# Patient Record
Sex: Female | Born: 1935
Health system: Southern US, Community
[De-identification: ages and names within clinical notes are randomized; demographics above are authoritative.]

## PROBLEM LIST (undated history)

## (undated) DIAGNOSIS — L0390 Cellulitis, unspecified: Secondary | ICD-10-CM

## (undated) DIAGNOSIS — A419 Sepsis, unspecified organism: Secondary | ICD-10-CM

## (undated) DIAGNOSIS — L0291 Cutaneous abscess, unspecified: Secondary | ICD-10-CM

## (undated) DIAGNOSIS — E039 Hypothyroidism, unspecified: Secondary | ICD-10-CM

## (undated) DIAGNOSIS — U071 COVID-19: Secondary | ICD-10-CM

## (undated) DIAGNOSIS — J449 Chronic obstructive pulmonary disease, unspecified: Secondary | ICD-10-CM

## (undated) DIAGNOSIS — H3530 Unspecified macular degeneration: Secondary | ICD-10-CM

## (undated) DIAGNOSIS — I2510 Atherosclerotic heart disease of native coronary artery without angina pectoris: Secondary | ICD-10-CM

## (undated) DIAGNOSIS — I509 Heart failure, unspecified: Secondary | ICD-10-CM

## (undated) DIAGNOSIS — E785 Hyperlipidemia, unspecified: Secondary | ICD-10-CM

## (undated) DIAGNOSIS — K219 Gastro-esophageal reflux disease without esophagitis: Secondary | ICD-10-CM

## (undated) DIAGNOSIS — N390 Urinary tract infection, site not specified: Secondary | ICD-10-CM

## (undated) DIAGNOSIS — I639 Cerebral infarction, unspecified: Secondary | ICD-10-CM

## (undated) DIAGNOSIS — J441 Chronic obstructive pulmonary disease with (acute) exacerbation: Secondary | ICD-10-CM

## (undated) HISTORY — DX: Heart failure, unspecified: I50.9

## (undated) HISTORY — PX: VARICOSE VEIN SURGERY: SHX832

## (undated) HISTORY — PX: CATARACT EXTRACTION: SUR2

## (undated) HISTORY — DX: Sepsis, unspecified organism: A41.9

## (undated) HISTORY — DX: COVID-19: U07.1

## (undated) HISTORY — DX: Urinary tract infection, site not specified: N39.0

## (undated) HISTORY — DX: Cellulitis, unspecified: L03.90

## (undated) HISTORY — DX: Cerebral infarction, unspecified: I63.9

## (undated) HISTORY — DX: Cutaneous abscess, unspecified: L02.91

## (undated) HISTORY — DX: Chronic obstructive pulmonary disease with (acute) exacerbation: J44.1

## (undated) HISTORY — DX: Chronic obstructive pulmonary disease, unspecified: J44.9

## (undated) HISTORY — DX: Hyperlipidemia, unspecified: E78.5

## (undated) HISTORY — DX: Gastro-esophageal reflux disease without esophagitis: K21.9

## (undated) HISTORY — DX: Unspecified macular degeneration: H35.30

## (undated) HISTORY — DX: Atherosclerotic heart disease of native coronary artery without angina pectoris: I25.10

## (undated) HISTORY — PX: DILATION AND CURETTAGE OF UTERUS: SHX78

## (undated) HISTORY — DX: Hypothyroidism, unspecified: E03.9

---

## 1998-06-10 ENCOUNTER — Emergency Department (HOSPITAL_COMMUNITY): Admission: EM | Admit: 1998-06-10 | Discharge: 1998-06-10 | Payer: Self-pay

## 2000-09-08 ENCOUNTER — Encounter: Payer: Self-pay | Admitting: Internal Medicine

## 2000-09-08 ENCOUNTER — Encounter: Admission: RE | Admit: 2000-09-08 | Discharge: 2000-09-08 | Payer: Self-pay | Admitting: Internal Medicine

## 2000-12-01 ENCOUNTER — Emergency Department (HOSPITAL_COMMUNITY): Admission: EM | Admit: 2000-12-01 | Discharge: 2000-12-01 | Payer: Self-pay | Admitting: Emergency Medicine

## 2000-12-01 ENCOUNTER — Encounter: Payer: Self-pay | Admitting: Emergency Medicine

## 2001-07-30 ENCOUNTER — Encounter: Payer: Self-pay | Admitting: Emergency Medicine

## 2001-07-30 ENCOUNTER — Emergency Department (HOSPITAL_COMMUNITY): Admission: EM | Admit: 2001-07-30 | Discharge: 2001-07-30 | Payer: Self-pay | Admitting: Emergency Medicine

## 2001-09-13 ENCOUNTER — Encounter: Admission: RE | Admit: 2001-09-13 | Discharge: 2001-09-13 | Payer: Self-pay | Admitting: Internal Medicine

## 2001-09-13 ENCOUNTER — Encounter: Payer: Self-pay | Admitting: Internal Medicine

## 2002-04-10 ENCOUNTER — Other Ambulatory Visit: Admission: RE | Admit: 2002-04-10 | Discharge: 2002-04-10 | Payer: Self-pay | Admitting: Internal Medicine

## 2003-05-19 ENCOUNTER — Inpatient Hospital Stay (HOSPITAL_COMMUNITY): Admission: EM | Admit: 2003-05-19 | Discharge: 2003-05-21 | Payer: Self-pay | Admitting: Emergency Medicine

## 2003-12-08 ENCOUNTER — Emergency Department (HOSPITAL_COMMUNITY): Admission: AD | Admit: 2003-12-08 | Discharge: 2003-12-08 | Payer: Self-pay | Admitting: Family Medicine

## 2004-10-20 ENCOUNTER — Ambulatory Visit: Payer: Self-pay | Admitting: Internal Medicine

## 2005-01-05 ENCOUNTER — Ambulatory Visit: Payer: Self-pay | Admitting: Internal Medicine

## 2005-02-28 ENCOUNTER — Emergency Department (HOSPITAL_COMMUNITY): Admission: EM | Admit: 2005-02-28 | Discharge: 2005-02-28 | Payer: Self-pay | Admitting: Family Medicine

## 2005-03-03 ENCOUNTER — Encounter: Admission: RE | Admit: 2005-03-03 | Discharge: 2005-03-03 | Payer: Self-pay | Admitting: Internal Medicine

## 2005-03-04 ENCOUNTER — Ambulatory Visit: Payer: Self-pay | Admitting: Family Medicine

## 2005-03-23 ENCOUNTER — Ambulatory Visit: Payer: Self-pay | Admitting: Internal Medicine

## 2005-04-13 ENCOUNTER — Ambulatory Visit: Payer: Self-pay | Admitting: Internal Medicine

## 2005-04-20 ENCOUNTER — Ambulatory Visit: Payer: Self-pay | Admitting: Internal Medicine

## 2005-06-08 ENCOUNTER — Ambulatory Visit: Payer: Self-pay | Admitting: Internal Medicine

## 2005-07-05 ENCOUNTER — Ambulatory Visit: Payer: Self-pay | Admitting: Internal Medicine

## 2005-10-02 ENCOUNTER — Ambulatory Visit: Payer: Self-pay | Admitting: Internal Medicine

## 2005-10-14 ENCOUNTER — Ambulatory Visit: Payer: Self-pay | Admitting: Internal Medicine

## 2005-11-10 ENCOUNTER — Ambulatory Visit: Payer: Self-pay | Admitting: Internal Medicine

## 2006-01-03 ENCOUNTER — Ambulatory Visit: Payer: Self-pay | Admitting: Internal Medicine

## 2006-01-03 ENCOUNTER — Other Ambulatory Visit: Admission: RE | Admit: 2006-01-03 | Discharge: 2006-01-03 | Payer: Self-pay | Admitting: Internal Medicine

## 2006-04-21 ENCOUNTER — Ambulatory Visit: Payer: Self-pay | Admitting: Internal Medicine

## 2006-07-11 ENCOUNTER — Ambulatory Visit: Payer: Self-pay | Admitting: Internal Medicine

## 2006-08-30 ENCOUNTER — Ambulatory Visit: Payer: Self-pay | Admitting: Internal Medicine

## 2007-04-14 ENCOUNTER — Emergency Department (HOSPITAL_COMMUNITY): Admission: EM | Admit: 2007-04-14 | Discharge: 2007-04-14 | Payer: Self-pay | Admitting: Emergency Medicine

## 2007-05-18 ENCOUNTER — Encounter: Payer: Self-pay | Admitting: Internal Medicine

## 2007-05-18 ENCOUNTER — Ambulatory Visit: Payer: Self-pay | Admitting: Internal Medicine

## 2007-05-18 DIAGNOSIS — E039 Hypothyroidism, unspecified: Secondary | ICD-10-CM

## 2007-05-18 DIAGNOSIS — J4489 Other specified chronic obstructive pulmonary disease: Secondary | ICD-10-CM

## 2007-05-18 DIAGNOSIS — J449 Chronic obstructive pulmonary disease, unspecified: Secondary | ICD-10-CM

## 2007-05-18 DIAGNOSIS — E785 Hyperlipidemia, unspecified: Secondary | ICD-10-CM

## 2007-05-18 HISTORY — DX: Hyperlipidemia, unspecified: E78.5

## 2007-05-18 HISTORY — DX: Other specified chronic obstructive pulmonary disease: J44.89

## 2007-05-18 HISTORY — DX: Chronic obstructive pulmonary disease, unspecified: J44.9

## 2007-05-18 HISTORY — DX: Hypothyroidism, unspecified: E03.9

## 2007-07-27 ENCOUNTER — Encounter: Payer: Self-pay | Admitting: Internal Medicine

## 2007-09-18 ENCOUNTER — Ambulatory Visit: Payer: Self-pay | Admitting: Internal Medicine

## 2007-11-09 ENCOUNTER — Telehealth: Payer: Self-pay | Admitting: Internal Medicine

## 2007-11-11 ENCOUNTER — Telehealth: Payer: Self-pay | Admitting: Family Medicine

## 2007-11-16 ENCOUNTER — Ambulatory Visit: Payer: Self-pay | Admitting: Internal Medicine

## 2007-11-16 DIAGNOSIS — J441 Chronic obstructive pulmonary disease with (acute) exacerbation: Secondary | ICD-10-CM

## 2007-11-16 HISTORY — DX: Chronic obstructive pulmonary disease with (acute) exacerbation: J44.1

## 2007-11-30 LAB — HM COLONOSCOPY

## 2007-12-15 ENCOUNTER — Ambulatory Visit: Payer: Self-pay | Admitting: Internal Medicine

## 2007-12-15 LAB — CONVERTED CEMR LAB
ALT: 13 units/L (ref 0–35)
AST: 21 units/L (ref 0–37)
Albumin: 3.6 g/dL (ref 3.5–5.2)
Alkaline Phosphatase: 78 units/L (ref 39–117)
BUN: 15 mg/dL (ref 6–23)
Basophils Absolute: 0 10*3/uL (ref 0.0–0.1)
Basophils Relative: 0.6 % (ref 0.0–1.0)
Bilirubin Urine: NEGATIVE
Bilirubin, Direct: 0.2 mg/dL (ref 0.0–0.3)
CO2: 29 meq/L (ref 19–32)
Calcium: 9.2 mg/dL (ref 8.4–10.5)
Chloride: 106 meq/L (ref 96–112)
Cholesterol: 149 mg/dL (ref 0–200)
Creatinine, Ser: 0.8 mg/dL (ref 0.4–1.2)
Eosinophils Absolute: 0.3 10*3/uL (ref 0.0–0.6)
Eosinophils Relative: 6.3 % — ABNORMAL HIGH (ref 0.0–5.0)
GFR calc Af Amer: 89 mL/min
GFR calc non Af Amer: 74 mL/min
Glucose, Bld: 88 mg/dL (ref 70–99)
Glucose, Urine, Semiquant: NEGATIVE
HCT: 38 % (ref 36.0–46.0)
HDL: 51.7 mg/dL (ref >39.0)
Hemoglobin: 13.3 g/dL (ref 12.0–15.0)
Ketones, urine, test strip: NEGATIVE
LDL Cholesterol: 81 mg/dL (ref 0–99)
Lymphocytes Relative: 34.5 % (ref 12.0–46.0)
MCHC: 34.9 g/dL (ref 30.0–36.0)
MCV: 92.1 fL (ref 78.0–100.0)
Monocytes Absolute: 0.5 10*3/uL (ref 0.2–0.7)
Monocytes Relative: 9.9 % (ref 3.0–11.0)
Neutro Abs: 2.8 10*3/uL (ref 1.4–7.7)
Neutrophils Relative %: 48.7 % (ref 43.0–77.0)
Nitrite: POSITIVE
Platelets: 216 10*3/uL (ref 150–400)
Potassium: 4.2 meq/L (ref 3.5–5.1)
RBC: 4.13 M/uL (ref 3.87–5.11)
RDW: 14.8 % — ABNORMAL HIGH (ref 11.5–14.6)
Sodium: 141 meq/L (ref 135–145)
Specific Gravity, Urine: 1.025
TSH: 5.23 microintl units/mL (ref 0.35–5.50)
Total Bilirubin: 1.1 mg/dL (ref 0.3–1.2)
Total CHOL/HDL Ratio: 2.9
Total Protein: 6.5 g/dL (ref 6.0–8.3)
Triglycerides: 80 mg/dL (ref 0–149)
Urobilinogen, UA: 0.2
VLDL: 16 mg/dL (ref 0–40)
WBC: 5.5 10*3/uL (ref 4.5–10.5)
pH: 6

## 2007-12-25 ENCOUNTER — Ambulatory Visit: Payer: Self-pay | Admitting: Internal Medicine

## 2007-12-25 DIAGNOSIS — K219 Gastro-esophageal reflux disease without esophagitis: Secondary | ICD-10-CM

## 2007-12-25 HISTORY — DX: Gastro-esophageal reflux disease without esophagitis: K21.9

## 2008-01-10 ENCOUNTER — Ambulatory Visit: Payer: Self-pay | Admitting: Gastroenterology

## 2008-01-24 ENCOUNTER — Ambulatory Visit: Payer: Self-pay | Admitting: Gastroenterology

## 2008-01-24 ENCOUNTER — Encounter: Payer: Self-pay | Admitting: Internal Medicine

## 2008-01-24 ENCOUNTER — Encounter: Payer: Self-pay | Admitting: Gastroenterology

## 2008-06-08 ENCOUNTER — Emergency Department (HOSPITAL_COMMUNITY): Admission: EM | Admit: 2008-06-08 | Discharge: 2008-06-09 | Payer: Self-pay | Admitting: Family Medicine

## 2008-06-10 ENCOUNTER — Telehealth (INDEPENDENT_AMBULATORY_CARE_PROVIDER_SITE_OTHER): Payer: Self-pay | Admitting: *Deleted

## 2008-06-11 ENCOUNTER — Ambulatory Visit: Payer: Self-pay | Admitting: Internal Medicine

## 2008-06-18 ENCOUNTER — Emergency Department (HOSPITAL_COMMUNITY): Admission: EM | Admit: 2008-06-18 | Discharge: 2008-06-18 | Payer: Self-pay | Admitting: Family Medicine

## 2008-06-18 ENCOUNTER — Ambulatory Visit: Payer: Self-pay | Admitting: Internal Medicine

## 2008-06-18 DIAGNOSIS — L0390 Cellulitis, unspecified: Secondary | ICD-10-CM

## 2008-06-18 DIAGNOSIS — L0291 Cutaneous abscess, unspecified: Secondary | ICD-10-CM | POA: Insufficient documentation

## 2008-06-18 HISTORY — DX: Cellulitis, unspecified: L03.90

## 2008-06-18 HISTORY — DX: Cutaneous abscess, unspecified: L02.91

## 2008-06-25 ENCOUNTER — Encounter: Payer: Self-pay | Admitting: Internal Medicine

## 2008-09-10 ENCOUNTER — Ambulatory Visit: Payer: Self-pay | Admitting: Internal Medicine

## 2008-10-14 ENCOUNTER — Ambulatory Visit: Payer: Self-pay | Admitting: Internal Medicine

## 2008-10-22 ENCOUNTER — Ambulatory Visit: Payer: Self-pay | Admitting: Internal Medicine

## 2009-02-12 ENCOUNTER — Ambulatory Visit: Payer: Self-pay | Admitting: Internal Medicine

## 2009-02-12 LAB — CONVERTED CEMR LAB
ALT: 15 units/L (ref 0–35)
AST: 27 units/L (ref 0–37)
Albumin: 3.8 g/dL (ref 3.5–5.2)
Alkaline Phosphatase: 80 units/L (ref 39–117)
BUN: 14 mg/dL (ref 6–23)
Basophils Absolute: 0 10*3/uL (ref 0.0–0.1)
Basophils Relative: 0.4 % (ref 0.0–3.0)
Bilirubin, Direct: 0.1 mg/dL (ref 0.0–0.3)
CO2: 28 meq/L (ref 19–32)
Calcium: 9.3 mg/dL (ref 8.4–10.5)
Chloride: 106 meq/L (ref 96–112)
Cholesterol: 148 mg/dL (ref 0–200)
Creatinine, Ser: 0.8 mg/dL (ref 0.4–1.2)
Eosinophils Absolute: 0.2 10*3/uL (ref 0.0–0.7)
Eosinophils Relative: 2.6 % (ref 0.0–5.0)
GFR calc non Af Amer: 73.52 mL/min (ref >60)
Glucose, Bld: 88 mg/dL (ref 70–99)
HCT: 40.9 % (ref 36.0–46.0)
HDL: 58.8 mg/dL (ref >39.00)
Hemoglobin: 13.9 g/dL (ref 12.0–15.0)
LDL Cholesterol: 74 mg/dL (ref 0–99)
Lymphocytes Relative: 21.3 % (ref 12.0–46.0)
Lymphs Abs: 1.4 10*3/uL (ref 0.7–4.0)
MCHC: 34.1 g/dL (ref 30.0–36.0)
MCV: 93.4 fL (ref 78.0–100.0)
Monocytes Absolute: 0.7 10*3/uL (ref 0.1–1.0)
Monocytes Relative: 10.3 % (ref 3.0–12.0)
Neutro Abs: 4.1 10*3/uL (ref 1.4–7.7)
Neutrophils Relative %: 65.4 % (ref 43.0–77.0)
Platelets: 205 10*3/uL (ref 150.0–400.0)
Potassium: 3.9 meq/L (ref 3.5–5.1)
RBC: 4.38 M/uL (ref 3.87–5.11)
RDW: 14.8 % — ABNORMAL HIGH (ref 11.5–14.6)
Sodium: 143 meq/L (ref 135–145)
TSH: 1.59 microintl units/mL (ref 0.35–5.50)
Total Bilirubin: 1.4 mg/dL — ABNORMAL HIGH (ref 0.3–1.2)
Total CHOL/HDL Ratio: 3
Total Protein: 7.1 g/dL (ref 6.0–8.3)
Triglycerides: 75 mg/dL (ref 0.0–149.0)
VLDL: 15 mg/dL (ref 0.0–40.0)
WBC: 6.4 10*3/uL (ref 4.5–10.5)

## 2009-05-16 ENCOUNTER — Ambulatory Visit: Payer: Self-pay | Admitting: Internal Medicine

## 2009-08-13 ENCOUNTER — Ambulatory Visit: Payer: Self-pay | Admitting: Internal Medicine

## 2009-09-10 ENCOUNTER — Ambulatory Visit: Payer: Self-pay | Admitting: Internal Medicine

## 2009-11-15 ENCOUNTER — Emergency Department (HOSPITAL_COMMUNITY): Admission: EM | Admit: 2009-11-15 | Discharge: 2009-11-15 | Payer: Self-pay | Admitting: Emergency Medicine

## 2009-12-08 ENCOUNTER — Telehealth: Payer: Self-pay | Admitting: Internal Medicine

## 2009-12-31 ENCOUNTER — Telehealth: Payer: Self-pay | Admitting: Internal Medicine

## 2010-02-13 ENCOUNTER — Ambulatory Visit: Payer: Self-pay | Admitting: Internal Medicine

## 2010-02-16 ENCOUNTER — Encounter: Payer: Self-pay | Admitting: Internal Medicine

## 2010-02-16 LAB — CONVERTED CEMR LAB
ALT: 15 units/L (ref 0–35)
AST: 24 units/L (ref 0–37)
Albumin: 3.9 g/dL (ref 3.5–5.2)
Alkaline Phosphatase: 94 units/L (ref 39–117)
BUN: 15 mg/dL (ref 6–23)
Basophils Absolute: 0 10*3/uL (ref 0.0–0.1)
Basophils Relative: 0.8 % (ref 0.0–3.0)
Bilirubin, Direct: 0.2 mg/dL (ref 0.0–0.3)
CO2: 29 meq/L (ref 19–32)
Calcium: 9.3 mg/dL (ref 8.4–10.5)
Chloride: 103 meq/L (ref 96–112)
Cholesterol: 143 mg/dL (ref 0–200)
Creatinine, Ser: 0.9 mg/dL (ref 0.4–1.2)
Eosinophils Absolute: 0.2 10*3/uL (ref 0.0–0.7)
Eosinophils Relative: 3 % (ref 0.0–5.0)
GFR calc non Af Amer: 64.01 mL/min (ref >60)
Glucose, Bld: 89 mg/dL (ref 70–99)
HCT: 40 % (ref 36.0–46.0)
HDL: 65.6 mg/dL (ref >39.00)
Hemoglobin: 12.9 g/dL (ref 12.0–15.0)
LDL Cholesterol: 59 mg/dL (ref 0–99)
Lymphocytes Relative: 28.3 % (ref 12.0–46.0)
Lymphs Abs: 1.5 10*3/uL (ref 0.7–4.0)
MCHC: 32.2 g/dL (ref 30.0–36.0)
MCV: 97.1 fL (ref 78.0–100.0)
Monocytes Absolute: 0.5 10*3/uL (ref 0.1–1.0)
Monocytes Relative: 9.2 % (ref 3.0–12.0)
Neutro Abs: 3.1 10*3/uL (ref 1.4–7.7)
Neutrophils Relative %: 58.7 % (ref 43.0–77.0)
Platelets: 298 10*3/uL (ref 150.0–400.0)
Potassium: 3.7 meq/L (ref 3.5–5.1)
RBC: 4.12 M/uL (ref 3.87–5.11)
RDW: 14.7 % — ABNORMAL HIGH (ref 11.5–14.6)
Sodium: 142 meq/L (ref 135–145)
TSH: 8.3 microintl units/mL — ABNORMAL HIGH (ref 0.35–5.50)
Total Bilirubin: 1 mg/dL (ref 0.3–1.2)
Total CHOL/HDL Ratio: 2
Total Protein: 7.3 g/dL (ref 6.0–8.3)
Triglycerides: 93 mg/dL (ref 0.0–149.0)
VLDL: 18.6 mg/dL (ref 0.0–40.0)
WBC: 5.3 10*3/uL (ref 4.5–10.5)

## 2010-08-14 ENCOUNTER — Telehealth: Payer: Self-pay | Admitting: Internal Medicine

## 2010-08-19 ENCOUNTER — Ambulatory Visit: Payer: Self-pay | Admitting: Internal Medicine

## 2010-09-25 ENCOUNTER — Telehealth: Payer: Self-pay | Admitting: Internal Medicine

## 2010-09-28 ENCOUNTER — Ambulatory Visit: Payer: Self-pay | Admitting: Internal Medicine

## 2010-12-29 NOTE — Assessment & Plan Note (Signed)
Summary: 4 months roa per pt router/nta   Vital Signs:  Patient Profile:   77 Years Old Female Weight:      152 pounds Temp:     98.1 degrees F oral BP sitting:   128 / 60  (left arm)  Vitals Entered By: [name], RN (September 18, 2007 10:28 AM)                 Chief Complaint:  ROV.Marland Kitchen  History of Present Illness: 77 year old patient seen today for follow-up of her hyperlipidemia, hypothyroidism, COPD  Current Allergies: LEVAQUIN (LEVOFLOXACIN) SULFAMETHOXAZOLE (SULFAMETHOXAZOLE)  Past Medical History:    COPD    Hyperlipidemia    Hypothyroidism    Urosepsis 2004    macular degeneration  Past Surgical History:    D&C    Vein stripping    G4 P4 A0   Family History:    father died age 3 in my    mother died 69 gynecologic cancer    4 brothers two sisters    positive for colon cancer, coronary artery  disease, scleroderma    Review of Systems       denies   Physical Exam  General:     Well-developed,well-nourished,in no acute distress; alert,appropriate and cooperative throughout examination Head:     Normocephalic and atraumatic without obvious abnormalities. No apparent alopecia or balding. Mouth:     Oral mucosa and oropharynx without lesions or exudates.  Teeth in good repair. Neck:     No deformities, masses, or tenderness noted. Lungs:     Normal respiratory effort, chest expands symmetrically. Lungs are clear to auscultation, no crackles or wheezes. Heart:     Normal rate and regular rhythm. S1 and S2 normal without gallop, murmur, click, rub or other extra sounds. Abdomen:     Bowel sounds positive,abdomen soft and non-tender without masses, organomegaly or hernias noted. Msk:     No deformity or scoliosis noted of thoracic or lumbar spine.   Pulses:     R and L carotid,radial,femoral,dorsalis pedis and posterior tibial pulses are full and equal bilaterally Extremities:     No clubbing, cyanosis, edema, or deformity noted with normal  full range of motion of all joints.      Impression & Recommendations:  Problem # 1:  COPD (ICD-496)  Her updated medication list for this problem includes:    Advair Diskus 500-50 Mcg/dose Misc (Fluticasone-salmeterol) ..... Inhale 1 puff as directed twice a day   Problem # 2:  HYPOTHYROIDISM (ICD-244.9)  Her updated medication list for this problem includes:    Synthroid 100 Mcg Tabs (Levothyroxine sodium) .Marland Kitchen... 1 once daily  lot [id number], EXP 30 jun 09, sanofi pasteur left deltoid IM, 0.5 cc.   Complete Medication List: 1)  Advair Diskus 500-50 Mcg/dose Misc (Fluticasone-salmeterol) .... Inhale 1 puff as directed twice a day 2)  Aspirin 81 Mg Chew (Aspirin) .... Take 1 tablet by mouth once a day 3)  Lipitor 10 Mg Tabs (Atorvastatin calcium) .... Take 1 tablet by mouth every night 4)  Synthroid 100 Mcg Tabs (Levothyroxine sodium) .Marland Kitchen.. 1 once daily  Other Orders: Influenza Vaccine MCR (16109)   Patient Instructions: 1)  Please schedule a follow-up appointment in 4 months. 2)  It is important that you exercise regularly at least 20 minutes 5 times a week. If you develop chest pain, have severe difficulty breathing, or feel very tired , stop exercising immediately and seek medical attention.    Prescriptions: SYNTHROID 100  MCG TABS (LEVOTHYROXINE SODIUM) 1 once daily  #90 x 6   Entered and Authorized by:   Gordy Savers  MD   Signed by:   Gordy Savers  MD on 09/18/2007   Method used:   Print then Give to Patient   RxID:   [id number] LIPITOR 10 MG TABS (ATORVASTATIN CALCIUM) Take 1 tablet by mouth every night  #90 x 6   Entered and Authorized by:   Gordy Savers  MD   Signed by:   Gordy Savers  MD on 09/18/2007   Method used:   Print then Give to Patient   RxID:   [id number] ADVAIR DISKUS 500-50 MCG/DOSE MISC (FLUTICASONE-SALMETEROL) Inhale 1 puff as directed twice a day  #3 x 6   Entered and Authorized by:   Gordy Savers   MD   Signed by:   Gordy Savers  MD on 09/18/2007   Method used:   Print then Give to Patient   RxID:   [id number]  ]  Influenza Vaccine    Vaccine Type: Fluvax MCR    Given by: [name], RN  Flu Vaccine Consent Questions    Do you have a history of severe allergic reactions to this vaccine? no    Any prior history of allergic reactions to egg and/or gelatin? no    Do you have a sensitivity to the preservative Thimersol? no    Do you have a past history of Guillan-Barre Syndrome? no    Do you currently have an acute febrile illness? no    Have you ever had a severe reaction to latex? no    Vaccine information given and explained to patient? yes    Are you currently pregnant? no

## 2010-12-29 NOTE — Consult Note (Signed)
Summary: [facility]  [facility]   Imported By: Maryln Gottron 07/03/2008 12:43:51  _____________________________________________________________________  External Attachment:    Type:   Image     Comment:   External Document

## 2010-12-29 NOTE — Assessment & Plan Note (Signed)
Summary: 6 month rov/njr   Vital Signs:  Patient profile:   78 year old female Weight:      153 pounds Temp:     97.9 degrees F oral BP sitting:   136 / 80  (left arm) Cuff size:   regular  Vitals Entered By: [name], RN (August 13, 2009 8:40 AM) CC: 6 mo ROV.   CC:  6 mo ROV.Marland Kitchen  History of Present Illness: 77 year old patient seen today for follow-up.  She has a history of COPD, dyslipidemia, hypothyroidism.  In doing quite well today.  Since her last visit here.  She has had cataract extraction surgery.  She has been pleased with the results.  Her pulmonary  status has been stable.  Laboratory studies were done earlier this year.  Allergies: 1)  Levaquin (Levofloxacin) 2)  Sulfamethoxazole (Sulfamethoxazole)  Past History:  Past Medical History: Reviewed history from 09/18/2007 and no changes required. COPD Hyperlipidemia Hypothyroidism Urosepsis 2004 macular degeneration  Past Surgical History: D&C Vein stripping G4 P4 A0 sigmoidoscopy 2001 s/p cataract OD  colonoscopy 2009  Review of Systems  The patient denies anorexia, fever, weight loss, weight gain, vision loss, decreased hearing, hoarseness, chest pain, syncope, dyspnea on exertion, peripheral edema, prolonged cough, headaches, hemoptysis, abdominal pain, melena, hematochezia, severe indigestion/heartburn, hematuria, incontinence, genital sores, muscle weakness, suspicious skin lesions, transient blindness, difficulty walking, depression, unusual weight change, abnormal bleeding, enlarged lymph nodes, angioedema, and breast masses.    Physical Exam  General:  Well-developed,well-nourished,in no acute distress; alert,appropriate and cooperative throughout examination Head:  Normocephalic and atraumatic without obvious abnormalities. No apparent alopecia or balding. Eyes:  No corneal or conjunctival inflammation noted. EOMI. Perrla. Funduscopic exam benign, without hemorrhages, exudates or  papilledema. Vision grossly normal. Mouth:  Oral mucosa and oropharynx without lesions or exudates.  Teeth in good repair. Neck:  No deformities, masses, or tenderness noted. Lungs:  Normal respiratory effort, chest expands symmetrically. Lungs are clear to auscultation, no crackles or wheezes. Heart:  Normal rate and regular rhythm. S1 and S2 normal without gallop, murmur, click, rub or other extra sounds. Abdomen:  Bowel sounds positive,abdomen soft and non-tender without masses, organomegaly or hernias noted. Msk:  No deformity or scoliosis noted of thoracic or lumbar spine.   Extremities:  No clubbing, cyanosis, edema, or deformity noted with normal full range of motion of all joints.   Skin:  Intact without suspicious lesions or rashes Cervical Nodes:  No lymphadenopathy noted   Impression & Recommendations:  Problem # 1:  HYPOTHYROIDISM (ICD-244.9)  Her updated medication list for this problem includes:    Synthroid 100 Mcg Tabs (Levothyroxine sodium) .Marland Kitchen... 1 once daily  Her updated medication list for this problem includes:    Synthroid 100 Mcg Tabs (Levothyroxine sodium) .Marland Kitchen... 1 once daily  Problem # 2:  HYPERLIPIDEMIA (ICD-272.4)  Her updated medication list for this problem includes:    Lipitor 10 Mg Tabs (Atorvastatin calcium) .Marland Kitchen... Take 1 tablet by mouth every night  Her updated medication list for this problem includes:    Lipitor 10 Mg Tabs (Atorvastatin calcium) .Marland Kitchen... Take 1 tablet by mouth every night  Complete Medication List: 1)  Advair Diskus 500-50 Mcg/dose Misc (Fluticasone-salmeterol) .... Inhale 1 puff as directed twice a day 2)  Aspirin 81 Mg Chew (Aspirin) .... Take 1 tablet by mouth once a day 3)  Lipitor 10 Mg Tabs (Atorvastatin calcium) .... Take 1 tablet by mouth every night 4)  Synthroid 100 Mcg Tabs (Levothyroxine sodium) .Marland KitchenMarland KitchenMarland Kitchen  1 once daily 5)  Pantoprazole Sodium 40 Mg Tbec (Pantoprazole sodium) .... One daily 6)  Proventil Hfa 108 (90 Base) Mcg/act  Aers (Albuterol sulfate) .... Two puffs every 6 hours as needed for shortness of breath 7)  Mycelex 10 Mg Troc (Clotrimazole) .... Use 4 times daily 8)  Avelox 400 Mg Tabs (Moxifloxacin hcl) .... One daily for 5 days  Patient Instructions: 1)  Please schedule a follow-up appointment in 6 months. 2)  Advised not to eat any food or drink any liquids after 10 PM the night before your procedure. 3)  Limit your Sodium (Salt) to less than 2 grams a day(slightly less than 1/2 a teaspoon) to prevent fluid retention, swelling, or worsening of symptoms. 4)  Avoid foods high in acid (tomatoes, citrus juices, spicy foods). Avoid eating within two hours of lying down or before exercising. Do not over eat; try smaller more frequent meals. Elevate head of bed twelve inches when sleeping. 5)  It is important that you exercise regularly at least 20 minutes 5 times a week. If you develop chest pain, have severe difficulty breathing, or feel very tired , stop exercising immediately and seek medical attention. Prescriptions: PROVENTIL HFA 108 (90 BASE) MCG/ACT AERS (ALBUTEROL SULFATE) two puffs every 6 hours as needed for shortness of breath  #3 x 5   Entered and Authorized by:   Gordy Savers  MD   Signed by:   Gordy Savers  MD on 08/13/2009   Method used:   Print then Give to Patient   RxID:   [id number] PANTOPRAZOLE SODIUM 40 MG  TBEC (PANTOPRAZOLE SODIUM) one daily  #90 x 6   Entered and Authorized by:   Gordy Savers  MD   Signed by:   Gordy Savers  MD on 08/13/2009   Method used:   Print then Give to Patient   RxID:   [id number] SYNTHROID 100 MCG TABS (LEVOTHYROXINE SODIUM) 1 once daily  #90 x 6   Entered and Authorized by:   Gordy Savers  MD   Signed by:   Gordy Savers  MD on 08/13/2009   Method used:   Print then Give to Patient   RxID:   [id number] LIPITOR 10 MG TABS (ATORVASTATIN CALCIUM) Take 1 tablet by mouth every night  #90 x 6   Entered  and Authorized by:   Gordy Savers  MD   Signed by:   Gordy Savers  MD on 08/13/2009   Method used:   Print then Give to Patient   RxID:   [id number] ADVAIR DISKUS 500-50 MCG/DOSE MISC (FLUTICASONE-SALMETEROL) Inhale 1 puff as directed twice a day  #3 x 6   Entered and Authorized by:   Gordy Savers  MD   Signed by:   Gordy Savers  MD on 08/13/2009   Method used:   Print then Give to Patient   RxID:   [id number]

## 2010-12-29 NOTE — Assessment & Plan Note (Signed)
Summary: cough/congestion/njr   Vital Signs:  Patient profile:   77 year old female Weight:      152 pounds Temp:     99 degrees F oral Pulse rate:   80 / minute Pulse rhythm:   regular BP sitting:   130 / 84  (left arm) Cuff size:   regular  Vitals Entered By: [name], RN (May 16, 2009 12:48 PM)  CC:  C/o sick since Monday with cold, tired, and productive cough- mucous green and bloody.Holly [name]  History of Present Illness: 77 year old patient who has a history of COPD. for the past 5 days, she has had increasing chest congestion and productive cough.  For the past two days.  Sputum has been blood-tinged, as well as green.  Denies any chest pain, shortness of breath or wheezing.  She states she has not had to use any rescue albuterol this week.  There's been no weight loss.  Review of her EMR reveals a modest weight reduction over the past two visits  Problems Prior to Update: 1)  Cellulitis  (ICD-682.9) 2)  Gerd  (ICD-530.81) 3)  Preventive Health Care  (ICD-V70.0) 4)  Chronic Obstructive Pulmonary Disease, Acute Exacerbation  (ICD-491.21) 5)  Hypothyroidism  (ICD-244.9) 6)  Hyperlipidemia  (ICD-272.4) 7)  COPD  (ICD-496)  Medications Prior to Update: 1)  Advair Diskus 500-50 Mcg/dose Misc (Fluticasone-Salmeterol) .... Inhale 1 Puff As Directed Twice A Day 2)  Aspirin 81 Mg Chew (Aspirin) .... Take 1 Tablet By Mouth Once A Day 3)  Lipitor 10 Mg Tabs (Atorvastatin Calcium) .... Take 1 Tablet By Mouth Every Night 4)  Synthroid 100 Mcg Tabs (Levothyroxine Sodium) .Holly [name].. 1 Once Daily 5)  Pantoprazole Sodium 40 Mg  Tbec (Pantoprazole Sodium) .... One Daily 6)  Proventil Hfa 108 (90 Base) Mcg/act Aers (Albuterol Sulfate) .... Two Puffs Every 6 Hours As Needed For Shortness of Breath 7)  Mycelex 10 Mg Troc (Clotrimazole) .... Use 4 Times Daily  Allergies: 1)  Levaquin (Levofloxacin) 2)  Sulfamethoxazole (Sulfamethoxazole)  Past History:  Past Medical History: Reviewed history  from 09/18/2007 and no changes required. COPD Hyperlipidemia Hypothyroidism Urosepsis 2004 macular degeneration  Family History: Reviewed history from 09/18/2007 and no changes required. father died age 87 in my mother died 36 gynecologic cancer 4 brothers two sisters positive for colon cancer, coronary artery  disease, scleroderma  Social History: Married d/c tobacco 1987  Review of Systems       The patient complains of prolonged cough and hemoptysis.  The patient denies anorexia, fever, weight loss, weight gain, vision loss, decreased hearing, hoarseness, chest pain, syncope, dyspnea on exertion, peripheral edema, headaches, abdominal pain, melena, hematochezia, severe indigestion/heartburn, hematuria, incontinence, genital sores, muscle weakness, suspicious skin lesions, transient blindness, difficulty walking, depression, unusual weight change, abnormal bleeding, enlarged lymph nodes, angioedema, and breast masses.    Physical Exam  General:  elderly,  alert, no distress Head:  Normocephalic and atraumatic without obvious abnormalities. No apparent alopecia or balding. Eyes:  No corneal or conjunctival inflammation noted. EOMI. Perrla. Funduscopic exam benign, without hemorrhages, exudates or papilledema. Vision grossly normal. Mouth:  Oral mucosa and oropharynx without lesions or exudates.  Teeth in good repair. Neck:  No deformities, masses, or tenderness noted. Chest Wall:  No deformities, masses, or tenderness noted. Lungs:  Normal respiratory effort, chest expands symmetrically. Lungs are clear to auscultation, no crackles or wheezes. Heart:  Normal rate and regular rhythm. S1 and S2 normal without gallop, murmur, click, rub or other  extra sounds. Abdomen:  Bowel sounds positive,abdomen soft and non-tender without masses, organomegaly or hernias noted. Pulses:  R and L carotid,radial,femoral,dorsalis pedis and posterior tibial pulses are full and equal  bilaterally Extremities:  No clubbing, cyanosis, edema, or deformity noted with normal full range of motion of all joints.     Impression & Recommendations:  Problem # 1:  BRONCHITIS-ACUTE (ICD-466.0)  Her updated medication list for this problem includes:    Advair Diskus 500-50 Mcg/dose Misc (Fluticasone-salmeterol) ..... Inhale 1 puff as directed twice a day    Proventil Hfa 108 (90 Base) Mcg/act Aers (Albuterol sulfate) .Holly [name]..Holly [name] Two puffs every 6 hours as needed for shortness of breath    Avelox 400 Mg Tabs (Moxifloxacin hcl) ..... One daily for 5 days patient has bronchitis in the setting of COPD; mild hemoptysis is probably secondary to the bronchitis.  Will check a chest x-ray  Orders: T-2 View CXR (71020TC)  Problem # 2:  CHRONIC OBSTRUCTIVE PULMONARY DISEASE, ACUTE EXACERBATION (ICD-491.21)  Complete Medication List: 1)  Advair Diskus 500-50 Mcg/dose Misc (Fluticasone-salmeterol) .... Inhale 1 puff as directed twice a day 2)  Aspirin 81 Mg Chew (Aspirin) .... Take 1 tablet by mouth once a day 3)  Lipitor 10 Mg Tabs (Atorvastatin calcium) .... Take 1 tablet by mouth every night 4)  Synthroid 100 Mcg Tabs (Levothyroxine sodium) .Holly [name].. 1 once daily 5)  Pantoprazole Sodium 40 Mg Tbec (Pantoprazole sodium) .... One daily 6)  Proventil Hfa 108 (90 Base) Mcg/act Aers (Albuterol sulfate) .... Two puffs every 6 hours as needed for shortness of breath 7)  Mycelex 10 Mg Troc (Clotrimazole) .... Use 4 times daily 8)  Avelox 400 Mg Tabs (Moxifloxacin hcl) .... One daily for 5 days  Patient Instructions: 1)  Avelox 400 mg daily for 5 days 2)  chest x-ray, as scheduled 3)  Call if he develops chest pain, shortness of breath or worsening sputum production

## 2010-12-29 NOTE — Assessment & Plan Note (Signed)
   Vital Signs:  Patient Profile:   77 Years Old Female Weight:      149 pounds BP supine:   130 / 78               History of Present Illness: 77 y/o h/o  COPD Hypothyroidism Hypercholesterolemia  Accompanied by gdaughter- no co,s except some mild LBP which seems to be improvng Needs meds refill        Physical Exam  General:     Well-developed,well-nourished,in no acute distress; alert,appropriate and cooperative throughout examination Eyes:     No corneal or conjunctival inflammation noted. EOMI. Perrla. Funduscopic exam benign, without hemorrhages, exudates or papilledema. Vision grossly normal. Mouth:     Oral mucosa and oropharynx without lesions or exudates.  Teeth in good repair. Neck:     No deformities, masses, or tenderness noted. Lungs:     Normal respiratory effort, chest expands symmetrically. Lungs are clear to auscultation, no crackles or wheezes. Heart:     Normal rate and regular rhythm. S1 and S2 normal without gallop, murmur, click, rub or other extra sounds. Abdomen:     Bowel sounds positive,abdomen soft and non-tender without masses, organomegaly or hernias noted. Msk:     No deformity or scoliosis noted of thoracic or lumbar spine.   Extremities:     No clubbing, cyanosis, edema, or deformity noted with normal full range of motion of all joints.      Impression & Recommendations:  Problem # 1:  COPD-stable Assessment: Unchanged  Problem # 2:  Hypercholesterolemia Assessment: Unchanged  Problem # 3:  Hypothyroidism Assessment: Unchanged   Patient Instructions: 1)  Please schedule a follow-up appointment in 3 months for CPX, lab and flu vac

## 2010-12-29 NOTE — Assessment & Plan Note (Signed)
Summary: m30/mm   Vital Signs:  Patient profile:   77 year old female Height:      61.5 inches Weight:      155 pounds BMI:     28.92 Temp:     98.1 degrees F oral BP sitting:   118 / 88  (left arm) Cuff size:   regular  Vitals Entered By: [name], RN (February 12, 2009 9:42 AM) CC: Abdominal Pain   History of Present Illness: 77 year old patient seen today for a comprehensive evaluation.  The compounds include COPD she has hypothyroidism, dyslipidemia, she also has a history of gastroesophageal reflux disease.  She is known well to the has had some difficulty with her mouth, and some mild changes in her bowel habits.  She did have a colonoscopy performed last year.  Her pulmonary status has been stable  Dyspepsia History:      There is a prior history of GERD.     Problems Prior to Update: 1)  Cellulitis  (ICD-682.9) 2)  Gerd  (ICD-530.81) 3)  Preventive Health Care  (ICD-V70.0) 4)  Chronic Obstructive Pulmonary Disease, Acute Exacerbation  (ICD-491.21) 5)  Hypothyroidism  (ICD-244.9) 6)  Hyperlipidemia  (ICD-272.4) 7)  COPD  (ICD-496)  Medications Prior to Update: 1)  Advair Diskus 500-50 Mcg/dose Misc (Fluticasone-Salmeterol) .... Inhale 1 Puff As Directed Twice A Day 2)  Aspirin 81 Mg Chew (Aspirin) .... Take 1 Tablet By Mouth Once A Day 3)  Lipitor 10 Mg Tabs (Atorvastatin Calcium) .... Take 1 Tablet By Mouth Every Night 4)  Synthroid 100 Mcg Tabs (Levothyroxine Sodium) .Marland Kitchen.. 1 Once Daily 5)  Guiafen/hydrocodone Elixir 100/5 .Marland Kitchen.. 1-2 Teaspoonfuls Every 6 Hours For Cough 6)  Pantoprazole Sodium 40 Mg  Tbec (Pantoprazole Sodium) .... One Daily 7)  Cephalexin 500 Mg  Caps (Cephalexin) .... One Tablet 3 Times Daily 8)  Proventil Hfa 108 (90 Base) Mcg/act Aers (Albuterol Sulfate) .... Two Puffs Every 6 Hours As Needed For Shortness of Breath 9)  Doxycycline Monohydrate 100 Mg Tabs (Doxycycline Monohydrate) .... One Tablet Twice Daily  Allergies: 1)  Levaquin  (Levofloxacin) 2)  Sulfamethoxazole (Sulfamethoxazole)  Past History:  Family History:    father died age 45 in my    mother died 45 gynecologic cancer    4 brothers two sisters    positive for colon cancer, coronary artery  disease, scleroderma (09/18/2007)  Risk Factors:    Alcohol Use: N/A    >5 drinks/d w/in last 3 months: N/A    Caffeine Use: N/A    Diet: N/A    Exercise: N/A  Past Medical History:    Reviewed history from 09/18/2007 and no changes required:    COPD    Hyperlipidemia    Hypothyroidism    Urosepsis 2004    macular degeneration  Past Surgical History:    D&C    Vein stripping    G4 P4 A0    sigmoidoscopy 2001        colonoscopy 2009  Family History:    Reviewed history from 09/18/2007 and no changes required:       father died age 65 in my       mother died 68 gynecologic cancer       4 brothers two sisters       positive for colon cancer, coronary artery  disease, scleroderma  Social History:    Reviewed history from 12/25/2007 and no changes required:       Married  Review of  Systems  The patient denies anorexia, fever, weight loss, weight gain, vision loss, decreased hearing, hoarseness, chest pain, syncope, dyspnea on exertion, peripheral edema, prolonged cough, headaches, hemoptysis, abdominal pain, melena, hematochezia, severe indigestion/heartburn, hematuria, incontinence, genital sores, muscle weakness, suspicious skin lesions, transient blindness, difficulty walking, depression, unusual weight change, abnormal bleeding, enlarged lymph nodes, angioedema, and breast masses.    Physical Exam  General:  Well-developed,well-nourished,in no acute distress; alert,appropriate and cooperative throughout examination Head:  Normocephalic and atraumatic without obvious abnormalities. No apparent alopecia or balding. Eyes:  No corneal or conjunctival inflammation noted. EOMI. Perrla. Funduscopic exam benign, without hemorrhages, exudates or  papilledema. Vision grossly normal. Ears:  External ear exam shows no significant lesions or deformities.  Otoscopic examination reveals clear canals, tympanic membranes are intact bilaterally without bulging, retraction, inflammation or discharge. Hearing is grossly normal bilaterally. Mouth:  white plaque(s).   Neck:  No deformities, masses, or tenderness noted. Chest Wall:  No deformities, masses, or tenderness noted. Breasts:  No mass, nodules, thickening, tenderness, bulging, retraction, inflamation, nipple discharge or skin changes noted.   Lungs:  Normal respiratory effort, chest expands symmetrically. Lungs are clear to auscultation, no crackles or wheezes. Heart:  Normal rate and regular rhythm. S1 and S2 normal without gallop, murmur, click, rub or other extra sounds. Abdomen:  Bowel sounds positive,abdomen soft and non-tender without masses, organomegaly or hernias noted. Genitalia:  Normal introitus for age, no external lesions, no vaginal discharge, mucosa pink and moist, no vaginal or cervical lesions, no vaginal atrophy, no friaility or hemorrhage, normal uterus size and position, no adnexal masses or tenderness Msk:  No deformity or scoliosis noted of thoracic or lumbar spine.   Pulses:  R and L carotid,radial,femoral,dorsalis pedis and posterior tibial pulses are full and equal bilaterally Extremities:  No clubbing, cyanosis, edema, or deformity noted with normal full range of motion of all joints.   Neurologic:  No cranial nerve deficits noted. Station and gait are normal. Plantar reflexes are down-going bilaterally. DTRs are symmetrical throughout. Sensory, motor and coordinative functions appear intact. Skin:  Intact without suspicious lesions or rashes Cervical Nodes:  No lymphadenopathy noted Axillary Nodes:  No palpable lymphadenopathy Inguinal Nodes:  No significant adenopathy Psych:  Cognition and judgment appear intact. Alert and cooperative with normal attention span and  concentration. No apparent delusions, illusions, hallucinations   Impression & Recommendations:  Problem # 1:  GERD (ICD-530.81)  Her updated medication list for this problem includes:    Pantoprazole Sodium 40 Mg Tbec (Pantoprazole sodium) ..... One daily  Orders: Venipuncture (16109) TLB-Lipid Panel (80061-LIPID) TLB-BMP (Basic Metabolic Panel-BMET) (80048-METABOL) TLB-Hepatic/Liver Function Pnl (80076-HEPATIC) TLB-TSH (Thyroid Stimulating Hormone) (84443-TSH) TLB-CBC Platelet - w/Differential (85025-CBCD)  Problem # 2:  CHRONIC OBSTRUCTIVE PULMONARY DISEASE, ACUTE EXACERBATION (ICD-491.21)  Orders: Venipuncture (60454) TLB-Lipid Panel (80061-LIPID) TLB-BMP (Basic Metabolic Panel-BMET) (80048-METABOL) TLB-Hepatic/Liver Function Pnl (80076-HEPATIC) TLB-TSH (Thyroid Stimulating Hormone) (84443-TSH) TLB-CBC Platelet - w/Differential (85025-CBCD)  Problem # 3:  HYPOTHYROIDISM (ICD-244.9)  Her updated medication list for this problem includes:    Synthroid 100 Mcg Tabs (Levothyroxine sodium) .Marland Kitchen... 1 once daily  Orders: Venipuncture (09811) TLB-Lipid Panel (80061-LIPID) TLB-BMP (Basic Metabolic Panel-BMET) (80048-METABOL) TLB-Hepatic/Liver Function Pnl (80076-HEPATIC) TLB-TSH (Thyroid Stimulating Hormone) (84443-TSH) TLB-CBC Platelet - w/Differential (85025-CBCD)  Problem # 4:  HYPERLIPIDEMIA (ICD-272.4)  Her updated medication list for this problem includes:    Lipitor 10 Mg Tabs (Atorvastatin calcium) .Marland Kitchen... Take 1 tablet by mouth every night  Orders: Venipuncture (91478) TLB-Lipid Panel (80061-LIPID) TLB-BMP (Basic Metabolic Panel-BMET) (80048-METABOL) TLB-Hepatic/Liver  Function Pnl (80076-HEPATIC) TLB-TSH (Thyroid Stimulating Hormone) (84443-TSH) TLB-CBC Platelet - w/Differential (85025-CBCD)  Complete Medication List: 1)  Advair Diskus 500-50 Mcg/dose Misc (Fluticasone-salmeterol) .... Inhale 1 puff as directed twice a day 2)  Aspirin 81 Mg Chew (Aspirin) ....  Take 1 tablet by mouth once a day 3)  Lipitor 10 Mg Tabs (Atorvastatin calcium) .... Take 1 tablet by mouth every night 4)  Synthroid 100 Mcg Tabs (Levothyroxine sodium) .Marland Kitchen.. 1 once daily 5)  Pantoprazole Sodium 40 Mg Tbec (Pantoprazole sodium) .... One daily 6)  Proventil Hfa 108 (90 Base) Mcg/act Aers (Albuterol sulfate) .... Two puffs every 6 hours as needed for shortness of breath 7)  Mycelex 10 Mg Troc (Clotrimazole) .... Use 4 times daily  Other Orders: EKG w/ Interpretation (93000)  Patient Instructions: 1)  Please schedule a follow-up appointment in 6 months. 2)  Limit your Sodium (Salt). 3)  It is important that you exercise regularly at least 20 minutes 5 times a week. If you develop chest pain, have severe difficulty breathing, or feel very tired , stop exercising immediately and seek medical attention. 4)  Take calcium +Vitamin D daily. Prescriptions: MYCELEX 10 MG TROC (CLOTRIMAZOLE) use 4 times daily  #40 x 0   Entered and Authorized by:   Gordy Savers  MD   Signed by:   Gordy Savers  MD on 02/12/2009   Method used:   Print then Give to Patient   RxID:   [id number] PROVENTIL HFA 108 (90 BASE) MCG/ACT AERS (ALBUTEROL SULFATE) two puffs every 6 hours as needed for shortness of breath  #3 x 5   Entered and Authorized by:   Gordy Savers  MD   Signed by:   Gordy Savers  MD on 02/12/2009   Method used:   Print then Give to Patient   RxID:   [id number] PANTOPRAZOLE SODIUM 40 MG  TBEC (PANTOPRAZOLE SODIUM) one daily  #90 x 6   Entered and Authorized by:   Gordy Savers  MD   Signed by:   Gordy Savers  MD on 02/12/2009   Method used:   Print then Give to Patient   RxID:   [id number] SYNTHROID 100 MCG TABS (LEVOTHYROXINE SODIUM) 1 once daily  #90 x 6   Entered and Authorized by:   Gordy Savers  MD   Signed by:   Gordy Savers  MD on 02/12/2009   Method used:   Print then Give to Patient   RxID:    [id number] LIPITOR 10 MG TABS (ATORVASTATIN CALCIUM) Take 1 tablet by mouth every night  #90 x 6   Entered and Authorized by:   Gordy Savers  MD   Signed by:   Gordy Savers  MD on 02/12/2009   Method used:   Print then Give to Patient   RxID:   [id number] ADVAIR DISKUS 500-50 MCG/DOSE MISC (FLUTICASONE-SALMETEROL) Inhale 1 puff as directed twice a day  #3 x 6   Entered and Authorized by:   Gordy Savers  MD   Signed by:   Gordy Savers  MD on 02/12/2009   Method used:   Print then Give to Patient   RxID:   [id number]

## 2010-12-29 NOTE — Assessment & Plan Note (Signed)
Summary: [name]   Vital Signs:  Patient profile:   77 year old female Weight:      151 pounds Temp:     98.1 degrees F oral BP sitting:   122 / 70  (left arm) Cuff size:   regular  Vitals Entered By: Alfred Levins, CMA (September 28, 2010 8:55 AM) CC: cough x1 wk   CC:  cough x1 wk.  History of Present Illness:  77 year old patient who has a history of COPD.  She presents with a 6-day history of worsening cough, which has been productive of green sputum.  She has had some mild increasing shortness of breath, but no active wheezing.  Mitis.  Medication includes both Advair and Proventil.  She has dyslipidemia and treated hypothyroidism  Current Medications (verified): 1)  Advair Diskus 500-50 Mcg/dose Misc (Fluticasone-Salmeterol) .... Inhale 1 Puff As Directed Twice A Day 2)  Aspirin 81 Mg Chew (Aspirin) .... Take 1 Tablet By Mouth Once A Day 3)  Lipitor 10 Mg Tabs (Atorvastatin Calcium) .... Take 1 Tablet By Mouth Every Night 4)  Synthroid 112 Mcg Tabs (Levothyroxine Sodium) .... Qd 5)  Pantoprazole Sodium 40 Mg  Tbec (Pantoprazole Sodium) .... One Daily 6)  Proventil Hfa 108 (90 Base) Mcg/act Aers (Albuterol Sulfate) .... Two Puffs Every 6 Hours As Needed For Shortness of Breath  Allergies (verified): 1)  Levaquin (Levofloxacin) 2)  Sulfamethoxazole (Sulfamethoxazole)  Past History:  Past Medical History: Reviewed history from 02/13/2010 and no changes required. COPD Hyperlipidemia Hypothyroidism Urosepsis 2004 macular degeneration Coronary artery disease  Review of Systems       The patient complains of anorexia, fever, hoarseness, and prolonged cough.  The patient denies weight loss, weight gain, vision loss, decreased hearing, chest pain, syncope, dyspnea on exertion, peripheral edema, headaches, hemoptysis, abdominal pain, melena, hematochezia, severe indigestion/heartburn, hematuria, incontinence, genital sores, muscle weakness, suspicious skin lesions,  transient blindness, difficulty walking, depression, unusual weight change, abnormal bleeding, enlarged lymph nodes, angioedema, and breast masses.    Physical Exam  General:  Well-developed,well-nourished,in no acute distress; alert,appropriate and cooperative throughout examination Head:  Normocephalic and atraumatic without obvious abnormalities. No apparent alopecia or balding. Eyes:  No corneal or conjunctival inflammation noted. EOMI. Perrla. Funduscopic exam benign, without hemorrhages, exudates or papilledema. Vision grossly normal. Ears:  External ear exam shows no significant lesions or deformities.  Otoscopic examination reveals clear canals, tympanic membranes are intact bilaterally without bulging, retraction, inflammation or discharge. Hearing is grossly normal bilaterally. Mouth:  Oral mucosa and oropharynx without lesions or exudates.  Teeth in good repair. Neck:  No deformities, masses, or tenderness noted. Chest Wall:  No deformities, masses, or tenderness noted. Lungs:  rales involve the right lower lung fields Heart:  Normal rate and regular rhythm. S1 and S2 normal without gallop, murmur, click, rub or other extra sounds. Abdomen:  Bowel sounds positive,abdomen soft and non-tender without masses, organomegaly or hernias noted.   Impression & Recommendations:  Problem # 1:  CHRONIC OBSTRUCTIVE PULMONARY DISEASE, ACUTE EXACERBATION (ICD-491.21)  Problem # 2:  HYPOTHYROIDISM (ICD-244.9)  Her updated medication list for this problem includes:    Synthroid 112 Mcg Tabs (Levothyroxine sodium) ..... Qd  Problem # 3:  GERD (ICD-530.81)  Her updated medication list for this problem includes:    Pantoprazole Sodium 40 Mg Tbec (Pantoprazole sodium) ..... One daily  Complete Medication List: 1)  Advair Diskus 500-50 Mcg/dose Misc (Fluticasone-salmeterol) .... Inhale 1 puff as directed twice a day 2)  Aspirin 81 Mg  Chew (Aspirin) .... Take 1 tablet by mouth once a day 3)   Lipitor 10 Mg Tabs (Atorvastatin calcium) .... Take 1 tablet by mouth every night 4)  Synthroid 112 Mcg Tabs (Levothyroxine sodium) .... Qd 5)  Pantoprazole Sodium 40 Mg Tbec (Pantoprazole sodium) .... One daily 6)  Proventil Hfa 108 (90 Base) Mcg/act Aers (Albuterol sulfate) .... Two puffs every 6 hours as needed for shortness of breath 7)  Azithromycin 250 Mg Tabs (Azithromycin) .... Two initially, then one daily for 4 additional days 8)  Hydrocodone-homatropine 5-1.5 Mg/74ml Syrp (Hydrocodone-homatropine) .... One half to 1 teaspoon every 6 hours as needed for cough  Patient Instructions: 1)  Please schedule a follow-up appointment in 3 months. 2)  Limit your Sodium (Salt). 3)  Take your antibiotic as prescribed until ALL of it is gone, but stop if you develop a rash or swelling and contact our office as soon as possible. Prescriptions: HYDROCODONE-HOMATROPINE 5-1.5 MG/5ML SYRP (HYDROCODONE-HOMATROPINE) one half to 1 teaspoon every 6 hours as needed for cough  #6 oz x 0   Entered and Authorized by:   Gordy Savers  MD   Signed by:   Gordy Savers  MD on 09/28/2010   Method used:   Print then Give to Patient   RxID:   [id number] AZITHROMYCIN 250 MG TABS (AZITHROMYCIN) two initially, then one daily for 4 additional days  #6 x 0   Entered and Authorized by:   Gordy Savers  MD   Signed by:   Gordy Savers  MD on 09/28/2010   Method used:   Print then Give to Patient   RxID:   [id number]    Orders Added: 1)  Est. Patient Level III [95284]

## 2010-12-29 NOTE — Procedures (Signed)
Summary: colonoscopy  colonoscopy   Imported By: Kassie Mends 02/05/2008 12:45:57  _____________________________________________________________________  External Attachment:    Type:   Image     Comment:   colonoscopy

## 2010-12-29 NOTE — Progress Notes (Signed)
Summary: refills  Phone Note Refill Request Message from:  Fax from Pharmacy  Refills Requested: Medication #1:  SYNTHROID 100 MCG TABS 1 once daily  Medication #2:  LIPITOR 10 MG TABS Take 1 tablet by mouth every night [facility]  fax---[phone]  Initial call taken by: Warnell Forester,  December 31, 2009 8:46 AM    Prescriptions: SYNTHROID 100 MCG TABS (LEVOTHYROXINE SODIUM) 1 once daily  #90 x 3   Entered by:   [name], RN   Authorized by:   Gordy Savers  MD   Signed by:   [name], RN on 12/31/2009   Method used:   Electronically to        [facility]* (mail-order)             ,          Ph: [phone]       Fax: [phone]   RxID:   [id number] LIPITOR 10 MG TABS (ATORVASTATIN CALCIUM) Take 1 tablet by mouth every night  #90 x 3   Entered by:   [name], RN   Authorized by:   Gordy Savers  MD   Signed by:   [name], RN on 12/31/2009   Method used:   Electronically to        [facility]* (mail-order)             ,          Ph: [phone]       Fax: [phone]   RxID:   [id number]

## 2010-12-29 NOTE — Progress Notes (Signed)
Summary: WANTS RX  Phone Note Call from Patient   Caller: Patient-[facility] Call For: DR K Complaint: Cough/Sore throat Summary of Call: PT HAS CONGESTION AND COUGH AT NIGHT. SHE DOES NOT COUGH AS BAD AT NIGHT. WOULD LIKE SOMETHING CALLED TO [facility] ON [address]. Initial call taken by: Warnell Forester,  November 09, 2007 8:49 AM  Follow-up for Phone Call        Guaifen/ hydrocodone elixar 100/5  4 oz one or two tsp every 6 hrs for cough  Additional Follow-up for Phone Call Additional follow up Details #1::        medication called in and patient aware. Additional Follow-up by: Rudy Jew, RN,  November 09, 2007 12:31 PM    New/Updated Medications: * GUIAFEN/HYDROCODONE ELIXIR 100/5 1-2 teaspoonfuls every 6 hours for cough   Prescriptions: GUIAFEN/HYDROCODONE ELIXIR 100/5 1-2 teaspoonfuls every 6 hours for cough  #4 ounces x 0   Entered by:   Rudy Jew, RN   Authorized by:   Gordy Savers  MD   Signed by:   Rudy Jew, RN on 11/09/2007   Method used:   Telephoned to ...         RxID:   [id number]

## 2010-12-29 NOTE — Miscellaneous (Signed)
  Medications Added ADVAIR DISKUS 500-50 MCG/DOSE MISC (FLUTICASONE-SALMETEROL) Inhale 1 puff as directed twice a day ASPIRIN 81 MG CHEW (ASPIRIN) Take 1 tablet by mouth once a day LIPITOR 10 MG TABS (ATORVASTATIN CALCIUM) Take 1 tablet by mouth every night SYNTHROID 100 MCG TABS (LEVOTHYROXINE SODIUM)       Allergies Added: LEVAQUIN (LEVOFLOXACIN) SULFAMETHOXAZOLE (SULFAMETHOXAZOLE) Clinical Lists Changes  Medications: Added new medication of ADVAIR DISKUS 500-50 MCG/DOSE MISC (FLUTICASONE-SALMETEROL) Inhale 1 puff as directed twice a day Added new medication of ASPIRIN 81 MG CHEW (ASPIRIN) Take 1 tablet by mouth once a day Added new medication of LIPITOR 10 MG TABS (ATORVASTATIN CALCIUM) Take 1 tablet by mouth every night Added new medication of SYNTHROID 100 MCG TABS (LEVOTHYROXINE SODIUM) Allergies: Added new allergy or adverse reaction of LEVAQUIN (LEVOFLOXACIN) Added new allergy or adverse reaction of SULFAMETHOXAZOLE (SULFAMETHOXAZOLE) Observations: Added new observation of LLIMPORTALLS: completed (07/27/2007 11:44) Added new observation of NKA: F (07/27/2007 11:44) Added new observation of LLIMPORTMEDS: completed (07/27/2007 11:44)

## 2010-12-29 NOTE — Assessment & Plan Note (Signed)
Summary: 6 month fup, flu shot//ccm   Vital Signs:  Patient profile:   77 year old female Weight:      158 pounds Temp:     97.7 degrees F oral BP sitting:   122 / 76  (right arm) Cuff size:   regular  Vitals Entered By: Duard Brady LPN (August 19, 2010 8:24 AM) CC: 6 mos rov - doing well Is Patient Diabetic? No  Flu Vaccine Consent Questions     Do you have a history of severe allergic reactions to this vaccine? no    Any prior history of allergic reactions to egg and/or gelatin? no    Do you have a sensitivity to the preservative Thimersol? no    Do you have a past history of Guillan-Barre Syndrome? no    Do you currently have an acute febrile illness? no    Have you ever had a severe reaction to latex? no    Vaccine information given and explained to patient? yes    Are you currently pregnant? no    Lot Number:[id number]   Exp Date:05/29/2011   Site Given  Left Deltoid IM  CC:  6 mos rov - doing well.  History of Present Illness: 77 year old patient who is seen today for follow-up.  She has a history of COPD, as well as coronary artery disease.  her cardio pulmonary  status has been stable.  She has treated hypothyroidism, and dyslipidemia.  Remains on Lipitor 10 mg daily, which she continues to tolerate well.  Allergies: 1)  Levaquin (Levofloxacin) 2)  Sulfamethoxazole (Sulfamethoxazole)  Past History:  Past Medical History: Reviewed history from 02/13/2010 and no changes required. COPD Hyperlipidemia Hypothyroidism Urosepsis 2004 macular degeneration Coronary artery disease  Past Surgical History: Reviewed history from 08/13/2009 and no changes required. D&C Vein stripping G4 P4 A0 sigmoidoscopy 2001 s/p cataract OD  colonoscopy 2009  Review of Systems  The patient denies anorexia, fever, weight loss, weight gain, vision loss, decreased hearing, hoarseness, chest pain, syncope, dyspnea on exertion, peripheral edema, prolonged cough, headaches,  hemoptysis, abdominal pain, melena, hematochezia, severe indigestion/heartburn, hematuria, incontinence, genital sores, muscle weakness, suspicious skin lesions, transient blindness, difficulty walking, depression, unusual weight change, abnormal bleeding, enlarged lymph nodes, angioedema, and breast masses.    Physical Exam  General:  Well-developed,well-nourished,in no acute distress; alert,appropriate and cooperative throughout examination Eyes:  No corneal or conjunctival inflammation noted. EOMI. Perrla. Funduscopic exam benign, without hemorrhages, exudates or papilledema. Vision grossly normal. Mouth:  Oral mucosa and oropharynx without lesions or exudates.  Teeth in good repair. Neck:  No deformities, masses, or tenderness noted. Lungs:  Normal respiratory effort, chest expands symmetrically. Lungs are clear to auscultation, no crackles or wheezes. Heart:  Normal rate and regular rhythm. S1 and S2 normal without gallop, murmur, click, rub or other extra sounds. Abdomen:  Bowel sounds positive,abdomen soft and non-tender without masses, organomegaly or hernias noted. Msk:  No deformity or scoliosis noted of thoracic or lumbar spine.   Pulses:  R and L carotid,radial,femoral,dorsalis pedis and posterior tibial pulses are full and equal bilaterally Extremities:  No clubbing, cyanosis, edema, or deformity noted with normal full range of motion of all joints.     Impression & Recommendations:  Problem # 1:  CORONARY ARTERY DISEASE (ICD-414.00) Assessment Unchanged  Her updated medication list for this problem includes:    Aspirin 81 Mg Chew (Aspirin) .Marland Kitchen... Take 1 tablet by mouth once a day  Problem # 2:  CHRONIC OBSTRUCTIVE PULMONARY DISEASE, ACUTE  EXACERBATION (ICD-491.21)  Problem # 3:  HYPOTHYROIDISM (ICD-244.9)  Her updated medication list for this problem includes:    Synthroid 112 Mcg Tabs (Levothyroxine sodium) ..... Qd  Complete Medication List: 1)  Advair Diskus 500-50  Mcg/dose Misc (Fluticasone-salmeterol) .... Inhale 1 puff as directed twice a day 2)  Aspirin 81 Mg Chew (Aspirin) .... Take 1 tablet by mouth once a day 3)  Lipitor 10 Mg Tabs (Atorvastatin calcium) .... Take 1 tablet by mouth every night 4)  Synthroid 112 Mcg Tabs (Levothyroxine sodium) .... Qd 5)  Pantoprazole Sodium 40 Mg Tbec (Pantoprazole sodium) .... One daily 6)  Proventil Hfa 108 (90 Base) Mcg/act Aers (Albuterol sulfate) .... Two puffs every 6 hours as needed for shortness of breath  Other Orders: Flu Vaccine 24yrs + MEDICARE PATIENTS (Z6109) Administration Flu vaccine - MCR (U0454)  Patient Instructions: 1)  Please schedule a follow-up appointment in 6 months for annual exam 2)  Limit your Sodium (Salt). 3)  It is important that you exercise regularly at least 20 minutes 5 times a week. If you develop chest pain, have severe difficulty breathing, or feel very tired , stop exercising immediately and seek medical attention. Prescriptions: PROVENTIL HFA 108 (90 BASE) MCG/ACT AERS (ALBUTEROL SULFATE) two puffs every 6 hours as needed for shortness of breath  #3 x 6   Entered and Authorized by:   Gordy Savers  MD   Signed by:   Gordy Savers  MD on 08/19/2010   Method used:   Electronically to        [facility]* (retail)             ,          Ph: [phone]       Fax: [phone]   RxID:   [id number] PANTOPRAZOLE SODIUM 40 MG  TBEC (PANTOPRAZOLE SODIUM) one daily  #90 x 3   Entered and Authorized by:   Gordy Savers  MD   Signed by:   Gordy Savers  MD on 08/19/2010   Method used:   Electronically to        [facility]* (retail)             ,          Ph: [phone]       Fax: [phone]   RxID:   [id number] SYNTHROID 112 MCG TABS (LEVOTHYROXINE SODIUM) qd  #90 x 3   Entered and Authorized by:   Gordy Savers  MD   Signed by:   Gordy Savers  MD on 08/19/2010   Method used:   Electronically to        [facility]* (retail)             ,          Ph: [phone]       Fax: [phone]   RxID:   [id number] LIPITOR 10 MG TABS (ATORVASTATIN CALCIUM) Take 1 tablet by mouth every night  #90 x 6   Entered and Authorized by:   Gordy Savers  MD   Signed by:   Gordy Savers  MD on 08/19/2010   Method used:   Electronically to        [facility]* (retail)             ,          Ph: [phone]       Fax: [phone]  RxID:   [id number] ADVAIR DISKUS 500-50 MCG/DOSE MISC (FLUTICASONE-SALMETEROL) Inhale 1 puff as directed twice a day  #3 x 6   Entered and Authorized by:   Gordy Savers  MD   Signed by:   Gordy Savers  MD on 08/19/2010   Method used:   Electronically to        [facility]* (retail)             ,          Ph: [phone]       Fax: [phone]   RxID:   [id number]

## 2010-12-29 NOTE — Assessment & Plan Note (Signed)
Summary: COPD worse/dm   Vital Signs:  Patient Profile:   77 Years Old Female Weight:      156 pounds O2 Sat:      94 % O2 treatment:    Room Air Temp:     99.4 degrees F oral BP sitting:   152 / 88  (left arm) Cuff size:   regular  Vitals Entered By: [name], RN (October 22, 2008 2:02 PM)                 Chief Complaint:  Feels sick with chest cong and cough..  History of Present Illness: 77  old patient history of COPD presents with a 6-day history of worsening chest congestion and cough.  She is treated dyslipidemia, hypothyroidism.  Denies any fever, wheezing, or productive cough.  Other complaints include malaise.  Review of her record reveals an allergy to Levaquin.  Until her acute illness her pulmonary  status has been stable.  She is on maintenance Advair as well as rescue albuterol    Current Allergies: LEVAQUIN (LEVOFLOXACIN) SULFAMETHOXAZOLE (SULFAMETHOXAZOLE)  Past Medical History:    Reviewed history from 09/18/2007 and no changes required:       COPD       Hyperlipidemia       Hypothyroidism       Urosepsis 2004       macular degeneration  Past Surgical History:    Reviewed history from 12/25/2007 and no changes required:       D&C       Vein stripping       G4 P4 A0       sigmoidoscopy 2001   Family History:    Reviewed history from 09/18/2007 and no changes required:       father died age 76 in my       mother died 65 gynecologic cancer       4 brothers two sisters       positive for colon cancer, coronary artery  disease, scleroderma    Review of Systems       The patient complains of prolonged cough and muscle weakness.  The patient denies anorexia, fever, weight loss, weight gain, vision loss, decreased hearing, hoarseness, chest pain, syncope, dyspnea on exertion, peripheral edema, headaches, hemoptysis, abdominal pain, melena, hematochezia, severe indigestion/heartburn, hematuria, incontinence, genital sores, suspicious skin  lesions, transient blindness, difficulty walking, depression, unusual weight change, abnormal bleeding, enlarged lymph nodes, angioedema, and breast masses.     Physical Exam  General:     Well-developed,well-nourished,in no acute distress; alert,appropriate and cooperative throughout examination Head:     Normocephalic and atraumatic without obvious abnormalities. No apparent alopecia or balding. Eyes:     No corneal or conjunctival inflammation noted. EOMI. Perrla. Funduscopic exam benign, without hemorrhages, exudates or papilledema. Vision grossly normal. Ears:     External ear exam shows no significant lesions or deformities.  Otoscopic examination reveals clear canals, tympanic membranes are intact bilaterally without bulging, retraction, inflammation or discharge. Hearing is grossly normal bilaterally. Mouth:     Oral mucosa and oropharynx without lesions or exudates.  Teeth in good repair. Neck:     No deformities, masses, or tenderness noted. Lungs:     Normal respiratory effort, chest expands symmetrically. Lungs are clear to auscultation, no crackles or wheezes. O2 saturation 95% Heart:     Normal rate and regular rhythm. S1 and S2 normal without gallop, murmur, click, rub or other extra sounds.  Abdomen:     Bowel sounds positive,abdomen soft and non-tender without masses, organomegaly or hernias noted. Msk:     No deformity or scoliosis noted of thoracic or lumbar spine.   Pulses:     R and L carotid,radial,femoral,dorsalis pedis and posterior tibial pulses are full and equal bilaterally Extremities:     No clubbing, cyanosis, edema, or deformity noted with normal full range of motion of all joints.      Impression & Recommendations:  Problem # 1:  BRONCHITIS-ACUTE (ICD-466.0)  Her updated medication list for this problem includes:    Advair Diskus 500-50 Mcg/dose Misc (Fluticasone-salmeterol) ..... Inhale 1 puff as directed twice a day    Cephalexin 500 Mg Caps  (Cephalexin) ..... One tablet 3 times daily    Proventil Hfa 108 (90 Base) Mcg/act Aers (Albuterol sulfate) .Marland Kitchen..Marland Kitchen Two puffs every 6 hours as needed for shortness of breath    Doxycycline Monohydrate 100 Mg Tabs (Doxycycline monohydrate) ..... One tablet twice daily   Problem # 2:  HYPOTHYROIDISM (ICD-244.9)  Her updated medication list for this problem includes:    Synthroid 100 Mcg Tabs (Levothyroxine sodium) .Marland Kitchen... 1 once daily   Problem # 3:  CHRONIC OBSTRUCTIVE PULMONARY DISEASE, ACUTE EXACERBATION (ICD-491.21)  Complete Medication List: 1)  Advair Diskus 500-50 Mcg/dose Misc (Fluticasone-salmeterol) .... Inhale 1 puff as directed twice a day 2)  Aspirin 81 Mg Chew (Aspirin) .... Take 1 tablet by mouth once a day 3)  Lipitor 10 Mg Tabs (Atorvastatin calcium) .... Take 1 tablet by mouth every night 4)  Synthroid 100 Mcg Tabs (Levothyroxine sodium) .Marland Kitchen.. 1 once daily 5)  Guiafen/hydrocodone Elixir 100/5  .Marland Kitchen.. 1-2 teaspoonfuls every 6 hours for cough 6)  Pantoprazole Sodium 40 Mg Tbec (Pantoprazole sodium) .... One daily 7)  Cephalexin 500 Mg Caps (Cephalexin) .... One tablet 3 times daily 8)  Proventil Hfa 108 (90 Base) Mcg/act Aers (Albuterol sulfate) .... Two puffs every 6 hours as needed for shortness of breath 9)  Doxycycline Monohydrate 100 Mg Tabs (Doxycycline monohydrate) .... One tablet twice daily   Patient Instructions: 1)  Get plenty of rest, drink lots of clear liquids, and use Tylenol or Ibuprofen for fever and comfort. Return in 7-10 days if you're not better:sooner if you're feeling worse. 2)  Take your antibiotic as prescribed until ALL of it is gone, but stop if you develop a rash or swelling and contact our office as soon as possible.   Prescriptions: DOXYCYCLINE MONOHYDRATE 100 MG TABS (DOXYCYCLINE MONOHYDRATE) one tablet twice daily  #14 x 0   Entered and Authorized by:   Gordy Savers  MD   Signed by:   Gordy Savers  MD on 10/22/2008   Method used:    Print then Give to Patient   RxID:   [id number]  ]

## 2010-12-29 NOTE — Progress Notes (Signed)
Summary: No Call No Show 6 mos rov  Phone Note Outgoing Call   Call placed by: Duard Brady LPN,  August 14, 2010 9:21 AM Call placed to: Patient Summary of Call: NO CALL NO SHOW 6 mos rov - attempt to call - ans mach - LMTCB and r/s 6 mos rov . KIK Initial call taken by: Duard Brady LPN,  August 14, 2010 9:22 AM

## 2010-12-29 NOTE — Assessment & Plan Note (Signed)
Summary: [id number]   Vital Signs:  Patient Profile:   77 Years Old Female Weight:      150 pounds Temp:     97.9 degrees F oral BP sitting:   124 / 70  (left arm)  Vitals Entered By: [name], RN (December 25, 2007 9:01 AM)                 Chief Complaint:  OV and fasting. C/o nausea & dizziness last few days. C/o LBP and L heel pain. C/o reflux.Marland Kitchen  History of Present Illness: 77 year old female seen today for an annual exam.  For the past couple days.  She has had some mild vertigo that seems to be improving.  Other complaints include right heel pain and also some significant nocturnal reflux symptoms. She denies any cardiopulmonary complaints She did have a screening sigmoidoscopy in 2001, but no history of a prior colonoscopy.  A brother has had colon cancer  Current Allergies: LEVAQUIN (LEVOFLOXACIN) SULFAMETHOXAZOLE (SULFAMETHOXAZOLE)  Past Surgical History:    D&C    Vein stripping    G4 P4 A0    sigmoidoscopy 2001   Social History:    Married    Review of Systems  The patient denies anorexia, fever, weight loss, vision loss, decreased hearing, hoarseness, chest pain, syncope, dyspnea on exhertion, peripheral edema, prolonged cough, hemoptysis, abdominal pain, melena, hematochezia, severe indigestion/heartburn, hematuria, incontinence, genital sores, muscle weakness, suspicious skin lesions, transient blindness, difficulty walking, depression, unusual weight change, abnormal bleeding, enlarged lymph nodes, angioedema, breast masses, and testicular masses.         complain of reflux symptoms, right heel pain, mild positional vertigo   Physical Exam  General:     Well-developed,well-nourished,in no acute distress; alert,appropriate and cooperative throughout examination Head:     Normocephalic and atraumatic without obvious abnormalities. No apparent alopecia or balding. Eyes:     No corneal or conjunctival inflammation noted. EOMI. Perrla. Funduscopic  exam benign, without hemorrhages, exudates or papilledema. Vision grossly normal. Ears:     External ear exam shows no significant lesions or deformities.  Otoscopic examination reveals clear canals, tympanic membranes are intact bilaterally without bulging, retraction, inflammation or discharge. Hearing is grossly normal bilaterally. Mouth:     Oral mucosa and oropharynx without lesions or exudates.  Teeth in good repair. Neck:     No deformities, masses, or tenderness noted. Chest Wall:     No deformities, masses, or tenderness noted. Breasts:     No mass, nodules, thickening, tenderness, bulging, retraction, inflamation, nipple discharge or skin changes noted.   Lungs:     Normal respiratory effort, chest expands symmetrically. Lungs are clear to auscultation, no crackles or wheezes. Heart:     Normal rate and regular rhythm. S1 and S2 normal without gallop, murmur, click, rub or other extra sounds. Abdomen:     Bowel sounds positive,abdomen soft and non-tender without masses, organomegaly or hernias noted. Rectal:     No external abnormalities noted. Normal sphincter tone. No rectal masses or tenderness. Genitalia:     Normal introitus for age, no external lesions, no vaginal discharge, mucosa pink and moist, no vaginal or cervical lesions, no vaginal atrophy, no friaility or hemorrhage, normal uterus size and position, no adnexal masses or tenderness Msk:     No deformity or scoliosis noted of thoracic or lumbar spine.   Pulses:     dorsalis pedis pulses were full.  Posterior tibia pulses were not easily palpable Extremities:  No clubbing, cyanosis, edema, or deformity noted with normal full range of motion of all joints.   Neurologic:     No cranial nerve deficits noted. Station and gait are normal. Plantar reflexes are down-going bilaterally. DTRs are symmetrical throughout. Sensory, motor and coordinative functions appear intact. Skin:     Intact without suspicious lesions or  rashes Cervical Nodes:     No lymphadenopathy noted Axillary Nodes:     No palpable lymphadenopathy Inguinal Nodes:     No significant adenopathy Psych:     Cognition and judgment appear intact. Alert and cooperative with normal attention span and concentration. No apparent delusions, illusions, hallucinations    Impression & Recommendations:  Problem # 1:  CHRONIC OBSTRUCTIVE PULMONARY DISEASE, ACUTE EXACERBATION (ICD-491.21)  Orders: Pelvic & Breast Exam ( Medicare)  (D6644)   Problem # 2:  HYPOTHYROIDISM (ICD-244.9)  Her updated medication list for this problem includes:    Synthroid 100 Mcg Tabs (Levothyroxine sodium) .Marland Kitchen... 1 once daily   Problem # 3:  HYPERLIPIDEMIA (ICD-272.4)  Her updated medication list for this problem includes:    Lipitor 10 Mg Tabs (Atorvastatin calcium) .Marland Kitchen... Take 1 tablet by mouth every night  Orders: EKG w/ Interpretation (93000) Pelvic & Breast Exam ( Medicare)  (I3474)   Problem # 4:  GERD (ICD-530.81)  Her updated medication list for this problem includes:    Pantoprazole Sodium 40 Mg Tbec (Pantoprazole sodium) ..... One daily   Complete Medication List: 1)  Advair Diskus 500-50 Mcg/dose Misc (Fluticasone-salmeterol) .... Inhale 1 puff as directed twice a day 2)  Aspirin 81 Mg Chew (Aspirin) .... Take 1 tablet by mouth once a day 3)  Lipitor 10 Mg Tabs (Atorvastatin calcium) .... Take 1 tablet by mouth every night 4)  Synthroid 100 Mcg Tabs (Levothyroxine sodium) .Marland Kitchen.. 1 once daily 5)  Guiafen/hydrocodone Elixir 100/5  .Marland Kitchen.. 1-2 teaspoonfuls every 6 hours for cough 6)  Pantoprazole Sodium 40 Mg Tbec (Pantoprazole sodium) .... One daily  Other Orders: Gastroenterology Referral (GI)   Patient Instructions: 1)  Please schedule a follow-up appointment in 6 months. 2)  Limit your Sodium (Salt) to less than 2 grams a day(slightly less than 1/2 a teaspoon) to prevent fluid retention, swelling, or worsening of symptoms. 3)  It is  important that you exercise regularly at least 20 minutes 5 times a week. If you develop chest pain, have severe difficulty breathing, or feel very tired , stop exercising immediately and seek medical attention. 4)  Schedule your mammogram. 5)  Schedule a colonoscopy/sigmoidoscopy to help detect colon cancer. 6)  Take calcium +Vitamin D daily.    Prescriptions: PANTOPRAZOLE SODIUM 40 MG  TBEC (PANTOPRAZOLE SODIUM) one daily  #90 x 6   Entered and Authorized by:   Gordy Savers  MD   Signed by:   Gordy Savers  MD on 12/25/2007   Method used:   Print then Give to Patient   RxID:   [id number] SYNTHROID 100 MCG TABS (LEVOTHYROXINE SODIUM) 1 once daily  #90 x 6   Entered and Authorized by:   Gordy Savers  MD   Signed by:   Gordy Savers  MD on 12/25/2007   Method used:   Print then Give to Patient   RxID:   [id number] LIPITOR 10 MG TABS (ATORVASTATIN CALCIUM) Take 1 tablet by mouth every night  #90 x 6   Entered and Authorized by:   Gordy Savers  MD   Signed by:  Gordy Savers  MD on 12/25/2007   Method used:   Print then Give to Patient   RxID:   [id number] ADVAIR DISKUS 500-50 MCG/DOSE MISC (FLUTICASONE-SALMETEROL) Inhale 1 puff as directed twice a day  #3 x 6   Entered and Authorized by:   Gordy Savers  MD   Signed by:   Gordy Savers  MD on 12/25/2007   Method used:   Print then Give to Patient   RxID:   [id number]  ]

## 2010-12-29 NOTE — Assessment & Plan Note (Signed)
Summary: 6 mos follow-up/mae   Vital Signs:  Patient Profile:   77 Years Old Female Weight:      156 pounds Temp:     98.1 degrees F oral BP sitting:   158 / 78  (left arm) Cuff size:   regular  Vitals Entered By: [name], RN (June 18, 2008 10:10 AM)                 Chief Complaint:  ROV. C/o chills & fever (100) at night and LLE injured in fall July 11th. Has staples in leg. Still red and sore.Holly [name]  History of Present Illness: 77 year old patient his sustained trauma in a laceration to her left lower extremity approximately 10 days ago.  She is scheduled for staple removal later today.  She has developed some soft tissue swelling, redness, and discomfort, medial to the laceration.  She has had some mild fever and chills    Current Allergies: LEVAQUIN (LEVOFLOXACIN) SULFAMETHOXAZOLE (SULFAMETHOXAZOLE)  Past Medical History:    Reviewed history from 09/18/2007 and no changes required:       COPD       Hyperlipidemia       Hypothyroidism       Urosepsis 2004       macular degeneration     Review of Systems       The patient complains of fever.     Physical Exam  General:     Well-developed,well-nourished,in no acute distress; alert,appropriate and cooperative throughout examination Mouth:     Oral mucosa and oropharynx without lesions or exudates.  Teeth in good repair. Neck:     No deformities, masses, or tenderness noted. Lungs:     Normal respiratory effort, chest expands symmetrically. Lungs are clear to auscultation, no crackles or wheezes. Heart:     Normal rate and regular rhythm. S1 and S2 normal without gallop, murmur, click, rub or other extra sounds.   no tachycardia Skin:     a laceration was noted involving the left lower leg with staples in place just medial to the wound was a 4-cm area of patchy erythema, tenderness, and excessive warmth    Impression & Recommendations:  Problem # 1:  CELLULITIS (ICD-682.9)  Her updated medication  list for this problem includes:    Cephalexin 500 Mg Caps (Cephalexin) ..... One tablet 3 times daily   Complete Medication List: 1)  Advair Diskus 500-50 Mcg/dose Misc (Fluticasone-salmeterol) .... Inhale 1 puff as directed twice a day 2)  Aspirin 81 Mg Chew (Aspirin) .... Take 1 tablet by mouth once a day 3)  Lipitor 10 Mg Tabs (Atorvastatin calcium) .... Take 1 tablet by mouth every night 4)  Synthroid 100 Mcg Tabs (Levothyroxine sodium) .Holly [name].. 1 once daily 5)  Guiafen/hydrocodone Elixir 100/5  .Holly [name].. 1-2 teaspoonfuls every 6 hours for cough 6)  Pantoprazole Sodium 40 Mg Tbec (Pantoprazole sodium) .... One daily 7)  Cephalexin 500 Mg Caps (Cephalexin) .... One tablet 3 times daily   Patient Instructions: 1)  Please schedule a follow-up appointment in 4 months. 2)  Limit your Sodium (Salt). 3)  Take your antibiotic as prescribed until ALL of it is gone, but stop if you develop a rash or swelling and contact our office as soon as possible.   Prescriptions: CEPHALEXIN 500 MG  CAPS (CEPHALEXIN) one tablet 3 times daily  #30 x 0   Entered and Authorized by:   Gordy Savers  MD   Signed by:   Janett Labella  Amador Cunas  MD on 06/18/2008   Method used:   Print then Give to Patient   RxID:   [id number]  ]

## 2010-12-29 NOTE — Progress Notes (Signed)
Summary: WHEN WAS LAST TETNUS SHOT  Phone Note Call from Patient Call back at [phone]   Caller: PT DAUGHTER Holly [name] Call For: k  Summary of Call: CUT HER LEG NEEDS TO KNOW WHEN HER LAST TETNUS SHOT WAS.   Initial call taken by: Roselle Locus,  June 10, 2008 8:25 AM  Follow-up for Phone Call        Oct 2001 last OV, daughter informed.  They are going back to  Urgent Care to have Td updated Follow-up by: Sid Falcon LPN,  June 10, 2008 10:37 AM

## 2010-12-29 NOTE — Progress Notes (Signed)
  Phone Note Call from Patient Call back at Home Phone [phone]   Caller: Patient Call For: Dr. Tawanna Cooler [facility] Summary of Call: Pt. paged Dr. Tawanna Cooler and requested a refill on her cough syrup VI-Q-Tuss Syrup QUA because she spilled it.  Per Dr. Tawanna Cooler call in  4 ounces and 1 refill, pt. is doing well with her coughing using the medication. Will call in to [facility]. ...................................................................Ardyth Man  November 11, 2007 10:30 AM  Initial call taken by: Ardyth Man,  November 11, 2007 10:30 AM      Prescriptions: GUIAFEN/HYDROCODONE ELIXIR 100/5 1-2 teaspoonfuls every 6 hours for cough  #4 ounces x 1   Entered by:   Ardyth Man   Authorized by:   Roderick Pee MD   Signed by:   Ardyth Man on 11/11/2007   Method used:   Telephoned to ...       [facility]  [address] [id number]       [address]       [place], [place]  [address]       Ph: [phone] or [phone]       Fax: [phone]   RxID:   [id number]

## 2010-12-29 NOTE — Progress Notes (Signed)
Summary: Pt req med called in to [facility] for flu like symptoms  Phone Note Call from Patient Call back at [facility] [phone]     Caller: daughter  -  [facility] Summary of Call: Pts daughter called and said that she and pt are in [place], [place] for family emergency. Pt is sick. Pt has fever, chills, loss of appetite and flu like symptoms. Pt is coming back home later today and is req that Dr. Amador Cunas [facility] [address].  If there are any questions, please call pts husband, Shakyia Bosso [phone] Initial call taken by: Lucy Antigua,  September 25, 2010 10:26 AM  Follow-up for Phone Call        suggest tylenol for fever control and mucinex DM;  ROV if unimproved Follow-up by: Gordy Savers  MD,  September 25, 2010 12:51 PM  Additional Follow-up for Phone Call Additional follow up Details #1::        Phone Call Completed Additional Follow-up by: Alfred Levins, CMA,  September 25, 2010 2:31 PM

## 2010-12-29 NOTE — Progress Notes (Signed)
Summary: refill  Phone Note Refill Request Message from:  Fax from Pharmacy  Refills Requested: Medication #1:  PANTOPRAZOLE SODIUM 40 MG  TBEC one daily   Dosage confirmed as above?Dosage Confirmed [facility] mail order pharmacy Fax---[phone]  Initial call taken by: Warnell Forester,  December 08, 2009 10:23 AM    Prescriptions: PANTOPRAZOLE SODIUM 40 MG  TBEC (PANTOPRAZOLE SODIUM) one daily  #90 x 3   Entered by:   [name], RN   Authorized by:   Gordy Savers  MD   Signed by:   [name], RN on 12/08/2009   Method used:   Electronically to        [facility] (mail-order)             ,          Ph: [phone]       Fax: [phone]   RxID:   [id number]

## 2010-12-29 NOTE — Assessment & Plan Note (Signed)
Summary: tetnus shot/judy/mhf   Nurse Visit    Prior Medications: ADVAIR DISKUS 500-50 MCG/DOSE MISC (FLUTICASONE-SALMETEROL) Inhale 1 puff as directed twice a day ASPIRIN 81 MG CHEW (ASPIRIN) Take 1 tablet by mouth once a day LIPITOR 10 MG TABS (ATORVASTATIN CALCIUM) Take 1 tablet by mouth every night SYNTHROID 100 MCG TABS (LEVOTHYROXINE SODIUM) 1 once daily GUIAFEN/HYDROCODONE ELIXIR 100/5 () 1-2 teaspoonfuls every 6 hours for cough PANTOPRAZOLE SODIUM 40 MG  TBEC (PANTOPRAZOLE SODIUM) one daily Current Allergies: LEVAQUIN (LEVOFLOXACIN) SULFAMETHOXAZOLE (SULFAMETHOXAZOLE)   Tetanus/Td Vaccine    Vaccine Type: Td    Site: left deltoid    Mfr: Sanofi Pasteur    Dose: 0.5 ml    Route: IM    Given by: [name], RN    Exp. Date: 12/2009    Lot #: [id number]    VIS given: 10/17/07 version given June 11, 2008.   Orders Added: 1)  Tetanus Toxoid w/Dx [45409] 2)  Admin 1st Vaccine Mishka.Peer    ]

## 2010-12-29 NOTE — Assessment & Plan Note (Signed)
Summary: flu shot/jls   Nurse Visit Flu Vaccine Consent Questions     Do you have a history of severe allergic reactions to this vaccine? no    Any prior history of allergic reactions to egg and/or gelatin? no    Do you have a sensitivity to the preservative Thimersol? no    Do you have a past history of Guillan-Barre Syndrome? no    Do you currently have an acute febrile illness? no    Have you ever had a severe reaction to latex? no    Vaccine information given and explained to patient? yes    Are you currently pregnant? no    Lot Number:[id number]   Site Given  Left Deltoid IM Romualdo Bolk, CMA  September 10, 2008 10:31 AM    Prior Medications: ADVAIR DISKUS 500-50 MCG/DOSE MISC (FLUTICASONE-SALMETEROL) Inhale 1 puff as directed twice a day ASPIRIN 81 MG CHEW (ASPIRIN) Take 1 tablet by mouth once a day LIPITOR 10 MG TABS (ATORVASTATIN CALCIUM) Take 1 tablet by mouth every night SYNTHROID 100 MCG TABS (LEVOTHYROXINE SODIUM) 1 once daily GUIAFEN/HYDROCODONE ELIXIR 100/5 () 1-2 teaspoonfuls every 6 hours for cough PANTOPRAZOLE SODIUM 40 MG  TBEC (PANTOPRAZOLE SODIUM) one daily CEPHALEXIN 500 MG  CAPS (CEPHALEXIN) one tablet 3 times daily Current Allergies: LEVAQUIN (LEVOFLOXACIN) SULFAMETHOXAZOLE (SULFAMETHOXAZOLE)    Orders Added: 1)  Admin 1st Vaccine [90471] 2)  Flu Vaccine 38yrs + Baden.Dew    ]

## 2010-12-29 NOTE — Assessment & Plan Note (Signed)
Summary: flu shot/njr  Flu Vaccine Consent Questions     Do you have a history of severe allergic reactions to this vaccine? no    Any prior history of allergic reactions to egg and/or gelatin? no    Do you have a sensitivity to the preservative Thimersol? no    Do you have a past history of Guillan-Barre Syndrome? no    Do you currently have an acute febrile illness? no    Have you ever had a severe reaction to latex? no    Vaccine information given and explained to patient? yes    Are you currently pregnant? no    Lot Number:[id number]   Exp Date:05/28/2010   Site Given  Left Deltoid IM  Nurse Visit   Allergies: 1)  Levaquin (Levofloxacin) 2)  Sulfamethoxazole (Sulfamethoxazole)  Orders Added: 1)  Flu Vaccine 38yrs + [16109] 2)  Administration Flu vaccine - MCR [G0008]

## 2010-12-29 NOTE — Assessment & Plan Note (Signed)
Summary: 4 month rov/njr   Vital Signs:  Patient Profile:   77 Years Old Female Weight:      159 pounds Temp:     98.2 degrees F oral BP sitting:   116 / 66  (left arm) Cuff size:   regular  Vitals Entered By: [name], RN (October 14, 2008 10:09 AM)                 Chief Complaint:  4 mo ROV.Marland Kitchen  History of Present Illness:  77 year old female, history of COPD, gastroesophageal reflux disease, and dyslipidemia.  Her primary status is stable.  She does have some mild dyspnea on exertion, which is unchanged.  Additionally, she has hypothyroidism.  Her reflux symptoms have been stable    Current Allergies: LEVAQUIN (LEVOFLOXACIN) SULFAMETHOXAZOLE (SULFAMETHOXAZOLE)  Past Medical History:    Reviewed history from 09/18/2007 and no changes required:       COPD       Hyperlipidemia       Hypothyroidism       Urosepsis 2004       macular degeneration   Family History:    Reviewed history from 09/18/2007 and no changes required:       father died age 22 in my       mother died 19 gynecologic cancer       4 brothers two sisters       positive for colon cancer, coronary artery  disease, scleroderma    Review of Systems       The patient complains of dyspnea on exertion.  The patient denies anorexia, fever, weight loss, weight gain, vision loss, decreased hearing, hoarseness, chest pain, syncope, peripheral edema, prolonged cough, headaches, hemoptysis, abdominal pain, melena, hematochezia, severe indigestion/heartburn, hematuria, incontinence, genital sores, muscle weakness, suspicious skin lesions, transient blindness, difficulty walking, depression, unusual weight change, abnormal bleeding, enlarged lymph nodes, angioedema, and breast masses.     Physical Exam  General:     overweight-appearing.  normal blood pressure Head:     Normocephalic and atraumatic without obvious abnormalities. No apparent alopecia or balding. Eyes:     No corneal or conjunctival  inflammation noted. EOMI. Perrla. Funduscopic exam benign, without hemorrhages, exudates or papilledema. Vision grossly normal. Mouth:     Oral mucosa and oropharynx without lesions or exudates.  Teeth in good repair. Neck:     No deformities, masses, or tenderness noted. Lungs:     Normal respiratory effort, chest expands symmetrically. Lungs are clear to auscultation, no crackles or wheezes. Heart:     Normal rate and regular rhythm. S1 and S2 normal without gallop, murmur, click, rub or other extra sounds. Abdomen:     Bowel sounds positive,abdomen soft and non-tender without masses, organomegaly or hernias noted. Msk:     No deformity or scoliosis noted of thoracic or lumbar spine.   Pulses:     R and L carotid,radial,femoral,dorsalis pedis and posterior tibial pulses are full and equal bilaterally Extremities:     No clubbing, cyanosis, edema, or deformity noted with normal full range of motion of all joints.      Impression & Recommendations:  Problem # 1:  GERD (ICD-530.81)  Her updated medication list for this problem includes:    Pantoprazole Sodium 40 Mg Tbec (Pantoprazole sodium) ..... One daily   Problem # 2:  CHRONIC OBSTRUCTIVE PULMONARY DISEASE, ACUTE EXACERBATION (ICD-491.21)  Problem # 3:  HYPOTHYROIDISM (ICD-244.9)  Her updated medication list for this problem includes:  Synthroid 100 Mcg Tabs (Levothyroxine sodium) .Marland Kitchen... 1 once daily   Problem # 4:  HYPERLIPIDEMIA (ICD-272.4)  Her updated medication list for this problem includes:    Lipitor 10 Mg Tabs (Atorvastatin calcium) .Marland Kitchen... Take 1 tablet by mouth every night   Complete Medication List: 1)  Advair Diskus 500-50 Mcg/dose Misc (Fluticasone-salmeterol) .... Inhale 1 puff as directed twice a day 2)  Aspirin 81 Mg Chew (Aspirin) .... Take 1 tablet by mouth once a day 3)  Lipitor 10 Mg Tabs (Atorvastatin calcium) .... Take 1 tablet by mouth every night 4)  Synthroid 100 Mcg Tabs (Levothyroxine  sodium) .Marland Kitchen.. 1 once daily 5)  Guiafen/hydrocodone Elixir 100/5  .Marland Kitchen.. 1-2 teaspoonfuls every 6 hours for cough 6)  Pantoprazole Sodium 40 Mg Tbec (Pantoprazole sodium) .... One daily 7)  Cephalexin 500 Mg Caps (Cephalexin) .... One tablet 3 times daily 8)  Proventil Hfa 108 (90 Base) Mcg/act Aers (Albuterol sulfate) .... Two puffs every 6 hours as needed for shortness of breath   Patient Instructions: 1)  Please schedule a follow-up appointment in 4 months. 2)  Advised not to eat any food or drink any liquids after 10 PM the night before your procedure. 3)  Limit your Sodium (Salt) to less than 2 grams a day(slightly less than 1/2 a teaspoon) to prevent fluid retention, swelling, or worsening of symptoms. 4)  It is important that you exercise regularly at least 20 minutes 5 times a week. If you develop chest pain, have severe difficulty breathing, or feel very tired , stop exercising immediately and seek medical attention. 5)  You need to lose weight. Consider a lower calorie diet and regular exercise.    Prescriptions: PROVENTIL HFA 108 (90 BASE) MCG/ACT AERS (ALBUTEROL SULFATE) two puffs every 6 hours as needed for shortness of breath  #3 x 5   Entered and Authorized by:   Gordy Savers  MD   Signed by:   Gordy Savers  MD on 10/14/2008   Method used:   Print then Give to Patient   RxID:   [id number] PANTOPRAZOLE SODIUM 40 MG  TBEC (PANTOPRAZOLE SODIUM) one daily  #90 x 6   Entered and Authorized by:   Gordy Savers  MD   Signed by:   Gordy Savers  MD on 10/14/2008   Method used:   Print then Give to Patient   RxID:   [id number] SYNTHROID 100 MCG TABS (LEVOTHYROXINE SODIUM) 1 once daily  #90 x 6   Entered and Authorized by:   Gordy Savers  MD   Signed by:   Gordy Savers  MD on 10/14/2008   Method used:   Print then Give to Patient   RxID:   [id number] LIPITOR 10 MG TABS (ATORVASTATIN CALCIUM) Take 1 tablet by mouth every night   #90 x 6   Entered and Authorized by:   Gordy Savers  MD   Signed by:   Gordy Savers  MD on 10/14/2008   Method used:   Print then Give to Patient   RxID:   [id number] ADVAIR DISKUS 500-50 MCG/DOSE MISC (FLUTICASONE-SALMETEROL) Inhale 1 puff as directed twice a day  #3 x 6   Entered and Authorized by:   Gordy Savers  MD   Signed by:   Gordy Savers  MD on 10/14/2008   Method used:   Print then Give to Patient   RxID:   [id number]  ]

## 2010-12-29 NOTE — Assessment & Plan Note (Signed)
Summary: cough/mhf   Vital Signs:  Patient Profile:   77 Years Old Female Weight:      150 pounds Temp:     98.3 degrees F oral BP sitting:   122 / 78  (left arm)  Vitals Entered By: [name], RN (November 16, 2007 11:51 AM)                 Chief Complaint:  C/o productive cough, weak, and congestion for about a week.Marland Kitchen  History of Present Illness: 77 year old, white female, history of COPD with a one-week history of increasing cough, congestion, and general sense of unwellness and  Current Allergies: LEVAQUIN (LEVOFLOXACIN) SULFAMETHOXAZOLE (SULFAMETHOXAZOLE)      Physical Exam  General:     elderly weak, but in no acute distress Head:     Normocephalic and atraumatic without obvious abnormalities. No apparent alopecia or balding. Eyes:     No corneal or conjunctival inflammation noted. EOMI. Perrla. Funduscopic exam benign, without hemorrhages, exudates or papilledema. Vision grossly normal. Ears:     External ear exam shows no significant lesions or deformities.  Otoscopic examination reveals clear canals, tympanic membranes are intact bilaterally without bulging, retraction, inflammation or discharge. Hearing is grossly normal bilaterally. Mouth:     Oral mucosa and oropharynx without lesions or exudates.  Teeth in good repair. Neck:     No deformities, masses, or tenderness noted. Lungs:     breath sounds are diminished a few rhonchi noted over  the right mid lung field Heart:     Normal rate and regular rhythm. S1 and S2 normal without gallop, murmur, click, rub or other extra sounds. Abdomen:     Bowel sounds positive,abdomen soft and non-tender without masses, organomegaly or hernias noted. Extremities:     no edema    Impression & Recommendations:  Problem # 1:  CHRONIC OBSTRUCTIVE PULMONARY DISEASE, ACUTE EXACERBATION (ICD-491.21)  Problem # 2:  HYPERLIPIDEMIA (ICD-272.4)  Her updated medication list for this problem includes:    Lipitor  10 Mg Tabs (Atorvastatin calcium) .Marland Kitchen... Take 1 tablet by mouth every night   Complete Medication List: 1)  Advair Diskus 500-50 Mcg/dose Misc (Fluticasone-salmeterol) .... Inhale 1 puff as directed twice a day 2)  Aspirin 81 Mg Chew (Aspirin) .... Take 1 tablet by mouth once a day 3)  Lipitor 10 Mg Tabs (Atorvastatin calcium) .... Take 1 tablet by mouth every night 4)  Synthroid 100 Mcg Tabs (Levothyroxine sodium) .Marland Kitchen.. 1 once daily 5)  Guiafen/hydrocodone Elixir 100/5  .Marland Kitchen.. 1-2 teaspoonfuls every 6 hours for cough 6)  Avelox 400 Mg Tabs (Moxifloxacin hcl)   Patient Instructions: 1)  Drink as much fluid as you can tolerate for the next few days. 2)  Take your antibiotic as prescribed until ALL of it is gone, but stop if you develop a rash or swelling and contact our office as soon as possible. 3)   call if worsening fever, shortness of breath    ]

## 2010-12-29 NOTE — Miscellaneous (Signed)
Summary: change in synthriod -[facility]  Medications Added SYNTHROID 112 MCG TABS (LEVOTHYROXINE SODIUM) qd       Clinical Lists Changes  Medications: Changed medication from SYNTHROID 100 MCG TABS (LEVOTHYROXINE SODIUM) 1 once daily to SYNTHROID 112 MCG TABS (LEVOTHYROXINE SODIUM) qd - Signed Rx of SYNTHROID 112 MCG TABS (LEVOTHYROXINE SODIUM) qd;  #90 x 3;  Signed;  Entered by: Duard Brady LPN;  Authorized by: Gordy Savers  MD;  Method used: Electronically to [facility] [place] , ,   , Ph: [phone], Fax: [phone]    Prescriptions: SYNTHROID 112 MCG TABS (LEVOTHYROXINE SODIUM) qd  #90 x 3   Entered by:   Duard Brady LPN   Authorized by:   Gordy Savers  MD   Signed by:   Duard Brady LPN on 96/29/5284   Method used:   Electronically to        [facility] (mail-order)             ,          Ph: [phone]       Fax: [phone]   RxID:   [id number]  RJJ

## 2010-12-29 NOTE — Assessment & Plan Note (Signed)
Summary: pt will come in fasting/njr   Vital Signs:  Patient profile:   77 year old female Height:      61.5 inches Weight:      155 pounds BMI:     28.92 Temp:     98.8 degrees F oral BP sitting:   110 / 78  (right arm) Cuff size:   regular  Vitals Entered By: Duard Brady LPN (February 13, 2010 8:20 AM) CC: cpx - doing ok , hip bothering her, fasting for labs Is Patient Diabetic? No   CC:  cpx - doing ok , hip bothering her, and fasting for labs.  History of Present Illness: 77 year old patient who is seen today for an extensive evaluation.  Medical problems include COPD, and coronary artery disease.  She has hypothyroidism and treated dyslipidemia.  She is doing quite well.  She denies any cardiopulmonary complaints.  She is on maintenance Advair, as well as rescue albuterol. remains on Lipitor, which he continues to tolerate well.  She is on Synthroid supplementation  Allergies: 1)  Levaquin (Levofloxacin) 2)  Sulfamethoxazole (Sulfamethoxazole)  Past History:  Past Medical History: COPD Hyperlipidemia Hypothyroidism Urosepsis 2004 macular degeneration Coronary artery disease  Past Surgical History: Reviewed history from 08/13/2009 and no changes required. D&C Vein stripping G4 P4 A0 sigmoidoscopy 2001 s/p cataract OD  colonoscopy 2009  Family History: Reviewed history from 09/18/2007 and no changes required. father died age 4 in my mother died 40 gynecologic cancer 4 brothers two sisters positive for colon cancer, coronary artery  disease, scleroderma  Social History: Reviewed history from 05/16/2009 and no changes required. Married d/c tobacco 1987  Review of Systems  The patient denies anorexia, fever, weight loss, weight gain, vision loss, decreased hearing, hoarseness, chest pain, syncope, dyspnea on exertion, peripheral edema, prolonged cough, headaches, hemoptysis, abdominal pain, melena, hematochezia, severe indigestion/heartburn,  hematuria, incontinence, genital sores, muscle weakness, suspicious skin lesions, transient blindness, difficulty walking, depression, unusual weight change, abnormal bleeding, enlarged lymph nodes, angioedema, and breast masses.    Physical Exam  General:  Well-developed,well-nourished,in no acute distress; alert,appropriate and cooperative throughout examination; 130/70 Head:  Normocephalic and atraumatic without obvious abnormalities. No apparent alopecia or balding. Eyes:  No corneal or conjunctival inflammation noted. EOMI. Perrla. Funduscopic exam benign, without hemorrhages, exudates or papilledema. Vision grossly normal. Ears:  External ear exam shows no significant lesions or deformities.  Otoscopic examination reveals clear canals, tympanic membranes are intact bilaterally without bulging, retraction, inflammation or discharge. Hearing is grossly normal bilaterally. Mouth:  Oral mucosa and oropharynx without lesions or exudates.  Teeth in good repair. Neck:  No deformities, masses, or tenderness noted. Chest Wall:  No deformities, masses, or tenderness noted. Breasts:  No mass, nodules, thickening, tenderness, bulging, retraction, inflamation, nipple discharge or skin changes noted.   Lungs:  Normal respiratory effort, chest expands symmetrically. Lungs are clear to auscultation, no crackles or wheezes. Heart:  Normal rate and regular rhythm. S1 and S2 normal without gallop, murmur, click, rub or other extra sounds. Abdomen:  Bowel sounds positive,abdomen soft and non-tender without masses, organomegaly or hernias noted. Rectal:  No external abnormalities noted. Normal sphincter tone. No rectal masses or tenderness. Genitalia:  Normal introitus for age, no external lesions, no vaginal discharge, mucosa pink and moist, no vaginal or cervical lesions, no vaginal atrophy, no friaility or hemorrhage, normal uterus size and position, no adnexal masses or tenderness Msk:  No deformity or  scoliosis noted of thoracic or lumbar spine.   Pulses:  dorsalis pedis pulses intact.  Posterior tibial pulse is not easily palpable Extremities:  No clubbing, cyanosis, edema, or deformity noted with normal full range of motion of all joints.   Neurologic:  No cranial nerve deficits noted. Station and gait are normal. Plantar reflexes are down-going bilaterally. DTRs are symmetrical throughout. Sensory, motor and coordinative functions appear intact. Skin:  Intact without suspicious lesions or rashes Cervical Nodes:  No lymphadenopathy noted Axillary Nodes:  No palpable lymphadenopathy Inguinal Nodes:  No significant adenopathy Psych:  Cognition and judgment appear intact. Alert and cooperative with normal attention span and concentration. No apparent delusions, illusions, hallucinations   Impression & Recommendations:  Problem # 1:  GERD (ICD-530.81)  Her updated medication list for this problem includes:    Pantoprazole Sodium 40 Mg Tbec (Pantoprazole sodium) ..... One daily  Her updated medication list for this problem includes:    Pantoprazole Sodium 40 Mg Tbec (Pantoprazole sodium) ..... One daily  Orders: TLB-BMP (Basic Metabolic Panel-BMET) (80048-METABOL) TLB-CBC Platelet - w/Differential (85025-CBCD) TLB-Hepatic/Liver Function Pnl (80076-HEPATIC)  Problem # 2:  CHRONIC OBSTRUCTIVE PULMONARY DISEASE, ACUTE EXACERBATION (ICD-491.21)  Orders: TLB-BMP (Basic Metabolic Panel-BMET) (80048-METABOL) TLB-CBC Platelet - w/Differential (85025-CBCD) TLB-Hepatic/Liver Function Pnl (80076-HEPATIC)  Problem # 3:  HYPOTHYROIDISM (ICD-244.9)  Her updated medication list for this problem includes:    Synthroid 100 Mcg Tabs (Levothyroxine sodium) .Marland Kitchen... 1 once daily  Her updated medication list for this problem includes:    Synthroid 100 Mcg Tabs (Levothyroxine sodium) .Marland Kitchen... 1 once daily  Orders: TLB-BMP (Basic Metabolic Panel-BMET) (80048-METABOL) TLB-CBC Platelet - w/Differential  (85025-CBCD) TLB-Hepatic/Liver Function Pnl (80076-HEPATIC) TLB-TSH (Thyroid Stimulating Hormone) (84443-TSH)  Problem # 4:  HYPERLIPIDEMIA (ICD-272.4)  Her updated medication list for this problem includes:    Lipitor 10 Mg Tabs (Atorvastatin calcium) .Marland Kitchen... Take 1 tablet by mouth every night  Her updated medication list for this problem includes:    Lipitor 10 Mg Tabs (Atorvastatin calcium) .Marland Kitchen... Take 1 tablet by mouth every night  Orders: Venipuncture (16109) TLB-Lipid Panel (80061-LIPID) TLB-BMP (Basic Metabolic Panel-BMET) (80048-METABOL) TLB-CBC Platelet - w/Differential (85025-CBCD) TLB-Hepatic/Liver Function Pnl (80076-HEPATIC)  Complete Medication List: 1)  Advair Diskus 500-50 Mcg/dose Misc (Fluticasone-salmeterol) .... Inhale 1 puff as directed twice a day 2)  Aspirin 81 Mg Chew (Aspirin) .... Take 1 tablet by mouth once a day 3)  Lipitor 10 Mg Tabs (Atorvastatin calcium) .... Take 1 tablet by mouth every night 4)  Synthroid 100 Mcg Tabs (Levothyroxine sodium) .Marland Kitchen.. 1 once daily 5)  Pantoprazole Sodium 40 Mg Tbec (Pantoprazole sodium) .... One daily 6)  Proventil Hfa 108 (90 Base) Mcg/act Aers (Albuterol sulfate) .... Two puffs every 6 hours as needed for shortness of breath  Other Orders: EKG w/ Interpretation (93000) Pelvic & Breast Exam ( Medicare)  (U0454) Prescription Created Electronically [phone])  Patient Instructions: 1)  Limit your Sodium (Salt). 2)  It is important that you exercise regularly at least 20 minutes 5 times a week. If you develop chest pain, have severe difficulty breathing, or feel very tired , stop exercising immediately and seek medical attention. 3)  Take an Aspirin every day. 4)  Take calcium +Vitamin D daily. Prescriptions: PROVENTIL HFA 108 (90 BASE) MCG/ACT AERS (ALBUTEROL SULFATE) two puffs every 6 hours as needed for shortness of breath  #3 x 6   Entered and Authorized by:   Gordy Savers  MD   Signed by:   Gordy Savers   MD on 02/13/2010   Method used:   Print then  Give to Patient   RxID:   [id number] PANTOPRAZOLE SODIUM 40 MG  TBEC (PANTOPRAZOLE SODIUM) one daily  #90 x 3   Entered and Authorized by:   Gordy Savers  MD   Signed by:   Gordy Savers  MD on 02/13/2010   Method used:   Print then Give to Patient   RxID:   [id number] SYNTHROID 100 MCG TABS (LEVOTHYROXINE SODIUM) 1 once daily  #90 x 6   Entered and Authorized by:   Gordy Savers  MD   Signed by:   Gordy Savers  MD on 02/13/2010   Method used:   Print then Give to Patient   RxID:   [id number] LIPITOR 10 MG TABS (ATORVASTATIN CALCIUM) Take 1 tablet by mouth every night  #90 x 6   Entered and Authorized by:   Gordy Savers  MD   Signed by:   Gordy Savers  MD on 02/13/2010   Method used:   Print then Give to Patient   RxID:   [id number] ADVAIR DISKUS 500-50 MCG/DOSE MISC (FLUTICASONE-SALMETEROL) Inhale 1 puff as directed twice a day  #3 x 6   Entered and Authorized by:   Gordy Savers  MD   Signed by:   Gordy Savers  MD on 02/13/2010   Method used:   Print then Give to Patient   RxID:   [id number] PROVENTIL HFA 108 (90 BASE) MCG/ACT AERS (ALBUTEROL SULFATE) two puffs every 6 hours as needed for shortness of breath  #3 x 6   Entered and Authorized by:   Gordy Savers  MD   Signed by:   Gordy Savers  MD on 02/13/2010   Method used:   Electronically to        [facility]* (mail-order)             ,          Ph: [phone]       Fax: [phone]   RxID:   [id number] PANTOPRAZOLE SODIUM 40 MG  TBEC (PANTOPRAZOLE SODIUM) one daily  #90 x 3   Entered and Authorized by:   Gordy Savers  MD   Signed by:   Gordy Savers  MD on 02/13/2010   Method used:   Electronically to        [facility]* (mail-order)             ,          Ph: [phone]       Fax: [phone]   RxID:   [id number] SYNTHROID 100 MCG TABS  (LEVOTHYROXINE SODIUM) 1 once daily  #90 x 6   Entered and Authorized by:   Gordy Savers  MD   Signed by:   Gordy Savers  MD on 02/13/2010   Method used:   Electronically to        [facility]* (mail-order)             ,          Ph: [phone]       Fax: [phone]   RxID:   [id number] LIPITOR 10 MG TABS (ATORVASTATIN CALCIUM) Take 1 tablet by mouth every night  #90 x 6   Entered and Authorized by:   Gordy Savers  MD   Signed by:   Gordy Savers  MD on 02/13/2010   Method used:   Electronically to  [facility]* (mail-order)             ,          Ph: [phone]       Fax: [phone]   RxID:   [id number] ADVAIR DISKUS 500-50 MCG/DOSE MISC (FLUTICASONE-SALMETEROL) Inhale 1 puff as directed twice a day  #3 x 6   Entered and Authorized by:   Gordy Savers  MD   Signed by:   Gordy Savers  MD on 02/13/2010   Method used:   Electronically to        [facility]* (mail-order)             ,          Ph: [phone]       Fax: [phone]   RxID:   [id number]

## 2011-02-17 ENCOUNTER — Encounter: Payer: Self-pay | Admitting: Internal Medicine

## 2011-02-18 ENCOUNTER — Ambulatory Visit (INDEPENDENT_AMBULATORY_CARE_PROVIDER_SITE_OTHER): Payer: Medicare Other | Admitting: Internal Medicine

## 2011-02-18 ENCOUNTER — Encounter: Payer: Self-pay | Admitting: Internal Medicine

## 2011-02-18 VITALS — BP 120/70 | HR 70 | Temp 98.2°F | Resp 16 | Ht 62.0 in | Wt 154.0 lb

## 2011-02-18 DIAGNOSIS — I2510 Atherosclerotic heart disease of native coronary artery without angina pectoris: Secondary | ICD-10-CM

## 2011-02-18 DIAGNOSIS — Z136 Encounter for screening for cardiovascular disorders: Secondary | ICD-10-CM

## 2011-02-18 DIAGNOSIS — J449 Chronic obstructive pulmonary disease, unspecified: Secondary | ICD-10-CM

## 2011-02-18 DIAGNOSIS — Z0000 Encounter for general adult medical examination without abnormal findings: Secondary | ICD-10-CM

## 2011-02-18 DIAGNOSIS — E039 Hypothyroidism, unspecified: Secondary | ICD-10-CM

## 2011-02-18 LAB — HEPATIC FUNCTION PANEL
ALT: 11 U/L (ref 0–35)
AST: 22 U/L (ref 0–37)
Albumin: 3.6 g/dL (ref 3.5–5.2)
Alkaline Phosphatase: 88 U/L (ref 39–117)
Bilirubin, Direct: 0.2 mg/dL (ref 0.0–0.3)
Total Bilirubin: 0.9 mg/dL (ref 0.3–1.2)
Total Protein: 6.3 g/dL (ref 6.0–8.3)

## 2011-02-18 LAB — BASIC METABOLIC PANEL
BUN: 18 mg/dL (ref 6–23)
CO2: 29 mEq/L (ref 19–32)
Calcium: 8.9 mg/dL (ref 8.4–10.5)
Chloride: 102 mEq/L (ref 96–112)
Creatinine, Ser: 0.7 mg/dL (ref 0.4–1.2)
GFR: 86.76 mL/min (ref >60.00)
Glucose, Bld: 90 mg/dL (ref 70–99)
Potassium: 4.3 mEq/L (ref 3.5–5.1)
Sodium: 139 mEq/L (ref 135–145)

## 2011-02-18 LAB — CBC WITH DIFFERENTIAL/PLATELET
Basophils Absolute: 0 10*3/uL (ref 0.0–0.1)
Basophils Relative: 0.7 % (ref 0.0–3.0)
Eosinophils Absolute: 0.3 10*3/uL (ref 0.0–0.7)
Eosinophils Relative: 4.4 % (ref 0.0–5.0)
HCT: 37.8 % (ref 36.0–46.0)
Hemoglobin: 12.8 g/dL (ref 12.0–15.0)
Lymphocytes Relative: 26.1 % (ref 12.0–46.0)
Lymphs Abs: 1.5 10*3/uL (ref 0.7–4.0)
MCHC: 33.9 g/dL (ref 30.0–36.0)
MCV: 93.2 fl (ref 78.0–100.0)
Monocytes Absolute: 0.6 10*3/uL (ref 0.1–1.0)
Monocytes Relative: 9.9 % (ref 3.0–12.0)
Neutro Abs: 3.5 10*3/uL (ref 1.4–7.7)
Neutrophils Relative %: 58.9 % (ref 43.0–77.0)
Platelets: 252 10*3/uL (ref 150.0–400.0)
RBC: 4.06 Mil/uL (ref 3.87–5.11)
RDW: 14.5 % (ref 11.5–14.6)
WBC: 5.9 10*3/uL (ref 4.5–10.5)

## 2011-02-18 LAB — LIPID PANEL
Cholesterol: 146 mg/dL (ref 0–200)
HDL: 58.3 mg/dL (ref >39.00)
LDL Cholesterol: 73 mg/dL (ref 0–99)
Total CHOL/HDL Ratio: 3
Triglycerides: 72 mg/dL (ref 0.0–149.0)
VLDL: 14.4 mg/dL (ref 0.0–40.0)

## 2011-02-18 LAB — TSH: TSH: 0.62 u[IU]/mL (ref 0.35–5.50)

## 2011-02-18 MED ORDER — ALBUTEROL SULFATE HFA 108 (90 BASE) MCG/ACT IN AERS: 2.0000 | INHALATION_SPRAY | Freq: Four times a day (QID) | RESPIRATORY_TRACT | Status: DC | PRN

## 2011-02-18 MED ORDER — PANTOPRAZOLE SODIUM 40 MG PO TBEC: 40.0000 mg | DELAYED_RELEASE_TABLET | Freq: Every day | ORAL | Status: DC

## 2011-02-18 MED ORDER — ATORVASTATIN CALCIUM 10 MG PO TABS: 10.0000 mg | ORAL_TABLET | Freq: Every day | ORAL | Status: DC

## 2011-02-18 MED ORDER — LEVOTHYROXINE SODIUM 112 MCG PO TABS: 112.0000 ug | ORAL_TABLET | Freq: Every day | ORAL | Status: DC

## 2011-02-18 MED ORDER — FLUTICASONE-SALMETEROL 500-50 MCG/DOSE IN AEPB: 1.0000 | INHALATION_SPRAY | Freq: Two times a day (BID) | RESPIRATORY_TRACT | Status: DC

## 2011-02-18 NOTE — Progress Notes (Signed)
Subjective:    Patient ID: Holly [name], female    DOB: 1928/06/11, 77 y.o.   MRN: [id number]  HPI  77 year old patient who is seen today for an annual health maintenance examination. She has a history of COPD dyslipidemia and hypothyroidism. She is doing quite well. No concerns or complaints.  1. Risk factors, based on past  M,S,F history-  Cardiovascular risk factors include dyslipidemia. Patient has known coronary artery disease which has been asymptomatic  2.  Physical activities: remains quite active physically does have mild COPD which has been well-controlled.  3.  Depression/mood: no history of depression or mood disorder  4.  Hearing: no deficits  5.  ADL's: independent in all aspects of daily living  6.  Fall risk: low 7.  Home safety: no problems identified  8.  Height weight, and visual acuity; height and weight stable no change in visual acuity  9.  Counseling: low-salt diet heart healthy diet encouraged as well as regular exercise  10. Lab orders based on risk factors: laboratory panel including lipid profile TSH will be reviewed  11. Referral : not appropriate at this time  12. Care plan: calcium vitamin D encouraged 13. Cognitive assessment:  Alert and oriented with normal affect. No cognitive dysfunction.     Allergies:  1) Levaquin (Levofloxacin)  2) Sulfamethoxazole (Sulfamethoxazole)  Past History:  Past Medical History:  COPD  Hyperlipidemia  Hypothyroidism  Urosepsis 2004  macular degeneration  Coronary artery disease  Past Surgical History:  Reviewed history from 08/13/2009 and no changes required.  D&C  Vein stripping  G4 P4 A0  sigmoidoscopy 2001  s/p cataract OD  colonoscopy 2009  Family History:  Reviewed history from 09/18/2007 and no changes required.  father died age 46 in my  mother died 80 gynecologic cancer  4 brothers two sisters  positive for colon cancer, coronary artery disease, scleroderma  Social History:  Reviewed  history from 05/16/2009 and no changes required.  Married  d/c tobacco 1987    Review of Systems  Constitutional: Negative for fever, appetite change, fatigue and unexpected weight change.  HENT: Negative for hearing loss, ear pain, nosebleeds, congestion, sore throat, mouth sores, trouble swallowing, neck stiffness, dental problem, voice change, sinus pressure and tinnitus.   Eyes: Negative for photophobia, pain, redness and visual disturbance.  Respiratory: Negative for cough, chest tightness and shortness of breath.   Cardiovascular: Negative for chest pain, palpitations and leg swelling.  Gastrointestinal: Negative for nausea, vomiting, abdominal pain, diarrhea, constipation, blood in stool, abdominal distention and rectal pain.  Genitourinary: Negative for dysuria, urgency, frequency, hematuria, flank pain, vaginal bleeding, vaginal discharge, difficulty urinating, genital sores, vaginal pain, menstrual problem and pelvic pain.  Musculoskeletal: Negative for back pain and arthralgias.  Skin: Negative for rash.  Neurological: Negative for dizziness, syncope, speech difficulty, weakness, light-headedness, numbness and headaches.  Hematological: Negative for adenopathy. Does not bruise/bleed easily.  Psychiatric/Behavioral: Negative for suicidal ideas, behavioral problems, self-injury, dysphoric mood and agitation. The patient is not nervous/anxious.        Objective:   Physical Exam  Constitutional: She is oriented to person, place, and time. She appears well-developed and well-nourished.  HENT:  Head: Normocephalic and atraumatic.  Right Ear: External ear normal.  Left Ear: External ear normal.  Mouth/Throat: Oropharynx is clear and moist.  Eyes: Conjunctivae and EOM are normal.  Neck: Normal range of motion. Neck supple. No JVD present. No thyromegaly present.  Cardiovascular: Normal rate, regular rhythm, normal heart sounds and  intact distal pulses.   No murmur heard.        Pedal  pulses full  Pulmonary/Chest: Effort normal and breath sounds normal. She has no wheezes. She has no rales.        Moderate kyphosis  Abdominal: Soft. Bowel sounds are normal. She exhibits no distension and no mass. There is no tenderness. There is no rebound and no guarding.  Genitourinary: Vagina normal.        Atrophic changes only  Musculoskeletal: Normal range of motion. She exhibits no edema and no tenderness.  Neurological: She is alert and oriented to person, place, and time. She has normal reflexes. No cranial nerve deficit. She exhibits normal muscle tone. Coordination normal.  Skin: Skin is warm and dry. No rash noted.  Psychiatric: She has a normal mood and affect. Her behavior is normal.          Assessment & Plan:   preventive health examination. Unremarkable exam we'll stress a low-salt heart healthy diet regular exercise.  COPD stable medications refilled  Hypothyroidism stable   Coronary artery disease asymptomatic patient has stable right bundle branch block which is unchanged

## 2011-02-18 NOTE — Patient Instructions (Signed)
Limit your sodium (Salt) intake    It is important that you exercise regularly, at least 20 minutes 3 to 4 times per week.  If you develop chest pain or shortness of breath seek  medical attention.  Take a calcium supplement, plus 800-1200 units of vitamin D  Return in 6 months for follow-up  

## 2011-04-14 ENCOUNTER — Other Ambulatory Visit: Payer: Self-pay | Admitting: *Deleted

## 2011-04-14 MED ORDER — FLUTICASONE-SALMETEROL 500-50 MCG/DOSE IN AEPB: 1.0000 | INHALATION_SPRAY | Freq: Two times a day (BID) | RESPIRATORY_TRACT | Status: DC

## 2011-04-14 NOTE — Telephone Encounter (Signed)
Last rx was printed - [facility]

## 2011-04-14 NOTE — Telephone Encounter (Signed)
Neeeds 90 day supply of Advair to [facility].

## 2011-04-16 NOTE — H&P (Signed)
Holly [name], Holly [name]                           ACCOUNT NO.:  [id number]   MEDICAL RECORD NO.:  [id number]                   PATIENT TYPE:  INP   LOCATION:  1824                                 FACILITY:  [facility]   PHYSICIAN:  Titus Dubin. Alwyn Ren, M.D. [facility]         DATE OF BIRTH:  [birth year]   DATE OF ADMISSION:  05/19/2003  DATE OF DISCHARGE:                                HISTORY & PHYSICAL   HISTORY OF PRESENT ILLNESS:  Holly [name] is a 77 year old white female  admitted with urosepsis.  She began to experience anorexia the evening of  May 17, 2003; she had gone out with a friend to eat, but had no appetite.  Throughout the day of May 18, 2003, she remained in bed.  There were no  specific complaints, although she just didn't feel good.  This was  manifested as a lack of energy subjectively and malaise.  When her son was  called to see her Saturday afternoon, he found her too weak to stand to put  on her pants to come to the emergency room.  In retrospect, she does mention  having had dysuria for at least a week.   PAST MEDICAL HISTORY:  Vein stripping.  She is G4, P4.   MEDICATIONS:  1. Synthroid.  2. Lipitor.  3. Multivitamins.  4. Advair as needed.   ALLERGIES:  She has no known drug allergies, but DARVON causes nausea.   SOCIAL HISTORY:  She has not smoked since 1987.  She does not drink.   FAMILY HISTORY:  Has no family history of heart attack or stroke.  Mother  had gynecologic cancer and diabetes.  Two brothers have diabetes.   REVIEW OF SYMPTOMS:  She describes a touch of asthma and emphysema with no  recent exacerbation.  In the emergency room, she said she did gag and spit  up some white phlegm.  She denies any purulence.  She denies chest pain,  abdominal pain, diarrhea, or other localizing symptoms of infection.   PHYSICAL EXAMINATION:  GENERAL:  She appears weak, but in no acute distress.  VITAL SIGNS:  Pulse is 96, respiratory rate 25.  At the time she was  admitted to the emergency room, her blood pressure was 121/59.  Despite  fluid boluses and IV fluids, her blood pressure is 89/37.  HEENT:  She is slightly hoarse.  There is the suggestion of possible slight  candidiasis over the hard palate.  She has no lymphadenopathy about the  head, neck, or axilla.  Thyroid reveals no significant changes.  CHEST:  Clear with no rhonchi.  She has a raspy grade .5 the 1 systolic  murmur across the precordium.  ABDOMEN:  Distended, and she describes tenderness in the suprapubic area.  She relates this to having to go to the bathroom, and she was requesting to  void at the time of the exam.  LYMPH NODES:  She  exhibits some lymphedema.  EXTREMITIES:  Pedal pulses are intact.  NEUROLOGIC:  She appears generally weak without localizing signs.   LABORATORY DATA:  Her BUN was 24, glucose 118, potassium 3.3.  Hematocrit  38.  These values were taken from the emergency room ISTAT.  White count  was 25,400 with 86% neutrophils with greater than 20% bands.  The corrected  glucose was 137.  Urine revealed large leukocyte esterase with too many  white cells to count.   Because of the hypotension, EKG and enzymes were drawn.  Enzymes are  pending.  EKG reveals normal sinus rhythm with no significant ST-T wave  changes.  There is poor R wave progression in V1 and V2, but no ischemic  changes present.   She will be admitted to the step-down unit and monitored.  Fluid challenge  will be continued.  She denies ingestion of steroids, and adrenal  insufficiency is not suspect.  The most likely etiology is a significant  urinary tract infection which has been brewing for well over a week and  has gone untreated, as she has not consulted a physician.   Her TSH will be checked.  Initially, hemoglobin A1C will be done because of  the family history of diabetes and the glucose of 136.                                               Titus Dubin. Alwyn Ren, M.D. [facility]     [facility]  D:  05/19/2003  T:  05/20/2003  Job:  [id number]   cc:   Gordy Savers, M.D. [facility]    cc:   Gordy Savers, M.D. [facility]

## 2011-06-28 ENCOUNTER — Ambulatory Visit (INDEPENDENT_AMBULATORY_CARE_PROVIDER_SITE_OTHER): Payer: Medicare Other | Admitting: Internal Medicine

## 2011-06-28 ENCOUNTER — Encounter: Payer: Self-pay | Admitting: Internal Medicine

## 2011-06-28 DIAGNOSIS — J441 Chronic obstructive pulmonary disease with (acute) exacerbation: Secondary | ICD-10-CM

## 2011-06-28 DIAGNOSIS — I2510 Atherosclerotic heart disease of native coronary artery without angina pectoris: Secondary | ICD-10-CM

## 2011-06-28 MED ORDER — AZITHROMYCIN 250 MG PO TABS: ORAL_TABLET | ORAL | Status: AC

## 2011-06-28 MED ORDER — HYDROCODONE-HOMATROPINE 5-1.5 MG/5ML PO SYRP: 5.0000 mL | ORAL_SOLUTION | Freq: Four times a day (QID) | ORAL | Status: DC | PRN

## 2011-06-28 NOTE — Progress Notes (Signed)
Subjective:    Patient ID: Holly [name], female    DOB: 11/17/1928, 77 y.o.   MRN: [id number]  HPI   77 year old patient who is seen today for followup. She has a history of coronary artery disease and COPD. She is on maintenance Advair and Proventil inhalational medication. For the past several days she has had increasing chest congestion and cough she states she is expectorating green thick sputum. There's been some increase in shortness of breath and weakness but no active wheezing. Denies any fever or chills. Her husband is recovering from a URI. Her coronary artery disease has been stable   Review of Systems  Constitutional: Positive for fatigue.  HENT: Negative for hearing loss, congestion, sore throat, rhinorrhea, dental problem, sinus pressure and tinnitus.   Eyes: Negative for pain, discharge and visual disturbance.  Respiratory: Positive for cough and shortness of breath.   Cardiovascular: Negative for chest pain, palpitations and leg swelling.  Gastrointestinal: Negative for nausea, vomiting, abdominal pain, diarrhea, constipation, blood in stool and abdominal distention.  Genitourinary: Negative for dysuria, urgency, frequency, hematuria, flank pain, vaginal bleeding, vaginal discharge, difficulty urinating, vaginal pain and pelvic pain.  Musculoskeletal: Negative for joint swelling, arthralgias and gait problem.  Skin: Negative for rash.  Neurological: Negative for dizziness, syncope, speech difficulty, weakness, numbness and headaches.  Hematological: Negative for adenopathy.  Psychiatric/Behavioral: Negative for behavioral problems, dysphoric mood and agitation. The patient is not nervous/anxious.        Objective:   Physical Exam  Constitutional: She is oriented to person, place, and time. She appears well-developed and well-nourished.  HENT:  Head: Normocephalic.  Right Ear: External ear normal.  Left Ear: External ear normal.  Mouth/Throat: Oropharynx is clear and  moist.  Eyes: Conjunctivae and EOM are normal. Pupils are equal, round, and reactive to light.  Neck: Normal range of motion. Neck supple. No thyromegaly present.  Cardiovascular: Normal rate, regular rhythm, normal heart sounds and intact distal pulses.   Pulmonary/Chest: Effort normal and breath sounds normal.  Abdominal: Soft. Bowel sounds are normal. She exhibits no mass. There is no tenderness.  Musculoskeletal: Normal range of motion.  Lymphadenopathy:    She has no cervical adenopathy.  Neurological: She is alert and oriented to person, place, and time.  Skin: Skin is warm and dry. No rash noted.  Psychiatric: She has a normal mood and affect. Her behavior is normal.          Assessment & Plan:   No problem-specific assessment & plan notes found for this encounter.  COPD exacerbation. We'll continue inhalational medications will treat with expectorants and azithromycin. Coronary artery disease stable

## 2011-06-28 NOTE — Patient Instructions (Signed)
Get plenty of rest, Drink lots of  clear liquids, and use Tylenol or ibuprofen for fever and discomfort.    Take your antibiotic as prescribed until ALL of it is gone, but stop if you develop a rash, swelling, or any side effects of the medication.  Contact our office as soon as possible if  there are side effects of the medication.  Call or return to clinic prn if these symptoms worsen or fail to improve as anticipated.   

## 2011-08-23 ENCOUNTER — Ambulatory Visit (INDEPENDENT_AMBULATORY_CARE_PROVIDER_SITE_OTHER): Payer: Medicare Other | Admitting: Internal Medicine

## 2011-08-23 ENCOUNTER — Encounter: Payer: Self-pay | Admitting: Internal Medicine

## 2011-08-23 DIAGNOSIS — E039 Hypothyroidism, unspecified: Secondary | ICD-10-CM

## 2011-08-23 DIAGNOSIS — J441 Chronic obstructive pulmonary disease with (acute) exacerbation: Secondary | ICD-10-CM

## 2011-08-23 DIAGNOSIS — E785 Hyperlipidemia, unspecified: Secondary | ICD-10-CM

## 2011-08-23 DIAGNOSIS — I2510 Atherosclerotic heart disease of native coronary artery without angina pectoris: Secondary | ICD-10-CM

## 2011-08-23 DIAGNOSIS — Z0000 Encounter for general adult medical examination without abnormal findings: Secondary | ICD-10-CM

## 2011-08-23 DIAGNOSIS — N390 Urinary tract infection, site not specified: Secondary | ICD-10-CM

## 2011-08-23 DIAGNOSIS — Z23 Encounter for immunization: Secondary | ICD-10-CM

## 2011-08-23 LAB — POCT URINALYSIS DIPSTICK
Bilirubin, UA: NEGATIVE
Glucose, UA: NEGATIVE
Ketones, UA: NEGATIVE
Nitrite, UA: POSITIVE
Spec Grav, UA: 1.02
Urobilinogen, UA: 0.2
pH, UA: 6.5

## 2011-08-23 MED ORDER — HYDROCODONE-HOMATROPINE 5-1.5 MG/5ML PO SYRP: 5.0000 mL | ORAL_SOLUTION | Freq: Four times a day (QID) | ORAL | Status: DC | PRN

## 2011-08-23 MED ORDER — NITROFURANTOIN MONOHYD MACRO 100 MG PO CAPS: 100.0000 mg | ORAL_CAPSULE | Freq: Two times a day (BID) | ORAL | Status: AC

## 2011-08-23 NOTE — Progress Notes (Signed)
Subjective:    Patient ID: Holly [name], female    DOB: 11-04-1928, 77 y.o.   MRN: [id number]  HPI  77 year old patient who is seen today for followup she has treated hypertension dyslipidemia and COPD. She has been stable. She has chronic but stable dyspnea on exertion. No active wheezing she has hypothyroidism and coronary artery disease she has some mild dyspnea exertion but no exertional chest pain. She complains of some lower abdominal discomfort and dysuria. She has gastroesophageal reflux disease which has been well-controlled on her present regimen. Denies any fever chills or flank pain  Review of Systems  Constitutional: Negative.   HENT: Negative for hearing loss, congestion, sore throat, rhinorrhea, dental problem, sinus pressure and tinnitus.   Eyes: Negative for pain, discharge and visual disturbance.  Respiratory: Positive for cough. Negative for shortness of breath.   Cardiovascular: Negative for chest pain, palpitations and leg swelling.  Gastrointestinal: Negative for nausea, vomiting, abdominal pain, diarrhea, constipation, blood in stool and abdominal distention.  Genitourinary: Positive for dysuria. Negative for urgency, frequency, hematuria, flank pain, vaginal bleeding, vaginal discharge, difficulty urinating, vaginal pain and pelvic pain.  Musculoskeletal: Negative for joint swelling, arthralgias and gait problem.  Skin: Negative for rash.  Neurological: Negative for dizziness, syncope, speech difficulty, weakness, numbness and headaches.  Hematological: Negative for adenopathy.  Psychiatric/Behavioral: Negative for behavioral problems, dysphoric mood and agitation. The patient is not nervous/anxious.        Objective:   Physical Exam  Constitutional: She is oriented to person, place, and time. She appears well-developed and well-nourished.  HENT:  Head: Normocephalic.  Right Ear: External ear normal.  Left Ear: External ear normal.  Mouth/Throat: Oropharynx is  clear and moist.  Eyes: Conjunctivae and EOM are normal. Pupils are equal, round, and reactive to light.  Neck: Normal range of motion. Neck supple. No thyromegaly present.  Cardiovascular: Normal rate, regular rhythm, normal heart sounds and intact distal pulses.   Pulmonary/Chest: Effort normal and breath sounds normal.       Mild kyphosis  Abdominal: Soft. Bowel sounds are normal. She exhibits no mass. There is no tenderness.  Musculoskeletal: Normal range of motion.  Lymphadenopathy:    She has no cervical adenopathy.  Neurological: She is alert and oriented to person, place, and time.  Skin: Skin is warm and dry. No rash noted.  Psychiatric: She has a normal mood and affect. Her behavior is normal.          Assessment & Plan:   UTI. Will treat with Macrobid for 7 days COPD stable we'll continue maintenance inhalational regimen Dyslipidemia Coronary artery disease  Low salt diet recommended Will see in 6 months for her annual exam

## 2011-08-23 NOTE — Patient Instructions (Signed)
Limit your sodium (Salt) intake    It is important that you exercise regularly, at least 20 minutes 3 to 4 times per week.  If you develop chest pain or shortness of breath seek  medical attention.  Return in 6 months for follow-up  Take your antibiotic as prescribed until ALL of it is gone, but stop if you develop a rash, swelling, or any side effects of the medication.  Contact our office as soon as possible if  there are side effects of the medication.

## 2011-08-25 ENCOUNTER — Telehealth: Payer: Self-pay | Admitting: *Deleted

## 2011-08-25 NOTE — Telephone Encounter (Signed)
Given daughter Dr. Charm Rings recommendations.

## 2011-08-25 NOTE — Telephone Encounter (Signed)
Pt's daughter called stating pt is running a fever of 100.4 with no complaints other than just fatigue.  No UTI symptoms, and is wondering about whether the flu shot might be causing this??  Advice?

## 2011-08-25 NOTE — Telephone Encounter (Signed)
Possible;  Take tylenol and observe; call if worsens

## 2011-08-27 ENCOUNTER — Telehealth: Payer: Self-pay | Admitting: *Deleted

## 2011-08-27 ENCOUNTER — Emergency Department (HOSPITAL_COMMUNITY)
Admission: EM | Admit: 2011-08-27 | Discharge: 2011-08-27 | Disposition: A | Discharge status: Home / Self Care | Payer: Medicare Other | Attending: Emergency Medicine | Admitting: Emergency Medicine

## 2011-08-27 DIAGNOSIS — E039 Hypothyroidism, unspecified: Secondary | ICD-10-CM | POA: Insufficient documentation

## 2011-08-27 DIAGNOSIS — J449 Chronic obstructive pulmonary disease, unspecified: Secondary | ICD-10-CM | POA: Insufficient documentation

## 2011-08-27 DIAGNOSIS — R110 Nausea: Secondary | ICD-10-CM | POA: Insufficient documentation

## 2011-08-27 DIAGNOSIS — E7800 Pure hypercholesterolemia, unspecified: Secondary | ICD-10-CM | POA: Insufficient documentation

## 2011-08-27 DIAGNOSIS — J4489 Other specified chronic obstructive pulmonary disease: Secondary | ICD-10-CM | POA: Insufficient documentation

## 2011-08-27 DIAGNOSIS — R5381 Other malaise: Secondary | ICD-10-CM | POA: Insufficient documentation

## 2011-08-27 DIAGNOSIS — R509 Fever, unspecified: Secondary | ICD-10-CM | POA: Insufficient documentation

## 2011-08-27 DIAGNOSIS — R300 Dysuria: Secondary | ICD-10-CM | POA: Insufficient documentation

## 2011-08-27 DIAGNOSIS — N390 Urinary tract infection, site not specified: Secondary | ICD-10-CM | POA: Insufficient documentation

## 2011-08-27 LAB — DIFFERENTIAL
Basophils Absolute: 0 10*3/uL (ref 0.0–0.1)
Basophils Relative: 0 % (ref 0–1)
Eosinophils Absolute: 0.6 10*3/uL (ref 0.0–0.7)
Eosinophils Relative: 10 % — ABNORMAL HIGH (ref 0–5)
Lymphocytes Relative: 15 % (ref 12–46)
Lymphs Abs: 0.9 10*3/uL (ref 0.7–4.0)
Monocytes Absolute: 0.8 10*3/uL (ref 0.1–1.0)
Monocytes Relative: 14 % — ABNORMAL HIGH (ref 3–12)
Neutro Abs: 3.6 10*3/uL (ref 1.7–7.7)
Neutrophils Relative %: 61 % (ref 43–77)

## 2011-08-27 LAB — CBC
HCT: 42.7 % (ref 36.0–46.0)
Hemoglobin: 14.4 g/dL (ref 12.0–15.0)
MCH: 30.4 pg (ref 26.0–34.0)
MCHC: 33.7 g/dL (ref 30.0–36.0)
MCV: 90.3 fL (ref 78.0–100.0)
Platelets: ADEQUATE 10*3/uL (ref 150–400)
RBC: 4.73 MIL/uL (ref 3.87–5.11)
RDW: 15.1 % (ref 11.5–15.5)
WBC: 5.9 10*3/uL (ref 4.0–10.5)

## 2011-08-27 LAB — BASIC METABOLIC PANEL
BUN: 18 mg/dL (ref 6–23)
CO2: 26 mEq/L (ref 19–32)
Calcium: 9.1 mg/dL (ref 8.4–10.5)
Chloride: 99 mEq/L (ref 96–112)
Creatinine, Ser: 0.85 mg/dL (ref 0.50–1.10)
GFR calc Af Amer: >60 mL/min (ref >60)
GFR calc non Af Amer: >60 mL/min (ref >60)
Glucose, Bld: 89 mg/dL (ref 70–99)
Potassium: 5.3 mEq/L — ABNORMAL HIGH (ref 3.5–5.1)
Sodium: 135 mEq/L (ref 135–145)

## 2011-08-27 LAB — URINALYSIS, ROUTINE W REFLEX MICROSCOPIC
Glucose, UA: NEGATIVE mg/dL
Hgb urine dipstick: NEGATIVE
Ketones, ur: 15 mg/dL — AB
Nitrite: NEGATIVE
Protein, ur: 30 mg/dL — AB
Specific Gravity, Urine: 1.026 (ref 1.005–1.030)
Urobilinogen, UA: 1 mg/dL (ref 0.0–1.0)
pH: 6 (ref 5.0–8.0)

## 2011-08-27 LAB — URINE MICROSCOPIC-ADD ON

## 2011-08-27 NOTE — Telephone Encounter (Signed)
Agree with advice for prompt ER evaluation.

## 2011-08-27 NOTE — Telephone Encounter (Signed)
Daughter [name] [facility]) is calling stating Pt's temp this am was 103.4, with no real complaints.  Advised ER ASAP for FUO evaluation.

## 2011-08-30 ENCOUNTER — Ambulatory Visit: Payer: Medicare Other | Admitting: Internal Medicine

## 2011-09-02 ENCOUNTER — Encounter: Payer: Self-pay | Admitting: Internal Medicine

## 2011-09-02 ENCOUNTER — Ambulatory Visit (INDEPENDENT_AMBULATORY_CARE_PROVIDER_SITE_OTHER): Payer: Medicare Other | Admitting: Internal Medicine

## 2011-09-02 DIAGNOSIS — J441 Chronic obstructive pulmonary disease with (acute) exacerbation: Secondary | ICD-10-CM

## 2011-09-02 DIAGNOSIS — J449 Chronic obstructive pulmonary disease, unspecified: Secondary | ICD-10-CM

## 2011-09-02 NOTE — Patient Instructions (Signed)
Take your antibiotic as prescribed until ALL of it is gone, but stop if you develop a rash, swelling, or any side effects of the medication.  Contact our office as soon as possible if  there are side effects of the medication.  Call or return to clinic prn if these symptoms worsen or fail to improve as anticipated.  

## 2011-09-02 NOTE — Progress Notes (Signed)
Subjective:    Patient ID: Holly [name], female    DOB: 06-Dec-1927, 77 y.o.   MRN: [id number]  HPI  an 77 year old patient who was seen here recently and treated with Macrobid for an uncomplicated UTI the patient was seen in the emergency room 6 days ago with fever weakness and fatigue. There had been some dysuria fever but no chills or flank pain and associated nausea. The patient was felt to have a UTI refractory to Macrobid and the patient has done well on Cipro. All her symptoms are resolved. ER records are reviewed including laboratory studies the patient thought that her potassium level was low but review of [facility] records revealed a potassium level of 5.3    Review of Systems  Constitutional: Negative.   HENT: Negative for hearing loss, congestion, sore throat, rhinorrhea, dental problem, sinus pressure and tinnitus.   Eyes: Negative for pain, discharge and visual disturbance.  Respiratory: Negative for cough and shortness of breath.   Cardiovascular: Negative for chest pain, palpitations and leg swelling.  Gastrointestinal: Negative for nausea, vomiting, abdominal pain, diarrhea, constipation, blood in stool and abdominal distention.  Genitourinary: Negative for dysuria, urgency, frequency, hematuria, flank pain, vaginal bleeding, vaginal discharge, difficulty urinating, vaginal pain and pelvic pain.  Musculoskeletal: Negative for joint swelling, arthralgias and gait problem.  Skin: Negative for rash.  Neurological: Negative for dizziness, syncope, speech difficulty, weakness, numbness and headaches.  Hematological: Negative for adenopathy.  Psychiatric/Behavioral: Negative for behavioral problems, dysphoric mood and agitation. The patient is not nervous/anxious.        Objective:   Physical Exam  Constitutional: She is oriented to person, place, and time. She appears well-developed and well-nourished.  HENT:  Head: Normocephalic.  Right Ear: External ear normal.  Left Ear:  External ear normal.  Mouth/Throat: Oropharynx is clear and moist.  Eyes: Conjunctivae and EOM are normal. Pupils are equal, round, and reactive to light.  Neck: Normal range of motion. Neck supple. No thyromegaly present.  Cardiovascular: Normal rate, regular rhythm, normal heart sounds and intact distal pulses.   Pulmonary/Chest: Effort normal and breath sounds normal.  Abdominal: Soft. Bowel sounds are normal. She exhibits no mass. There is no tenderness.       No CVA or suprapubic tenderness  Musculoskeletal: Normal range of motion.  Lymphadenopathy:    She has no cervical adenopathy.  Neurological: She is alert and oriented to person, place, and time.  Skin: Skin is warm and dry. No rash noted.  Psychiatric: She has a normal mood and affect. Her behavior is normal.          Assessment & Plan:    status post UTI COPD  The patient will complete her round of Cipro. Return here as scheduled in the future. She will report any clinical change

## 2011-10-27 ENCOUNTER — Telehealth: Payer: Self-pay | Admitting: Internal Medicine

## 2011-10-27 NOTE — Telephone Encounter (Signed)
Spoke with daughter - congestion and cough , pt wanting to go out of town for 2wks Saturday - last seen 08/2011 - needs to be seen to eval and tx- appt made for tomorrow 145pm

## 2011-10-27 NOTE — Telephone Encounter (Signed)
Husband saw dr fry with congestion. Wants to see Dr Kirtland Bouchard on Friday with the same sxs. Please return daughter's call. Thanks.

## 2011-10-28 ENCOUNTER — Ambulatory Visit (INDEPENDENT_AMBULATORY_CARE_PROVIDER_SITE_OTHER): Payer: Medicare Other | Admitting: Internal Medicine

## 2011-10-28 ENCOUNTER — Encounter: Payer: Self-pay | Admitting: Internal Medicine

## 2011-10-28 VITALS — BP 112/70 | HR 97 | Temp 98.6°F | Wt 153.0 lb

## 2011-10-28 DIAGNOSIS — J441 Chronic obstructive pulmonary disease with (acute) exacerbation: Secondary | ICD-10-CM

## 2011-10-28 MED ORDER — AZITHROMYCIN 250 MG PO TABS: ORAL_TABLET | ORAL | Status: DC

## 2011-10-28 NOTE — Patient Instructions (Signed)
Drink as much fluid as you  can tolerate over the next few days  Mucinex-   one tablet twice daily  Get plenty of rest, Drink lots of  clear liquids, and use Tylenol or ibuprofen for fever and discomfort.    Call or return to clinic prn if these symptoms worsen or fail to improve as anticipated.

## 2011-10-28 NOTE — Progress Notes (Signed)
Subjective:    Patient ID: Holly [name], female    DOB: 12/28/1927, 77 y.o.   MRN: [id number]  HPI  77 year old patient who is seen today for followup. She presents with a two-day history of fever increasing chest congestion and cough. Her husband was seen here 3 days ago and treated for an acute urinary tract infection. He is improving on azithromycin. Patient has significant COPD. She denies any increasing wheezing or shortness of breath. She has had fever to 102.    Review of Systems  Constitutional: Negative.   HENT: Negative for hearing loss, congestion, sore throat, rhinorrhea, dental problem, sinus pressure and tinnitus.   Eyes: Negative for pain, discharge and visual disturbance.  Respiratory: Negative for cough and shortness of breath.   Cardiovascular: Negative for chest pain, palpitations and leg swelling.  Gastrointestinal: Negative for nausea, vomiting, abdominal pain, diarrhea, constipation, blood in stool and abdominal distention.  Genitourinary: Negative for dysuria, urgency, frequency, hematuria, flank pain, vaginal bleeding, vaginal discharge, difficulty urinating, vaginal pain and pelvic pain.  Musculoskeletal: Negative for joint swelling, arthralgias and gait problem.  Skin: Negative for rash.  Neurological: Negative for dizziness, syncope, speech difficulty, weakness, numbness and headaches.  Hematological: Negative for adenopathy.  Psychiatric/Behavioral: Negative for behavioral problems, dysphoric mood and agitation. The patient is not nervous/anxious.        Objective:   Physical Exam  Constitutional: She is oriented to person, place, and time. She appears well-developed and well-nourished.  HENT:  Head: Normocephalic.  Right Ear: External ear normal.  Left Ear: External ear normal.  Mouth/Throat: Oropharynx is clear and moist.  Eyes: Conjunctivae and EOM are normal. Pupils are equal, round, and reactive to light.  Neck: Normal range of motion. Neck supple.  No thyromegaly present.  Cardiovascular: Normal rate, regular rhythm, normal heart sounds and intact distal pulses.   Pulmonary/Chest: Effort normal and breath sounds normal. No respiratory distress. She has no wheezes. She has no rales. She exhibits no tenderness.       No respiratory  Distress Oxygen saturation 96% Pulse rate 78 Rare scattered rhonchi No wheezing   Abdominal: Soft. Bowel sounds are normal. She exhibits no mass. There is no tenderness.  Musculoskeletal: Normal range of motion.  Lymphadenopathy:    She has no cervical adenopathy.  Neurological: She is alert and oriented to person, place, and time.  Skin: Skin is warm and dry. No rash noted.  Psychiatric: She has a normal mood and affect. Her behavior is normal.          Assessment & Plan:   Viral URI in the setting of COPD. We'll give a prescription for azithromycin to take if she develops worsening wheezing fever or productive cough. We'll continue inhalational medications add Mucinex COPD

## 2012-02-22 ENCOUNTER — Ambulatory Visit (INDEPENDENT_AMBULATORY_CARE_PROVIDER_SITE_OTHER): Payer: Medicare Other | Admitting: Internal Medicine

## 2012-02-22 ENCOUNTER — Encounter: Payer: Self-pay | Admitting: Internal Medicine

## 2012-02-22 VITALS — BP 120/70 | HR 76 | Temp 97.9°F | Resp 20 | Wt 150.0 lb

## 2012-02-22 DIAGNOSIS — E785 Hyperlipidemia, unspecified: Secondary | ICD-10-CM | POA: Diagnosis not present

## 2012-02-22 DIAGNOSIS — Z0000 Encounter for general adult medical examination without abnormal findings: Secondary | ICD-10-CM

## 2012-02-22 DIAGNOSIS — E039 Hypothyroidism, unspecified: Secondary | ICD-10-CM

## 2012-02-22 DIAGNOSIS — J441 Chronic obstructive pulmonary disease with (acute) exacerbation: Secondary | ICD-10-CM

## 2012-02-22 DIAGNOSIS — L0291 Cutaneous abscess, unspecified: Secondary | ICD-10-CM

## 2012-02-22 DIAGNOSIS — L0390 Cellulitis, unspecified: Secondary | ICD-10-CM

## 2012-02-22 DIAGNOSIS — I2510 Atherosclerotic heart disease of native coronary artery without angina pectoris: Secondary | ICD-10-CM | POA: Diagnosis not present

## 2012-02-22 LAB — COMPREHENSIVE METABOLIC PANEL
ALT: 17 U/L (ref 0–35)
AST: 27 U/L (ref 0–37)
Albumin: 3.8 g/dL (ref 3.5–5.2)
Alkaline Phosphatase: 94 U/L (ref 39–117)
BUN: 15 mg/dL (ref 6–23)
CO2: 27 mEq/L (ref 19–32)
Calcium: 9.3 mg/dL (ref 8.4–10.5)
Chloride: 103 mEq/L (ref 96–112)
Creatinine, Ser: 0.8 mg/dL (ref 0.4–1.2)
GFR: 75.13 mL/min (ref >60.00)
Glucose, Bld: 91 mg/dL (ref 70–99)
Potassium: 4.3 mEq/L (ref 3.5–5.1)
Sodium: 139 mEq/L (ref 135–145)
Total Bilirubin: 0.6 mg/dL (ref 0.3–1.2)
Total Protein: 7.3 g/dL (ref 6.0–8.3)

## 2012-02-22 LAB — TSH: TSH: 0.93 u[IU]/mL (ref 0.35–5.50)

## 2012-02-22 LAB — CBC WITH DIFFERENTIAL/PLATELET
Basophils Absolute: 0 10*3/uL (ref 0.0–0.1)
Basophils Relative: 0.8 % (ref 0.0–3.0)
Eosinophils Absolute: 0.2 10*3/uL (ref 0.0–0.7)
Eosinophils Relative: 3.7 % (ref 0.0–5.0)
HCT: 41.3 % (ref 36.0–46.0)
Hemoglobin: 13.6 g/dL (ref 12.0–15.0)
Lymphocytes Relative: 33.5 % (ref 12.0–46.0)
Lymphs Abs: 1.7 10*3/uL (ref 0.7–4.0)
MCHC: 32.9 g/dL (ref 30.0–36.0)
MCV: 93.3 fl (ref 78.0–100.0)
Monocytes Absolute: 0.6 10*3/uL (ref 0.1–1.0)
Monocytes Relative: 12.3 % — ABNORMAL HIGH (ref 3.0–12.0)
Neutro Abs: 2.6 10*3/uL (ref 1.4–7.7)
Neutrophils Relative %: 49.7 % (ref 43.0–77.0)
Platelets: 270 10*3/uL (ref 150.0–400.0)
RBC: 4.42 Mil/uL (ref 3.87–5.11)
RDW: 15.6 % — ABNORMAL HIGH (ref 11.5–14.6)
WBC: 5.2 10*3/uL (ref 4.5–10.5)

## 2012-02-22 LAB — LIPID PANEL
Cholesterol: 147 mg/dL (ref 0–200)
HDL: 58.5 mg/dL (ref >39.00)
LDL Cholesterol: 65 mg/dL (ref 0–99)
Total CHOL/HDL Ratio: 3
Triglycerides: 116 mg/dL (ref 0.0–149.0)
VLDL: 23.2 mg/dL (ref 0.0–40.0)

## 2012-02-22 MED ORDER — LEVOTHYROXINE SODIUM 112 MCG PO TABS: 112.0000 ug | ORAL_TABLET | Freq: Every day | ORAL | Status: DC

## 2012-02-22 MED ORDER — PANTOPRAZOLE SODIUM 40 MG PO TBEC: 40.0000 mg | DELAYED_RELEASE_TABLET | Freq: Every day | ORAL | Status: DC

## 2012-02-22 MED ORDER — ALBUTEROL SULFATE HFA 108 (90 BASE) MCG/ACT IN AERS: 2.0000 | INHALATION_SPRAY | Freq: Four times a day (QID) | RESPIRATORY_TRACT | Status: DC | PRN

## 2012-02-22 MED ORDER — FLUTICASONE-SALMETEROL 500-50 MCG/DOSE IN AEPB: 1.0000 | INHALATION_SPRAY | Freq: Two times a day (BID) | RESPIRATORY_TRACT | Status: DC

## 2012-02-22 MED ORDER — HYDROCOD POLST-CHLORPHEN POLST 10-8 MG/5ML PO LQCR: 5.0000 mL | Freq: Two times a day (BID) | ORAL | Status: DC | PRN

## 2012-02-22 MED ORDER — HYDROCODONE-HOMATROPINE 5-1.5 MG/5ML PO SYRP: 5.0000 mL | ORAL_SOLUTION | Freq: Four times a day (QID) | ORAL | Status: DC | PRN

## 2012-02-22 MED ORDER — ATORVASTATIN CALCIUM 10 MG PO TABS: 10.0000 mg | ORAL_TABLET | Freq: Every day | ORAL | Status: DC

## 2012-02-22 NOTE — Patient Instructions (Signed)
Limit your sodium (Salt) intake    It is important that you exercise regularly, at least 20 minutes 3 to 4 times per week.  If you develop chest pain or shortness of breath seek  medical attention.  Return in 6 months for follow-up  

## 2012-02-22 NOTE — Progress Notes (Signed)
Subjective:    Patient ID: Holly [name], female    DOB: February 12, 1928, 78 y.o.   MRN: [id number]  HPI  78 year old patient who is in today for a preventive health examination. Medical problems include coronary artery disease as well as COPD. She has been quite stable. She has treated hypothyroidism and also a history of dyslipidemia.  1. Risk factors, based on past  M,S,F history- patient has known coronary artery disease cardiovascular risk factors include dyslipidemia and remote tobacco use  2.  Physical activities: Pulmonary status is stable no significant exercise limitations  3.  Depression/mood: No history of depression or mood disorder  4.  Hearing: No significant deficits  5.  ADL's: Independent in all aspects of daily living  6.  Fall risk: Low 7.  Home safety: No problems identified  8.  Height weight, and visual acuity; height and weight stable. Has a history of macular degeneration and is followed by ophthalmology  9.  Counseling: Regular exercise low salt diet all encouraged  10. Lab orders based on risk factors: Laboratory profile including lipid panel and TSH will be reviewed  11. Referral : Followup ophthalmology  12. Care plan: Followup ophthalmology recheck here in 6 months for flu vaccine will be administered at that time  13. Cognitive assessment: Alert and appropriate with normal affect. No cognitive dysfunction.     Allergies:  1) Levaquin (Levofloxacin)  2) Sulfamethoxazole (Sulfamethoxazole)   Past History:  Past Medical History:  COPD  Hyperlipidemia  Hypothyroidism  Urosepsis 2004  macular degeneration  Coronary artery disease   Past Surgical History:  Reviewed history from 08/13/2009 and no changes required.  D&C  Vein stripping  G4 P4 A0  sigmoidoscopy 2001  s/p cataract OD  colonoscopy 2009   Family History:  Reviewed history from 09/18/2007 and no changes required.  father died age 92 in my  mother died 27 gynecologic cancer  4  brothers two sisters  positive for colon cancer, coronary artery disease, scleroderma   Social History:  Reviewed history from 05/16/2009 and no changes required.  Married  d/c tobacco 1987    Review of Systems  Constitutional: Negative.   HENT: Negative for hearing loss, congestion, sore throat, rhinorrhea, dental problem, sinus pressure and tinnitus.   Eyes: Negative for pain, discharge and visual disturbance.  Respiratory: Positive for cough. Negative for shortness of breath.   Cardiovascular: Negative for chest pain, palpitations and leg swelling.  Gastrointestinal: Negative for nausea, vomiting, abdominal pain, diarrhea, constipation, blood in stool and abdominal distention.  Genitourinary: Negative for dysuria, urgency, frequency, hematuria, flank pain, vaginal bleeding, vaginal discharge, difficulty urinating, vaginal pain and pelvic pain.  Musculoskeletal: Negative for joint swelling, arthralgias and gait problem.  Skin: Negative for rash.  Neurological: Negative for dizziness, syncope, speech difficulty, weakness, numbness and headaches.  Hematological: Negative for adenopathy.  Psychiatric/Behavioral: Negative for behavioral problems, dysphoric mood and agitation. The patient is not nervous/anxious.        Objective:   Physical Exam  Constitutional: She is oriented to person, place, and time. She appears well-developed and well-nourished.  HENT:  Head: Normocephalic and atraumatic.  Right Ear: External ear normal.  Left Ear: External ear normal.  Mouth/Throat: Oropharynx is clear and moist.  Eyes: Conjunctivae and EOM are normal.  Neck: Normal range of motion. Neck supple. No JVD present. No thyromegaly present.  Cardiovascular: Normal rate, regular rhythm, normal heart sounds and intact distal pulses.   No murmur heard. Pulmonary/Chest: Effort normal and breath sounds  normal. She has no wheezes. She has no rales.  Abdominal: Soft. Bowel sounds are normal. She exhibits  no distension and no mass. There is no tenderness. There is no rebound and no guarding.  Musculoskeletal: Normal range of motion. She exhibits no edema and no tenderness.  Neurological: She is alert and oriented to person, place, and time. She has normal reflexes. No cranial nerve deficit. She exhibits normal muscle tone. Coordination normal.  Skin: Skin is warm and dry. No rash noted.  Psychiatric: She has a normal mood and affect. Her behavior is normal.          Assessment & Plan:   Preventive health examination Hypothyroidism. TSH will be reviewed Dyslipidemia lipid profile reviewed COPD stable we'll continue present inhalational medications. Recheck in 6 months for a flu vaccine in

## 2012-03-07 DIAGNOSIS — H35059 Retinal neovascularization, unspecified, unspecified eye: Secondary | ICD-10-CM | POA: Diagnosis not present

## 2012-03-07 DIAGNOSIS — H353290 Exudative age-related macular degeneration, unspecified eye, stage unspecified: Secondary | ICD-10-CM | POA: Diagnosis not present

## 2012-03-10 ENCOUNTER — Ambulatory Visit (INDEPENDENT_AMBULATORY_CARE_PROVIDER_SITE_OTHER): Payer: Medicare Other | Admitting: Family

## 2012-03-10 ENCOUNTER — Encounter: Payer: Self-pay | Admitting: Family

## 2012-03-10 VITALS — BP 120/80 | Temp 98.4°F | Wt 150.0 lb

## 2012-03-10 DIAGNOSIS — R350 Frequency of micturition: Secondary | ICD-10-CM | POA: Diagnosis not present

## 2012-03-10 DIAGNOSIS — N3000 Acute cystitis without hematuria: Secondary | ICD-10-CM

## 2012-03-10 LAB — POCT URINALYSIS DIPSTICK
Bilirubin, UA: NEGATIVE
Blood, UA: NEGATIVE
Glucose, UA: NEGATIVE
Ketones, UA: NEGATIVE
Nitrite, UA: NEGATIVE
Protein, UA: NEGATIVE
Spec Grav, UA: 1.015
Urobilinogen, UA: 0.2
pH, UA: 6

## 2012-03-10 MED ORDER — NITROFURANTOIN MONOHYD MACRO 100 MG PO CAPS: 100.0000 mg | ORAL_CAPSULE | Freq: Two times a day (BID) | ORAL | Status: AC

## 2012-03-10 NOTE — Patient Instructions (Signed)

## 2012-03-10 NOTE — Progress Notes (Signed)
Subjective:    Patient ID: Holly [name], female    DOB: [birth year], 78 y.o.   MRN: [id number]  HPI Comments: 78yo white female presents with c/o burning, urgency, frequency, and abdominal pressure with urination x one day. Denies fever, chills, or noticeable blood in urine. Consumes coffee and tea throughout the day and does not drink water. Denies any previous know incident bladder infection. Has not used any used OTC products: douches or bubble bath.      Review of Systems  Constitutional: Negative.   Respiratory: Negative.   Cardiovascular: Negative.   Genitourinary: Positive for dysuria, urgency and frequency. Negative for hematuria, flank pain, decreased urine volume, vaginal bleeding, vaginal discharge, enuresis, difficulty urinating, genital sores, vaginal pain, menstrual problem, pelvic pain and dyspareunia.  Neurological: Negative.    Past Medical History  Diagnosis Date  . CELLULITIS 06/18/2008  . CHRONIC OBSTRUCTIVE PULMONARY DISEASE, ACUTE EXACERBATION 11/16/2007  . COPD 05/18/2007  . GERD 12/25/2007  . HYPERLIPIDEMIA 05/18/2007  . HYPOTHYROIDISM 05/18/2007  . Urosepsis   . Macular degeneration   . CHF (congestive heart failure)   . CAD (coronary artery disease)     History   Social History  . Marital Status: Married    Spouse Name: N/A    Number of Children: N/A  . Years of Education: N/A   Occupational History  . Not on file.   Social History Main Topics  . Smoking status: Former Smoker    Quit date: 11/29/1985  . Smokeless tobacco: Never Used  . Alcohol Use: No  . Drug Use: No  . Sexually Active: Not on file   Other Topics Concern  . Not on file   Social History Narrative  . No narrative on file    Past Surgical History  Procedure Date  . Cataract extraction   . Varicose vein surgery   . Dilation and curettage of uterus     No family history on file.  Allergies  Allergen Reactions  . Levofloxacin     REACTION: unspecified  .  Sulfamethoxazole     REACTION: unspecified    Current Outpatient Prescriptions on File Prior to Visit  Medication Sig Dispense Refill  . albuterol (PROVENTIL HFA) 108 (90 BASE) MCG/ACT inhaler Inhale 2 puffs into the lungs every 6 (six) hours as needed.  3 Inhaler  6  . aspirin 81 MG tablet Take 81 mg by mouth daily.        Marland Kitchen atorvastatin (LIPITOR) 10 MG tablet Take 1 tablet (10 mg total) by mouth daily.  90 tablet  6  . chlorpheniramine-HYDROcodone (TUSSIONEX PENNKINETIC ER) 10-8 MG/5ML LQCR Take 5 mLs by mouth every 12 (twelve) hours as needed.  60 mL  0  . Fluticasone-Salmeterol (ADVAIR DISKUS) 500-50 MCG/DOSE AEPB Inhale 1 puff into the lungs every 12 (twelve) hours.  180 each  3  . levothyroxine (SYNTHROID, LEVOTHROID) 112 MCG tablet Take 1 tablet (112 mcg total) by mouth daily.  90 tablet  6  . pantoprazole (PROTONIX) 40 MG tablet Take 1 tablet (40 mg total) by mouth daily.  90 tablet  6    BP 120/80  Temp(Src) 98.4 F (36.9 C) (Oral)  Wt 150 lb (68.04 kg)chart    Objective:   Physical Exam  Constitutional: She is oriented to person, place, and time. She appears well-developed and well-nourished. No distress.  Cardiovascular: Normal rate, regular rhythm, normal heart sounds and intact distal pulses.  Exam reveals no gallop and no friction rub.   No  murmur heard. Pulmonary/Chest: Effort normal and breath sounds normal. No respiratory distress. She has no wheezes. She has no rales. She exhibits no tenderness.  Abdominal: Soft. Bowel sounds are normal. She exhibits no distension and no mass. There is no tenderness. There is no rebound and no guarding.  Neurological: She is alert and oriented to person, place, and time.  Skin: Skin is warm and dry. She is not diaphoretic.          Assessment & Plan:  Assessment: Uncomplicated urinary cystitis Plan: Macrobid, urinalysis, consume water and decrease caffeine intake. Teaching handout on diagnosis and treatment provided. Encouraged  to RTC if s/s get worse.

## 2012-03-13 ENCOUNTER — Telehealth: Payer: Self-pay | Admitting: Family Medicine

## 2012-03-13 NOTE — Telephone Encounter (Signed)
[facility] Triage Call Report Triage Record Num: [id number] Operator: Baldomero Lamy Patient Name: Holly [name] Call Date & Time: 03/11/2012 11:16:40AM Patient Phone: [phone] PCP: Hannah Beat Patient Gender: Female PCP Fax : [phone] Patient DOB: Jan 23, 1928 [facility] Name[facility] Reason for Call: [name]; PCP: Other; CB#: [phone]; Call regarding Fever and UTI ; Daughter/Holly [name] calling regarding pt running a temp of 101.9 and vomiting all night. Pt seen on 4/12 for Acute Cystitis and prescribed Macrobid. Pt has not tolerated antibx well and only taking half doses of Tylenol sporadically. Spoke with Dr. Patsy Lager: advised a change in RX from Macrobid to Omnicef 300 mg: 2 caps po one time daily for 7 days-#14 and Zofran 8 mg ODT po Q 8 hrs prn for nausea and vomiting-#20. RA. Called in to [phone]-spoke to Ron, Rph. RA. Advised Holly [name]/daughter to fill RX asap, give Zofran as prescribed 1st,then give 1st dose of Omnicef (pt not had Macrobid today, 4/13) with small meal or crackers and give pt Tylenol Q 8 hrs per pkg directions and to take full recommended dose. Advised to take pt to ER immediately for assessment if temp and/or vomiting persists today or before if pt sxs become worse. Holly [name] verbalized understanding. Protocol(s) Used: Information Only Call; No Symptom Triage (Adult) Recommended Outcome per Protocol: Provide Information or Advice Only Reason for Outcome: Follow-up call to recent contact; no triage required. Information provided from past call documentation, approved references or experience. Care Advice: ~ 04/

## 2012-03-21 DIAGNOSIS — H353290 Exudative age-related macular degeneration, unspecified eye, stage unspecified: Secondary | ICD-10-CM | POA: Diagnosis not present

## 2012-04-14 ENCOUNTER — Other Ambulatory Visit: Payer: Self-pay | Admitting: Internal Medicine

## 2012-04-25 ENCOUNTER — Other Ambulatory Visit: Payer: Self-pay | Admitting: Internal Medicine

## 2012-05-16 ENCOUNTER — Encounter: Payer: Self-pay | Admitting: Internal Medicine

## 2012-05-16 ENCOUNTER — Ambulatory Visit (INDEPENDENT_AMBULATORY_CARE_PROVIDER_SITE_OTHER): Payer: Medicare Other | Admitting: Internal Medicine

## 2012-05-16 VITALS — BP 120/78 | Temp 98.0°F | Wt 150.0 lb

## 2012-05-16 DIAGNOSIS — J449 Chronic obstructive pulmonary disease, unspecified: Secondary | ICD-10-CM

## 2012-05-16 DIAGNOSIS — J4489 Other specified chronic obstructive pulmonary disease: Secondary | ICD-10-CM

## 2012-05-16 DIAGNOSIS — K5732 Diverticulitis of large intestine without perforation or abscess without bleeding: Secondary | ICD-10-CM

## 2012-05-16 DIAGNOSIS — K5792 Diverticulitis of intestine, part unspecified, without perforation or abscess without bleeding: Secondary | ICD-10-CM

## 2012-05-16 MED ORDER — METRONIDAZOLE 500 MG PO TABS: 500.0000 mg | ORAL_TABLET | Freq: Three times a day (TID) | ORAL | Status: AC

## 2012-05-16 NOTE — Patient Instructions (Signed)
Diverticulitis Small pockets or "bubbles" can develop in the wall of the intestine. Diverticulitis is when those pockets become infected and inflamed. This causes stomach pain (usually on the left side). HOME CARE  Take all medicine as told by your doctor.   Try a clear liquid diet (broth, tea, or water) for as long as told by your doctor.   Keep all follow-up visits with your doctor.   You may be put on a low-fiber diet once you start feeling better. Here are foods that have low-fiber:   White breads, cereals, rice, and pasta.   Cooked fruits and vegetables or soft fresh fruits and vegetables without the skin.   Ground or well-cooked tender beef, ham, veal, lamb, pork, or poultry.   Eggs and seafood.   After you are doing well on the low-fiber diet, you may be put on a high-fiber diet. Here are ways to increase your fiber:   Choose whole-grain breads, cereals, pasta, and brown rice.   Choose fruits and vegetables with skin on. Do not overcook the vegetables.   Choose nuts, seeds, legumes, dried peas, beans, and lentils.   Look for food products that have more than 3 grams of fiber per serving on the food label.  GET HELP RIGHT AWAY IF:  Your pain does not get better or gets worse.   You have trouble eating food.   You are not pooping (having bowel movements) like normal.   You have a temperature by mouth above 102 F (38.9 C), not controlled by medicine.   You keep throwing up (vomiting).   You have bloody or black, tarry poop (stools).   You are getting worse and not better.  MAKE SURE YOU:   Understand these instructions.   Will watch your condition.   Will get help right away if you are not doing well or get worse.  Document Released: 05/03/2008 Document Revised: 11/04/2011 Document Reviewed: 10/06/2009 [employer] Patient Information 2012 [employer]. 

## 2012-05-16 NOTE — Progress Notes (Signed)
Subjective:    Patient ID: Holly [name], female    DOB: [birth year], 78 y.o.   MRN: [id number]  HPI  78 year old patient who has a history of colonic polyps and extensive diverticular disease. Her last colonoscopy was in 2009. She presents today with worsening left lower: Current and lower midline abdominal discomfort. No fever.   Review of Systems  Constitutional: Negative.   HENT: Negative for hearing loss, congestion, sore throat, rhinorrhea, dental problem, sinus pressure and tinnitus.   Eyes: Negative for pain, discharge and visual disturbance.  Respiratory: Negative for cough and shortness of breath.   Cardiovascular: Negative for chest pain, palpitations and leg swelling.  Gastrointestinal: Positive for abdominal pain and constipation. Negative for nausea, vomiting, diarrhea, blood in stool and abdominal distention.  Genitourinary: Negative for dysuria, urgency, frequency, hematuria, flank pain, vaginal bleeding, vaginal discharge, difficulty urinating, vaginal pain and pelvic pain.  Musculoskeletal: Negative for joint swelling, arthralgias and gait problem.  Skin: Negative for rash.  Neurological: Negative for dizziness, syncope, speech difficulty, weakness, numbness and headaches.  Hematological: Negative for adenopathy.  Psychiatric/Behavioral: Negative for behavioral problems, dysphoric mood and agitation. The patient is not nervous/anxious.        Objective:   Physical Exam  Constitutional: She is oriented to person, place, and time. She appears well-developed and well-nourished.  HENT:  Head: Normocephalic.  Right Ear: External ear normal.  Left Ear: External ear normal.  Mouth/Throat: Oropharynx is clear and moist.  Eyes: Conjunctivae and EOM are normal. Pupils are equal, round, and reactive to light.  Neck: Normal range of motion. Neck supple. No thyromegaly present.  Cardiovascular: Normal rate, regular rhythm, normal heart sounds and intact distal pulses.     Pulmonary/Chest: Effort normal and breath sounds normal.  Abdominal: Soft. Bowel sounds are normal. She exhibits no distension and no mass. There is tenderness. There is no rebound and no guarding.       Tenderness in left lower quadrant. No significant guarding or rebound. Bowel sounds were slightly diminished  Musculoskeletal: Normal range of motion.  Lymphadenopathy:    She has no cervical adenopathy.  Neurological: She is alert and oriented to person, place, and time.  Skin: Skin is warm and dry. No rash noted.  Psychiatric: She has a normal mood and affect. Her behavior is normal.          Assessment & Plan:   Probable mild diverticulitis. Patient has a intolerance to Levaquin although she does not recall the details. We'll treat with metronidazole and follow closely clinically COPD

## 2012-06-21 DIAGNOSIS — H353290 Exudative age-related macular degeneration, unspecified eye, stage unspecified: Secondary | ICD-10-CM | POA: Diagnosis not present

## 2012-06-29 ENCOUNTER — Telehealth: Payer: Self-pay | Admitting: Internal Medicine

## 2012-06-29 NOTE — Telephone Encounter (Signed)
Caller: Susie/Child; PCP: Eleonore Chiquito; CB#: [phone]; ; ; Call regarding Diverticulitis;  Susie/Daughter states Labella had onset of intermittent right sided abdominal pain @ 1000. States pain is deep and has trouble standing up. Has ED disposition due to localized pain made worse by standing upright. States Ivalee " had a bout with diverticulitis one month ago." Providencia refuses appt for 06/29/12. Holly in office notifed. Call transferred to [facility] and appt scheduled for 06/30/12.

## 2012-06-30 ENCOUNTER — Ambulatory Visit: Payer: Medicare Other | Admitting: Internal Medicine

## 2012-07-20 ENCOUNTER — Telehealth: Payer: Self-pay | Admitting: Internal Medicine

## 2012-07-20 NOTE — Telephone Encounter (Signed)
Caller: Nishtha/Patient; Patient Name: Holly [name]; PCP: Eleonore Chiquito; Best Callback Phone Number: [phone].  Call regarding Advair refill.  Per EPIC, Patient has 3 refills, consulted with [facility] [address] Pharmacist, [facility] [place], Pharmacist unable to access script to complete refill, states script is out-of-date, according to EPIC, expiration is 02-21-13. Patient denies breathing difficulty or wheezing.  PLEASE FOLLOW UP WITH [facility] [address] FOR REFILL.

## 2012-07-21 MED ORDER — FLUTICASONE-SALMETEROL 500-50 MCG/DOSE IN AEPB: 1.0000 | INHALATION_SPRAY | Freq: Two times a day (BID) | RESPIRATORY_TRACT | Status: DC

## 2012-07-21 NOTE — Telephone Encounter (Signed)
Rx sent to pharmacy   

## 2012-07-28 DIAGNOSIS — H3530 Unspecified macular degeneration: Secondary | ICD-10-CM | POA: Diagnosis not present

## 2012-07-28 DIAGNOSIS — H35059 Retinal neovascularization, unspecified, unspecified eye: Secondary | ICD-10-CM | POA: Diagnosis not present

## 2012-08-07 ENCOUNTER — Ambulatory Visit (INDEPENDENT_AMBULATORY_CARE_PROVIDER_SITE_OTHER): Payer: Medicare Other | Admitting: Internal Medicine

## 2012-08-07 ENCOUNTER — Encounter: Payer: Self-pay | Admitting: Internal Medicine

## 2012-08-07 VITALS — BP 112/70 | Temp 98.0°F | Wt 146.0 lb

## 2012-08-07 DIAGNOSIS — R300 Dysuria: Secondary | ICD-10-CM | POA: Diagnosis not present

## 2012-08-07 LAB — POCT URINALYSIS DIPSTICK
Bilirubin, UA: NEGATIVE
Glucose, UA: NEGATIVE
Ketones, UA: NEGATIVE
Nitrite, UA: NEGATIVE
Spec Grav, UA: 1.02
Urobilinogen, UA: 0.2
pH, UA: 6

## 2012-08-07 MED ORDER — CEFDINIR 300 MG PO CAPS: 300.0000 mg | ORAL_CAPSULE | Freq: Two times a day (BID) | ORAL | Status: AC

## 2012-08-07 NOTE — Progress Notes (Signed)
Subjective:    Patient ID: Holly [name], female    DOB: 08/29/1928, 78 y.o.   MRN: [id number]  HPI  78 year old patient who is seen today for followup. For the past 3 days she has had urinary frequency and dysuria. She is treated for a UTI approximately 5 months ago initially with Macrobid that was well-tolerated. She felt weak and nauseated. She does have allergy history also to Levaquin and sulfa antibiotics. Denies any fever or chills; Macrobid was discontinued and the patient did well on Omnicef.    Review of Systems  Constitutional: Negative.   HENT: Negative for hearing loss, congestion, sore throat, rhinorrhea, dental problem, sinus pressure and tinnitus.   Eyes: Negative for pain, discharge and visual disturbance.  Respiratory: Negative for cough and shortness of breath.   Cardiovascular: Negative for chest pain, palpitations and leg swelling.  Gastrointestinal: Negative for nausea, vomiting, abdominal pain, diarrhea, constipation, blood in stool and abdominal distention.  Genitourinary: Positive for dysuria, urgency and frequency. Negative for hematuria, flank pain, vaginal bleeding, vaginal discharge, difficulty urinating, vaginal pain and pelvic pain.  Musculoskeletal: Negative for joint swelling, arthralgias and gait problem.  Skin: Negative for rash.  Neurological: Negative for dizziness, syncope, speech difficulty, weakness, numbness and headaches.  Hematological: Negative for adenopathy.  Psychiatric/Behavioral: Negative for behavioral problems, dysphoric mood and agitation. The patient is not nervous/anxious.        Objective:   Physical Exam  Constitutional: She appears well-developed and well-nourished. No distress.       Afebrile. No distress          Assessment & Plan:   UTI. Will retreat with Omnicef for 7 days.

## 2012-08-07 NOTE — Patient Instructions (Signed)
Drink as much fluid as you  can tolerate over the next few days  Take your antibiotic as prescribed until ALL of it is gone, but stop if you develop a rash, swelling, or any side effects of the medication.  Contact our office as soon as possible if  there are side effects of the medication. Urinary Tract Infection, Child A urinary tract infection (UTI) is an infection of the kidneys or bladder. This infection is usually caused by bacteria. CAUSES    Ignoring the need to urinate or holding urine for long periods of time.   Not emptying the bladder completely during urination.   In girls, wiping from back to front after urination or bowel movements.   Using bubble bath, shampoos, or soaps in your child's bath water.   Constipation.   Abnormalities of the kidneys or bladder.  SYMPTOMS    Frequent urination.   Pain or burning sensation with urination.   Urine that smells unusual or is cloudy.   Lower abdominal or back pain.   Bed wetting.   Difficulty urinating.   Blood in the urine.   Fever.   Irritability.  DIAGNOSIS   A UTI is diagnosed with a urine culture. A urine culture detects bacteria and yeast in urine. A sample of urine will need to be collected for a urine culture. TREATMENT   A bladder infection (cystitis) or kidney infection (pyelonephritis) will usually respond to antibiotics. These are medications that kill germs. Your child should take all the medicine given until it is gone. Your child may feel better in a few days, but give ALL MEDICINE. Otherwise, the infection may not respond and become more difficult to treat. Response can generally be expected in 7 to 10 days. HOME CARE INSTRUCTIONS    Give your child lots of fluid to drink.   Avoid caffeine, tea, and carbonated beverages. They tend to irritate the bladder.   Do not use bubble bath, shampoos, or soaps in your child's bath water.   Only give your child over-the-counter or prescription medicines for  pain, discomfort, or fever as directed by your child's caregiver.   Do not give aspirin to children. It may cause Reye's syndrome.   It is important that you keep all follow-up appointments. Be sure to tell your caregiver if your child's symptoms continue or return. For repeated infections, your caregiver may need to evaluate your child's kidneys or bladder.  To prevent further infections:  Encourage your child to empty his or her bladder often and not to hold urine for long periods of time.   After a bowel movement, girls should cleanse from front to back. Use each tissue only once.  SEEK MEDICAL CARE IF:    Your child develops back pain.   Your child has an oral temperature above 102 F (38.9 C).   Your baby is older than 3 months with a rectal temperature of 100.5 F (38.1 C) or higher for more than 1 day.   Your child develops nausea or vomiting.   Your child's symptoms are no better after 3 days of antibiotics.  SEEK IMMEDIATE MEDICAL CARE IF:  Your child has an oral temperature above 102 F (38.9 C).   Your baby is older than 3 months with a rectal temperature of 102 F (38.9 C) or higher.   Your baby is 13 months old or younger with a rectal temperature of 100.4 F (38 C) or higher.  Document Released: 08/25/2005 Document Revised: 11/04/2011 Document Reviewed:  09/05/2009 ExitCare Patient Information 2012 [place], [place].

## 2012-08-11 ENCOUNTER — Other Ambulatory Visit: Payer: Self-pay

## 2012-08-11 MED ORDER — HYDROCOD POLST-CHLORPHEN POLST 10-8 MG/5ML PO LQCR: 5.0000 mL | Freq: Two times a day (BID) | ORAL | Status: DC | PRN

## 2012-08-11 NOTE — Telephone Encounter (Signed)
Fax refill request for hydrocodone- clorpheniram suspension Last seen 08/07/12 UTI Last written 02/22/12 60ml   0RF Please advise

## 2012-08-11 NOTE — Telephone Encounter (Signed)
0k

## 2012-08-11 NOTE — Telephone Encounter (Signed)
Called in.

## 2012-08-22 ENCOUNTER — Ambulatory Visit (INDEPENDENT_AMBULATORY_CARE_PROVIDER_SITE_OTHER): Payer: Medicare Other

## 2012-08-22 DIAGNOSIS — Z23 Encounter for immunization: Secondary | ICD-10-CM | POA: Diagnosis not present

## 2012-08-24 ENCOUNTER — Ambulatory Visit (INDEPENDENT_AMBULATORY_CARE_PROVIDER_SITE_OTHER): Payer: Medicare Other | Admitting: Internal Medicine

## 2012-08-24 ENCOUNTER — Encounter: Payer: Self-pay | Admitting: Internal Medicine

## 2012-08-24 VITALS — BP 122/70 | Temp 97.9°F | Wt 149.0 lb

## 2012-08-24 DIAGNOSIS — I2510 Atherosclerotic heart disease of native coronary artery without angina pectoris: Secondary | ICD-10-CM

## 2012-08-24 DIAGNOSIS — E785 Hyperlipidemia, unspecified: Secondary | ICD-10-CM

## 2012-08-24 DIAGNOSIS — J441 Chronic obstructive pulmonary disease with (acute) exacerbation: Secondary | ICD-10-CM | POA: Diagnosis not present

## 2012-08-24 DIAGNOSIS — E039 Hypothyroidism, unspecified: Secondary | ICD-10-CM | POA: Diagnosis not present

## 2012-08-24 MED ORDER — ATORVASTATIN CALCIUM 10 MG PO TABS: 10.0000 mg | ORAL_TABLET | Freq: Every day | ORAL | Status: DC

## 2012-08-24 MED ORDER — PANTOPRAZOLE SODIUM 40 MG PO TBEC: 40.0000 mg | DELAYED_RELEASE_TABLET | Freq: Every day | ORAL | Status: DC

## 2012-08-24 MED ORDER — FLUTICASONE-SALMETEROL 500-50 MCG/DOSE IN AEPB: 1.0000 | INHALATION_SPRAY | Freq: Two times a day (BID) | RESPIRATORY_TRACT | Status: DC

## 2012-08-24 MED ORDER — LEVOTHYROXINE SODIUM 112 MCG PO TABS: 112.0000 ug | ORAL_TABLET | Freq: Every day | ORAL | Status: DC

## 2012-08-24 NOTE — Patient Instructions (Signed)
Limit your sodium (Salt) intake    It is important that you exercise regularly, at least 20 minutes 3 to 4 times per week.  If you develop chest pain or shortness of breath seek  medical attention. 

## 2012-08-24 NOTE — Progress Notes (Signed)
Subjective:    Patient ID: Holly [name], female    DOB: 07/06/1928, 78 y.o.   MRN: [id number]  HPI  78 year old patient who is seen today for followup. She has a history of COPD which has been stable. She did receive a flu vaccine recently. She has dyslipidemia and remains on atorvastatin. No real concerns or complaints today she has very little albuterol use but does take this occasionally do to dyspnea on exertion. She has treated hypothyroidism. Her last physical was in March of this year.  Past Medical History  Diagnosis Date  . CELLULITIS 06/18/2008  . CHRONIC OBSTRUCTIVE PULMONARY DISEASE, ACUTE EXACERBATION 11/16/2007  . COPD 05/18/2007  . GERD 12/25/2007  . HYPERLIPIDEMIA 05/18/2007  . HYPOTHYROIDISM 05/18/2007  . Urosepsis   . Macular degeneration   . CHF (congestive heart failure)   . CAD (coronary artery disease)     History   Social History  . Marital Status: Married    Spouse Name: N/A    Number of Children: N/A  . Years of Education: N/A   Occupational History  . Not on file.   Social History Main Topics  . Smoking status: Former Smoker    Quit date: 11/29/1985  . Smokeless tobacco: Never Used  . Alcohol Use: No  . Drug Use: No  . Sexually Active: Not on file   Other Topics Concern  . Not on file   Social History Narrative  . No narrative on file    Past Surgical History  Procedure Date  . Cataract extraction   . Varicose vein surgery   . Dilation and curettage of uterus     No family history on file.  Allergies  Allergen Reactions  . Levofloxacin     REACTION: unspecified  . Macrobid (Nitrofurantoin Macrocrystal) Nausea Only  . Sulfamethoxazole     REACTION: unspecified    Current Outpatient Prescriptions on File Prior to Visit  Medication Sig Dispense Refill  . albuterol (PROVENTIL HFA) 108 (90 BASE) MCG/ACT inhaler Inhale 2 puffs into the lungs every 6 (six) hours as needed.  3 Inhaler  6  . aspirin 81 MG tablet Take 81 mg by mouth daily.         Marland Kitchen atorvastatin (LIPITOR) 10 MG tablet TAKE 1 TABLET BY MOUTH EVERY DAY  90 tablet  3  . Fluticasone-Salmeterol (ADVAIR DISKUS) 500-50 MCG/DOSE AEPB Inhale 1 puff into the lungs every 12 (twelve) hours.  180 each  1  . levothyroxine (SYNTHROID, LEVOTHROID) 112 MCG tablet TAKE 1 TABLET BY MOUTH EVERY DAY  90 tablet  3  . pantoprazole (PROTONIX) 40 MG tablet Take 1 tablet (40 mg total) by mouth daily.  90 tablet  6  . chlorpheniramine-HYDROcodone (TUSSIONEX PENNKINETIC ER) 10-8 MG/5ML LQCR Take 5 mLs by mouth every 12 (twelve) hours as needed.  60 mL  0    BP 122/70  Temp 97.9 F (36.6 C) (Oral)  Wt 149 lb (67.586 kg)       Review of Systems  Constitutional: Negative.   HENT: Negative for hearing loss, congestion, sore throat, rhinorrhea, dental problem, sinus pressure and tinnitus.   Eyes: Negative for pain, discharge and visual disturbance.  Respiratory: Positive for shortness of breath. Negative for cough.   Cardiovascular: Negative for chest pain, palpitations and leg swelling.  Gastrointestinal: Negative for nausea, vomiting, abdominal pain, diarrhea, constipation, blood in stool and abdominal distention.  Genitourinary: Negative for dysuria, urgency, frequency, hematuria, flank pain, vaginal bleeding, vaginal discharge, difficulty urinating, vaginal  pain and pelvic pain.  Musculoskeletal: Negative for joint swelling, arthralgias and gait problem.  Skin: Negative for rash.  Neurological: Negative for dizziness, syncope, speech difficulty, weakness, numbness and headaches.  Hematological: Negative for adenopathy.  Psychiatric/Behavioral: Negative for behavioral problems, dysphoric mood and agitation. The patient is not nervous/anxious.        Objective:   Physical Exam  Constitutional: She is oriented to person, place, and time. She appears well-developed and well-nourished.  HENT:  Head: Normocephalic.  Right Ear: External ear normal.  Left Ear: External ear normal.    Mouth/Throat: Oropharynx is clear and moist.  Eyes: Conjunctivae normal and EOM are normal. Pupils are equal, round, and reactive to light.  Neck: Normal range of motion. Neck supple. No thyromegaly present.  Cardiovascular: Normal rate, regular rhythm, normal heart sounds and intact distal pulses.   Pulmonary/Chest: Effort normal and breath sounds normal.  Abdominal: Soft. Bowel sounds are normal. She exhibits no mass. There is no tenderness.  Musculoskeletal: Normal range of motion.  Lymphadenopathy:    She has no cervical adenopathy.  Neurological: She is alert and oriented to person, place, and time.  Skin: Skin is warm and dry. No rash noted.  Psychiatric: She has a normal mood and affect. Her behavior is normal.          Assessment & Plan:   COPD stable Dyslipidemia. Continue atorvastatin Hypothyroidism. Stable on present Synthroid dose will check a TSH in 6 months gastroesophageal reflux disease. Remains asymptomatic   CPX 6 months Medications refilled

## 2012-10-30 ENCOUNTER — Ambulatory Visit (INDEPENDENT_AMBULATORY_CARE_PROVIDER_SITE_OTHER): Payer: Medicare Other | Admitting: Internal Medicine

## 2012-10-30 ENCOUNTER — Encounter: Payer: Self-pay | Admitting: Internal Medicine

## 2012-10-30 ENCOUNTER — Telehealth: Payer: Self-pay | Admitting: Internal Medicine

## 2012-10-30 VITALS — BP 120/68 | HR 88 | Temp 99.1°F | Resp 20 | Wt 148.0 lb

## 2012-10-30 DIAGNOSIS — J441 Chronic obstructive pulmonary disease with (acute) exacerbation: Secondary | ICD-10-CM

## 2012-10-30 DIAGNOSIS — J449 Chronic obstructive pulmonary disease, unspecified: Secondary | ICD-10-CM | POA: Diagnosis not present

## 2012-10-30 MED ORDER — AZITHROMYCIN 250 MG PO TABS: ORAL_TABLET | ORAL | Status: AC

## 2012-10-30 MED ORDER — HYDROCOD POLST-CHLORPHEN POLST 10-8 MG/5ML PO LQCR: 5.0000 mL | Freq: Two times a day (BID) | ORAL | Status: DC | PRN

## 2012-10-30 MED ORDER — ATORVASTATIN CALCIUM 10 MG PO TABS: 10.0000 mg | ORAL_TABLET | Freq: Every day | ORAL | Status: DC

## 2012-10-30 MED ORDER — FLUTICASONE-SALMETEROL 500-50 MCG/DOSE IN AEPB: 1.0000 | INHALATION_SPRAY | Freq: Two times a day (BID) | RESPIRATORY_TRACT | Status: DC

## 2012-10-30 MED ORDER — LEVOTHYROXINE SODIUM 112 MCG PO TABS: 112.0000 ug | ORAL_TABLET | Freq: Every day | ORAL | Status: DC

## 2012-10-30 MED ORDER — ALBUTEROL SULFATE HFA 108 (90 BASE) MCG/ACT IN AERS: 2.0000 | INHALATION_SPRAY | Freq: Four times a day (QID) | RESPIRATORY_TRACT | Status: DC | PRN

## 2012-10-30 MED ORDER — PANTOPRAZOLE SODIUM 40 MG PO TBEC: 40.0000 mg | DELAYED_RELEASE_TABLET | Freq: Every day | ORAL | Status: DC

## 2012-10-30 NOTE — Telephone Encounter (Signed)
Pharmacist called and said that pt said that they were supposed to have an abx called in today. Didn't rcvd one.

## 2012-10-30 NOTE — Progress Notes (Signed)
Subjective:    Patient ID: Holly [name], female    DOB: 02-13-1928, 78 y.o.   MRN: [id number]  HPI  78 year old patient who has a history of COPD on maintenance inhalational medications. She presents with a four-day history of increasing cough congestion yielding thick green sputum. She has been slightly more short of breath and has had an intermittent low-grade fever. She feels unwell. Denies any wheezing or significant shortness of breath. Her husband also has an acute illness  Past Medical History  Diagnosis Date  . CELLULITIS 06/18/2008  . CHRONIC OBSTRUCTIVE PULMONARY DISEASE, ACUTE EXACERBATION 11/16/2007  . COPD 05/18/2007  . GERD 12/25/2007  . HYPERLIPIDEMIA 05/18/2007  . HYPOTHYROIDISM 05/18/2007  . Urosepsis   . Macular degeneration   . CHF (congestive heart failure)   . CAD (coronary artery disease)     History   Social History  . Marital Status: Married    Spouse Name: N/A    Number of Children: N/A  . Years of Education: N/A   Occupational History  . Not on file.   Social History Main Topics  . Smoking status: Former Smoker    Quit date: 11/29/1985  . Smokeless tobacco: Never Used  . Alcohol Use: No  . Drug Use: No  . Sexually Active: Not on file   Other Topics Concern  . Not on file   Social History Narrative  . No narrative on file    Past Surgical History  Procedure Date  . Cataract extraction   . Varicose vein surgery   . Dilation and curettage of uterus     No family history on file.  Allergies  Allergen Reactions  . Levofloxacin     REACTION: unspecified  . Macrobid (Nitrofurantoin Macrocrystal) Nausea Only  . Sulfamethoxazole     REACTION: unspecified    Current Outpatient Prescriptions on File Prior to Visit  Medication Sig Dispense Refill  . albuterol (PROVENTIL HFA) 108 (90 BASE) MCG/ACT inhaler Inhale 2 puffs into the lungs every 6 (six) hours as needed.  3 Inhaler  6  . aspirin 81 MG tablet Take 81 mg by mouth daily.        Marland Kitchen  atorvastatin (LIPITOR) 10 MG tablet Take 1 tablet (10 mg total) by mouth daily.  90 tablet  3  . chlorpheniramine-HYDROcodone (TUSSIONEX PENNKINETIC ER) 10-8 MG/5ML LQCR Take 5 mLs by mouth every 12 (twelve) hours as needed.  60 mL  0  . Fluticasone-Salmeterol (ADVAIR DISKUS) 500-50 MCG/DOSE AEPB Inhale 1 puff into the lungs every 12 (twelve) hours.  180 each  1  . levothyroxine (SYNTHROID, LEVOTHROID) 112 MCG tablet Take 1 tablet (112 mcg total) by mouth daily.  90 tablet  3  . pantoprazole (PROTONIX) 40 MG tablet Take 1 tablet (40 mg total) by mouth daily.  90 tablet  6    BP 120/68  Pulse 88  Temp 99.1 F (37.3 C) (Oral)  Resp 20  Wt 148 lb (67.132 kg)  SpO2 92%       Review of Systems  Constitutional: Positive for fatigue. Negative for fever and chills.  HENT: Negative for hearing loss, congestion, sore throat, rhinorrhea, dental problem, sinus pressure and tinnitus.   Eyes: Negative for pain, discharge and visual disturbance.  Respiratory: Positive for cough. Negative for shortness of breath.   Cardiovascular: Negative for chest pain, palpitations and leg swelling.  Gastrointestinal: Negative for nausea, vomiting, abdominal pain, diarrhea, constipation, blood in stool and abdominal distention.  Genitourinary: Negative for dysuria, urgency,  frequency, hematuria, flank pain, vaginal bleeding, vaginal discharge, difficulty urinating, vaginal pain and pelvic pain.  Musculoskeletal: Negative for joint swelling, arthralgias and gait problem.  Skin: Negative for rash.  Neurological: Negative for dizziness, syncope, speech difficulty, weakness, numbness and headaches.  Hematological: Negative for adenopathy.  Psychiatric/Behavioral: Negative for behavioral problems, dysphoric mood and agitation. The patient is not nervous/anxious.        Objective:   Physical Exam  Constitutional: She is oriented to person, place, and time. She appears well-developed and well-nourished.  HENT:    Head: Normocephalic.  Right Ear: External ear normal.  Left Ear: External ear normal.  Mouth/Throat: Oropharynx is clear and moist.  Eyes: Conjunctivae normal and EOM are normal. Pupils are equal, round, and reactive to light.  Neck: Normal range of motion. Neck supple. No thyromegaly present.  Cardiovascular: Normal rate, regular rhythm, normal heart sounds and intact distal pulses.   Pulmonary/Chest: Effort normal.       Few scattered rhonchi at the right base O2 saturation 93% Pulse rate 85  Abdominal: Soft. Bowel sounds are normal. She exhibits no mass. There is no tenderness.  Musculoskeletal: Normal range of motion.  Lymphadenopathy:    She has no cervical adenopathy.  Neurological: She is alert and oriented to person, place, and time.  Skin: Skin is warm and dry. No rash noted.  Psychiatric: She has a normal mood and affect. Her behavior is normal.          Assessment & Plan:   Acute exacerbation of COPD. Will treat with azithromycin expectorants. We'll call the office immediately if she develops any wheezing or shortness of breath

## 2012-10-30 NOTE — Patient Instructions (Signed)
Take your antibiotic as prescribed until ALL of it is gone, but stop if you develop a rash, swelling, or any side effects of the medication.  Contact our office as soon as possible if  there are side effects of the medication.  Call or return to clinic prn if these symptoms worsen or fail to improve as anticipated.  

## 2012-11-28 DIAGNOSIS — H3530 Unspecified macular degeneration: Secondary | ICD-10-CM | POA: Diagnosis not present

## 2012-11-28 DIAGNOSIS — H35059 Retinal neovascularization, unspecified, unspecified eye: Secondary | ICD-10-CM | POA: Diagnosis not present

## 2013-02-22 ENCOUNTER — Encounter: Payer: Self-pay | Admitting: Internal Medicine

## 2013-02-22 ENCOUNTER — Ambulatory Visit (INDEPENDENT_AMBULATORY_CARE_PROVIDER_SITE_OTHER): Payer: Medicare Other | Admitting: Internal Medicine

## 2013-02-22 VITALS — BP 140/80 | HR 60 | Temp 98.3°F | Resp 18 | Ht 62.0 in | Wt 151.0 lb

## 2013-02-22 DIAGNOSIS — E039 Hypothyroidism, unspecified: Secondary | ICD-10-CM

## 2013-02-22 DIAGNOSIS — I2510 Atherosclerotic heart disease of native coronary artery without angina pectoris: Secondary | ICD-10-CM | POA: Diagnosis not present

## 2013-02-22 DIAGNOSIS — E785 Hyperlipidemia, unspecified: Secondary | ICD-10-CM | POA: Diagnosis not present

## 2013-02-22 DIAGNOSIS — J449 Chronic obstructive pulmonary disease, unspecified: Secondary | ICD-10-CM

## 2013-02-22 DIAGNOSIS — J441 Chronic obstructive pulmonary disease with (acute) exacerbation: Secondary | ICD-10-CM | POA: Diagnosis not present

## 2013-02-22 DIAGNOSIS — Z0000 Encounter for general adult medical examination without abnormal findings: Secondary | ICD-10-CM

## 2013-02-22 DIAGNOSIS — K219 Gastro-esophageal reflux disease without esophagitis: Secondary | ICD-10-CM | POA: Diagnosis not present

## 2013-02-22 LAB — CBC WITH DIFFERENTIAL/PLATELET
Basophils Absolute: 0 10*3/uL (ref 0.0–0.1)
Basophils Relative: 0.7 % (ref 0.0–3.0)
Eosinophils Absolute: 0.6 10*3/uL (ref 0.0–0.7)
Eosinophils Relative: 8.9 % — ABNORMAL HIGH (ref 0.0–5.0)
HCT: 40.2 % (ref 36.0–46.0)
Hemoglobin: 13.4 g/dL (ref 12.0–15.0)
Lymphocytes Relative: 27.1 % (ref 12.0–46.0)
Lymphs Abs: 1.9 10*3/uL (ref 0.7–4.0)
MCHC: 33.2 g/dL (ref 30.0–36.0)
MCV: 90.9 fl (ref 78.0–100.0)
Monocytes Absolute: 0.6 10*3/uL (ref 0.1–1.0)
Monocytes Relative: 8.2 % (ref 3.0–12.0)
Neutro Abs: 3.9 10*3/uL (ref 1.4–7.7)
Neutrophils Relative %: 55.1 % (ref 43.0–77.0)
Platelets: 282 10*3/uL (ref 150.0–400.0)
RBC: 4.42 Mil/uL (ref 3.87–5.11)
RDW: 15.6 % — ABNORMAL HIGH (ref 11.5–14.6)
WBC: 7.1 10*3/uL (ref 4.5–10.5)

## 2013-02-22 LAB — COMPREHENSIVE METABOLIC PANEL
ALT: 14 U/L (ref 0–35)
AST: 22 U/L (ref 0–37)
Albumin: 3.7 g/dL (ref 3.5–5.2)
Alkaline Phosphatase: 102 U/L (ref 39–117)
BUN: 21 mg/dL (ref 6–23)
CO2: 28 mEq/L (ref 19–32)
Calcium: 9.2 mg/dL (ref 8.4–10.5)
Chloride: 102 mEq/L (ref 96–112)
Creatinine, Ser: 0.9 mg/dL (ref 0.4–1.2)
GFR: 67.87 mL/min (ref >60.00)
Glucose, Bld: 94 mg/dL (ref 70–99)
Potassium: 5.1 mEq/L (ref 3.5–5.1)
Sodium: 137 mEq/L (ref 135–145)
Total Bilirubin: 0.9 mg/dL (ref 0.3–1.2)
Total Protein: 7.2 g/dL (ref 6.0–8.3)

## 2013-02-22 LAB — TSH: TSH: 0.51 u[IU]/mL (ref 0.35–5.50)

## 2013-02-22 MED ORDER — FLUTICASONE-SALMETEROL 500-50 MCG/DOSE IN AEPB: 1.0000 | INHALATION_SPRAY | Freq: Two times a day (BID) | RESPIRATORY_TRACT | Status: DC

## 2013-02-22 MED ORDER — PANTOPRAZOLE SODIUM 40 MG PO TBEC: 40.0000 mg | DELAYED_RELEASE_TABLET | Freq: Every day | ORAL | Status: DC

## 2013-02-22 MED ORDER — ATORVASTATIN CALCIUM 10 MG PO TABS: 10.0000 mg | ORAL_TABLET | Freq: Every day | ORAL | Status: DC

## 2013-02-22 MED ORDER — LEVOTHYROXINE SODIUM 112 MCG PO TABS: 112.0000 ug | ORAL_TABLET | Freq: Every day | ORAL | Status: DC

## 2013-02-22 MED ORDER — ALBUTEROL SULFATE HFA 108 (90 BASE) MCG/ACT IN AERS: 2.0000 | INHALATION_SPRAY | Freq: Four times a day (QID) | RESPIRATORY_TRACT | Status: DC | PRN

## 2013-02-22 NOTE — Progress Notes (Signed)
Patient ID: Holly [name], female   DOB: [birth year], 79 y.o.   MRN: [id number]  Subjective:    Patient ID: Holly [name], female    DOB: [birth year], 79 y.o.   MRN: [id number]  HPI 79 year-old patient who is in today for a preventive health examination. Medical problems include coronary artery disease as well as COPD. She has been quite stable. She has treated hypothyroidism and also a history of dyslipidemia.  She remains on maintenance Advair he continues to do well from a pulmonary standpoint. No significant albuterol use.  1. Risk factors, based on past  M,S,F history- patient has known coronary artery disease cardiovascular risk factors include dyslipidemia and remote tobacco use  2.  Physical activities: Pulmonary status is stable no significant exercise limitations  3.  Depression/mood: No history of depression or mood disorder  4.  Hearing: No significant deficits  5.  ADL's: Independent in all aspects of daily living  6.  Fall risk: Low 7.  Home safety: No problems identified  8.  Height weight, and visual acuity; height and weight stable. Has a history of macular degeneration and is followed by ophthalmology  9.  Counseling: Regular exercise low salt diet all encouraged  10. Lab orders based on risk factors: Laboratory profile including lipid panel and TSH will be reviewed  11. Referral : Followup ophthalmology  12. Care plan: Followup ophthalmology recheck here in 6 months for flu vaccine will be administered at that time  13. Cognitive assessment: Alert and appropriate with normal affect. No cognitive dysfunction.     Allergies:  1) Levaquin (Levofloxacin)  2) Sulfamethoxazole (Sulfamethoxazole)   Past History:  Past Medical History:  COPD  Hyperlipidemia  Hypothyroidism  Urosepsis 2004  macular degeneration  Coronary artery disease   Past Surgical History:  Reviewed history from 08/13/2009 and no changes required.  D&C  Vein stripping  G4 P4 A0   sigmoidoscopy 2001  s/p cataract OD  colonoscopy 2009   Family History:  Reviewed history from 09/18/2007 and no changes required.  father died age 58  mother died 85 gynecologic cancer  4 brothers two sisters  positive for colon cancer, coronary artery disease, scleroderma   Social History:  Reviewed history from 05/16/2009 and no changes required.  Married  d/c tobacco 1987    Review of Systems  Constitutional: Negative.   HENT: Negative for hearing loss, congestion, sore throat, rhinorrhea, dental problem, sinus pressure and tinnitus.   Eyes: Negative for pain, discharge and visual disturbance.  Respiratory: Positive for cough. Negative for shortness of breath.   Cardiovascular: Negative for chest pain, palpitations and leg swelling.  Gastrointestinal: Negative for nausea, vomiting, abdominal pain, diarrhea, constipation, blood in stool and abdominal distention.  Genitourinary: Negative for dysuria, urgency, frequency, hematuria, flank pain, vaginal bleeding, vaginal discharge, difficulty urinating, vaginal pain and pelvic pain.  Musculoskeletal: Negative for joint swelling, arthralgias and gait problem.  Skin: Negative for rash.  Neurological: Negative for dizziness, syncope, speech difficulty, weakness, numbness and headaches.  Hematological: Negative for adenopathy.  Psychiatric/Behavioral: Negative for behavioral problems, dysphoric mood and agitation. The patient is not nervous/anxious.        Objective:   Physical Exam  Constitutional: She is oriented to person, place, and time. She appears well-developed and well-nourished.  HENT:  Head: Normocephalic and atraumatic.  Right Ear: External ear normal.  Left Ear: External ear normal.  Mouth/Throat: Oropharynx is clear and moist.  Eyes: Conjunctivae and EOM are normal.  Neck: Normal range  of motion. Neck supple. No JVD present. No thyromegaly present.  Cardiovascular: Normal rate, regular rhythm, normal heart sounds  and intact distal pulses.   No murmur heard. Dorsalis pedis pulses full. Posterior tibial pulses not easily palpable  Pulmonary/Chest: Effort normal and breath sounds normal. She has no wheezes. She has no rales.  Kyphosis  O2 saturation 96%  Abdominal: Soft. Bowel sounds are normal. She exhibits no distension and no mass. There is no tenderness. There is no rebound and no guarding.  Musculoskeletal: Normal range of motion. She exhibits no edema and no tenderness.  Neurological: She is alert and oriented to person, place, and time. She has normal reflexes. No cranial nerve deficit. She exhibits normal muscle tone. Coordination normal.  Skin: Skin is warm and dry. No rash noted.  Psychiatric: She has a normal mood and affect. Her behavior is normal.          Assessment & Plan:   Preventive health examination Hypothyroidism. TSH will be reviewed Dyslipidemia lipid profile reviewed COPD stable we'll continue present inhalational medications. Recheck in 6 months for a flu vaccine

## 2013-02-22 NOTE — Patient Instructions (Signed)
Limit your sodium (Salt) intake    It is important that you exercise regularly, at least 20 minutes 3 to 4 times per week.  If you develop chest pain or shortness of breath seek  medical attention. 

## 2013-03-22 ENCOUNTER — Ambulatory Visit (INDEPENDENT_AMBULATORY_CARE_PROVIDER_SITE_OTHER): Payer: Medicare Other | Admitting: Family Medicine

## 2013-03-22 ENCOUNTER — Ambulatory Visit: Payer: Medicare Other | Admitting: Internal Medicine

## 2013-03-22 ENCOUNTER — Encounter: Payer: Self-pay | Admitting: Family Medicine

## 2013-03-22 VITALS — BP 130/80 | HR 96 | Temp 99.0°F | Wt 149.0 lb

## 2013-03-22 DIAGNOSIS — J209 Acute bronchitis, unspecified: Secondary | ICD-10-CM | POA: Diagnosis not present

## 2013-03-22 DIAGNOSIS — J069 Acute upper respiratory infection, unspecified: Secondary | ICD-10-CM | POA: Diagnosis not present

## 2013-03-22 DIAGNOSIS — J441 Chronic obstructive pulmonary disease with (acute) exacerbation: Secondary | ICD-10-CM

## 2013-03-22 MED ORDER — HYDROCOD POLST-CHLORPHEN POLST 10-8 MG/5ML PO LQCR: 5.0000 mL | Freq: Two times a day (BID) | ORAL | Status: DC | PRN

## 2013-03-22 MED ORDER — GUAIFENESIN-CODEINE 100-10 MG/5ML PO SYRP: 5.0000 mL | ORAL_SOLUTION | Freq: Three times a day (TID) | ORAL | Status: DC | PRN

## 2013-03-22 MED ORDER — AZITHROMYCIN 250 MG PO TABS: ORAL_TABLET | ORAL | Status: DC

## 2013-03-22 NOTE — Addendum Note (Signed)
Addended by: Terressa Koyanagi on: 03/22/2013 01:29 PM   Modules accepted: Orders

## 2013-03-22 NOTE — Patient Instructions (Addendum)
-  As we discussed, we have prescribed new medications (azithromycin and a cough medicine) for you at this appointment. We discussed the common and serious potential adverse effects of this medication and you can review these and more with the pharmacist when you pick up your medication.  Please follow the instructions for use carefully and notify us immediately if you have any problems taking this medication.  -follow up if worsening or not improving

## 2013-03-22 NOTE — Progress Notes (Signed)
Chief Complaint  Patient presents with  . Cough    congestion, headache, facial pain, facial pain and pressure     HPI:  Acute visit for cough: -started 4-5 days -symptoms: nasal congestion, sinus pressure, HA, sore throat, scratchy throat, cough - thinks she has sinus issues - coughing up a lot of green mucus -has COPD and takes her advair daily, did use her albuterol recently , but reports breathing is ok -denies: fevers, chills, SOB, NVD, tooth pain or ear pain -sick contacts: none known -tried: nothing -reports gets this once a year and PCP always give her zpack and tussinex   ROS: See pertinent positives and negatives per HPI.  Past Medical History  Diagnosis Date  . CELLULITIS 06/18/2008  . CHRONIC OBSTRUCTIVE PULMONARY DISEASE, ACUTE EXACERBATION 11/16/2007  . COPD 05/18/2007  . GERD 12/25/2007  . HYPERLIPIDEMIA 05/18/2007  . HYPOTHYROIDISM 05/18/2007  . Urosepsis   . Macular degeneration   . CHF (congestive heart failure)   . CAD (coronary artery disease)     No family history on file.  History   Social History  . Marital Status: Married    Spouse Name: N/A    Number of Children: N/A  . Years of Education: N/A   Social History Main Topics  . Smoking status: Former Smoker    Quit date: 11/29/1985  . Smokeless tobacco: Never Used  . Alcohol Use: No  . Drug Use: No  . Sexually Active: None   Other Topics Concern  . None   Social History Narrative  . None    Current outpatient prescriptions:albuterol (PROVENTIL HFA) 108 (90 BASE) MCG/ACT inhaler, Inhale 2 puffs into the lungs every 6 (six) hours as needed., Disp: 3 Inhaler, Rfl: 6;  aspirin 81 MG tablet, Take 81 mg by mouth daily.  , Disp: , Rfl: ;  atorvastatin (LIPITOR) 10 MG tablet, Take 1 tablet (10 mg total) by mouth daily., Disp: 90 tablet, Rfl: 3 Fluticasone-Salmeterol (ADVAIR DISKUS) 500-50 MCG/DOSE AEPB, Inhale 1 puff into the lungs every 12 (twelve) hours., Disp: 180 each, Rfl: 1;  levothyroxine  (SYNTHROID, LEVOTHROID) 112 MCG tablet, Take 1 tablet (112 mcg total) by mouth daily., Disp: 90 tablet, Rfl: 3;  pantoprazole (PROTONIX) 40 MG tablet, Take 1 tablet (40 mg total) by mouth daily., Disp: 90 tablet, Rfl: 6 azithromycin (ZITHROMAX) 250 MG tablet, 2 tabs on first day then 1 tab daily, Disp: 6 tablet, Rfl: 0;  guaiFENesin-codeine (ROBITUSSIN AC) 100-10 MG/5ML syrup, Take 5 mLs by mouth 3 (three) times daily as needed for cough., Disp: 120 mL, Rfl: 0  EXAM:  Filed Vitals:   03/22/13 1303  BP: 130/80  Pulse: 96  Temp: 99 F (37.2 C)    Body mass index is 27.25 kg/(m^2).  GENERAL: vitals reviewed and listed above, alert, oriented, appears well hydrated and in no acute distress  HEENT: atraumatic, conjunttiva clear, no obvious abnormalities on inspection of external nose and ears, normal appearance of ear canals and TMs, clear nasal congestion, mild post oropharyngeal erythema with PND, no tonsillar edema or exudate, no sinus TTP  NECK: no obvious masses on inspection  LUNGS: clear to auscultation bilaterally, no wheezes, rales or rhonchi, good air movement  CV: HRRR, no peripheral edema  MS: moves all extremities without noticeable abnormality  PSYCH: pleasant and cooperative, no obvious depression or anxiety  ASSESSMENT AND PLAN:  Discussed the following assessment and plan:  Acute bronchitis - Plan: azithromycin (ZITHROMAX) 250 MG tablet, guaiFENesin-codeine (ROBITUSSIN AC) 100-10 MG/5ML syrup  Upper respiratory infection  CHRONIC OBSTRUCTIVE PULMONARY DISEASE, ACUTE EXACERBATION - Plan: azithromycin (ZITHROMAX) 250 MG tablet  -Patient advised to return or notify a doctor immediately if symptoms worsen or persist or new concerns arise.  Patient Instructions  -As we discussed, we have prescribed new medications (azithromycin and a cough medicine) for you at this appointment. We discussed the common and serious potential adverse effects of this medication and you can  review these and more with the pharmacist when you pick up your medication.  Please follow the instructions for use carefully and notify us immediately if you have any problems taking this medication.  -follow up if worsening or not improving      Holly [name] R.

## 2013-04-04 ENCOUNTER — Encounter: Payer: Self-pay | Admitting: Internal Medicine

## 2013-04-04 ENCOUNTER — Ambulatory Visit (INDEPENDENT_AMBULATORY_CARE_PROVIDER_SITE_OTHER): Payer: Medicare Other | Admitting: Internal Medicine

## 2013-04-04 VITALS — BP 126/80 | HR 73 | Temp 98.8°F | Resp 20 | Wt 150.0 lb

## 2013-04-04 DIAGNOSIS — J449 Chronic obstructive pulmonary disease, unspecified: Secondary | ICD-10-CM | POA: Diagnosis not present

## 2013-04-04 DIAGNOSIS — E039 Hypothyroidism, unspecified: Secondary | ICD-10-CM

## 2013-04-04 DIAGNOSIS — J4489 Other specified chronic obstructive pulmonary disease: Secondary | ICD-10-CM

## 2013-04-04 MED ORDER — FIRST-DUKES MOUTHWASH MT SUSP: 5.0000 mL | Freq: Four times a day (QID) | OROMUCOSAL | Status: DC

## 2013-04-04 NOTE — Patient Instructions (Addendum)
Call or return to clinic prn if these symptoms worsen or fail to improve as anticipated.  Carefully rinse your mouth completely following  use of Advair inhalation

## 2013-04-04 NOTE — Progress Notes (Signed)
Subjective:    Patient ID: Holly [name], female    DOB: 1928-10-07, 79 y.o.   MRN: [id number]  HPI  79 year old patient who has COPD. Medical treatment includes Advair. She was seen 2 weeks ago with an exacerbation of COPD and was treated with azithromycin. She presents with a three-day history of sore throat. There has been minimal sinus congestion and postnasal drip. She has been somewhat concerned about possible thrush. She has not had this complication prior. No fever. No shortness or breath or sputum production.  Past Medical History  Diagnosis Date  . CELLULITIS 06/18/2008  . CHRONIC OBSTRUCTIVE PULMONARY DISEASE, ACUTE EXACERBATION 11/16/2007  . COPD 05/18/2007  . GERD 12/25/2007  . HYPERLIPIDEMIA 05/18/2007  . HYPOTHYROIDISM 05/18/2007  . Urosepsis   . Macular degeneration   . CHF (congestive heart failure)   . CAD (coronary artery disease)     History   Social History  . Marital Status: Married    Spouse Name: N/A    Number of Children: N/A  . Years of Education: N/A   Occupational History  . Not on file.   Social History Main Topics  . Smoking status: Former Smoker    Quit date: 11/29/1985  . Smokeless tobacco: Never Used  . Alcohol Use: No  . Drug Use: No  . Sexually Active: Not on file   Other Topics Concern  . Not on file   Social History Narrative  . No narrative on file    Past Surgical History  Procedure Laterality Date  . Cataract extraction    . Varicose vein surgery    . Dilation and curettage of uterus      No family history on file.  Allergies  Allergen Reactions  . Levofloxacin     REACTION: unspecified  . Macrobid (Nitrofurantoin Macrocrystal) Nausea Only  . Sulfamethoxazole     REACTION: unspecified    Current Outpatient Prescriptions on File Prior to Visit  Medication Sig Dispense Refill  . albuterol (PROVENTIL HFA) 108 (90 BASE) MCG/ACT inhaler Inhale 2 puffs into the lungs every 6 (six) hours as needed.  3 Inhaler  6  . aspirin  81 MG tablet Take 81 mg by mouth daily.        Marland Kitchen atorvastatin (LIPITOR) 10 MG tablet Take 1 tablet (10 mg total) by mouth daily.  90 tablet  3  . chlorpheniramine-HYDROcodone (TUSSIONEX PENNKINETIC ER) 10-8 MG/5ML LQCR Take 5 mLs by mouth every 12 (twelve) hours as needed.  115 mL  0  . Fluticasone-Salmeterol (ADVAIR DISKUS) 500-50 MCG/DOSE AEPB Inhale 1 puff into the lungs every 12 (twelve) hours.  180 each  1  . levothyroxine (SYNTHROID, LEVOTHROID) 112 MCG tablet Take 1 tablet (112 mcg total) by mouth daily.  90 tablet  3  . pantoprazole (PROTONIX) 40 MG tablet Take 1 tablet (40 mg total) by mouth daily.  90 tablet  6   No current facility-administered medications on file prior to visit.    BP 126/80  Pulse 73  Temp(Src) 98.8 F (37.1 C) (Oral)  Resp 20  Wt 150 lb (68.04 kg)  BMI 27.43 kg/m2  SpO2 96%       Review of Systems  Constitutional: Negative.   HENT: Positive for congestion, sore throat and rhinorrhea. Negative for hearing loss, dental problem, sinus pressure and tinnitus.   Eyes: Negative for pain, discharge and visual disturbance.  Respiratory: Negative for cough and shortness of breath.   Cardiovascular: Negative for chest pain, palpitations and leg  swelling.  Gastrointestinal: Negative for nausea, vomiting, abdominal pain, diarrhea, constipation, blood in stool and abdominal distention.  Genitourinary: Negative for dysuria, urgency, frequency, hematuria, flank pain, vaginal bleeding, vaginal discharge, difficulty urinating, vaginal pain and pelvic pain.  Musculoskeletal: Negative for joint swelling, arthralgias and gait problem.  Skin: Negative for rash.  Neurological: Negative for dizziness, syncope, speech difficulty, weakness, numbness and headaches.  Hematological: Negative for adenopathy.  Psychiatric/Behavioral: Negative for behavioral problems, dysphoric mood and agitation. The patient is not nervous/anxious.        Objective:   Physical Exam   Constitutional: She is oriented to person, place, and time. She appears well-developed and well-nourished.  HENT:  Head: Normocephalic.  Right Ear: External ear normal.  Left Ear: External ear normal.  Mouth/Throat: Oropharynx is clear and moist.  Erythema of the oropharynx but no exudate No adenopathy  Eyes: Conjunctivae and EOM are normal. Pupils are equal, round, and reactive to light.  Neck: Normal range of motion. Neck supple. No thyromegaly present.  Cardiovascular: Normal rate, regular rhythm, normal heart sounds and intact distal pulses.   Pulmonary/Chest: Effort normal and breath sounds normal.  Abdominal: Soft. Bowel sounds are normal. She exhibits no mass. There is no tenderness.  Musculoskeletal: Normal range of motion.  Lymphadenopathy:    She has no cervical adenopathy.  Neurological: She is alert and oriented to person, place, and time.  Skin: Skin is warm and dry. No rash noted.  Psychiatric: She has a normal mood and affect. Her behavior is normal.          Assessment & Plan:   Pharyngitis. Probable viral. Will observe for the next few days. Will give a prescription for the next Magic mouthwash for future use if symptoms worsen and she develops an exudative lesions. Proper use of Advair discussed  with mouth resting following each use COPD

## 2013-05-01 ENCOUNTER — Ambulatory Visit (INDEPENDENT_AMBULATORY_CARE_PROVIDER_SITE_OTHER): Payer: Medicare Other | Admitting: Family Medicine

## 2013-05-01 ENCOUNTER — Encounter: Payer: Self-pay | Admitting: Family Medicine

## 2013-05-01 VITALS — BP 124/80 | HR 74 | Temp 98.3°F | Wt 149.0 lb

## 2013-05-01 DIAGNOSIS — J209 Acute bronchitis, unspecified: Secondary | ICD-10-CM | POA: Diagnosis not present

## 2013-05-01 DIAGNOSIS — H3530 Unspecified macular degeneration: Secondary | ICD-10-CM | POA: Diagnosis not present

## 2013-05-01 DIAGNOSIS — H35059 Retinal neovascularization, unspecified, unspecified eye: Secondary | ICD-10-CM | POA: Diagnosis not present

## 2013-05-01 MED ORDER — AMOXICILLIN-POT CLAVULANATE 875-125 MG PO TABS: 1.0000 | ORAL_TABLET | Freq: Two times a day (BID) | ORAL | Status: DC

## 2013-05-01 NOTE — Progress Notes (Signed)
Subjective:    Patient ID: Holly [name], female    DOB: Apr 06, 1928, 79 y.o.   MRN: [id number]  HPI Here for one week of coughing up green sputum. No fever. No more SOB than usual. She will be leaving on a trip to [place] later this week.    Review of Systems  Constitutional: Negative.   HENT: Negative.   Eyes: Negative.   Respiratory: Positive for cough and shortness of breath.   Cardiovascular: Negative.        Objective:   Physical Exam  Constitutional: She appears well-developed and well-nourished.  HENT:  Right Ear: External ear normal.  Left Ear: External ear normal.  Nose: Nose normal.  Mouth/Throat: Oropharynx is clear and moist.  Eyes: Conjunctivae are normal.  Pulmonary/Chest: Effort normal. No respiratory distress. She has no rales.  Soft rhonchi and wheezes   Lymphadenopathy:    She has no cervical adenopathy.          Assessment & Plan:  Add Mucinex.

## 2013-06-14 ENCOUNTER — Ambulatory Visit (INDEPENDENT_AMBULATORY_CARE_PROVIDER_SITE_OTHER): Payer: Medicare Other | Admitting: Internal Medicine

## 2013-06-14 ENCOUNTER — Encounter: Payer: Self-pay | Admitting: Internal Medicine

## 2013-06-14 VITALS — BP 120/70 | HR 62 | Temp 98.4°F | Resp 20 | Wt 148.0 lb

## 2013-06-14 DIAGNOSIS — S8010XA Contusion of unspecified lower leg, initial encounter: Secondary | ICD-10-CM | POA: Diagnosis not present

## 2013-06-14 DIAGNOSIS — J449 Chronic obstructive pulmonary disease, unspecified: Secondary | ICD-10-CM | POA: Diagnosis not present

## 2013-06-14 DIAGNOSIS — S8011XA Contusion of right lower leg, initial encounter: Secondary | ICD-10-CM

## 2013-06-14 DIAGNOSIS — J4489 Other specified chronic obstructive pulmonary disease: Secondary | ICD-10-CM

## 2013-06-14 NOTE — Progress Notes (Signed)
Subjective:    Patient ID: Holly [name], female    DOB: 05-18-1928, 79 y.o.   MRN: [id number]  HPI  79 year old patient who is seen today for followup. She tripped 2 days ago sustaining trauma to her right anterior lower leg. Her husband has a very recent history of similar trauma, by hematoma formation and secondary cellulitis. She was concerned about these complications. The  Past Medical History  Diagnosis Date  . CELLULITIS 06/18/2008  . CHRONIC OBSTRUCTIVE PULMONARY DISEASE, ACUTE EXACERBATION 11/16/2007  . COPD 05/18/2007  . GERD 12/25/2007  . HYPERLIPIDEMIA 05/18/2007  . HYPOTHYROIDISM 05/18/2007  . Urosepsis   . Macular degeneration   . CHF (congestive heart failure)   . CAD (coronary artery disease)     History   Social History  . Marital Status: Married    Spouse Name: N/A    Number of Children: N/A  . Years of Education: N/A   Occupational History  . Not on file.   Social History Main Topics  . Smoking status: Former Smoker    Quit date: 11/29/1985  . Smokeless tobacco: Never Used  . Alcohol Use: No  . Drug Use: No  . Sexually Active: Not on file   Other Topics Concern  . Not on file   Social History Narrative  . No narrative on file    Past Surgical History  Procedure Laterality Date  . Cataract extraction    . Varicose vein surgery    . Dilation and curettage of uterus      No family history on file.  Allergies  Allergen Reactions  . Levofloxacin     REACTION: unspecified  . Macrobid (Nitrofurantoin Macrocrystal) Nausea Only  . Sulfamethoxazole     REACTION: unspecified  . Tramadol Other (See Comments)    Shakes     Current Outpatient Prescriptions on File Prior to Visit  Medication Sig Dispense Refill  . albuterol (PROVENTIL HFA) 108 (90 BASE) MCG/ACT inhaler Inhale 2 puffs into the lungs every 6 (six) hours as needed.  3 Inhaler  6  . aspirin 81 MG tablet Take 81 mg by mouth daily.        Marland Kitchen atorvastatin (LIPITOR) 10 MG tablet Take 1  tablet (10 mg total) by mouth daily.  90 tablet  3  . Fluticasone-Salmeterol (ADVAIR DISKUS) 500-50 MCG/DOSE AEPB Inhale 1 puff into the lungs every 12 (twelve) hours.  180 each  1  . levothyroxine (SYNTHROID, LEVOTHROID) 112 MCG tablet Take 1 tablet (112 mcg total) by mouth daily.  90 tablet  3  . pantoprazole (PROTONIX) 40 MG tablet Take 1 tablet (40 mg total) by mouth daily.  90 tablet  6   No current facility-administered medications on file prior to visit.    BP 120/70  Pulse 62  Temp(Src) 98.4 F (36.9 C) (Oral)  Resp 20  Wt 148 lb (67.132 kg)  BMI 27.06 kg/m2  SpO2 97%       Review of Systems  Constitutional: Negative.   HENT: Negative for hearing loss, congestion, sore throat, rhinorrhea, dental problem, sinus pressure and tinnitus.   Eyes: Negative for pain, discharge and visual disturbance.  Respiratory: Negative for cough and shortness of breath.   Cardiovascular: Negative for chest pain, palpitations and leg swelling.  Gastrointestinal: Negative for nausea, vomiting, abdominal pain, diarrhea, constipation, blood in stool and abdominal distention.  Genitourinary: Negative for dysuria, urgency, frequency, hematuria, flank pain, vaginal bleeding, vaginal discharge, difficulty urinating, vaginal pain and pelvic pain.  Musculoskeletal:  Negative for joint swelling, arthralgias and gait problem.  Skin: Positive for rash.  Neurological: Negative for dizziness, syncope, speech difficulty, weakness, numbness and headaches.  Hematological: Negative for adenopathy.  Psychiatric/Behavioral: Negative for behavioral problems, dysphoric mood and agitation. The patient is not nervous/anxious.        Objective:   Physical Exam  Constitutional: She is oriented to person, place, and time. She appears well-developed and well-nourished.  HENT:  Head: Normocephalic.  Right Ear: External ear normal.  Left Ear: External ear normal.  Mouth/Throat: Oropharynx is clear and moist.  Eyes:  Conjunctivae and EOM are normal. Pupils are equal, round, and reactive to light.  Neck: Normal range of motion. Neck supple. No thyromegaly present.  Cardiovascular: Normal rate, regular rhythm, normal heart sounds and intact distal pulses.   Pulmonary/Chest: Effort normal and breath sounds normal.  Abdominal: Soft. Bowel sounds are normal. She exhibits no mass. There is no tenderness.  Musculoskeletal: Normal range of motion.  Lymphadenopathy:    She has no cervical adenopathy.  Neurological: She is alert and oriented to person, place, and time.  Skin: Skin is warm and dry. No rash noted.  Large ecchymotic area involving anterior right lower leg. There is no significant hematoma formation or skin breakage. No evidence of cellulitis  Psychiatric: She has a normal mood and affect. Her behavior is normal.          Assessment & Plan:

## 2013-06-14 NOTE — Patient Instructions (Signed)
Limit your sodium (Salt) intake  Keep right leg elevated as much as possible

## 2013-06-19 ENCOUNTER — Telehealth: Payer: Self-pay | Admitting: Internal Medicine

## 2013-06-19 ENCOUNTER — Encounter: Payer: Self-pay | Admitting: Family Medicine

## 2013-06-19 ENCOUNTER — Other Ambulatory Visit: Payer: Self-pay | Admitting: Internal Medicine

## 2013-06-19 ENCOUNTER — Ambulatory Visit (INDEPENDENT_AMBULATORY_CARE_PROVIDER_SITE_OTHER): Payer: Medicare Other | Admitting: Family Medicine

## 2013-06-19 VITALS — BP 118/80 | Temp 98.2°F | Wt 148.0 lb

## 2013-06-19 DIAGNOSIS — I998 Other disorder of circulatory system: Secondary | ICD-10-CM

## 2013-06-19 DIAGNOSIS — R58 Hemorrhage, not elsewhere classified: Secondary | ICD-10-CM

## 2013-06-19 NOTE — Telephone Encounter (Signed)
Patient Information:  Caller Name: [name]  Phone: [phone]  Patient: Holly [name], Holly [name]  Gender: Female  DOB: [birth year]  Age: 79 Years  PCP: Eleonore Chiquito [facility])  Office Follow Up:  Does the office need to follow up with this patient?: Yes  Instructions For The Office: Please contact caller regarding possible appointment today.  States she is approx 1 hour from office.  Thank you.   Symptoms  Reason For Call & Symptoms: Daughter calling. States she was seen for injury to leg on 7/17.  Starting 06/17/13 blisters have been appearing and it is starting to feel warmer. Pain rated at 3-4 of 10.  Emergent symptoms ruled out.  Go to Office Now due to "Looks infected, (e.g., spreading redness, pus, red streak".  Reviewed Health History In EMR: Yes  Reviewed Medications In EMR: Yes  Reviewed Allergies In EMR: Yes  Reviewed Surgeries / Procedures: Yes  Date of Onset of Symptoms: 06/17/2013  Guideline(s) Used:  Leg Injury  Disposition Per Guideline:   Go to Office Now  Reason For Disposition Reached:   Looks infected (e.g., spreading redness, pus, red streak)  Advice Given:  Elevate the Leg:  Lay down and put your leg on a pillow. This puts (elevates) the leg above the heart.  This can also help decrease swelling, bruising, and pain.  Rest vs. Movement:  Movement is generally more healing in the long term than rest.  Continue normal activities (like walking) as much as your pain permits.  Call Back If:  Pain becomes severe  You become worse.  Patient Will Follow Care Advice:  YES

## 2013-06-19 NOTE — Patient Instructions (Addendum)
-  continue to elevate, can do ice for bruising  -follow up as needed

## 2013-06-19 NOTE — Progress Notes (Signed)
Chief Complaint  Patient presents with  . right leg bruising    HPI:  Acute visit for hematoma of leg: -mechanical fall on stairs and hit shin last week -seen by PCP on 7/17 for this and per notes felt was hematoma qne instructions included keeping leg elevated and limiting salt intake -pt daughter thinks looks worse now so told by nurse to come to office now -per PCP OV notes reviewed exam at that time showed "Large ecchymotic area involving anterior right lower leg. There is no significant hematoma formation or skin breakage. No evidence of cellulitis" -denies: pain, fevers, chills, malaise, nausea, spreading of redness or bruise    ROS: See pertinent positives and negatives per HPI.  Past Medical History  Diagnosis Date  . CELLULITIS 06/18/2008  . CHRONIC OBSTRUCTIVE PULMONARY DISEASE, ACUTE EXACERBATION 11/16/2007  . COPD 05/18/2007  . GERD 12/25/2007  . HYPERLIPIDEMIA 05/18/2007  . HYPOTHYROIDISM 05/18/2007  . Urosepsis   . Macular degeneration   . CHF (congestive heart failure)   . CAD (coronary artery disease)     No family history on file.  History   Social History  . Marital Status: Married    Spouse Name: N/A    Number of Children: N/A  . Years of Education: N/A   Social History Main Topics  . Smoking status: Former Smoker    Quit date: 11/29/1985  . Smokeless tobacco: Never Used  . Alcohol Use: No  . Drug Use: No  . Sexually Active: None   Other Topics Concern  . None   Social History Narrative  . None    Current outpatient prescriptions:albuterol (PROVENTIL HFA) 108 (90 BASE) MCG/ACT inhaler, Inhale 2 puffs into the lungs every 6 (six) hours as needed., Disp: 3 Inhaler, Rfl: 6;  aspirin 81 MG tablet, Take 81 mg by mouth daily.  , Disp: , Rfl: ;  atorvastatin (LIPITOR) 10 MG tablet, Take 1 tablet (10 mg total) by mouth daily., Disp: 90 tablet, Rfl: 3 Fluticasone-Salmeterol (ADVAIR DISKUS) 500-50 MCG/DOSE AEPB, Inhale 1 puff into the lungs every 12  (twelve) hours., Disp: 180 each, Rfl: 1;  levothyroxine (SYNTHROID, LEVOTHROID) 112 MCG tablet, Take 1 tablet (112 mcg total) by mouth daily., Disp: 90 tablet, Rfl: 3;  pantoprazole (PROTONIX) 40 MG tablet, Take 1 tablet (40 mg total) by mouth daily., Disp: 90 tablet, Rfl: 6  EXAM:  Filed Vitals:   06/19/13 1417  BP: 118/80  Temp: 98.2 F (36.8 C)    Body mass index is 27.06 kg/(m^2).  GENERAL: vitals reviewed and listed above, alert, oriented, appears well hydrated and in no acute distress  HEENT: atraumatic, conjunttiva clear, no obvious abnormalities on inspection of external nose and ears  NECK: no obvious masses on inspection  SKIN: ecchymosis R shin with small blood blister - no warmth, pus, induration or signs of infection  PSYCH: pleasant and cooperative, no obvious depression or anxiety  ASSESSMENT AND PLAN:  Discussed the following assessment and plan:  Ecchymosis  -no signs of infection at this time - very anxious patient -advised if worried to follow up with PCP an 2 days to recheck and ensure healing well - of course if any concerns in the meantime (warmth, increased swelling or redness, fevers, etc) she was advised to call -Patient advised to return or notify a doctor immediately if symptoms worsen or persist or new concerns arise.  Patient Instructions  -continue to elevate, can do ice for bruising  -follow up as needed  Colin Benton R.

## 2013-06-19 NOTE — Telephone Encounter (Signed)
Per Lupita Leash, please work in w/ another provider. Sched appt w/ Dr Selena Batten at 2:15. Family & pt aware. Encounter closed.

## 2013-06-22 ENCOUNTER — Other Ambulatory Visit: Payer: Self-pay

## 2013-06-25 ENCOUNTER — Ambulatory Visit: Payer: Medicare Other | Admitting: Internal Medicine

## 2013-07-17 ENCOUNTER — Encounter: Payer: Self-pay | Admitting: Internal Medicine

## 2013-07-17 ENCOUNTER — Ambulatory Visit (INDEPENDENT_AMBULATORY_CARE_PROVIDER_SITE_OTHER): Payer: Medicare Other | Admitting: Internal Medicine

## 2013-07-17 VITALS — BP 122/80 | HR 61 | Temp 97.8°F | Resp 20 | Wt 149.0 lb

## 2013-07-17 DIAGNOSIS — I2510 Atherosclerotic heart disease of native coronary artery without angina pectoris: Secondary | ICD-10-CM | POA: Diagnosis not present

## 2013-07-17 DIAGNOSIS — J4489 Other specified chronic obstructive pulmonary disease: Secondary | ICD-10-CM

## 2013-07-17 DIAGNOSIS — J449 Chronic obstructive pulmonary disease, unspecified: Secondary | ICD-10-CM

## 2013-07-17 DIAGNOSIS — L500 Allergic urticaria: Secondary | ICD-10-CM | POA: Diagnosis not present

## 2013-07-17 MED ORDER — METHYLPREDNISOLONE ACETATE 80 MG/ML IJ SUSP
80.0000 mg | Freq: Once | INTRAMUSCULAR | Status: AC
  Administered 2013-07-17: 40 mg via INTRAMUSCULAR

## 2013-07-17 NOTE — Patient Instructions (Signed)

## 2013-07-17 NOTE — Progress Notes (Signed)
Subjective:    Patient ID: Holly [name], female    DOB: [birth year], 79 y.o.   MRN: [id number]  HPI  79 year old patient who is seen today for evaluation of hives. These began late yesterday afternoon and were fairly generalized. There were no, complaints or wheezing. She does have a history of COPD as well as CAD. She has been using Benadryl and the hives have largely resolved. She still has some mild pruritus and erythema but no further urticaria. She is on no new medications and there does not appear to be any pertinent exposure history  Past Medical History  Diagnosis Date  . CELLULITIS 06/18/2008  . CHRONIC OBSTRUCTIVE PULMONARY DISEASE, ACUTE EXACERBATION 11/16/2007  . COPD 05/18/2007  . GERD 12/25/2007  . HYPERLIPIDEMIA 05/18/2007  . HYPOTHYROIDISM 05/18/2007  . Urosepsis   . Macular degeneration   . CHF (congestive heart failure)   . CAD (coronary artery disease)     History   Social History  . Marital Status: Married    Spouse Name: N/A    Number of Children: N/A  . Years of Education: N/A   Occupational History  . Not on file.   Social History Main Topics  . Smoking status: Former Smoker    Quit date: 11/29/1985  . Smokeless tobacco: Never Used  . Alcohol Use: No  . Drug Use: No  . Sexual Activity: Not on file   Other Topics Concern  . Not on file   Social History Narrative  . No narrative on file    Past Surgical History  Procedure Laterality Date  . Cataract extraction    . Varicose vein surgery    . Dilation and curettage of uterus      No family history on file.  Allergies  Allergen Reactions  . Levofloxacin     REACTION: unspecified  . Macrobid [Nitrofurantoin Macrocrystal] Nausea Only  . Sulfamethoxazole     REACTION: unspecified  . Tramadol Other (See Comments)    Shakes     Current Outpatient Prescriptions on File Prior to Visit  Medication Sig Dispense Refill  . albuterol (PROVENTIL HFA) 108 (90 BASE) MCG/ACT inhaler Inhale 2 puffs  into the lungs every 6 (six) hours as needed.  3 Inhaler  6  . aspirin 81 MG tablet Take 81 mg by mouth daily.        Marland Kitchen atorvastatin (LIPITOR) 10 MG tablet Take 1 tablet (10 mg total) by mouth daily.  90 tablet  3  . Fluticasone-Salmeterol (ADVAIR DISKUS) 500-50 MCG/DOSE AEPB Inhale 1 puff into the lungs every 12 (twelve) hours.  180 each  1  . levothyroxine (SYNTHROID, LEVOTHROID) 112 MCG tablet Take 1 tablet (112 mcg total) by mouth daily.  90 tablet  3  . pantoprazole (PROTONIX) 40 MG tablet Take 1 tablet (40 mg total) by mouth daily.  90 tablet  6   No current facility-administered medications on file prior to visit.    BP 122/80  Pulse 61  Temp(Src) 97.8 F (36.6 C) (Oral)  Resp 20  Wt 149 lb (67.586 kg)  BMI 27.25 kg/m2  SpO2 95%       Review of Systems  Constitutional: Negative.   HENT: Negative for hearing loss, congestion, sore throat, rhinorrhea, dental problem, sinus pressure and tinnitus.   Eyes: Negative for pain, discharge and visual disturbance.  Respiratory: Negative for cough and shortness of breath.   Cardiovascular: Negative for chest pain, palpitations and leg swelling.  Gastrointestinal: Negative for nausea, vomiting, abdominal  pain, diarrhea, constipation, blood in stool and abdominal distention.  Genitourinary: Negative for dysuria, urgency, frequency, hematuria, flank pain, vaginal bleeding, vaginal discharge, difficulty urinating, vaginal pain and pelvic pain.  Musculoskeletal: Negative for joint swelling, arthralgias and gait problem.  Skin: Positive for rash.  Neurological: Negative for dizziness, syncope, speech difficulty, weakness, numbness and headaches.  Hematological: Negative for adenopathy.  Psychiatric/Behavioral: Negative for behavioral problems, dysphoric mood and agitation. The patient is not nervous/anxious.        Objective:   Physical Exam  Constitutional: She appears well-developed and well-nourished. No distress.  Pulmonary/Chest:   Diminished breath sounds but clear. No wheezing  Skin:  Slight erythema involving the left in her forearm but no urticarial lesions at this time          Assessment & Plan:   Urticaria. Unclear etiology. Will treat with 40 mg of Depo-Medrol and observe. She will continue Benadryl when necessary

## 2013-08-31 DIAGNOSIS — H35059 Retinal neovascularization, unspecified, unspecified eye: Secondary | ICD-10-CM | POA: Diagnosis not present

## 2013-08-31 DIAGNOSIS — H3530 Unspecified macular degeneration: Secondary | ICD-10-CM | POA: Diagnosis not present

## 2013-09-03 ENCOUNTER — Ambulatory Visit (INDEPENDENT_AMBULATORY_CARE_PROVIDER_SITE_OTHER): Payer: Medicare Other | Admitting: Family

## 2013-09-03 ENCOUNTER — Encounter: Payer: Self-pay | Admitting: Family

## 2013-09-03 VITALS — BP 122/80 | HR 62 | Wt 149.0 lb

## 2013-09-03 DIAGNOSIS — N390 Urinary tract infection, site not specified: Secondary | ICD-10-CM | POA: Diagnosis not present

## 2013-09-03 DIAGNOSIS — Z23 Encounter for immunization: Secondary | ICD-10-CM

## 2013-09-03 DIAGNOSIS — R300 Dysuria: Secondary | ICD-10-CM | POA: Diagnosis not present

## 2013-09-03 LAB — POCT URINALYSIS DIPSTICK
Bilirubin, UA: NEGATIVE
Glucose, UA: NEGATIVE
Ketones, UA: NEGATIVE
Nitrite, UA: NEGATIVE
Spec Grav, UA: 1.025
Urobilinogen, UA: NEGATIVE
pH, UA: 5.5

## 2013-09-03 MED ORDER — CEFDINIR 300 MG PO CAPS: 300.0000 mg | ORAL_CAPSULE | Freq: Two times a day (BID) | ORAL | Status: DC

## 2013-09-03 NOTE — Progress Notes (Signed)
Subjective:    Patient ID: Holly [name], female    DOB: July 14, 1928, 79 y.o.   MRN: [id number]  HPI 79 year old white female, nonsmoker is in today with complaints of urinary frequency, burning with urination, foul-smelling urine x2 days and worsening. Has not taken anything over-the-counter.  Review of Systems  Constitutional: Negative.   HENT: Negative.   Respiratory: Negative.   Cardiovascular: Negative.   Gastrointestinal: Negative.   Genitourinary: Positive for dysuria, urgency, frequency, hematuria and dyspareunia.  Musculoskeletal: Negative.   Skin: Negative.   Psychiatric/Behavioral: Negative.    Past Medical History  Diagnosis Date  . CELLULITIS 06/18/2008  . CHRONIC OBSTRUCTIVE PULMONARY DISEASE, ACUTE EXACERBATION 11/16/2007  . COPD 05/18/2007  . GERD 12/25/2007  . HYPERLIPIDEMIA 05/18/2007  . HYPOTHYROIDISM 05/18/2007  . Urosepsis   . Macular degeneration   . CHF (congestive heart failure)   . CAD (coronary artery disease)     History   Social History  . Marital Status: Married    Spouse Name: N/A    Number of Children: N/A  . Years of Education: N/A   Occupational History  . Not on file.   Social History Main Topics  . Smoking status: Former Smoker    Quit date: 11/29/1985  . Smokeless tobacco: Never Used  . Alcohol Use: No  . Drug Use: No  . Sexual Activity: Not on file   Other Topics Concern  . Not on file   Social History Narrative  . No narrative on file    Past Surgical History  Procedure Laterality Date  . Cataract extraction    . Varicose vein surgery    . Dilation and curettage of uterus      No family history on file.  Allergies  Allergen Reactions  . Levofloxacin     REACTION: unspecified  . Macrobid [Nitrofurantoin Macrocrystal] Nausea Only  . Sulfamethoxazole     REACTION: unspecified  . Tramadol Other (See Comments)    Shakes     Current Outpatient Prescriptions on File Prior to Visit  Medication Sig Dispense Refill   . albuterol (PROVENTIL HFA) 108 (90 BASE) MCG/ACT inhaler Inhale 2 puffs into the lungs every 6 (six) hours as needed.  3 Inhaler  6  . aspirin 81 MG tablet Take 81 mg by mouth daily.        Marland Kitchen atorvastatin (LIPITOR) 10 MG tablet Take 1 tablet (10 mg total) by mouth daily.  90 tablet  3  . Fluticasone-Salmeterol (ADVAIR DISKUS) 500-50 MCG/DOSE AEPB Inhale 1 puff into the lungs every 12 (twelve) hours.  180 each  1  . levothyroxine (SYNTHROID, LEVOTHROID) 112 MCG tablet Take 1 tablet (112 mcg total) by mouth daily.  90 tablet  3  . pantoprazole (PROTONIX) 40 MG tablet Take 1 tablet (40 mg total) by mouth daily.  90 tablet  6   No current facility-administered medications on file prior to visit.    BP 122/80  Pulse 62  Wt 149 lb (67.586 kg)  BMI 27.25 kg/m2chart    Objective:   Physical Exam  Constitutional: She is oriented to person, place, and time. She appears well-developed and well-nourished.  Neck: Normal range of motion. Neck supple.  Cardiovascular: Normal rate, regular rhythm and normal heart sounds.   Pulmonary/Chest: Effort normal and breath sounds normal.  Abdominal: Bowel sounds are normal.  Musculoskeletal: Normal range of motion.  Neurological: She is alert and oriented to person, place, and time.  Skin: Skin is warm and dry.  Psychiatric:  She has a normal mood and affect.          Assessment & Plan:  Assessment: 1. Urinary tract infection 2. Urinary frequency  Plan: Continue current medications. Omnicef 300 mg one tablet twice a day x7 days. Drink plenty of water. Patient to call if symptoms worsen or persist. Recheck as scheduled, and as needed.

## 2013-09-03 NOTE — Patient Instructions (Signed)
Urinary Tract Infection  Urinary tract infections (UTIs) can develop anywhere along your urinary tract. Your urinary tract is your body's drainage system for removing wastes and extra water. Your urinary tract includes two kidneys, two ureters, a bladder, and a urethra. Your kidneys are a pair of bean-shaped organs. Each kidney is about the size of your fist. They are located below your ribs, one on each side of your spine.  CAUSES  Infections are caused by microbes, which are microscopic organisms, including fungi, viruses, and bacteria. These organisms are so small that they can only be seen through a microscope. Bacteria are the microbes that most commonly cause UTIs.  SYMPTOMS   Symptoms of UTIs may vary by age and gender of the patient and by the location of the infection. Symptoms in young women typically include a frequent and intense urge to urinate and a painful, burning feeling in the bladder or urethra during urination. Older women and men are more likely to be tired, shaky, and weak and have muscle aches and abdominal pain. A fever may mean the infection is in your kidneys. Other symptoms of a kidney infection include pain in your back or sides below the ribs, nausea, and vomiting.  DIAGNOSIS  To diagnose a UTI, your caregiver will ask you about your symptoms. Your caregiver also will ask to provide a urine sample. The urine sample will be tested for bacteria and white blood cells. White blood cells are made by your body to help fight infection.  TREATMENT   Typically, UTIs can be treated with medication. Because most UTIs are caused by a bacterial infection, they usually can be treated with the use of antibiotics. The choice of antibiotic and length of treatment depend on your symptoms and the type of bacteria causing your infection.  HOME CARE INSTRUCTIONS   If you were prescribed antibiotics, take them exactly as your caregiver instructs you. Finish the medication even if you feel better after you  have only taken some of the medication.   Drink enough water and fluids to keep your urine clear or pale yellow.   Avoid caffeine, tea, and carbonated beverages. They tend to irritate your bladder.   Empty your bladder often. Avoid holding urine for long periods of time.   Empty your bladder before and after sexual intercourse.   After a bowel movement, women should cleanse from front to back. Use each tissue only once.  SEEK MEDICAL CARE IF:    You have back pain.   You develop a fever.   Your symptoms do not begin to resolve within 3 days.  SEEK IMMEDIATE MEDICAL CARE IF:    You have severe back pain or lower abdominal pain.   You develop chills.   You have nausea or vomiting.   You have continued burning or discomfort with urination.  MAKE SURE YOU:    Understand these instructions.   Will watch your condition.   Will get help right away if you are not doing well or get worse.  Document Released: 08/25/2005 Document Revised: 05/16/2012 Document Reviewed: 12/24/2011  [employer] Patient Information 2014 [employer]

## 2013-09-17 ENCOUNTER — Encounter: Payer: Self-pay | Admitting: Internal Medicine

## 2013-09-17 ENCOUNTER — Ambulatory Visit (INDEPENDENT_AMBULATORY_CARE_PROVIDER_SITE_OTHER): Payer: Medicare Other | Admitting: Internal Medicine

## 2013-09-17 VITALS — BP 120/70 | HR 72 | Temp 98.0°F | Resp 20 | Wt 150.0 lb

## 2013-09-17 DIAGNOSIS — L299 Pruritus, unspecified: Secondary | ICD-10-CM

## 2013-09-17 DIAGNOSIS — E785 Hyperlipidemia, unspecified: Secondary | ICD-10-CM

## 2013-09-17 DIAGNOSIS — Z23 Encounter for immunization: Secondary | ICD-10-CM

## 2013-09-17 DIAGNOSIS — J449 Chronic obstructive pulmonary disease, unspecified: Secondary | ICD-10-CM

## 2013-09-17 MED ORDER — METHYLPREDNISOLONE ACETATE 80 MG/ML IJ SUSP
80.0000 mg | Freq: Once | INTRAMUSCULAR | Status: AC
  Administered 2013-09-17: 40 mg via INTRAMUSCULAR

## 2013-09-17 NOTE — Progress Notes (Signed)
Subjective:    Patient ID: Holly [name], female    DOB: 10-30-1928, 79 y.o.   MRN: [id number]  HPI  79 year old patient has a history of COPD and dyslipidemia. She presents with a one-week history of generalized itching without rash that began while taking Omnicef for a UTI. Otherwise doing quite well. Her poor status has been stable. He  Past Medical History  Diagnosis Date  . CELLULITIS 06/18/2008  . CHRONIC OBSTRUCTIVE PULMONARY DISEASE, ACUTE EXACERBATION 11/16/2007  . COPD 05/18/2007  . GERD 12/25/2007  . HYPERLIPIDEMIA 05/18/2007  . HYPOTHYROIDISM 05/18/2007  . Urosepsis   . Macular degeneration   . CHF (congestive heart failure)   . CAD (coronary artery disease)     History   Social History  . Marital Status: Married    Spouse Name: N/A    Number of Children: N/A  . Years of Education: N/A   Occupational History  . Not on file.   Social History Main Topics  . Smoking status: Former Smoker    Quit date: 11/29/1985  . Smokeless tobacco: Never Used  . Alcohol Use: No  . Drug Use: No  . Sexual Activity: Not on file   Other Topics Concern  . Not on file   Social History Narrative  . No narrative on file    Past Surgical History  Procedure Laterality Date  . Cataract extraction    . Varicose vein surgery    . Dilation and curettage of uterus      No family history on file.  Allergies  Allergen Reactions  . Levofloxacin     REACTION: unspecified  . Macrobid [Nitrofurantoin Macrocrystal] Nausea Only  . Sulfamethoxazole     REACTION: unspecified  . Tramadol Other (See Comments)    Shakes     Current Outpatient Prescriptions on File Prior to Visit  Medication Sig Dispense Refill  . albuterol (PROVENTIL HFA) 108 (90 BASE) MCG/ACT inhaler Inhale 2 puffs into the lungs every 6 (six) hours as needed.  3 Inhaler  6  . aspirin 81 MG tablet Take 81 mg by mouth daily.        Marland Kitchen atorvastatin (LIPITOR) 10 MG tablet Take 1 tablet (10 mg total) by mouth daily.  90  tablet  3  . Fluticasone-Salmeterol (ADVAIR DISKUS) 500-50 MCG/DOSE AEPB Inhale 1 puff into the lungs every 12 (twelve) hours.  180 each  1  . levothyroxine (SYNTHROID, LEVOTHROID) 112 MCG tablet Take 1 tablet (112 mcg total) by mouth daily.  90 tablet  3  . pantoprazole (PROTONIX) 40 MG tablet Take 1 tablet (40 mg total) by mouth daily.  90 tablet  6   No current facility-administered medications on file prior to visit.    BP 120/70  Pulse 72  Temp(Src) 98 F (36.7 C) (Oral)  Resp 20  Wt 150 lb (68.04 kg)  BMI 27.43 kg/m2  SpO2 94%       Review of Systems  Constitutional: Negative.   HENT: Negative for congestion, dental problem, hearing loss, rhinorrhea, sinus pressure, sore throat and tinnitus.   Eyes: Negative for pain, discharge and visual disturbance.  Respiratory: Negative for cough and shortness of breath.   Cardiovascular: Negative for chest pain, palpitations and leg swelling.  Gastrointestinal: Negative for nausea, vomiting, abdominal pain, diarrhea, constipation, blood in stool and abdominal distention.  Genitourinary: Negative for dysuria, urgency, frequency, hematuria, flank pain, vaginal bleeding, vaginal discharge, difficulty urinating, vaginal pain and pelvic pain.  Musculoskeletal: Negative for arthralgias, gait problem  and joint swelling.  Skin: Negative for rash.  Neurological: Negative for dizziness, syncope, speech difficulty, weakness, numbness and headaches.  Hematological: Negative for adenopathy.  Psychiatric/Behavioral: Negative for behavioral problems, dysphoric mood and agitation. The patient is not nervous/anxious.        Objective:   Physical Exam  Constitutional: She is oriented to person, place, and time. She appears well-developed and well-nourished.  HENT:  Head: Normocephalic.  Right Ear: External ear normal.  Left Ear: External ear normal.  Mouth/Throat: Oropharynx is clear and moist.  Eyes: Conjunctivae and EOM are normal. Pupils are  equal, round, and reactive to light.  Neck: Normal range of motion. Neck supple. No thyromegaly present.  Cardiovascular: Normal rate, regular rhythm, normal heart sounds and intact distal pulses.   Pulmonary/Chest: Effort normal and breath sounds normal.  Abdominal: Soft. Bowel sounds are normal. She exhibits no mass. There is no tenderness.  Musculoskeletal: Normal range of motion.  Lymphadenopathy:    She has no cervical adenopathy.  Neurological: She is alert and oriented to person, place, and time.  Skin: Skin is warm and dry. No rash noted.  Psychiatric: She has a normal mood and affect. Her behavior is normal.          Assessment & Plan:   Pruritus. Probably antibiotic associated. We'll treat with Depo-Medrol 40. We'll continue Benadryl when necessary COPD stable Dyslipidemia. Continue statin therapy

## 2013-09-17 NOTE — Patient Instructions (Addendum)
Limit your sodium (Salt) intake  Return in 3 months for follow-up Itching Itching is a symptom that can be caused by many things. These include skin problems (including infections) as well as some internal diseases.  If the itching is affecting just one area of the body, it is most likely due to a common skin problem, such as:  Poison oak and poison ivy.  Contact dermatitis (skin irritation from a plant, chemicals, fiberglass, detergents, new cosmetic, new jewelry, or other substance).  Fungus (such as athlete's foot, jock itch, or ringworm).  Head lice  Dandruff  Insect bite  Infection (such as Shingles or other virus infections). If the itching is all over (widespread), the possible causes are many. These include:   Dry skin or eczema  Heat rash  Hives  Liver disorders  Kidney disorders TREATMENT  Localized itching   Lubrication of the skin. Use an ointment or cream or other unperfumed moisturizers if the skin is dry. Apply frequently, especially after bathing.  Anti-itch medicines. These medications may help control the urge to scratch. Scratching always makes itching worse and increases the chance of getting an infection.  Cortisone creams and ointments. These help reduce the inflammation.  Antibiotics. Skin infections can cause itching. Topical or oral antibiotics may be needed for 10 to 20 days to get rid of an infection. If you can identify what caused the itching, avoid this substance in the future.  Widespread itching  The following measures may help to relieve itching regardless of the cause:   Wash the skin once with soap to remove irritants.  Bathe in tepid water with baking soda, cornstarch, or oatmeal.  Use calamine lotion (nonprescription) or a baking soda solution (1 teaspoon in 4 ounces of water on the skin).  Apply 1% hydrocortisone cream (no prescription needed). Do not use this if there might be a skin infection.  Avoid scratching.  Avoid  itchy or tight-fitting clothes.  Avoid excessive heat, sweating, scented soaps, and swimming pools.  The lubricants, anti-itch medicines, etc. noted above may be helpful for controlling symptoms. SEEK MEDICAL CARE IF:   The itching becomes severe.  Your itch is not better after 1 week of treatment. Contact your caregiver to schedule further evaluation. Document Released: 11/15/2005 Document Revised: 02/07/2012 Document Reviewed: 05/05/2007 [facility] Patient Information 2014 [place], [place].

## 2013-09-24 ENCOUNTER — Ambulatory Visit: Payer: Medicare Other | Admitting: Internal Medicine

## 2013-10-21 ENCOUNTER — Other Ambulatory Visit: Payer: Self-pay | Admitting: Internal Medicine

## 2013-11-21 ENCOUNTER — Other Ambulatory Visit: Payer: Self-pay | Admitting: Internal Medicine

## 2013-12-18 ENCOUNTER — Ambulatory Visit (INDEPENDENT_AMBULATORY_CARE_PROVIDER_SITE_OTHER): Payer: Medicare Other | Admitting: Internal Medicine

## 2013-12-18 ENCOUNTER — Encounter: Payer: Self-pay | Admitting: Internal Medicine

## 2013-12-18 VITALS — BP 140/80 | HR 63 | Temp 97.8°F | Resp 20 | Ht 62.0 in | Wt 148.0 lb

## 2013-12-18 DIAGNOSIS — E785 Hyperlipidemia, unspecified: Secondary | ICD-10-CM | POA: Diagnosis not present

## 2013-12-18 DIAGNOSIS — I2510 Atherosclerotic heart disease of native coronary artery without angina pectoris: Secondary | ICD-10-CM

## 2013-12-18 DIAGNOSIS — J449 Chronic obstructive pulmonary disease, unspecified: Secondary | ICD-10-CM

## 2013-12-18 NOTE — Patient Instructions (Signed)
Limit your sodium (Salt) intake    It is important that you exercise regularly, at least 20 minutes 3 to 4 times per week.  If you develop chest pain or shortness of breath seek  medical attention.  Return in 6 months for follow-up  

## 2013-12-18 NOTE — Progress Notes (Signed)
Pre-visit discussion using our clinic review tool. No additional management support is needed unless otherwise documented below in the visit note.  

## 2013-12-18 NOTE — Progress Notes (Signed)
Subjective:    Patient ID: Holly [name], female    DOB: Mar 29, 1928, 80 y.o.   MRN: [id number]  HPI   80 year old patient who is seen today for followup. She has COPD CAD and dyslipidemia. She has an well over the past 3 months. No major concerns or complaints. Denies any cardiopulmonary complaints. She has treated hypothyroidism. No recent albuterol use. She remains on Advair maintenance medication and  Past Medical History  Diagnosis Date  . CELLULITIS 06/18/2008  . CHRONIC OBSTRUCTIVE PULMONARY DISEASE, ACUTE EXACERBATION 11/16/2007  . COPD 05/18/2007  . GERD 12/25/2007  . HYPERLIPIDEMIA 05/18/2007  . HYPOTHYROIDISM 05/18/2007  . Urosepsis   . Macular degeneration   . CHF (congestive heart failure)   . CAD (coronary artery disease)     History   Social History  . Marital Status: Married    Spouse Name: N/A    Number of Children: N/A  . Years of Education: N/A   Occupational History  . Not on file.   Social History Main Topics  . Smoking status: Former Smoker    Quit date: 11/29/1985  . Smokeless tobacco: Never Used  . Alcohol Use: No  . Drug Use: No  . Sexual Activity: Not on file   Other Topics Concern  . Not on file   Social History Narrative  . No narrative on file    Past Surgical History  Procedure Laterality Date  . Cataract extraction    . Varicose vein surgery    . Dilation and curettage of uterus      No family history on file.  Allergies  Allergen Reactions  . Levofloxacin     REACTION: unspecified  . Macrobid [Nitrofurantoin Macrocrystal] Nausea Only  . Sulfamethoxazole     REACTION: unspecified  . Tramadol Other (See Comments)    Shakes     Current Outpatient Prescriptions on File Prior to Visit  Medication Sig Dispense Refill  . ADVAIR DISKUS 500-50 MCG/DOSE AEPB INHALE 1 PUFF INTO THE LUNGS EVERY 12 (TWELVE) HOURS.  180 each  3  . albuterol (PROVENTIL HFA) 108 (90 BASE) MCG/ACT inhaler Inhale 2 puffs into the lungs every 6 (six) hours  as needed.  3 Inhaler  6  . aspirin 81 MG tablet Take 81 mg by mouth daily.        Marland Kitchen atorvastatin (LIPITOR) 10 MG tablet Take 1 tablet (10 mg total) by mouth daily.  90 tablet  3  . atorvastatin (LIPITOR) 10 MG tablet TAKE 1 TABLET BY MOUTH EVERY DAY  90 tablet  3  . levothyroxine (SYNTHROID, LEVOTHROID) 112 MCG tablet Take 1 tablet (112 mcg total) by mouth daily.  90 tablet  3  . pantoprazole (PROTONIX) 40 MG tablet Take 1 tablet (40 mg total) by mouth daily.  90 tablet  6   No current facility-administered medications on file prior to visit.    BP 140/80  Pulse 63  Temp(Src) 97.8 F (36.6 C) (Oral)  Resp 20  Ht 5\' 2"  (1.575 m)  Wt 148 lb (67.132 kg)  BMI 27.06 kg/m2  SpO2 96%       Review of Systems  Constitutional: Negative.   HENT: Negative for congestion, dental problem, hearing loss, rhinorrhea, sinus pressure, sore throat and tinnitus.   Eyes: Negative for pain, discharge and visual disturbance.  Respiratory: Negative for cough and shortness of breath.   Cardiovascular: Negative for chest pain, palpitations and leg swelling.  Gastrointestinal: Negative for nausea, vomiting, abdominal pain, diarrhea, constipation,  blood in stool and abdominal distention.  Genitourinary: Negative for dysuria, urgency, frequency, hematuria, flank pain, vaginal bleeding, vaginal discharge, difficulty urinating, vaginal pain and pelvic pain.  Musculoskeletal: Negative for arthralgias, gait problem and joint swelling.  Skin: Negative for rash.  Neurological: Negative for dizziness, syncope, speech difficulty, weakness, numbness and headaches.  Hematological: Negative for adenopathy.  Psychiatric/Behavioral: Negative for behavioral problems, dysphoric mood and agitation. The patient is not nervous/anxious.        Objective:   Physical Exam  Constitutional: She is oriented to person, place, and time. She appears well-developed and well-nourished.  HENT:  Head: Normocephalic.  Right Ear:  External ear normal.  Left Ear: External ear normal.  Mouth/Throat: Oropharynx is clear and moist.  Eyes: Conjunctivae and EOM are normal. Pupils are equal, round, and reactive to light.  Neck: Normal range of motion. Neck supple. No thyromegaly present.  Cardiovascular: Normal rate, regular rhythm and normal heart sounds.   Pulmonary/Chest: Effort normal and breath sounds normal.  Abdominal: Soft. Bowel sounds are normal. She exhibits no mass. There is no tenderness.  Musculoskeletal: Normal range of motion.  Lymphadenopathy:    She has no cervical adenopathy.  Neurological: She is alert and oriented to person, place, and time.  Skin: Skin is warm and dry. No rash noted.  Psychiatric: She has a normal mood and affect. Her behavior is normal.          Assessment & Plan:   COPD. Stable CAD. Stable Dyslipidemia. Continue atorvastatin  CPX 6 months

## 2013-12-19 ENCOUNTER — Telehealth: Payer: Self-pay | Admitting: Internal Medicine

## 2013-12-19 NOTE — Telephone Encounter (Signed)
Relevant patient education mailed to patient.  

## 2014-01-01 ENCOUNTER — Encounter: Payer: Self-pay | Admitting: Family

## 2014-01-01 ENCOUNTER — Ambulatory Visit (INDEPENDENT_AMBULATORY_CARE_PROVIDER_SITE_OTHER): Payer: Medicare Other | Admitting: Family

## 2014-01-01 ENCOUNTER — Telehealth: Payer: Self-pay | Admitting: Internal Medicine

## 2014-01-01 VITALS — BP 130/80 | HR 74 | Temp 97.8°F | Wt 145.0 lb

## 2014-01-01 DIAGNOSIS — E039 Hypothyroidism, unspecified: Secondary | ICD-10-CM | POA: Diagnosis not present

## 2014-01-01 DIAGNOSIS — J069 Acute upper respiratory infection, unspecified: Secondary | ICD-10-CM | POA: Diagnosis not present

## 2014-01-01 MED ORDER — AZITHROMYCIN 250 MG PO TABS: 250.0000 mg | ORAL_TABLET | Freq: Every day | ORAL | Status: DC

## 2014-01-01 NOTE — Telephone Encounter (Signed)
Pt following up on rx for zpak to [facility] [address].

## 2014-01-01 NOTE — Progress Notes (Signed)
Subjective:    Patient ID: Holly [name], female    DOB: 1928-01-13, 80 y.o.   MRN: [id number]  HPI  80 year old caucasian female, nonsmoker presents today for cold symptoms. She complains of nasal congestion and cough that is worst at night x 1 week.  She has been phlegm that is turning from yellow to green.  She has not taken any over the counter medications.  She brought her husband in last week for a cold and told Dr. Caroleen Hamman she would be back for her z-pack.    Review of Systems  Constitutional: Negative.   HENT: Positive for congestion.        Nasal congestion  Respiratory: Positive for cough.        Cough more present at night.  Phlegm from yellow to green.  Cardiovascular: Negative.   Musculoskeletal: Negative.   Skin: Negative.   Allergic/Immunologic: Negative.   Psychiatric/Behavioral: Negative.    Past Medical History  Diagnosis Date  . CELLULITIS 06/18/2008  . CHRONIC OBSTRUCTIVE PULMONARY DISEASE, ACUTE EXACERBATION 11/16/2007  . COPD 05/18/2007  . GERD 12/25/2007  . HYPERLIPIDEMIA 05/18/2007  . HYPOTHYROIDISM 05/18/2007  . Urosepsis   . Macular degeneration   . CHF (congestive heart failure)   . CAD (coronary artery disease)     History   Social History  . Marital Status: Married    Spouse Name: N/A    Number of Children: N/A  . Years of Education: N/A   Occupational History  . Not on file.   Social History Main Topics  . Smoking status: Former Smoker    Quit date: 11/29/1985  . Smokeless tobacco: Never Used  . Alcohol Use: No  . Drug Use: No  . Sexual Activity: Not on file   Other Topics Concern  . Not on file   Social History Narrative  . No narrative on file    Past Surgical History  Procedure Laterality Date  . Cataract extraction    . Varicose vein surgery    . Dilation and curettage of uterus      No family history on file.  Allergies  Allergen Reactions  . Levofloxacin     REACTION: unspecified  . Macrobid [Nitrofurantoin  Macrocrystal] Nausea Only  . Sulfamethoxazole     REACTION: unspecified  . Tramadol Other (See Comments)    Shakes     Current Outpatient Prescriptions on File Prior to Visit  Medication Sig Dispense Refill  . ADVAIR DISKUS 500-50 MCG/DOSE AEPB INHALE 1 PUFF INTO THE LUNGS EVERY 12 (TWELVE) HOURS.  180 each  3  . albuterol (PROVENTIL HFA) 108 (90 BASE) MCG/ACT inhaler Inhale 2 puffs into the lungs every 6 (six) hours as needed.  3 Inhaler  6  . aspirin 81 MG tablet Take 81 mg by mouth daily.        Marland Kitchen atorvastatin (LIPITOR) 10 MG tablet Take 1 tablet (10 mg total) by mouth daily.  90 tablet  3  . atorvastatin (LIPITOR) 10 MG tablet TAKE 1 TABLET BY MOUTH EVERY DAY  90 tablet  3  . levothyroxine (SYNTHROID, LEVOTHROID) 112 MCG tablet Take 1 tablet (112 mcg total) by mouth daily.  90 tablet  3  . pantoprazole (PROTONIX) 40 MG tablet Take 1 tablet (40 mg total) by mouth daily.  90 tablet  6   No current facility-administered medications on file prior to visit.    BP 130/80  Pulse 74  Temp(Src) 97.8 F (36.6 C) (Oral)  Wt 145 lb (65.772 kg)  SpO2 98%chart    Objective:   Physical Exam  Constitutional: She is oriented to person, place, and time. She appears well-developed and well-nourished.  HENT:  Right Ear: External ear normal.  Left Ear: External ear normal.  Nose: Nose normal.  Nasal congestion and cough present.  Neck: Normal range of motion. Neck supple.  Cardiovascular: Normal rate and regular rhythm.   Pulmonary/Chest: Effort normal and breath sounds normal.  Neurological: She is alert and oriented to person, place, and time.  Skin: Skin is warm and dry.  Psychiatric: She has a normal mood and affect.          Assessment & Plan:  Assessment 1. Upper Respiratory Infection  Plan 1. Encourage plenty of fluids.  2.  Good handwashing techniques.  3. Z-pak as prescribed. 4. [place] HBP as needed. 5.  Contact office with questions or concerns. Explained to patient  that her symptoms were very likely a viral cause and that a z-pack would not help.  Daughter was adamant that her mother get the z-pack.

## 2014-01-01 NOTE — Progress Notes (Signed)
Pre visit review using our clinic review tool, if applicable. No additional management support is needed unless otherwise documented below in the visit note. 

## 2014-01-01 NOTE — Patient Instructions (Signed)

## 2014-01-01 NOTE — Telephone Encounter (Signed)
done

## 2014-01-04 DIAGNOSIS — H353290 Exudative age-related macular degeneration, unspecified eye, stage unspecified: Secondary | ICD-10-CM | POA: Diagnosis not present

## 2014-03-26 DIAGNOSIS — H3530 Unspecified macular degeneration: Secondary | ICD-10-CM | POA: Diagnosis not present

## 2014-03-26 DIAGNOSIS — H35059 Retinal neovascularization, unspecified, unspecified eye: Secondary | ICD-10-CM | POA: Diagnosis not present

## 2014-04-06 ENCOUNTER — Other Ambulatory Visit: Payer: Self-pay | Admitting: Internal Medicine

## 2014-04-08 NOTE — Telephone Encounter (Signed)
Pt's daughter requesting refill of albuterol (PROVENTIL HFA) 108 (90 BASE) MCG/ACT inhaler. Pharmacy:   [id number] - [place], [place] - [address].

## 2014-05-10 ENCOUNTER — Other Ambulatory Visit: Payer: Self-pay | Admitting: Internal Medicine

## 2014-06-17 ENCOUNTER — Ambulatory Visit: Payer: Medicare Other | Admitting: Internal Medicine

## 2014-06-17 ENCOUNTER — Encounter: Payer: Self-pay | Admitting: Internal Medicine

## 2014-06-17 ENCOUNTER — Ambulatory Visit (INDEPENDENT_AMBULATORY_CARE_PROVIDER_SITE_OTHER): Payer: Medicare Other | Admitting: Internal Medicine

## 2014-06-17 VITALS — BP 110/68 | HR 77 | Temp 97.8°F | Resp 20 | Ht 62.0 in | Wt 143.0 lb

## 2014-06-17 DIAGNOSIS — E785 Hyperlipidemia, unspecified: Secondary | ICD-10-CM | POA: Diagnosis not present

## 2014-06-17 DIAGNOSIS — J4489 Other specified chronic obstructive pulmonary disease: Secondary | ICD-10-CM

## 2014-06-17 DIAGNOSIS — I2510 Atherosclerotic heart disease of native coronary artery without angina pectoris: Secondary | ICD-10-CM | POA: Diagnosis not present

## 2014-06-17 DIAGNOSIS — J449 Chronic obstructive pulmonary disease, unspecified: Secondary | ICD-10-CM

## 2014-06-17 DIAGNOSIS — E039 Hypothyroidism, unspecified: Secondary | ICD-10-CM | POA: Diagnosis not present

## 2014-06-17 LAB — CBC WITH DIFFERENTIAL/PLATELET
Basophils Absolute: 0 10*3/uL (ref 0.0–0.1)
Basophils Relative: 0.6 % (ref 0.0–3.0)
Eosinophils Absolute: 0.2 10*3/uL (ref 0.0–0.7)
Eosinophils Relative: 3.9 % (ref 0.0–5.0)
HCT: 37.3 % (ref 36.0–46.0)
Hemoglobin: 12.3 g/dL (ref 12.0–15.0)
Lymphocytes Relative: 25.3 % (ref 12.0–46.0)
Lymphs Abs: 1.6 10*3/uL (ref 0.7–4.0)
MCHC: 33 g/dL (ref 30.0–36.0)
MCV: 91.3 fl (ref 78.0–100.0)
Monocytes Absolute: 0.6 10*3/uL (ref 0.1–1.0)
Monocytes Relative: 10.5 % (ref 3.0–12.0)
Neutro Abs: 3.7 10*3/uL (ref 1.4–7.7)
Neutrophils Relative %: 59.7 % (ref 43.0–77.0)
Platelets: 253 10*3/uL (ref 150.0–400.0)
RBC: 4.09 Mil/uL (ref 3.87–5.11)
RDW: 15.8 % — ABNORMAL HIGH (ref 11.5–15.5)
WBC: 6.1 10*3/uL (ref 4.0–10.5)

## 2014-06-17 NOTE — Patient Instructions (Signed)
Limit your sodium (Salt) intake    It is important that you exercise regularly, at least 20 minutes 3 to 4 times per week.  If you develop chest pain or shortness of breath seek  medical attention.  Return in 6 months for follow-up  

## 2014-06-17 NOTE — Progress Notes (Signed)
Subjective:    Patient ID: Holly [name], female    DOB: 01-11-1928, 80 y.o.   MRN: [id number]  HPI 80 year old patient who is seen today for her biannual followup.  Medical problems include coronary artery disease, COPD, dyslipidemia, and hypothyroidism.  Her cardiopulmonary status has been quite stable.  No concerns or complaints today.  No recent lab Social history.  Her husband is now being followed by hospice She has been compliant with her medications and  Past Medical History  Diagnosis Date  . CELLULITIS 06/18/2008  . CHRONIC OBSTRUCTIVE PULMONARY DISEASE, ACUTE EXACERBATION 11/16/2007  . COPD 05/18/2007  . GERD 12/25/2007  . HYPERLIPIDEMIA 05/18/2007  . HYPOTHYROIDISM 05/18/2007  . Urosepsis   . Macular degeneration   . CHF (congestive heart failure)   . CAD (coronary artery disease)     History   Social History  . Marital Status: Married    Spouse Name: N/A    Number of Children: N/A  . Years of Education: N/A   Occupational History  . Not on file.   Social History Main Topics  . Smoking status: Former Smoker    Quit date: 11/29/1985  . Smokeless tobacco: Never Used  . Alcohol Use: No  . Drug Use: No  . Sexual Activity: Not on file   Other Topics Concern  . Not on file   Social History Narrative  . No narrative on file    Past Surgical History  Procedure Laterality Date  . Cataract extraction    . Varicose vein surgery    . Dilation and curettage of uterus      No family history on file.  Allergies  Allergen Reactions  . Levofloxacin     REACTION: unspecified  . Macrobid [Nitrofurantoin Macrocrystal] Nausea Only  . Sulfamethoxazole     REACTION: unspecified  . Tramadol Other (See Comments)    Shakes     Current Outpatient Prescriptions on File Prior to Visit  Medication Sig Dispense Refill  . ADVAIR DISKUS 500-50 MCG/DOSE AEPB INHALE 1 PUFF INTO THE LUNGS EVERY 12 (TWELVE) HOURS.  180 each  3  . aspirin 81 MG tablet Take 81 mg by mouth  daily.        Marland Kitchen atorvastatin (LIPITOR) 10 MG tablet TAKE 1 TABLET BY MOUTH EVERY DAY  90 tablet  3  . levothyroxine (SYNTHROID, LEVOTHROID) 112 MCG tablet TAKE 1 TABLET (112 MCG TOTAL) BY MOUTH DAILY.  90 tablet  1  . pantoprazole (PROTONIX) 40 MG tablet Take 1 tablet (40 mg total) by mouth daily.  90 tablet  6  . PROVENTIL HFA 108 (90 BASE) MCG/ACT inhaler INHALE 2 PUFFS EVERY 6 HOURS AS NEEDED  20.1 each  5   No current facility-administered medications on file prior to visit.    BP 110/68  Pulse 77  Temp(Src) 97.8 F (36.6 C) (Oral)  Resp 20  Ht 5\' 2"  (1.575 m)  Wt 143 lb (64.864 kg)  BMI 26.15 kg/m2  SpO2 96%      Review of Systems  Constitutional: Negative.   HENT: Negative for congestion, dental problem, hearing loss, rhinorrhea, sinus pressure, sore throat and tinnitus.   Eyes: Negative for pain, discharge and visual disturbance.  Respiratory: Negative for cough and shortness of breath.   Cardiovascular: Negative for chest pain, palpitations and leg swelling.  Gastrointestinal: Negative for nausea, vomiting, abdominal pain, diarrhea, constipation, blood in stool and abdominal distention.  Genitourinary: Negative for dysuria, urgency, frequency, hematuria, flank pain, vaginal bleeding,  vaginal discharge, difficulty urinating, vaginal pain and pelvic pain.  Musculoskeletal: Negative for arthralgias, gait problem and joint swelling.  Skin: Negative for rash.  Neurological: Negative for dizziness, syncope, speech difficulty, weakness, numbness and headaches.  Hematological: Negative for adenopathy.  Psychiatric/Behavioral: Negative for behavioral problems, dysphoric mood and agitation. The patient is not nervous/anxious.        Objective:   Physical Exam  Constitutional: She is oriented to person, place, and time. She appears well-developed and well-nourished.  HENT:  Head: Normocephalic.  Right Ear: External ear normal.  Left Ear: External ear normal.  Mouth/Throat:  Oropharynx is clear and moist.  Eyes: Conjunctivae and EOM are normal. Pupils are equal, round, and reactive to light.  Neck: Normal range of motion. Neck supple. No thyromegaly present.  Cardiovascular: Normal rate, regular rhythm, normal heart sounds and intact distal pulses.   Pulmonary/Chest: Effort normal and breath sounds normal. No respiratory distress. She has no wheezes. She has no rales.  Abdominal: Soft. Bowel sounds are normal. She exhibits no mass. There is no tenderness.  Musculoskeletal: Normal range of motion.  Lymphadenopathy:    She has no cervical adenopathy.  Neurological: She is alert and oriented to person, place, and time.  Skin: Skin is warm and dry. No rash noted.  Superficial blister, right anterior mid lower leg  Psychiatric: She has a normal mood and affect. Her behavior is normal.          Assessment & Plan:  COPD.  Clinically stable.  We'll continue maintenance medications.  Medications refilled Coronary artery disease, stable Dyslipidemia.  Continue statin therapy.  We'll check a lipid profile Hypothyroidism.  We'll check a TSH  Medicines are reviewed and updated CPX in 6 months

## 2014-06-17 NOTE — Progress Notes (Signed)
Pre visit review using our clinic review tool, if applicable. No additional management support is needed unless otherwise documented below in the visit note. 

## 2014-06-18 LAB — LIPID PANEL
Cholesterol: 140 mg/dL (ref 0–200)
HDL: 62.4 mg/dL (ref >39.00)
LDL Cholesterol: 62 mg/dL (ref 0–99)
NonHDL: 77.6
Total CHOL/HDL Ratio: 2
Triglycerides: 79 mg/dL (ref 0.0–149.0)
VLDL: 15.8 mg/dL (ref 0.0–40.0)

## 2014-06-18 LAB — COMPREHENSIVE METABOLIC PANEL
ALT: 13 U/L (ref 0–35)
AST: 29 U/L (ref 0–37)
Albumin: 3.6 g/dL (ref 3.5–5.2)
Alkaline Phosphatase: 87 U/L (ref 39–117)
BUN: 17 mg/dL (ref 6–23)
CO2: 29 mEq/L (ref 19–32)
Calcium: 9 mg/dL (ref 8.4–10.5)
Chloride: 105 mEq/L (ref 96–112)
Creatinine, Ser: 1 mg/dL (ref 0.4–1.2)
GFR: 57.4 mL/min — ABNORMAL LOW (ref >60.00)
Glucose, Bld: 89 mg/dL (ref 70–99)
Potassium: 4.4 mEq/L (ref 3.5–5.1)
Sodium: 140 mEq/L (ref 135–145)
Total Bilirubin: 1 mg/dL (ref 0.2–1.2)
Total Protein: 6.5 g/dL (ref 6.0–8.3)

## 2014-06-18 LAB — TSH: TSH: 4.55 u[IU]/mL — ABNORMAL HIGH (ref 0.35–4.50)

## 2014-07-30 DIAGNOSIS — H3530 Unspecified macular degeneration: Secondary | ICD-10-CM | POA: Diagnosis not present

## 2014-07-30 DIAGNOSIS — H35059 Retinal neovascularization, unspecified, unspecified eye: Secondary | ICD-10-CM | POA: Diagnosis not present

## 2014-07-31 ENCOUNTER — Encounter: Payer: Self-pay | Admitting: Gastroenterology

## 2014-09-19 ENCOUNTER — Ambulatory Visit (INDEPENDENT_AMBULATORY_CARE_PROVIDER_SITE_OTHER): Payer: Medicare Other | Admitting: *Deleted

## 2014-09-19 DIAGNOSIS — Z23 Encounter for immunization: Secondary | ICD-10-CM

## 2014-10-31 ENCOUNTER — Other Ambulatory Visit: Payer: Self-pay | Admitting: Internal Medicine

## 2014-12-06 ENCOUNTER — Other Ambulatory Visit: Payer: Self-pay | Admitting: Internal Medicine

## 2014-12-10 DIAGNOSIS — H35053 Retinal neovascularization, unspecified, bilateral: Secondary | ICD-10-CM | POA: Diagnosis not present

## 2014-12-10 DIAGNOSIS — H3532 Exudative age-related macular degeneration: Secondary | ICD-10-CM | POA: Diagnosis not present

## 2014-12-18 ENCOUNTER — Encounter: Payer: Self-pay | Admitting: Internal Medicine

## 2014-12-18 ENCOUNTER — Ambulatory Visit (INDEPENDENT_AMBULATORY_CARE_PROVIDER_SITE_OTHER): Payer: Medicare Other | Admitting: Internal Medicine

## 2014-12-18 VITALS — BP 118/70 | HR 67 | Temp 97.6°F | Resp 18 | Ht 61.5 in | Wt 132.0 lb

## 2014-12-18 DIAGNOSIS — K219 Gastro-esophageal reflux disease without esophagitis: Secondary | ICD-10-CM | POA: Diagnosis not present

## 2014-12-18 DIAGNOSIS — I2510 Atherosclerotic heart disease of native coronary artery without angina pectoris: Secondary | ICD-10-CM

## 2014-12-18 DIAGNOSIS — E039 Hypothyroidism, unspecified: Secondary | ICD-10-CM | POA: Diagnosis not present

## 2014-12-18 DIAGNOSIS — Z0000 Encounter for general adult medical examination without abnormal findings: Secondary | ICD-10-CM | POA: Diagnosis not present

## 2014-12-18 DIAGNOSIS — E785 Hyperlipidemia, unspecified: Secondary | ICD-10-CM

## 2014-12-18 NOTE — Patient Instructions (Signed)
Limit your sodium (Salt) intake    It is important that you exercise regularly, at least 20 minutes 3 to 4 times per week.  If you develop chest pain or shortness of breath seek  medical attention.  Take a calcium supplement, plus [phone] units of vitamin D  Health Maintenance Adopting a healthy lifestyle and getting preventive care can go a long way to promote health and wellness. Talk with your health care provider about what schedule of regular examinations is right for you. This is a good chance for you to check in with your provider about disease prevention and staying healthy. In between checkups, there are plenty of things you can do on your own. Experts have done a lot of research about which lifestyle changes and preventive measures are most likely to keep you healthy. Ask your health care provider for more information. WEIGHT AND DIET  Eat a healthy diet  Be sure to include plenty of vegetables, fruits, low-fat dairy products, and lean protein.  Do not eat a lot of foods high in solid fats, added sugars, or salt.  Get regular exercise. This is one of the most important things you can do for your health.  Most adults should exercise for at least 150 minutes each week. The exercise should increase your heart rate and make you sweat (moderate-intensity exercise).  Most adults should also do strengthening exercises at least twice a week. This is in addition to the moderate-intensity exercise.  Maintain a healthy weight  Body mass index (BMI) is a measurement that can be used to identify possible weight problems. It estimates body fat based on height and weight. Your health care provider can help determine your BMI and help you achieve or maintain a healthy weight.  For females 28 years of age and older:   A BMI below 18.5 is considered underweight.  A BMI of 18.5 to 24.9 is normal.  A BMI of 25 to 29.9 is considered overweight.  A BMI of 30 and above is considered obese.   Watch levels of cholesterol and blood lipids  You should start having your blood tested for lipids and cholesterol at 79 years of age, then have this test every 5 years.  You may need to have your cholesterol levels checked more often if:  Your lipid or cholesterol levels are high.  You are older than 79 years of age.  You are at high risk for heart disease.  CANCER SCREENING   Lung Cancer  Lung cancer screening is recommended for adults 50-90 years old who are at high risk for lung cancer because of a history of smoking.  A yearly low-dose CT scan of the lungs is recommended for people who:  Currently smoke.  Have quit within the past 15 years.  Have at least a 30-pack-year history of smoking. A pack year is smoking an average of one pack of cigarettes a day for 1 year.  Yearly screening should continue until it has been 15 years since you quit.  Yearly screening should stop if you develop a health problem that would prevent you from having lung cancer treatment.  Breast Cancer  Practice breast self-awareness. This means understanding how your breasts normally appear and feel.  It also means doing regular breast self-exams. Let your health care provider know about any changes, no matter how small.  If you are in your 20s or 30s, you should have a clinical breast exam (CBE) by a health care provider every 1-3 years  as part of a regular health exam.  If you are 32 or older, have a CBE every year. Also consider having a breast X-ray (mammogram) every year.  If you have a family history of breast cancer, talk to your health care provider about genetic screening.  If you are at high risk for breast cancer, talk to your health care provider about having an MRI and a mammogram every year.  Breast cancer gene (BRCA) assessment is recommended for women who have family members with BRCA-related cancers. BRCA-related cancers  include:  Breast.  Ovarian.  Tubal.  Peritoneal cancers.  Results of the assessment will determine the need for genetic counseling and BRCA1 and BRCA2 testing. Cervical Cancer Routine pelvic examinations to screen for cervical cancer are no longer recommended for nonpregnant women who are considered low risk for cancer of the pelvic organs (ovaries, uterus, and vagina) and who do not have symptoms. A pelvic examination may be necessary if you have symptoms including those associated with pelvic infections. Ask your health care provider if a screening pelvic exam is right for you.   The Pap test is the screening test for cervical cancer for women who are considered at risk.  If you had a hysterectomy for a problem that was not cancer or a condition that could lead to cancer, then you no longer need Pap tests.  If you are older than 65 years, and you have had normal Pap tests for the past 10 years, you no longer need to have Pap tests.  If you have had past treatment for cervical cancer or a condition that could lead to cancer, you need Pap tests and screening for cancer for at least 20 years after your treatment.  If you no longer get a Pap test, assess your risk factors if they change (such as having a new sexual partner). This can affect whether you should start being screened again.  Some women have medical problems that increase their chance of getting cervical cancer. If this is the case for you, your health care provider may recommend more frequent screening and Pap tests.  The human papillomavirus (HPV) test is another test that may be used for cervical cancer screening. The HPV test looks for the virus that can cause cell changes in the cervix. The cells collected during the Pap test can be tested for HPV.  The HPV test can be used to screen women 28 years of age and older. Getting tested for HPV can extend the interval between normal Pap tests from three to five years.  An HPV  test also should be used to screen women of any age who have unclear Pap test results.  After 79 years of age, women should have HPV testing as often as Pap tests.  Colorectal Cancer  This type of cancer can be detected and often prevented.  Routine colorectal cancer screening usually begins at 79 years of age and continues through 79 years of age.  Your health care provider may recommend screening at an earlier age if you have risk factors for colon cancer.  Your health care provider may also recommend using home test kits to check for hidden blood in the stool.  A small camera at the end of a tube can be used to examine your colon directly (sigmoidoscopy or colonoscopy). This is done to check for the earliest forms of colorectal cancer.  Routine screening usually begins at age 60.  Direct examination of the colon should be repeated every  5-10 years through 79 years of age. However, you may need to be screened more often if early forms of precancerous polyps or small growths are found. Skin Cancer  Check your skin from head to toe regularly.  Tell your health care provider about any new moles or changes in moles, especially if there is a change in a mole's shape or color.  Also tell your health care provider if you have a mole that is larger than the size of a pencil eraser.  Always use sunscreen. Apply sunscreen liberally and repeatedly throughout the day.  Protect yourself by wearing long sleeves, pants, a wide-brimmed hat, and sunglasses whenever you are outside. HEART DISEASE, DIABETES, AND HIGH BLOOD PRESSURE   Have your blood pressure checked at least every 1-2 years. High blood pressure causes heart disease and increases the risk of stroke.  If you are between 2 years and 56 years old, ask your health care provider if you should take aspirin to prevent strokes.  Have regular diabetes screenings. This involves taking a blood sample to check your fasting blood sugar  level.  If you are at a normal weight and have a low risk for diabetes, have this test once every three years after 79 years of age.  If you are overweight and have a high risk for diabetes, consider being tested at a younger age or more often. PREVENTING INFECTION  Hepatitis B  If you have a higher risk for hepatitis B, you should be screened for this virus. You are considered at high risk for hepatitis B if:  You were born in a country where hepatitis B is common. Ask your health care provider which countries are considered high risk.  Your parents were born in a high-risk country, and you have not been immunized against hepatitis B (hepatitis B vaccine).  You have HIV or AIDS.  You use needles to inject street drugs.  You live with someone who has hepatitis B.  You have had sex with someone who has hepatitis B.  You get hemodialysis treatment.  You take certain medicines for conditions, including cancer, organ transplantation, and autoimmune conditions. Hepatitis C  Blood testing is recommended for:  Everyone born from [birth year] through 1963.  Anyone with known risk factors for hepatitis C. Sexually transmitted infections (STIs)  You should be screened for sexually transmitted infections (STIs) including gonorrhea and chlamydia if:  You are sexually active and are younger than 79 years of age.  You are older than 79 years of age and your health care provider tells you that you are at risk for this type of infection.  Your sexual activity has changed since you were last screened and you are at an increased risk for chlamydia or gonorrhea. Ask your health care provider if you are at risk.  If you do not have HIV, but are at risk, it may be recommended that you take a prescription medicine daily to prevent HIV infection. This is called pre-exposure prophylaxis (PrEP). You are considered at risk if:  You are sexually active and do not regularly use condoms or know the HIV status  of your partner(s).  You take drugs by injection.  You are sexually active with a partner who has HIV. Talk with your health care provider about whether you are at high risk of being infected with HIV. If you choose to begin PrEP, you should first be tested for HIV. You should then be tested every 3 months for as long as you  are taking PrEP.  PREGNANCY   If you are premenopausal and you may become pregnant, ask your health care provider about preconception counseling.  If you may become pregnant, take 400 to 800 micrograms (mcg) of folic acid every day.  If you want to prevent pregnancy, talk to your health care provider about birth control (contraception). OSTEOPOROSIS AND MENOPAUSE   Osteoporosis is a disease in which the bones lose minerals and strength with aging. This can result in serious bone fractures. Your risk for osteoporosis can be identified using a bone density scan.  If you are 74 years of age or older, or if you are at risk for osteoporosis and fractures, ask your health care provider if you should be screened.  Ask your health care provider whether you should take a calcium or vitamin D supplement to lower your risk for osteoporosis.  Menopause may have certain physical symptoms and risks.  Hormone replacement therapy may reduce some of these symptoms and risks. Talk to your health care provider about whether hormone replacement therapy is right for you.  HOME CARE INSTRUCTIONS   Schedule regular health, dental, and eye exams.  Stay current with your immunizations.   Do not use any tobacco products including cigarettes, chewing tobacco, or electronic cigarettes.  If you are pregnant, do not drink alcohol.  If you are breastfeeding, limit how much and how often you drink alcohol.  Limit alcohol intake to no more than 1 drink per day for nonpregnant women. One drink equals 12 ounces of beer, 5 ounces of wine, or 1 ounces of hard liquor.  Do not use street  drugs.  Do not share needles.  Ask your health care provider for help if you need support or information about quitting drugs.  Tell your health care provider if you often feel depressed.  Tell your health care provider if you have ever been abused or do not feel safe at home. Document Released: 05/31/2011 Document Revised: 04/01/2014 Document Reviewed: 10/17/2013 [facility] Patient Information 2015 [place], [place]. This information is not intended to replace advice given to you by your health care provider. Make sure you discuss any questions you have with your health care provider.

## 2014-12-18 NOTE — Progress Notes (Signed)
Pre visit review using our clinic review tool, if applicable. No additional management support is needed unless otherwise documented below in the visit note. 

## 2014-12-18 NOTE — Progress Notes (Signed)
Patient ID: Holly [name], female   DOB: 1928/02/02, 81 y.o.   MRN: [id number]  Subjective:    Patient ID: [name], female    DOB: 11-03-1928, 81 y.o.   MRN: [id number]  HPI 81  year-old patient who is in today for a preventive health examination.  Medical problems include coronary artery disease as well as COPD. She has been quite stable. She has treated hypothyroidism and also a history of dyslipidemia.  She remains on maintenance Advair; she   continues to do well from a pulmonary standpoint. No significant albuterol use.  Lab reviewed from July 2015  Lost her husband in October 2015  Wt Readings from Last 3 Encounters:  12/18/14 132 lb (59.875 kg)  06/17/14 143 lb (64.864 kg)  01/01/14 145 lb (65.772 kg)    1. Risk factors, based on past  M,S,F history- patient has known coronary artery disease cardiovascular risk factors include dyslipidemia and remote tobacco use  2.  Physical activities: Pulmonary status is stable no significant exercise limitations  3.  Depression/mood: No history of depression or mood disorder  4.  Hearing: No significant deficits  5.  ADL's: Independent in all aspects of daily living  6.  Fall risk: Low 7.  Home safety: No problems identified  8.  Height weight, and visual acuity; height and weight stable. Has a history of macular degeneration and is followed by ophthalmology closely  9.  Counseling: Regular exercise low salt diet all encouraged  10. Lab orders based on risk factors: Laboratory profile including lipid panel and TSH will be reviewed  11. Referral : Followup ophthalmology  12. Care plan: Followup ophthalmology recheck here in 6 months for flu vaccine will be administered at that time  13. Cognitive assessment: Alert and appropriate with normal affect. No cognitive dysfunction.  14.  Preventive services will include annual primary care health assessment with screening lab.  Annual ophthalmology visits.  Encouraged.  Patient was  provided with a written and personalized care plan  15.  Provider list update includes primary care medicine ophthalmology     Allergies:  1) Levaquin (Levofloxacin)  2) Sulfamethoxazole (Sulfamethoxazole)   Past History:   COPD  Hyperlipidemia  Hypothyroidism  Urosepsis 2004  macular degeneration  Coronary artery disease   Past Surgical History:   D&C  Vein stripping  G4 P4 A0  sigmoidoscopy 2001  s/p cataract OD  colonoscopy 2009   Family History:   father died age 46  mother died 66 gynecologic cancer  4 brothers two sisters  positive for colon cancer, coronary artery disease, scleroderma   Social History:   Married  d/c tobacco 1987    Review of Systems  Constitutional: Negative.   HENT: Negative for congestion, dental problem, hearing loss, rhinorrhea, sinus pressure, sore throat and tinnitus.   Eyes: Negative for pain, discharge and visual disturbance.  Respiratory: Positive for cough. Negative for shortness of breath.   Cardiovascular: Negative for chest pain, palpitations and leg swelling.  Gastrointestinal: Negative for nausea, vomiting, abdominal pain, diarrhea, constipation, blood in stool and abdominal distention.  Genitourinary: Negative for dysuria, urgency, frequency, hematuria, flank pain, vaginal bleeding, vaginal discharge, difficulty urinating, vaginal pain and pelvic pain.  Musculoskeletal: Negative for joint swelling, arthralgias and gait problem.  Skin: Negative for rash.  Neurological: Negative for dizziness, syncope, speech difficulty, weakness, numbness and headaches.  Hematological: Negative for adenopathy.  Psychiatric/Behavioral: Negative for behavioral problems, dysphoric mood and agitation. The patient is not nervous/anxious.  Objective:   Physical Exam  Constitutional: She is oriented to person, place, and time. She appears well-developed and well-nourished.  HENT:  Head: Normocephalic and atraumatic.  Right Ear:  External ear normal.  Left Ear: External ear normal.  Mouth/Throat: Oropharynx is clear and moist.  Eyes: Conjunctivae and EOM are normal.  Neck: Normal range of motion. Neck supple. No JVD present. No thyromegaly present.  Cardiovascular: Normal rate, regular rhythm, normal heart sounds and intact distal pulses.   No murmur heard. Pedal pulses full  Pulmonary/Chest: Effort normal and breath sounds normal. She has no wheezes. She has no rales.  Kyphosis  O2 saturation 96%  Abdominal: Soft. Bowel sounds are normal. She exhibits no distension and no mass. There is no tenderness. There is no rebound and no guarding.  Musculoskeletal: Normal range of motion. She exhibits no edema or tenderness.  Neurological: She is alert and oriented to person, place, and time. She has normal reflexes. No cranial nerve deficit. She exhibits normal muscle tone. Coordination normal.  Skin: Skin is warm and dry. No rash noted.  Psychiatric: She has a normal mood and affect. Her behavior is normal.          Assessment & Plan:   Preventive health examination Hypothyroidiswill continue levothyroxine supplementation Dyslipidemia.  Continue statin therapy   COPD stable we'll continue present inhalational medications.

## 2014-12-23 DIAGNOSIS — Z23 Encounter for immunization: Secondary | ICD-10-CM | POA: Diagnosis not present

## 2015-03-16 ENCOUNTER — Other Ambulatory Visit: Payer: Self-pay | Admitting: Internal Medicine

## 2015-03-18 DIAGNOSIS — H3530 Unspecified macular degeneration: Secondary | ICD-10-CM | POA: Diagnosis not present

## 2015-03-18 DIAGNOSIS — H35053 Retinal neovascularization, unspecified, bilateral: Secondary | ICD-10-CM | POA: Diagnosis not present

## 2015-06-14 ENCOUNTER — Other Ambulatory Visit: Payer: Self-pay | Admitting: Internal Medicine

## 2015-07-20 ENCOUNTER — Other Ambulatory Visit: Payer: Self-pay | Admitting: Internal Medicine

## 2015-07-29 DIAGNOSIS — Z961 Presence of intraocular lens: Secondary | ICD-10-CM | POA: Diagnosis not present

## 2015-07-29 DIAGNOSIS — H43813 Vitreous degeneration, bilateral: Secondary | ICD-10-CM | POA: Diagnosis not present

## 2015-07-29 DIAGNOSIS — H3532 Exudative age-related macular degeneration: Secondary | ICD-10-CM | POA: Diagnosis not present

## 2015-09-26 ENCOUNTER — Ambulatory Visit (INDEPENDENT_AMBULATORY_CARE_PROVIDER_SITE_OTHER): Payer: Medicare Other | Admitting: *Deleted

## 2015-09-26 DIAGNOSIS — Z23 Encounter for immunization: Secondary | ICD-10-CM

## 2015-12-02 DIAGNOSIS — H353230 Exudative age-related macular degeneration, bilateral, stage unspecified: Secondary | ICD-10-CM | POA: Diagnosis not present

## 2015-12-02 DIAGNOSIS — Z961 Presence of intraocular lens: Secondary | ICD-10-CM | POA: Diagnosis not present

## 2015-12-02 DIAGNOSIS — H43813 Vitreous degeneration, bilateral: Secondary | ICD-10-CM | POA: Diagnosis not present

## 2015-12-23 ENCOUNTER — Encounter: Payer: Self-pay | Admitting: Internal Medicine

## 2015-12-23 ENCOUNTER — Ambulatory Visit (INDEPENDENT_AMBULATORY_CARE_PROVIDER_SITE_OTHER)
Admission: RE | Admit: 2015-12-23 | Discharge: 2015-12-23 | Disposition: A | Discharge status: Home / Self Care | Payer: Medicare Other | Source: Ambulatory Visit | Attending: Internal Medicine | Admitting: Internal Medicine

## 2015-12-23 ENCOUNTER — Ambulatory Visit (INDEPENDENT_AMBULATORY_CARE_PROVIDER_SITE_OTHER): Payer: Medicare Other | Admitting: Internal Medicine

## 2015-12-23 VITALS — BP 122/80 | HR 71 | Temp 97.7°F | Resp 18 | Ht 61.0 in | Wt 125.0 lb

## 2015-12-23 DIAGNOSIS — Z0000 Encounter for general adult medical examination without abnormal findings: Secondary | ICD-10-CM | POA: Diagnosis not present

## 2015-12-23 DIAGNOSIS — E785 Hyperlipidemia, unspecified: Secondary | ICD-10-CM | POA: Diagnosis not present

## 2015-12-23 DIAGNOSIS — R634 Abnormal weight loss: Secondary | ICD-10-CM | POA: Diagnosis not present

## 2015-12-23 DIAGNOSIS — E039 Hypothyroidism, unspecified: Secondary | ICD-10-CM | POA: Diagnosis not present

## 2015-12-23 DIAGNOSIS — J449 Chronic obstructive pulmonary disease, unspecified: Secondary | ICD-10-CM | POA: Diagnosis not present

## 2015-12-23 DIAGNOSIS — I2510 Atherosclerotic heart disease of native coronary artery without angina pectoris: Secondary | ICD-10-CM

## 2015-12-23 LAB — CBC WITH DIFFERENTIAL/PLATELET
Basophils Absolute: 0 10*3/uL (ref 0.0–0.1)
Basophils Relative: 0.6 % (ref 0.0–3.0)
Eosinophils Absolute: 0.2 10*3/uL (ref 0.0–0.7)
Eosinophils Relative: 3.5 % (ref 0.0–5.0)
HCT: 42.7 % (ref 36.0–46.0)
Hemoglobin: 13.8 g/dL (ref 12.0–15.0)
Lymphocytes Relative: 25.6 % (ref 12.0–46.0)
Lymphs Abs: 1.5 10*3/uL (ref 0.7–4.0)
MCHC: 32.2 g/dL (ref 30.0–36.0)
MCV: 94 fl (ref 78.0–100.0)
Monocytes Absolute: 0.6 10*3/uL (ref 0.1–1.0)
Monocytes Relative: 11 % (ref 3.0–12.0)
Neutro Abs: 3.5 10*3/uL (ref 1.4–7.7)
Neutrophils Relative %: 59.3 % (ref 43.0–77.0)
Platelets: 246 10*3/uL (ref 150.0–400.0)
RBC: 4.55 Mil/uL (ref 3.87–5.11)
RDW: 16.2 % — ABNORMAL HIGH (ref 11.5–15.5)
WBC: 5.8 10*3/uL (ref 4.0–10.5)

## 2015-12-23 LAB — COMPREHENSIVE METABOLIC PANEL
ALT: 8 U/L (ref 0–35)
AST: 19 U/L (ref 0–37)
Albumin: 4.1 g/dL (ref 3.5–5.2)
Alkaline Phosphatase: 79 U/L (ref 39–117)
BUN: 16 mg/dL (ref 6–23)
CO2: 29 mEq/L (ref 19–32)
Calcium: 9.6 mg/dL (ref 8.4–10.5)
Chloride: 104 mEq/L (ref 96–112)
Creatinine, Ser: 0.87 mg/dL (ref 0.40–1.20)
GFR: 65.62 mL/min (ref >60.00)
Glucose, Bld: 83 mg/dL (ref 70–99)
Potassium: 5.1 mEq/L (ref 3.5–5.1)
Sodium: 142 mEq/L (ref 135–145)
Total Bilirubin: 0.9 mg/dL (ref 0.2–1.2)
Total Protein: 7.2 g/dL (ref 6.0–8.3)

## 2015-12-23 LAB — TSH: TSH: 19.6 u[IU]/mL — ABNORMAL HIGH (ref 0.35–4.50)

## 2015-12-23 LAB — SEDIMENTATION RATE: Sed Rate: 21 mm/hr (ref 0–22)

## 2015-12-23 NOTE — Patient Instructions (Signed)
  Increase your caloric intake and attempt to maintain your weight    It is important that you exercise regularly, at least 20 minutes 3 to 4 times per week.  If you develop chest pain or shortness of breath seek  medical attention.  Return in one year for follow-up

## 2015-12-23 NOTE — Progress Notes (Signed)
Pre visit review using our clinic review tool, if applicable. No additional management support is needed unless otherwise documented below in the visit note. 

## 2015-12-23 NOTE — Progress Notes (Signed)
Patient ID: Holly [name], female   DOB: 06-10-1928, 82 y.o.   MRN: [id number]  Subjective:    Patient ID: Holly [name], female    DOB: [birth year], 82 y.o.   MRN: [id number]  HPI 82  year-old patient who is in today for a preventive health examination.  Medical problems include coronary artery disease as well as COPD. She has been quite stable. She has treated hypothyroidism and also a history of dyslipidemia.  She remains on maintenance Advair; she   continues to do well from a pulmonary standpoint. No significant albuterol use.  Lab reviewed from July 2015  Lost her husband in October 2015  Wt Readings from Last 3 Encounters:  12/23/15 125 lb (56.7 kg)  12/18/14 132 lb (59.875 kg)  06/17/14 143 lb (64.864 kg)    1. Risk factors, based on past  M,S,F history- patient has known coronary artery disease cardiovascular risk factors include dyslipidemia and remote tobacco use  2.  Physical activities: Pulmonary status is stable no significant exercise limitations  3.  Depression/mood: No history of depression or mood disorder  4.  Hearing: No significant deficits  5.  ADL's: Independent in all aspects of daily living  6.  Fall risk: Low 7.  Home safety: No problems identified  8.  Height weight, and visual acuity; height and weight stable. Has a history of macular degeneration and is followed by ophthalmology closely  9.  Counseling: Regular exercise low salt diet all encouraged  10. Lab orders based on risk factors: Laboratory profile including lipid panel and TSH will be reviewed  11. Referral : Followup ophthalmology  12. Care plan: Followup ophthalmology recheck here in 6 months for flu vaccine will be administered at that time  13. Cognitive assessment: Alert and appropriate with normal affect. No cognitive dysfunction.  14.  Preventive services will include annual primary care health assessment with screening lab.  Annual ophthalmology visits.  Encouraged.  Patient was  provided with a written and personalized care plan  15.  Provider list update includes primary care medicine ophthalmology     Allergies:  1) Levaquin (Levofloxacin)  2) Sulfamethoxazole (Sulfamethoxazole)   Past History:   COPD  Hyperlipidemia  Hypothyroidism  Urosepsis 2004  macular degeneration  Coronary artery disease   Past Surgical History:   D&C  Vein stripping  G4 P4 A0  sigmoidoscopy 2001  s/p cataract OD  colonoscopy 2009   Family History:   father died age 25  mother died 42 gynecologic cancer  4 brothers two sisters  positive for colon cancer, coronary artery disease, scleroderma   Social History:   Widow  d/c tobacco 1987    Review of Systems  Constitutional: Negative.   HENT: Negative for congestion, dental problem, hearing loss, rhinorrhea, sinus pressure, sore throat and tinnitus.   Eyes: Negative for pain, discharge and visual disturbance.  Respiratory: Positive for cough. Negative for shortness of breath.   Cardiovascular: Negative for chest pain, palpitations and leg swelling.  Gastrointestinal: Negative for nausea, vomiting, abdominal pain, diarrhea, constipation, blood in stool and abdominal distention.  Genitourinary: Negative for dysuria, urgency, frequency, hematuria, flank pain, vaginal bleeding, vaginal discharge, difficulty urinating, vaginal pain and pelvic pain.  Musculoskeletal: Negative for joint swelling, arthralgias and gait problem.  Skin: Negative for rash.  Neurological: Negative for dizziness, syncope, speech difficulty, weakness, numbness and headaches.  Hematological: Negative for adenopathy.  Psychiatric/Behavioral: Negative for behavioral problems, dysphoric mood and agitation. The patient is not nervous/anxious.  Objective:   Physical Exam  Constitutional: She is oriented to person, place, and time. She appears well-developed and well-nourished.  HENT:  Head: Normocephalic and atraumatic.  Right Ear:  External ear normal.  Left Ear: External ear normal.  Mouth/Throat: Oropharynx is clear and moist.  Eyes: Conjunctivae and EOM are normal.  Neck: Normal range of motion. Neck supple. No JVD present. No thyromegaly present.  Cardiovascular: Normal rate, regular rhythm, normal heart sounds and intact distal pulses.   No murmur heard. Pedal pulses full  Pulmonary/Chest: Effort normal and breath sounds normal. She has no wheezes. She has no rales.  Kyphosis  O2 saturation 97%  Abdominal: Soft. Bowel sounds are normal. She exhibits no distension and no mass. There is no tenderness. There is no rebound and no guarding.  Musculoskeletal: Normal range of motion. She exhibits no edema or tenderness.  Neurological: She is alert and oriented to person, place, and time. She has normal reflexes. No cranial nerve deficit. She exhibits normal muscle tone. Coordination normal.  Skin: Skin is warm and dry. No rash noted.  Psychiatric: She has a normal mood and affect. Her behavior is normal.          Assessment & Plan:   Preventive health examination Hypothyroidiswill continue levothyroxine supplementation Dyslipidemia.  Continue statin therapy   COPD stable we'll continue present inhalational medications.  Weight loss.  We'll review screening lab.  More liberal chloric intake.  Encouraged.  Will review a chest x-ray

## 2015-12-24 ENCOUNTER — Other Ambulatory Visit: Payer: Self-pay | Admitting: Internal Medicine

## 2015-12-24 DIAGNOSIS — E039 Hypothyroidism, unspecified: Secondary | ICD-10-CM

## 2015-12-28 ENCOUNTER — Other Ambulatory Visit: Payer: Self-pay | Admitting: Internal Medicine

## 2016-01-23 ENCOUNTER — Other Ambulatory Visit: Payer: Self-pay | Admitting: Internal Medicine

## 2016-02-03 ENCOUNTER — Other Ambulatory Visit: Payer: Self-pay | Admitting: *Deleted

## 2016-02-03 ENCOUNTER — Other Ambulatory Visit (INDEPENDENT_AMBULATORY_CARE_PROVIDER_SITE_OTHER): Payer: Medicare Other

## 2016-02-03 ENCOUNTER — Other Ambulatory Visit: Payer: Self-pay | Admitting: Internal Medicine

## 2016-02-03 DIAGNOSIS — E039 Hypothyroidism, unspecified: Secondary | ICD-10-CM

## 2016-02-03 LAB — TSH: TSH: 0.07 u[IU]/mL — ABNORMAL LOW (ref 0.35–4.50)

## 2016-02-03 MED ORDER — LEVOTHYROXINE SODIUM 100 MCG PO TABS: 100.0000 ug | ORAL_TABLET | Freq: Every day | ORAL | Status: DC

## 2016-02-14 ENCOUNTER — Other Ambulatory Visit: Payer: Self-pay | Admitting: Internal Medicine

## 2016-07-23 ENCOUNTER — Other Ambulatory Visit: Payer: Self-pay | Admitting: Internal Medicine

## 2016-08-04 ENCOUNTER — Telehealth: Payer: Self-pay | Admitting: Internal Medicine

## 2016-08-04 NOTE — Telephone Encounter (Signed)
Give to Dr.K on Friday to fill out for pt.

## 2016-08-04 NOTE — Telephone Encounter (Signed)
Pt would like an application for a handicap placard. Please call when ready

## 2016-08-06 NOTE — Telephone Encounter (Signed)
Left message on voicemail that Handicap parking form is ready for pickup, will be at the front desk.

## 2016-08-18 ENCOUNTER — Other Ambulatory Visit: Payer: Self-pay | Admitting: Internal Medicine

## 2016-08-31 ENCOUNTER — Ambulatory Visit (INDEPENDENT_AMBULATORY_CARE_PROVIDER_SITE_OTHER): Payer: Medicare Other | Admitting: *Deleted

## 2016-08-31 DIAGNOSIS — Z23 Encounter for immunization: Secondary | ICD-10-CM | POA: Diagnosis not present

## 2016-09-16 ENCOUNTER — Telehealth: Payer: Self-pay | Admitting: Internal Medicine

## 2016-09-16 NOTE — Telephone Encounter (Signed)
Pt states her daughter has moved and now her son is her primary care giver. Has to take her everywhere, due to her health issues.  Son, Janda Smoke, has to go to court on Monday. His lawyer advised him to get a letter from pt's pcp stating this information, that he is the only one available to assist pt. Pt states this letter will help him in this case. Son was in a car crash and the passenger was killed.

## 2016-09-17 ENCOUNTER — Telehealth: Payer: Self-pay | Admitting: *Deleted

## 2016-09-17 ENCOUNTER — Encounter: Payer: Self-pay | Admitting: Internal Medicine

## 2016-09-17 ENCOUNTER — Ambulatory Visit (INDEPENDENT_AMBULATORY_CARE_PROVIDER_SITE_OTHER): Payer: Medicare Other | Admitting: Internal Medicine

## 2016-09-17 VITALS — BP 98/60 | HR 70 | Temp 98.2°F | Resp 18 | Ht 61.0 in | Wt 115.0 lb

## 2016-09-17 DIAGNOSIS — J441 Chronic obstructive pulmonary disease with (acute) exacerbation: Secondary | ICD-10-CM

## 2016-09-17 DIAGNOSIS — E785 Hyperlipidemia, unspecified: Secondary | ICD-10-CM

## 2016-09-17 DIAGNOSIS — I2510 Atherosclerotic heart disease of native coronary artery without angina pectoris: Secondary | ICD-10-CM | POA: Diagnosis not present

## 2016-09-17 DIAGNOSIS — S8011XA Contusion of right lower leg, initial encounter: Secondary | ICD-10-CM

## 2016-09-17 NOTE — Telephone Encounter (Signed)
Left message on voicemail to call office forgot to give her letter. I asked Dr.K and he said he gave her two copies.

## 2016-09-17 NOTE — Progress Notes (Signed)
Subjective:    Patient ID: Holly [name], female    DOB: [birth year], 82 y.o.   MRN: [id number]  HPI  82 year old patient who has a history of COPD. She traumatized her right lower anterior leg about 80 days ago.  She has some residual pain but no further drainage.  The leg seems to be improving.  She has a history of COPD and hypothyroidism.  Her pulmonary status has been stable.  Remains on atorvastatin for dyslipidemia.   Past Medical History:  Diagnosis Date  . CAD (coronary artery disease)   . CELLULITIS 06/18/2008  . CHF (congestive heart failure) (HCC)   . CHRONIC OBSTRUCTIVE PULMONARY DISEASE, ACUTE EXACERBATION 11/16/2007  . COPD 05/18/2007  . GERD 12/25/2007  . HYPERLIPIDEMIA 05/18/2007  . HYPOTHYROIDISM 05/18/2007  . Macular degeneration   . Urosepsis      Social History   Social History  . Marital status: Married    Spouse name: N/A  . Number of children: N/A  . Years of education: N/A   Occupational History  . Not on file.   Social History Main Topics  . Smoking status: Former Smoker    Quit date: 11/29/1985  . Smokeless tobacco: Never Used  . Alcohol use No  . Drug use: No  . Sexual activity: Not on file   Other Topics Concern  . Not on file   Social History Narrative  . No narrative on file    Past Surgical History:  Procedure Laterality Date  . CATARACT EXTRACTION    . DILATION AND CURETTAGE OF UTERUS    . VARICOSE VEIN SURGERY      No family history on file.  Allergies  Allergen Reactions  . Levofloxacin     REACTION: unspecified  . Macrobid [Nitrofurantoin Macrocrystal] Nausea Only  . Sulfamethoxazole     REACTION: unspecified  . Tramadol Other (See Comments)    Shakes     Current Outpatient Prescriptions on File Prior to Visit  Medication Sig Dispense Refill  . ADVAIR DISKUS 500-50 MCG/DOSE AEPB INHALE 1 PUFF INTO THE LUNGS EVERY 12 HOURS. 180 each 1  . aspirin 81 MG tablet Take 81 mg by mouth daily.      Marland Kitchen atorvastatin (LIPITOR)  10 MG tablet TAKE 1 TABLET BY MOUTH EVERY DAY 90 tablet 1  . levothyroxine (SYNTHROID, LEVOTHROID) 100 MCG tablet Take 1 tablet (100 mcg total) by mouth daily. 90 tablet 3  . PROVENTIL HFA 108 (90 BASE) MCG/ACT inhaler INHALE 2 PUFFS EVERY 6 HOURS AS NEEDED 20.1 each 5   No current facility-administered medications on file prior to visit.     BP 98/60 (BP Location: Left Arm, Patient Position: Sitting, Cuff Size: Normal)   Pulse 70   Temp 98.2 F (36.8 C) (Oral)   Resp 18   Ht 5\' 1"  (1.549 m)   Wt 115 lb (52.2 kg)   SpO2 98%   BMI 21.73 kg/m      Review of Systems  Constitutional: Negative.   HENT: Negative for congestion, dental problem, hearing loss, rhinorrhea, sinus pressure, sore throat and tinnitus.   Eyes: Negative for pain, discharge and visual disturbance.  Respiratory: Negative for cough and shortness of breath.   Cardiovascular: Negative for chest pain, palpitations and leg swelling.  Gastrointestinal: Negative for abdominal distention, abdominal pain, blood in stool, constipation, diarrhea, nausea and vomiting.  Genitourinary: Negative for difficulty urinating, dysuria, flank pain, frequency, hematuria, pelvic pain, urgency, vaginal bleeding, vaginal discharge and vaginal pain.  Musculoskeletal: Negative for arthralgias, gait problem and joint swelling.  Skin: Positive for wound. Negative for rash.  Neurological: Negative for dizziness, syncope, speech difficulty, weakness, numbness and headaches.  Hematological: Negative for adenopathy.  Psychiatric/Behavioral: Negative for agitation, behavioral problems and dysphoric mood. The patient is not nervous/anxious.        Objective:   Physical Exam  Constitutional: She appears well-developed and well-nourished. No distress.  Cardiovascular: Normal rate and regular rhythm.   Pulmonary/Chest: Effort normal. She has no rales.  Kyphosis  Skin:  Ecchymoses and soft tissue swelling right anterior mid lower leg Superficial  healing laceration noted No drainage or fluctuance No excessive warmth          Assessment & Plan:  Resolving contusion, laceration, right anterior lower leg COPD Dyslipidemia  Local wound care discussed We'll report any clinical worsening  CPX in the spring as scheduled  Rogelia Boga

## 2016-09-17 NOTE — Progress Notes (Signed)
Pre visit review using our clinic review tool, if applicable. No additional management support is needed unless otherwise documented below in the visit note. 

## 2016-09-17 NOTE — Patient Instructions (Addendum)
Keep leg elevated as much as possible   Contusion A contusion is a deep bruise. Contusions happen when an injury causes bleeding under the skin. Symptoms of bruising include pain, swelling, and discolored skin. The skin may turn blue, purple, or yellow. HOME CARE   Rest the injured area.  If told, put ice on the injured area.  Put ice in a plastic bag.  Place a towel between your skin and the bag.  Leave the ice on for 20 minutes, 2-3 times per day.  If told, put light pressure (compression) on the injured area using an elastic bandage. Make sure the bandage is not too tight. Remove it and put it back on as told by your doctor.  If possible, raise (elevate) the injured area above the level of your heart while you are sitting or lying down.  Take over-the-counter and prescription medicines only as told by your doctor. GET HELP IF:  Your symptoms do not get better after several days of treatment.  Your symptoms get worse.  You have trouble moving the injured area. GET HELP RIGHT AWAY IF:   You have very bad pain.  You have a loss of feeling (numbness) in a hand or foot.  Your hand or foot turns pale or cold.   This information is not intended to replace advice given to you by your health care provider. Make sure you discuss any questions you have with your health care provider.   Document Released: 05/03/2008 Document Revised: 08/06/2015 Document Reviewed: 04/02/2015 Elsevier Interactive Patient Education [employer].

## 2016-10-18 DIAGNOSIS — Z6820 Body mass index (BMI) 20.0-20.9, adult: Secondary | ICD-10-CM | POA: Diagnosis not present

## 2016-10-18 DIAGNOSIS — J018 Other acute sinusitis: Secondary | ICD-10-CM | POA: Diagnosis not present

## 2016-10-18 DIAGNOSIS — J42 Unspecified chronic bronchitis: Secondary | ICD-10-CM | POA: Diagnosis not present

## 2016-10-18 DIAGNOSIS — J449 Chronic obstructive pulmonary disease, unspecified: Secondary | ICD-10-CM | POA: Diagnosis not present

## 2016-10-18 DIAGNOSIS — J208 Acute bronchitis due to other specified organisms: Secondary | ICD-10-CM | POA: Diagnosis not present

## 2016-11-16 DIAGNOSIS — H353230 Exudative age-related macular degeneration, bilateral, stage unspecified: Secondary | ICD-10-CM | POA: Diagnosis not present

## 2016-11-16 DIAGNOSIS — Z961 Presence of intraocular lens: Secondary | ICD-10-CM | POA: Diagnosis not present

## 2016-11-16 DIAGNOSIS — H43813 Vitreous degeneration, bilateral: Secondary | ICD-10-CM | POA: Diagnosis not present

## 2016-12-24 ENCOUNTER — Ambulatory Visit (INDEPENDENT_AMBULATORY_CARE_PROVIDER_SITE_OTHER): Payer: Medicare Other | Admitting: Internal Medicine

## 2016-12-24 ENCOUNTER — Encounter: Payer: Self-pay | Admitting: Internal Medicine

## 2016-12-24 VITALS — BP 122/76 | HR 93 | Temp 98.7°F | Ht 61.0 in | Wt 108.8 lb

## 2016-12-24 DIAGNOSIS — J449 Chronic obstructive pulmonary disease, unspecified: Secondary | ICD-10-CM

## 2016-12-24 DIAGNOSIS — E785 Hyperlipidemia, unspecified: Secondary | ICD-10-CM

## 2016-12-24 DIAGNOSIS — Z0000 Encounter for general adult medical examination without abnormal findings: Secondary | ICD-10-CM | POA: Diagnosis not present

## 2016-12-24 DIAGNOSIS — E039 Hypothyroidism, unspecified: Secondary | ICD-10-CM

## 2016-12-24 LAB — CBC WITH DIFFERENTIAL/PLATELET
Basophils Absolute: 0 10*3/uL (ref 0.0–0.1)
Basophils Relative: 0.5 % (ref 0.0–3.0)
Eosinophils Absolute: 0.3 10*3/uL (ref 0.0–0.7)
Eosinophils Relative: 4.9 % (ref 0.0–5.0)
HCT: 39 % (ref 36.0–46.0)
Hemoglobin: 13 g/dL (ref 12.0–15.0)
Lymphocytes Relative: 27.2 % (ref 12.0–46.0)
Lymphs Abs: 1.5 10*3/uL (ref 0.7–4.0)
MCHC: 33.3 g/dL (ref 30.0–36.0)
MCV: 90.7 fl (ref 78.0–100.0)
Monocytes Absolute: 0.6 10*3/uL (ref 0.1–1.0)
Monocytes Relative: 10.4 % (ref 3.0–12.0)
Neutro Abs: 3.1 10*3/uL (ref 1.4–7.7)
Neutrophils Relative %: 57 % (ref 43.0–77.0)
Platelets: 249 10*3/uL (ref 150.0–400.0)
RBC: 4.3 Mil/uL (ref 3.87–5.11)
RDW: 16.7 % — ABNORMAL HIGH (ref 11.5–15.5)
WBC: 5.5 10*3/uL (ref 4.0–10.5)

## 2016-12-24 LAB — LIPID PANEL
Cholesterol: 149 mg/dL (ref 0–200)
HDL: 61.1 mg/dL (ref >39.00)
LDL Cholesterol: 76 mg/dL (ref 0–99)
NonHDL: 87.9
Total CHOL/HDL Ratio: 2
Triglycerides: 59 mg/dL (ref 0.0–149.0)
VLDL: 11.8 mg/dL (ref 0.0–40.0)

## 2016-12-24 LAB — COMPREHENSIVE METABOLIC PANEL
ALT: 9 U/L (ref 0–35)
AST: 19 U/L (ref 0–37)
Albumin: 4 g/dL (ref 3.5–5.2)
Alkaline Phosphatase: 80 U/L (ref 39–117)
BUN: 19 mg/dL (ref 6–23)
CO2: 29 mEq/L (ref 19–32)
Calcium: 9.5 mg/dL (ref 8.4–10.5)
Chloride: 105 mEq/L (ref 96–112)
Creatinine, Ser: 0.81 mg/dL (ref 0.40–1.20)
GFR: 71.09 mL/min (ref >60.00)
Glucose, Bld: 87 mg/dL (ref 70–99)
Potassium: 4.8 mEq/L (ref 3.5–5.1)
Sodium: 142 mEq/L (ref 135–145)
Total Bilirubin: 1.2 mg/dL (ref 0.2–1.2)
Total Protein: 6.6 g/dL (ref 6.0–8.3)

## 2016-12-24 LAB — TSH: TSH: 0.08 u[IU]/mL — ABNORMAL LOW (ref 0.35–4.50)

## 2016-12-24 NOTE — Patient Instructions (Signed)
Limit your sodium (Salt) intake    It is important that you exercise regularly, at least 20 minutes 3 to 4 times per week.  If you develop chest pain or shortness of breath seek  medical attention.  Take a calcium supplement, plus 800-1200 units of vitamin D  Return in 6 months for follow-up  

## 2016-12-24 NOTE — Progress Notes (Signed)
Pre visit review using our clinic review tool, if applicable. No additional management support is needed unless otherwise documented below in the visit note. 

## 2016-12-24 NOTE — Progress Notes (Signed)
Subjective:    Patient ID: Holly [name], female    DOB: 1928/03/27, 83 y.o.   MRN: [id number]  HPI 83 year old patient who is seen today for follow-up and for an annual wellness Medicare visit.  She has a history of COPD and does reasonably well.  She required a physician visit in November when she was visiting in [place] for an exacerbation of COPD.  More recently she has done well She has hypothyroidism and a history of dyslipidemia.  No new concerns or complaints. Chest x-ray was performed in January 2017 Last colonoscopy 2009  Past Medical History:  Diagnosis Date  . CAD (coronary artery disease)   . CELLULITIS 06/18/2008  . CHF (congestive heart failure) (HCC)   . CHRONIC OBSTRUCTIVE PULMONARY DISEASE, ACUTE EXACERBATION 11/16/2007  . COPD 05/18/2007  . GERD 12/25/2007  . HYPERLIPIDEMIA 05/18/2007  . HYPOTHYROIDISM 05/18/2007  . Macular degeneration   . Urosepsis      Social History   Social History  . Marital status: Married    Spouse name: N/A  . Number of children: N/A  . Years of education: N/A   Occupational History  . Not on file.   Social History Main Topics  . Smoking status: Former Smoker    Quit date: 11/29/1985  . Smokeless tobacco: Never Used  . Alcohol use No  . Drug use: No  . Sexual activity: Not on file   Other Topics Concern  . Not on file   Social History Narrative  . No narrative on file    Past Surgical History:  Procedure Laterality Date  . CATARACT EXTRACTION    . DILATION AND CURETTAGE OF UTERUS    . VARICOSE VEIN SURGERY      No family history on file.  Allergies  Allergen Reactions  . Levofloxacin     REACTION: unspecified  . Macrobid [Nitrofurantoin Macrocrystal] Nausea Only  . Sulfamethoxazole     REACTION: unspecified  . Tramadol Other (See Comments)    Shakes     Current Outpatient Prescriptions on File Prior to Visit  Medication Sig Dispense Refill  . ADVAIR DISKUS 500-50 MCG/DOSE AEPB INHALE 1 PUFF INTO THE LUNGS  EVERY 12 HOURS. 180 each 1  . atorvastatin (LIPITOR) 10 MG tablet TAKE 1 TABLET BY MOUTH EVERY DAY 90 tablet 1  . levothyroxine (SYNTHROID, LEVOTHROID) 100 MCG tablet Take 1 tablet (100 mcg total) by mouth daily. 90 tablet 3  . PROVENTIL HFA 108 (90 BASE) MCG/ACT inhaler INHALE 2 PUFFS EVERY 6 HOURS AS NEEDED 20.1 each 5  . aspirin 81 MG tablet Take 81 mg by mouth daily.       No current facility-administered medications on file prior to visit.     BP 122/76 (BP Location: Left Arm, Patient Position: Sitting, Cuff Size: Normal)   Pulse 93   Temp 98.7 F (37.1 C) (Oral)   Ht 5\' 1"  (1.549 m)   Wt 108 lb 12.8 oz (49.4 kg)   SpO2 96%   BMI 20.56 kg/m   Medicare wellness visit  1. Risk factors, based on past  M,S,F history.  Cardiovascular risk factors include dyslipidemia  2.  Physical activities:fairly active for her age does have moderate COPD  3.  Depression/mood:no history of major depression or mood disorder  4.  Hearing:mild to moderate deficits  5.  ADL's:independent  6.  Fall risk:moderate due to age  73.  Home safety:o problems identified  8.  Height weight, and visual acuity;height and weight stable  no change in visual acuity  9.  Counseling:continue heart healthy diet  10. Lab orders based on risk factors:laboratory profile will be reviewed.  TSH reviewed  11. Referral :not appropriate at this time  12. Care plan:continue efforts at aggressive risk factor modification  13. Cognitive assessment: alert in order with normal affect no cognitive dysfunction  14. Screening: Patient provided with a written and personalized 5-10 year screening schedule in the AVS.    15. Provider List Update: primary care radiology and ophthalmology     Review of Systems  Constitutional: Negative.   HENT: Negative for congestion, dental problem, hearing loss, rhinorrhea, sinus pressure, sore throat and tinnitus.   Eyes: Negative for pain, discharge and visual disturbance.    Respiratory: Positive for shortness of breath. Negative for cough.   Cardiovascular: Negative for chest pain, palpitations and leg swelling.  Gastrointestinal: Negative for abdominal distention, abdominal pain, blood in stool, constipation, diarrhea, nausea and vomiting.  Genitourinary: Negative for difficulty urinating, dysuria, flank pain, frequency, hematuria, pelvic pain, urgency, vaginal bleeding, vaginal discharge and vaginal pain.  Musculoskeletal: Positive for back pain. Negative for arthralgias, gait problem and joint swelling.  Skin: Negative for rash.  Neurological: Negative for dizziness, syncope, speech difficulty, weakness, numbness and headaches.  Hematological: Negative for adenopathy.  Psychiatric/Behavioral: Negative for agitation, behavioral problems and dysphoric mood. The patient is not nervous/anxious.        Objective:   Physical Exam  Constitutional: She is oriented to person, place, and time. She appears well-developed and well-nourished.  Blood pressure low normal Elderly, frail no acute distress O2 sats ration 96% Weight 109  HENT:  Head: Normocephalic and atraumatic.  Right Ear: External ear normal.  Left Ear: External ear normal.  Mouth/Throat: Oropharynx is clear and moist.  Eyes: Conjunctivae and EOM are normal.  Neck: Normal range of motion. Neck supple. No JVD present. No thyromegaly present.  Cardiovascular: Normal rate, regular rhythm, normal heart sounds and intact distal pulses.   No murmur heard. Pedal pulses are full  Pulmonary/Chest: Effort normal and breath sounds normal. She has no wheezes. She has no rales.  kyphosis  Abdominal: Soft. Bowel sounds are normal. She exhibits no distension and no mass. There is no tenderness. There is no rebound and no guarding.  Genitourinary: Vagina normal.  Musculoskeletal: Normal range of motion. She exhibits no edema or tenderness.  Neurological: She is alert and oriented to person, place, and time. She  has normal reflexes. No cranial nerve deficit. She exhibits normal muscle tone. Coordination normal.  Skin: Skin is warm and dry. No rash noted.  Scattered ecchymoses  Psychiatric: She has a normal mood and affect. Her behavior is normal.          Assessment & Plan:    Dyslipidemia.  Continue statin therapy .  Hypothyroidism.  Continue levothyroxine.  Review TSH COPD continue [employer] Medicare wellness visit.  Patient will attempt to more activities.  We'll continue maintenance pulmonary medications  Rogelia Boga   Follow-up 6 months or as needed

## 2016-12-27 ENCOUNTER — Telehealth: Payer: Self-pay | Admitting: Internal Medicine

## 2016-12-27 MED ORDER — LEVOTHYROXINE SODIUM 50 MCG PO TABS: 50.0000 ug | ORAL_TABLET | Freq: Every day | ORAL | 3 refills | Status: DC

## 2016-12-27 NOTE — Telephone Encounter (Signed)
Spoke to pt about lab results.

## 2016-12-27 NOTE — Telephone Encounter (Signed)
Pt returning your call about labs

## 2016-12-27 NOTE — Addendum Note (Signed)
Addended by: Abelardo Diesel on: 12/27/2016 10:24 AM   Modules accepted: Orders

## 2016-12-27 NOTE — Telephone Encounter (Signed)
Pt's son called in stating they had received a VM to call our office back.  I do not see that on notes anywhere.  Pt can be reached at [phone].

## 2017-01-25 ENCOUNTER — Other Ambulatory Visit: Payer: Self-pay | Admitting: Internal Medicine

## 2017-01-31 ENCOUNTER — Encounter: Payer: Self-pay | Admitting: Internal Medicine

## 2017-01-31 ENCOUNTER — Ambulatory Visit (INDEPENDENT_AMBULATORY_CARE_PROVIDER_SITE_OTHER): Payer: Medicare Other | Admitting: Internal Medicine

## 2017-01-31 VITALS — BP 140/62 | HR 77 | Temp 97.8°F | Ht 61.0 in | Wt 115.0 lb

## 2017-01-31 DIAGNOSIS — E039 Hypothyroidism, unspecified: Secondary | ICD-10-CM | POA: Diagnosis not present

## 2017-01-31 DIAGNOSIS — J449 Chronic obstructive pulmonary disease, unspecified: Secondary | ICD-10-CM | POA: Diagnosis not present

## 2017-01-31 LAB — TSH: TSH: 7.66 u[IU]/mL — ABNORMAL HIGH (ref 0.35–4.50)

## 2017-01-31 NOTE — Patient Instructions (Signed)
Take a calcium supplement, plus 800-1200 units of vitamin D    It is important that you exercise regularly, at least 20 minutes 3 to 4 times per week.  If you develop chest pain or shortness of breath seek  medical attention.  Return in 6 months for follow-up  

## 2017-01-31 NOTE — Progress Notes (Signed)
Pre visit review using our clinic review tool, if applicable. No additional management support is needed unless otherwise documented below in the visit note. 

## 2017-01-31 NOTE — Progress Notes (Signed)
Subjective:    Patient ID: Holly [name], female    DOB: Dec 04, 1927, 83 y.o.   MRN: [id number]  HPI  83 year old patient who has hypothyroidism.  She was seen for her annual exam.  2 months ago, and TSH was suppressed.  Levothyroxin was decreased from 100 g to 50 g.  She feels well today.  States that her weight is up slightly  Wt Readings from Last 3 Encounters:  01/31/17 115 lb (52.2 kg)  12/24/16 108 lb 12.8 oz (49.4 kg)  09/17/16 115 lb (52.2 kg)   Past Medical History:  Diagnosis Date  . CAD (coronary artery disease)   . CELLULITIS 06/18/2008  . CHF (congestive heart failure) (HCC)   . CHRONIC OBSTRUCTIVE PULMONARY DISEASE, ACUTE EXACERBATION 11/16/2007  . COPD 05/18/2007  . GERD 12/25/2007  . HYPERLIPIDEMIA 05/18/2007  . HYPOTHYROIDISM 05/18/2007  . Macular degeneration   . Urosepsis      Social History   Social History  . Marital status: Married    Spouse name: N/A  . Number of children: N/A  . Years of education: N/A   Occupational History  . Not on file.   Social History Main Topics  . Smoking status: Former Smoker    Quit date: 11/29/1985  . Smokeless tobacco: Never Used  . Alcohol use No  . Drug use: No  . Sexual activity: Not on file   Other Topics Concern  . Not on file   Social History Narrative  . No narrative on file    Past Surgical History:  Procedure Laterality Date  . CATARACT EXTRACTION    . DILATION AND CURETTAGE OF UTERUS    . VARICOSE VEIN SURGERY      No family history on file.  Allergies  Allergen Reactions  . Levofloxacin     REACTION: unspecified  . Macrobid [Nitrofurantoin Macrocrystal] Nausea Only  . Sulfamethoxazole     REACTION: unspecified  . Tramadol Other (See Comments)    Shakes     Current Outpatient Prescriptions on File Prior to Visit  Medication Sig Dispense Refill  . ADVAIR DISKUS 500-50 MCG/DOSE AEPB INHALE 1 PUFF INTO THE LUNGS EVERY 12 HOURS. 180 each 1  . aspirin 81 MG tablet Take 81 mg by mouth  daily.      Marland Kitchen atorvastatin (LIPITOR) 10 MG tablet TAKE 1 TABLET BY MOUTH EVERY DAY 90 tablet 1  . levothyroxine (SYNTHROID, LEVOTHROID) 50 MCG tablet Take 1 tablet (50 mcg total) by mouth daily before breakfast. 90 tablet 3  . PROVENTIL HFA 108 (90 BASE) MCG/ACT inhaler INHALE 2 PUFFS EVERY 6 HOURS AS NEEDED 20.1 each 5   No current facility-administered medications on file prior to visit.     BP 140/62 (BP Location: Left Arm, Patient Position: Sitting, Cuff Size: Normal)   Pulse 77   Temp 97.8 F (36.6 C) (Oral)   Ht 5\' 1"  (1.549 m)   Wt 115 lb (52.2 kg)   SpO2 98%   BMI 21.73 kg/m     Review of Systems  Constitutional: Negative.   HENT: Negative for congestion, dental problem, hearing loss, rhinorrhea, sinus pressure, sore throat and tinnitus.   Eyes: Negative for pain, discharge and visual disturbance.  Respiratory: Negative for cough and shortness of breath.   Cardiovascular: Negative for chest pain, palpitations and leg swelling.  Gastrointestinal: Negative for abdominal distention, abdominal pain, blood in stool, constipation, diarrhea, nausea and vomiting.  Genitourinary: Negative for difficulty urinating, dysuria, flank pain, frequency, hematuria, pelvic  pain, urgency, vaginal bleeding, vaginal discharge and vaginal pain.  Musculoskeletal: Negative for arthralgias, gait problem and joint swelling.  Skin: Negative for rash.  Neurological: Negative for dizziness, syncope, speech difficulty, weakness, numbness and headaches.  Hematological: Negative for adenopathy.  Psychiatric/Behavioral: Negative for agitation, behavioral problems and dysphoric mood. The patient is not nervous/anxious.        Objective:   Physical Exam  Constitutional: She appears well-developed and well-nourished. No distress.  Cardiovascular: Normal rate and regular rhythm.   Pulmonary/Chest:   Breath sounds diminished Kyphosis  Musculoskeletal: She exhibits no edema.          Assessment &  Plan:   Hypothyroidism.  We'll recheck TSH.  Adjust supplement.  Further, if necessary Otherwise, return in 6 months for follow-up COPD stable  [email or web address],PETER Homero Fellers

## 2017-02-01 MED ORDER — LEVOTHYROXINE SODIUM 75 MCG PO TABS: 75.0000 ug | ORAL_TABLET | Freq: Every day | ORAL | 2 refills | Status: DC

## 2017-02-01 NOTE — Addendum Note (Signed)
Addended by: Abelardo Diesel on: 02/01/2017 01:36 PM   Modules accepted: Orders

## 2017-02-15 ENCOUNTER — Other Ambulatory Visit: Payer: Self-pay | Admitting: Internal Medicine

## 2017-05-10 ENCOUNTER — Encounter: Payer: Self-pay | Admitting: Internal Medicine

## 2017-05-10 ENCOUNTER — Ambulatory Visit (INDEPENDENT_AMBULATORY_CARE_PROVIDER_SITE_OTHER): Payer: Medicare Other | Admitting: Internal Medicine

## 2017-05-10 VITALS — BP 132/68 | HR 74 | Temp 97.5°F | Ht 61.0 in | Wt 113.8 lb

## 2017-05-10 DIAGNOSIS — E039 Hypothyroidism, unspecified: Secondary | ICD-10-CM | POA: Diagnosis not present

## 2017-05-10 DIAGNOSIS — I2510 Atherosclerotic heart disease of native coronary artery without angina pectoris: Secondary | ICD-10-CM

## 2017-05-10 DIAGNOSIS — J449 Chronic obstructive pulmonary disease, unspecified: Secondary | ICD-10-CM

## 2017-05-10 DIAGNOSIS — L989 Disorder of the skin and subcutaneous tissue, unspecified: Secondary | ICD-10-CM | POA: Diagnosis not present

## 2017-05-10 LAB — TSH: TSH: 7.8 u[IU]/mL — ABNORMAL HIGH (ref 0.35–4.50)

## 2017-05-10 NOTE — Patient Instructions (Signed)
Return in 6 months for follow-up  Limit your sodium (Salt) intake   

## 2017-05-10 NOTE — Progress Notes (Signed)
Subjective:    Patient ID: Holly [name], female    DOB: Sep 25, 1928, 83 y.o.   MRN: [id number]  HPI  83 year old patient seen today in follow-up.  She has a history of hypothyroidism.  Initially, her TSH was suppressed and following adjustment was slightly elevated.  Presently she is on a dose of 0.075 milligrams. Doing quite well.  Only complaint is some cramping involving the right calf. She has had a prior vein stripping procedure many years ago She has COPD which has been stable as well as dyslipidemia.  Patient requests a dermatology  referral  Past Medical History:  Diagnosis Date  . CAD (coronary artery disease)   . CELLULITIS 06/18/2008  . CHF (congestive heart failure) (HCC)   . CHRONIC OBSTRUCTIVE PULMONARY DISEASE, ACUTE EXACERBATION 11/16/2007  . COPD 05/18/2007  . GERD 12/25/2007  . HYPERLIPIDEMIA 05/18/2007  . HYPOTHYROIDISM 05/18/2007  . Macular degeneration   . Urosepsis      Social History   Social History  . Marital status: Married    Spouse name: N/A  . Number of children: N/A  . Years of education: N/A   Occupational History  . Not on file.   Social History Main Topics  . Smoking status: Former Smoker    Quit date: 11/29/1985  . Smokeless tobacco: Never Used  . Alcohol use No  . Drug use: No  . Sexual activity: Not on file   Other Topics Concern  . Not on file   Social History Narrative  . No narrative on file    Past Surgical History:  Procedure Laterality Date  . CATARACT EXTRACTION    . DILATION AND CURETTAGE OF UTERUS    . VARICOSE VEIN SURGERY      No family history on file.  Allergies  Allergen Reactions  . Levofloxacin     REACTION: unspecified  . Macrobid [Nitrofurantoin Macrocrystal] Nausea Only  . Sulfamethoxazole     REACTION: unspecified  . Tramadol Other (See Comments)    Shakes     Current Outpatient Prescriptions on File Prior to Visit  Medication Sig Dispense Refill  . ADVAIR DISKUS 500-50 MCG/DOSE AEPB INHALE 1  PUFF INTO THE LUNGS EVERY 12 HOURS. 180 each 1  . atorvastatin (LIPITOR) 10 MG tablet TAKE 1 TABLET BY MOUTH EVERY DAY 90 tablet 1  . levothyroxine (SYNTHROID, LEVOTHROID) 75 MCG tablet Take 1 tablet (75 mcg total) by mouth daily. 90 tablet 2  . PROVENTIL HFA 108 (90 BASE) MCG/ACT inhaler INHALE 2 PUFFS EVERY 6 HOURS AS NEEDED 20.1 each 5   No current facility-administered medications on file prior to visit.     BP 132/68 (BP Location: Left Arm, Patient Position: Sitting, Cuff Size: Normal)   Pulse 74   Temp 97.5 F (36.4 C) (Oral)   Ht 5\' 1"  (1.549 m)   Wt 113 lb 12.8 oz (51.6 kg)   SpO2 93%   BMI 21.50 kg/m     Review of Systems  Constitutional: Negative.   HENT: Negative for congestion, dental problem, hearing loss, rhinorrhea, sinus pressure, sore throat and tinnitus.   Eyes: Negative for pain, discharge and visual disturbance.  Respiratory: Negative for cough and shortness of breath.   Cardiovascular: Negative for chest pain, palpitations and leg swelling.  Gastrointestinal: Negative for abdominal distention, abdominal pain, blood in stool, constipation, diarrhea, nausea and vomiting.  Genitourinary: Negative for difficulty urinating, dysuria, flank pain, frequency, hematuria, pelvic pain, urgency, vaginal bleeding, vaginal discharge and vaginal pain.  Musculoskeletal:  Negative for arthralgias, gait problem and joint swelling.       Right calf cramping  Skin: Negative for rash.  Neurological: Negative for dizziness, syncope, speech difficulty, weakness, numbness and headaches.  Hematological: Negative for adenopathy.  Psychiatric/Behavioral: Negative for agitation, behavioral problems and dysphoric mood. The patient is not nervous/anxious.        Objective:   Physical Exam  Constitutional: She is oriented to person, place, and time. She appears well-developed and well-nourished.  HENT:  Head: Normocephalic.  Right Ear: External ear normal.  Left Ear: External ear  normal.  Mouth/Throat: Oropharynx is clear and moist.  Eyes: Conjunctivae and EOM are normal. Pupils are equal, round, and reactive to light.  Neck: Normal range of motion. Neck supple. No thyromegaly present.  Cardiovascular: Normal rate, regular rhythm, normal heart sounds and intact distal pulses.   Pedal pulses full  Pulmonary/Chest: Effort normal and breath sounds normal.  kyphosis  Abdominal: Soft. Bowel sounds are normal. She exhibits no mass. There is no tenderness.  Musculoskeletal: Normal range of motion.  Lymphadenopathy:    She has no cervical adenopathy.  Neurological: She is alert and oriented to person, place, and time.  Skin: Skin is warm and dry. No rash noted.  1 x 1.5 cm pigmented lesion right temporal area consistent with a seborrheic dermatosis  Psychiatric: She has a normal mood and affect. Her behavior is normal.          Assessment & Plan:   Hypothyroidism.  We'll check a TSH  COPD stable  Dyslipidemia.  Continue statin therapy    Rogelia Boga

## 2017-06-24 ENCOUNTER — Ambulatory Visit: Payer: Medicare Other | Admitting: Internal Medicine

## 2017-08-16 ENCOUNTER — Other Ambulatory Visit: Payer: Self-pay | Admitting: Internal Medicine

## 2017-08-23 DIAGNOSIS — L821 Other seborrheic keratosis: Secondary | ICD-10-CM | POA: Diagnosis not present

## 2017-08-23 DIAGNOSIS — L570 Actinic keratosis: Secondary | ICD-10-CM | POA: Diagnosis not present

## 2017-09-07 ENCOUNTER — Ambulatory Visit (INDEPENDENT_AMBULATORY_CARE_PROVIDER_SITE_OTHER): Payer: Medicare Other | Admitting: *Deleted

## 2017-09-07 DIAGNOSIS — Z23 Encounter for immunization: Secondary | ICD-10-CM

## 2017-09-30 ENCOUNTER — Telehealth: Payer: Self-pay | Admitting: Internal Medicine

## 2017-09-30 NOTE — Telephone Encounter (Signed)
Please advise 

## 2017-09-30 NOTE — Telephone Encounter (Signed)
Pt needs another handicap renewal form

## 2017-10-03 NOTE — Telephone Encounter (Signed)
Okay 

## 2017-10-03 NOTE — Telephone Encounter (Signed)
Pt made aware that handicap form is located at the front desk for pick up.

## 2017-10-04 ENCOUNTER — Telehealth: Payer: Self-pay | Admitting: Internal Medicine

## 2017-10-05 NOTE — Telephone Encounter (Signed)
disregard

## 2017-11-09 ENCOUNTER — Ambulatory Visit: Payer: Medicare Other | Admitting: Internal Medicine

## 2017-11-12 ENCOUNTER — Other Ambulatory Visit: Payer: Self-pay | Admitting: Internal Medicine

## 2017-12-06 ENCOUNTER — Ambulatory Visit (INDEPENDENT_AMBULATORY_CARE_PROVIDER_SITE_OTHER): Payer: Medicare Other | Admitting: Internal Medicine

## 2017-12-06 ENCOUNTER — Encounter: Payer: Self-pay | Admitting: Internal Medicine

## 2017-12-06 VITALS — BP 146/60 | HR 76 | Temp 98.2°F | Ht 61.0 in | Wt 115.2 lb

## 2017-12-06 DIAGNOSIS — J449 Chronic obstructive pulmonary disease, unspecified: Secondary | ICD-10-CM | POA: Diagnosis not present

## 2017-12-06 DIAGNOSIS — E039 Hypothyroidism, unspecified: Secondary | ICD-10-CM

## 2017-12-06 MED ORDER — ALBUTEROL SULFATE HFA 108 (90 BASE) MCG/ACT IN AERS: 2.0000 | INHALATION_SPRAY | Freq: Four times a day (QID) | RESPIRATORY_TRACT | 5 refills | Status: DC | PRN

## 2017-12-06 NOTE — Patient Instructions (Signed)
Levothyroxine 75 mcg daily  Return in 6 months for follow-up

## 2017-12-06 NOTE — Progress Notes (Signed)
Subjective:    Patient ID: Holly [name], female    DOB: [birth year], 84 y.o.   MRN: [id number]  HPI  83 year old patient who has a history of COPD.  This has been stable.  She is requesting a refill on albuterol. She has hypothyroidism and TSH was slightly elevated earlier.  Levothyroxine was increased from 50 to 75 mcg daily.  She has received a new prescription for 75 but she still is using 50 mcg daily  Past Medical History:  Diagnosis Date  . CAD (coronary artery disease)   . CELLULITIS 06/18/2008  . CHF (congestive heart failure) (HCC)   . CHRONIC OBSTRUCTIVE PULMONARY DISEASE, ACUTE EXACERBATION 11/16/2007  . COPD 05/18/2007  . GERD 12/25/2007  . HYPERLIPIDEMIA 05/18/2007  . HYPOTHYROIDISM 05/18/2007  . Macular degeneration   . Urosepsis      Social History   Socioeconomic History  . Marital status: Married    Spouse name: Not on file  . Number of children: Not on file  . Years of education: Not on file  . Highest education level: Not on file  Social Needs  . Financial resource strain: Not on file  . Food insecurity - worry: Not on file  . Food insecurity - inability: Not on file  . Transportation needs - medical: Not on file  . Transportation needs - non-medical: Not on file  Occupational History  . Not on file  Tobacco Use  . Smoking status: Former Smoker    Last attempt to quit: 11/29/1985    Years since quitting: 32.0  . Smokeless tobacco: Never Used  Substance and Sexual Activity  . Alcohol use: No  . Drug use: No  . Sexual activity: Not on file  Other Topics Concern  . Not on file  Social History Narrative  . Not on file    Past Surgical History:  Procedure Laterality Date  . CATARACT EXTRACTION    . DILATION AND CURETTAGE OF UTERUS    . VARICOSE VEIN SURGERY      History reviewed. No pertinent family history.  Allergies  Allergen Reactions  . Levofloxacin     REACTION: unspecified  . Macrobid [Nitrofurantoin Macrocrystal] Nausea Only  .  Sulfamethoxazole     REACTION: unspecified  . Tramadol Other (See Comments)    Shakes     Current Outpatient Medications on File Prior to Visit  Medication Sig Dispense Refill  . ADVAIR DISKUS 500-50 MCG/DOSE AEPB INHALE 1 PUFF INTO THE LUNGS EVERY 12 HOURS. 180 each 1  . atorvastatin (LIPITOR) 10 MG tablet TAKE 1 TABLET BY MOUTH EVERY DAY 90 tablet 1  . levothyroxine (SYNTHROID, LEVOTHROID) 75 MCG tablet TAKE 1 TABLET (75 MCG TOTAL) BY MOUTH DAILY. 90 tablet 2  . PROVENTIL HFA 108 (90 BASE) MCG/ACT inhaler INHALE 2 PUFFS EVERY 6 HOURS AS NEEDED (Patient not taking: Reported on 12/06/2017) 20.1 each 5   No current facility-administered medications on file prior to visit.     BP (!) 146/60 (BP Location: Left Arm, Patient Position: Sitting, Cuff Size: Normal)   Pulse 76   Temp 98.2 F (36.8 C) (Oral)   Ht 5\' 1"  (1.549 m)   Wt 115 lb 3.2 oz (52.3 kg)   SpO2 96%   BMI 21.77 kg/m     Review of Systems  Constitutional: Negative.   HENT: Negative for congestion, dental problem, hearing loss, rhinorrhea, sinus pressure, sore throat and tinnitus.   Eyes: Negative for pain, discharge and visual disturbance.  Respiratory: Positive  for shortness of breath. Negative for cough.   Cardiovascular: Negative for chest pain, palpitations and leg swelling.  Gastrointestinal: Negative for abdominal distention, abdominal pain, blood in stool, constipation, diarrhea, nausea and vomiting.  Genitourinary: Negative for difficulty urinating, dysuria, flank pain, frequency, hematuria, pelvic pain, urgency, vaginal bleeding, vaginal discharge and vaginal pain.  Musculoskeletal: Negative for arthralgias, gait problem and joint swelling.  Skin: Negative for rash.  Neurological: Negative for dizziness, syncope, speech difficulty, weakness, numbness and headaches.  Hematological: Negative for adenopathy.  Psychiatric/Behavioral: Negative for agitation, behavioral problems and dysphoric mood. The patient is not  nervous/anxious.        Objective:   Physical Exam  Constitutional: She is oriented to person, place, and time. She appears well-developed and well-nourished.  HENT:  Head: Normocephalic.  Right Ear: External ear normal.  Left Ear: External ear normal.  Mouth/Throat: Oropharynx is clear and moist.  Eyes: Conjunctivae and EOM are normal. Pupils are equal, round, and reactive to light.  Neck: Normal range of motion. Neck supple. No thyromegaly present.  Cardiovascular: Normal rate, regular rhythm, normal heart sounds and intact distal pulses.  Pulmonary/Chest: Effort normal.  Decreased breath sounds Kyphosis  Abdominal: Soft. Bowel sounds are normal. She exhibits no mass. There is no tenderness.  Musculoskeletal: Normal range of motion.  Lymphadenopathy:    She has no cervical adenopathy.  Neurological: She is alert and oriented to person, place, and time.  Skin: Skin is warm and dry. No rash noted.  Psychiatric: She has a normal mood and affect. Her behavior is normal.          Assessment & Plan:   COPD.  No change in medical regimen medicines refilled Hypothyroidism.  Continue 75 mcg daily  Return in 6 months for follow-up  Rogelia Boga

## 2017-12-27 DIAGNOSIS — Z961 Presence of intraocular lens: Secondary | ICD-10-CM | POA: Diagnosis not present

## 2017-12-27 DIAGNOSIS — H43813 Vitreous degeneration, bilateral: Secondary | ICD-10-CM | POA: Diagnosis not present

## 2017-12-27 DIAGNOSIS — H353233 Exudative age-related macular degeneration, bilateral, with inactive scar: Secondary | ICD-10-CM | POA: Diagnosis not present

## 2018-01-01 ENCOUNTER — Other Ambulatory Visit: Payer: Self-pay | Admitting: Internal Medicine

## 2018-01-31 ENCOUNTER — Telehealth: Payer: Self-pay | Admitting: Family Medicine

## 2018-01-31 NOTE — Telephone Encounter (Signed)
Copied from [place]. Topic: Inquiry >> Jan 31, 2018  9:59 AM Aurelio Brash B wrote: Reason for CRM: Pt is asking if Dr Raliegh Ip can give her a letter stating  her son,  Kelia Gibbon is her caregiver.   She states Dr Raliegh Ip has done this before and  the sons lawyer says it needs to be revised.  PT is requesting a call on her home phone when she can pick up letter.

## 2018-02-03 NOTE — Telephone Encounter (Signed)
Spoke with patient son and informed him that Dr.Kwiatkowski will be in Monday to revive the letter for him.

## 2018-02-03 NOTE — Telephone Encounter (Signed)
There is a similar letter on file from 09/17/2016. Okay to printed updated letter?

## 2018-02-05 NOTE — Telephone Encounter (Signed)
Yes okay to do updated letter

## 2018-02-06 ENCOUNTER — Encounter: Payer: Self-pay | Admitting: *Deleted

## 2018-02-06 NOTE — Telephone Encounter (Signed)
Letter printed, signed, and filed at front desk for pick up.  Patient's son notified letter is ready for pick up.

## 2018-02-15 ENCOUNTER — Other Ambulatory Visit: Payer: Self-pay | Admitting: Internal Medicine

## 2018-05-02 ENCOUNTER — Ambulatory Visit (INDEPENDENT_AMBULATORY_CARE_PROVIDER_SITE_OTHER): Payer: Medicare Other | Admitting: Internal Medicine

## 2018-05-02 ENCOUNTER — Encounter: Payer: Self-pay | Admitting: Internal Medicine

## 2018-05-02 VITALS — BP 110/60 | HR 66 | Temp 98.2°F | Wt 108.0 lb

## 2018-05-02 DIAGNOSIS — E039 Hypothyroidism, unspecified: Secondary | ICD-10-CM | POA: Diagnosis not present

## 2018-05-02 DIAGNOSIS — J441 Chronic obstructive pulmonary disease with (acute) exacerbation: Secondary | ICD-10-CM

## 2018-05-02 MED ORDER — AZITHROMYCIN 250 MG PO TABS: ORAL_TABLET | ORAL | 0 refills | Status: DC

## 2018-05-02 MED ORDER — BENZONATATE 100 MG PO CAPS: 100.0000 mg | ORAL_CAPSULE | Freq: Two times a day (BID) | ORAL | 0 refills | Status: DC | PRN

## 2018-05-02 NOTE — Patient Instructions (Addendum)
Take over-the-counter expectorants and cough medications such as  Mucinex DM.  Call if there is no improvement in 5 to 7 days or if  you develop worsening cough, fever, or new symptoms, such as shortness of breath or chest pain.  Hydrate and Humidify  Drink enough water to keep your urine clear or pale yellow. Staying hydrated will help to thin your mucus.  Use a cool mist humidifier to keep the humidity level in your home above 50%.  Inhale steam for 10-15 minutes, 3-4 times a day or as told by your health care provider. You can do this in the bathroom while a hot shower is running.  Limit your exposure to cool or dry air. Rest  Rest as much as possible.  Take [phone] mg of Tylenol every 6 hours as needed for pain relief or fever.  Avoid taking more than 3000 mg in a 24-hour period (  This may cause liver damage).  Drink as much fluid as you  can tolerate over the next few days

## 2018-05-02 NOTE — Progress Notes (Signed)
Subjective:    Patient ID: Holly [name], female    DOB: 03/13/1928, 84 y.o.   MRN: [id number]  HPI  84 year old patient who has a history of COPD.  Patient presents with a 3 to 4-day history of increasing chest congestion cough she has had fever that ranges from 100 degrees to 101.8.  Symptoms started a couple days following a dental visit at which time she had 3 dental extractions.  Denies any wheezing or shortness of breath.  Cough is described as productive of green sputum  Past Medical History:  Diagnosis Date  . CAD (coronary artery disease)   . CELLULITIS 06/18/2008  . CHF (congestive heart failure) (HCC)   . CHRONIC OBSTRUCTIVE PULMONARY DISEASE, ACUTE EXACERBATION 11/16/2007  . COPD 05/18/2007  . GERD 12/25/2007  . HYPERLIPIDEMIA 05/18/2007  . HYPOTHYROIDISM 05/18/2007  . Macular degeneration   . Urosepsis      Social History   Socioeconomic History  . Marital status: Married    Spouse name: Not on file  . Number of children: Not on file  . Years of education: Not on file  . Highest education level: Not on file  Occupational History  . Not on file  Social Needs  . Financial resource strain: Not on file  . Food insecurity:    Worry: Not on file    Inability: Not on file  . Transportation needs:    Medical: Not on file    Non-medical: Not on file  Tobacco Use  . Smoking status: Former Smoker    Last attempt to quit: 11/29/1985    Years since quitting: 32.4  . Smokeless tobacco: Never Used  Substance and Sexual Activity  . Alcohol use: No  . Drug use: No  . Sexual activity: Not on file  Lifestyle  . Physical activity:    Days per week: Not on file    Minutes per session: Not on file  . Stress: Not on file  Relationships  . Social connections:    Talks on phone: Not on file    Gets together: Not on file    Attends religious service: Not on file    Active member of club or organization: Not on file    Attends meetings of clubs or organizations: Not on file    Relationship status: Not on file  . Intimate partner violence:    Fear of current or ex partner: Not on file    Emotionally abused: Not on file    Physically abused: Not on file    Forced sexual activity: Not on file  Other Topics Concern  . Not on file  Social History Narrative  . Not on file    Past Surgical History:  Procedure Laterality Date  . CATARACT EXTRACTION    . DILATION AND CURETTAGE OF UTERUS    . VARICOSE VEIN SURGERY      History reviewed. No pertinent family history.  Allergies  Allergen Reactions  . Levofloxacin     REACTION: unspecified  . Macrobid [Nitrofurantoin Macrocrystal] Nausea Only  . Sulfamethoxazole     REACTION: unspecified  . Tramadol Other (See Comments)    Shakes     Current Outpatient Medications on File Prior to Visit  Medication Sig Dispense Refill  . ADVAIR DISKUS 500-50 MCG/DOSE AEPB INHALE 1 PUFF INTO THE LUNGS EVERY 12 HOURS. 180 each 1  . albuterol (PROVENTIL HFA) 108 (90 Base) MCG/ACT inhaler Inhale 2 puffs into the lungs every 6 (six) hours as needed.  20.1 each 5  . atorvastatin (LIPITOR) 10 MG tablet TAKE 1 TABLET BY MOUTH EVERY DAY 90 tablet 1  . levothyroxine (SYNTHROID, LEVOTHROID) 50 MCG tablet TAKE 1 TABLET (50 MCG TOTAL) BY MOUTH DAILY BEFORE BREAKFAST. 90 tablet 3  . levothyroxine (SYNTHROID, LEVOTHROID) 75 MCG tablet TAKE 1 TABLET (75 MCG TOTAL) BY MOUTH DAILY. 90 tablet 2   No current facility-administered medications on file prior to visit.     BP 110/60   Pulse 66   Temp 98.2 F (36.8 C) (Oral)   Wt 108 lb (49 kg)   SpO2 93%   BMI 20.41 kg/m     Review of Systems  Constitutional: Positive for activity change, appetite change, fatigue and fever.  HENT: Negative for congestion, dental problem, hearing loss, rhinorrhea, sinus pressure, sore throat and tinnitus.   Eyes: Negative for pain, discharge and visual disturbance.  Respiratory: Positive for cough. Negative for shortness of breath.   Cardiovascular:  Negative for chest pain, palpitations and leg swelling.  Gastrointestinal: Negative for abdominal distention, abdominal pain, blood in stool, constipation, diarrhea, nausea and vomiting.  Genitourinary: Negative for difficulty urinating, dysuria, flank pain, frequency, hematuria, pelvic pain, urgency, vaginal bleeding, vaginal discharge and vaginal pain.  Musculoskeletal: Negative for arthralgias, gait problem and joint swelling.  Skin: Negative for rash.  Neurological: Positive for weakness. Negative for dizziness, syncope, speech difficulty, numbness and headaches.  Hematological: Negative for adenopathy.  Psychiatric/Behavioral: Negative for agitation, behavioral problems and dysphoric mood. The patient is not nervous/anxious.        Objective:   Physical Exam  Constitutional: She is oriented to person, place, and time. She appears well-developed and well-nourished. She does not appear ill.  Elderly frail but in no acute distress. Temperature 98.2    HENT:  Head: Normocephalic.  Right Ear: External ear normal.  Left Ear: External ear normal.  Mouth/Throat: Oropharynx is clear and moist.  Eyes: Pupils are equal, round, and reactive to light. Conjunctivae and EOM are normal.  Neck: Normal range of motion. Neck supple. No thyromegaly present.  Cardiovascular: Normal rate, regular rhythm, normal heart sounds and intact distal pulses.  Pulmonary/Chest: Effort normal and breath sounds normal.    Kyphoscoliosis generally diminished breath sounds few crackles at the right base  Abdominal: Soft. Bowel sounds are normal. She exhibits no mass. There is no tenderness.  Musculoskeletal: Normal range of motion.  Lymphadenopathy:    She has no cervical adenopathy.  Neurological: She is alert and oriented to person, place, and time.  Skin: Skin is warm and dry. No rash noted.  Psychiatric: She has a normal mood and affect. Her behavior is normal.          Assessment & Plan:   COPD  exacerbation.  Will treat with hydration rest and azithromycin.  She is requesting a refill on Tessalon which has been helpful before. Hypothyroidism  Will call with symptoms worsen or she fails to improve She is scheduled for an annual exam in 1 month  KWIATKOWSKI,PETER Homero Fellers

## 2018-05-09 ENCOUNTER — Encounter: Payer: Self-pay | Admitting: Internal Medicine

## 2018-05-09 ENCOUNTER — Ambulatory Visit: Payer: Self-pay | Admitting: Internal Medicine

## 2018-05-09 ENCOUNTER — Ambulatory Visit (INDEPENDENT_AMBULATORY_CARE_PROVIDER_SITE_OTHER): Payer: Medicare Other | Admitting: Internal Medicine

## 2018-05-09 VITALS — BP 110/70 | HR 73 | Temp 99.0°F | Wt 107.4 lb

## 2018-05-09 DIAGNOSIS — J441 Chronic obstructive pulmonary disease with (acute) exacerbation: Secondary | ICD-10-CM

## 2018-05-09 MED ORDER — AMOXICILLIN-POT CLAVULANATE 875-125 MG PO TABS: 1.0000 | ORAL_TABLET | Freq: Two times a day (BID) | ORAL | 0 refills | Status: DC

## 2018-05-09 NOTE — Telephone Encounter (Signed)
Pt according to daughter Holly [name] was coughing all last pm  Has been somewhat weak and is taking liquids by mouth but has to be coerced . Pt daughter advised to given pt liquids .Same day 30 min appointment made for today with Dr Burnice Logan   Reason for Disposition . [1] Continuous (nonstop) coughing interferes with work or school AND [2] no improvement using cough treatment per Care Advice  Answer Assessment - Initial Assessment Questions 1. ONSET: "When did the cough begin?"       9 DAYS  2. SEVERITY: "How bad is the cough today?"       Worse last pm  Coughed all night  3. RESPIRATORY DISTRESS: "Describe your breathing."       breathing  Ok   4. FEVER: "Do you have a fever?" If so, ask: "What is your temperature, how was it measured, and when did it start?"      100  Orally   This  Am   5. SPUTUM: "Describe the color of your sputum" (clear, white, yellow, green)       Thick white mucous   6. HEMOPTYSIS: "Are you coughing up any blood?" If so ask: "How much?" (flecks, streaks, tablespoons, etc.)      no 7. CARDIAC HISTORY: "Do you have any history of heart disease?" (e.g., heart attack, congestive heart failure)        no 8. LUNG HISTORY: "Do you have any history of lung disease?"  (e.g., pulmonary embolus, asthma, emphysema)     copd 9. PE RISK FACTORS: "Do you have a history of blood clots?" (or: recent major surgery, recent prolonged travel, bedridden )      no 10. OTHER SYMPTOMS: "Do you have any other symptoms?" (e.g., runny nose, wheezing, chest pain)        Pt  Taking sips of  water  decreased  appetite    11. PREGNANCY: "Is there any chance you are pregnant?" "When was your last menstrual period?"       n/a 12. TRAVEL: "Have you traveled out of the country in the last month?" (e.g., travel history, exposures)        n/a  Protocols used: [place]

## 2018-05-09 NOTE — Progress Notes (Signed)
Subjective:    Patient ID: Holly [name], female    DOB: 1928-09-08, 84 y.o.   MRN: [id number]  HPI  84 year old patient who was seen 1 week ago and treated for suspected exacerbation of COPD.  She was treated with a azithromycin.  She has improved modestly but still having cough and low-grade fever. Her symptoms started a couple days following multiple dental extractions.  She has completed antibiotic therapy .  She remains on maintenance Advair and albuterol  Past Medical History:  Diagnosis Date  . CAD (coronary artery disease)   . CELLULITIS 06/18/2008  . CHF (congestive heart failure) (HCC)   . CHRONIC OBSTRUCTIVE PULMONARY DISEASE, ACUTE EXACERBATION 11/16/2007  . COPD 05/18/2007  . GERD 12/25/2007  . HYPERLIPIDEMIA 05/18/2007  . HYPOTHYROIDISM 05/18/2007  . Macular degeneration   . Urosepsis      Social History   Socioeconomic History  . Marital status: Married    Spouse name: Not on file  . Number of children: Not on file  . Years of education: Not on file  . Highest education level: Not on file  Occupational History  . Not on file  Social Needs  . Financial resource strain: Not on file  . Food insecurity:    Worry: Not on file    Inability: Not on file  . Transportation needs:    Medical: Not on file    Non-medical: Not on file  Tobacco Use  . Smoking status: Former Smoker    Last attempt to quit: 11/29/1985    Years since quitting: 32.4  . Smokeless tobacco: Never Used  Substance and Sexual Activity  . Alcohol use: No  . Drug use: No  . Sexual activity: Not on file  Lifestyle  . Physical activity:    Days per week: Not on file    Minutes per session: Not on file  . Stress: Not on file  Relationships  . Social connections:    Talks on phone: Not on file    Gets together: Not on file    Attends religious service: Not on file    Active member of club or organization: Not on file    Attends meetings of clubs or organizations: Not on file    Relationship  status: Not on file  . Intimate partner violence:    Fear of current or ex partner: Not on file    Emotionally abused: Not on file    Physically abused: Not on file    Forced sexual activity: Not on file  Other Topics Concern  . Not on file  Social History Narrative  . Not on file    Past Surgical History:  Procedure Laterality Date  . CATARACT EXTRACTION    . DILATION AND CURETTAGE OF UTERUS    . VARICOSE VEIN SURGERY      History reviewed. No pertinent family history.  Allergies  Allergen Reactions  . Levofloxacin     REACTION: unspecified  . Macrobid [Nitrofurantoin Macrocrystal] Nausea Only  . Sulfamethoxazole     REACTION: unspecified  . Tramadol Other (See Comments)    Shakes     Current Outpatient Medications on File Prior to Visit  Medication Sig Dispense Refill  . ADVAIR DISKUS 500-50 MCG/DOSE AEPB INHALE 1 PUFF INTO THE LUNGS EVERY 12 HOURS. 180 each 1  . albuterol (PROVENTIL HFA) 108 (90 Base) MCG/ACT inhaler Inhale 2 puffs into the lungs every 6 (six) hours as needed. 20.1 each 5  . atorvastatin (LIPITOR) 10  MG tablet TAKE 1 TABLET BY MOUTH EVERY DAY 90 tablet 1  . benzonatate (TESSALON) 100 MG capsule Take 1 capsule (100 mg total) by mouth 2 (two) times daily as needed for cough. 20 capsule 0  . levothyroxine (SYNTHROID, LEVOTHROID) 75 MCG tablet TAKE 1 TABLET (75 MCG TOTAL) BY MOUTH DAILY. 90 tablet 2   No current facility-administered medications on file prior to visit.     BP 110/70 (BP Location: Left Arm, Patient Position: Sitting, Cuff Size: Normal)   Pulse 73   Temp 99 F (37.2 C) (Oral)   Wt 107 lb 6.4 oz (48.7 kg)   SpO2 94%   BMI 20.29 kg/m     Review of Systems  Constitutional: Positive for activity change, appetite change, fatigue and fever.  HENT: Negative for congestion, dental problem, hearing loss, rhinorrhea, sinus pressure, sore throat and tinnitus.   Eyes: Negative for pain, discharge and visual disturbance.  Respiratory:  Positive for cough. Negative for shortness of breath.   Cardiovascular: Negative for chest pain, palpitations and leg swelling.  Gastrointestinal: Negative for abdominal distention, abdominal pain, blood in stool, constipation, diarrhea, nausea and vomiting.  Genitourinary: Negative for difficulty urinating, dysuria, flank pain, frequency, hematuria, pelvic pain, urgency, vaginal bleeding, vaginal discharge and vaginal pain.  Musculoskeletal: Negative for arthralgias, gait problem and joint swelling.  Skin: Negative for rash.  Neurological: Negative for dizziness, syncope, speech difficulty, weakness, numbness and headaches.  Hematological: Negative for adenopathy.  Psychiatric/Behavioral: Negative for agitation, behavioral problems and dysphoric mood. The patient is not nervous/anxious.        Objective:   Physical Exam  Constitutional: She is oriented to person, place, and time. She appears well-developed and well-nourished. No distress.  Temperature 99 degrees  HENT:  Head: Normocephalic.  Right Ear: External ear normal.  Left Ear: External ear normal.  Mouth/Throat: Oropharynx is clear and moist.  Eyes: Pupils are equal, round, and reactive to light. Conjunctivae and EOM are normal.  Neck: Normal range of motion. Neck supple. No thyromegaly present.  Cardiovascular: Normal rate, regular rhythm, normal heart sounds and intact distal pulses.  Pulmonary/Chest: Effort normal. No stridor. No respiratory distress. She has rales.  Bibasilar rales.  More prominent compared to exam 1 week ago  Abdominal: Soft. Bowel sounds are normal. She exhibits no mass. There is no tenderness.  Musculoskeletal: Normal range of motion.  Lymphadenopathy:    She has no cervical adenopathy.  Neurological: She is alert and oriented to person, place, and time.  Skin: Skin is warm and dry. No rash noted.  Psychiatric: She has a normal mood and affect. Her behavior is normal.          Assessment & Plan:    COPD exacerbation Possible aspiration pneumonia.  Patient continues to have low-grade fever and cough.  We will continue maintenance bronchodilators continue expectorants and force fluids.  Will treat with Augmentin for 10 days.  Patient report any clinical worsening  Gordy Savers

## 2018-05-09 NOTE — Patient Instructions (Signed)
Take your antibiotic as prescribed until ALL of it is gone, but stop if you develop a rash, swelling, or any side effects of the medication.  Contact our office as soon as possible if  there are side effects of the medication.  Mucinex 1 tablet daily  Delsym 1 teaspoon twice daily  Drink as much fluid as you  can tolerate over the next few days; stay well-hydrated.

## 2018-05-09 NOTE — Telephone Encounter (Addendum)
Patients daughter Daine Floras called stating the patient did not want to keep the appointment today with Dr Inda Merlin. Spoke with patient who was advised that she shoud be seen as she was still having cough low grade fever and decreased po intake. Patient  Stated she would keep the appointment  Today as  Scheduled. See previous triage encounter done today

## 2018-05-11 ENCOUNTER — Ambulatory Visit: Payer: Self-pay | Admitting: Internal Medicine

## 2018-05-11 ENCOUNTER — Encounter: Payer: Self-pay | Admitting: Family Medicine

## 2018-05-11 ENCOUNTER — Ambulatory Visit (INDEPENDENT_AMBULATORY_CARE_PROVIDER_SITE_OTHER): Payer: Medicare Other | Admitting: Family Medicine

## 2018-05-11 ENCOUNTER — Ambulatory Visit (INDEPENDENT_AMBULATORY_CARE_PROVIDER_SITE_OTHER): Payer: Medicare Other

## 2018-05-11 VITALS — BP 116/64 | HR 81 | Temp 98.7°F | Ht 61.0 in | Wt 106.2 lb

## 2018-05-11 DIAGNOSIS — R05 Cough: Secondary | ICD-10-CM | POA: Diagnosis not present

## 2018-05-11 DIAGNOSIS — J449 Chronic obstructive pulmonary disease, unspecified: Secondary | ICD-10-CM | POA: Diagnosis not present

## 2018-05-11 DIAGNOSIS — R059 Cough, unspecified: Secondary | ICD-10-CM

## 2018-05-11 DIAGNOSIS — J984 Other disorders of lung: Secondary | ICD-10-CM | POA: Diagnosis not present

## 2018-05-11 MED ORDER — METHYLPREDNISOLONE ACETATE 80 MG/ML IJ SUSP
80.0000 mg | Freq: Once | INTRAMUSCULAR | Status: AC
  Administered 2018-05-11: 80 mg via INTRAMUSCULAR

## 2018-05-11 MED ORDER — GUAIFENESIN-DM 100-10 MG/5ML PO SYRP: 5.0000 mL | ORAL_SOLUTION | ORAL | 0 refills | Status: DC | PRN

## 2018-05-11 NOTE — Telephone Encounter (Signed)
See note

## 2018-05-11 NOTE — Patient Instructions (Addendum)
Start the cough syrup. We will give you an injection of an anti-inflammatory steroid today.  Please stay well hydrated.  You can take tylenol and/or motrin as needed for low grade fever and pain.  Please let me or your PCP know if your symptoms worsen or fail to improve.  Take care, Dr Jerline Pain

## 2018-05-11 NOTE — Telephone Encounter (Signed)
I returned a call to [facility] regarding this pt.   Pt continues to have a bad cough especially at night.   She is using the Delsym and has finished the [employer] that was prescribed for her without improvement.    She has bad COPD but the cough is still bad. She has been seen for this twice recently.  I spoke with the flow coordinator at the [place] office.  I made her aware of the situation.    They are booked today plus she recommended the pt go to the [place] location because they have provider openings plus they have an x-ray machine on site.    I made an appt with Dr. Jerline Pain at the [place] location for today at 3:40.    Susie was agreeable with this plan.   She asked that the appt be later in the afternoon due getting the pt out of bed and ready so get there in time.      Reason for Disposition . [1] Fever > 100.0 F (37.8 C) AND [2] bedridden (e.g., nursing home patient, CVA, chronic illness, recovering from surgery)  Answer Assessment - Initial Assessment Questions 1. ONSET: "When did the cough begin?"      Coughing up thick white sputum.  Coughing all night even with the Delsym and Tesson Pearles.   She's taking the Amoxicillin. 2. SEVERITY: "How bad is the cough today?"      It's still not better 3. RESPIRATORY DISTRESS: "Describe your breathing."      No trouble breathing.   Not using rescue inhaler, doesn't feel she needs it. 4. FEVER: "Do you have a fever?" If so, ask: "What is your temperature, how was it measured, and when did it start?"     Yes.  99.9-100.9 to 101.   This morning it's 100.   I'm giving her Tylenol. 5. SPUTUM: "Describe the color of your sputum" (clear, white, yellow, green)     Thick white sputum.   Can't take Advil due to eye problem. 6. HEMOPTYSIS: "Are you coughing up any blood?" If so ask: "How much?" (flecks, streaks, tablespoons, etc.)     No blood 7. CARDIAC HISTORY: "Do you have any history of heart disease?" (e.g., heart attack,  congestive heart failure)      No 8. LUNG HISTORY: "Do you have any history of lung disease?"  (e.g., pulmonary embolus, asthma, emphysema)     No   Just COPD 9. PE RISK FACTORS: "Do you have a history of blood clots?" (or: recent major surgery, recent prolonged travel, bedridden )     No 10. OTHER SYMPTOMS: "Do you have any other symptoms?" (e.g., runny nose, wheezing, chest pain)       No   Gets a headache.   She is still in the bed.    She just wants to stay in the bed all the time. 11. PREGNANCY: "Is there any chance you are pregnant?" "When was your last menstrual period?"       N/A 12. TRAVEL: "Have you traveled out of the country in the last month?" (e.g., travel history, exposures)       No  Protocols used: [place]

## 2018-05-11 NOTE — Progress Notes (Signed)
   Subjective:  Holly [name] is a 84 y.o. female who presents today for same-day appointment with a chief complaint of cough.   HPI:  Cough, established problem, uncontrolled Started 2 weeks ago. Has seen her PCP twice for this over the last couple weeks.  Initially was given a prescription for azithromycin and Tessalon.  Symptoms persisted and she was seen 2 days ago.  At that time there was concern for possible aspiration pneumonia given she had recent dental work.  She was started on Augmentin.  Cough has persisted since then.  She has had occasional fevers.  Cough is productive of yellowish-green sputum.  No wheezing.  No shortness of breath.  ROS: Per HPI  Objective:  Physical Exam: BP 116/64 (BP Location: Left Arm, Patient Position: Sitting, Cuff Size: Normal)   Pulse 81   Temp 98.7 F (37.1 C) (Oral)   Ht 5\' 1"  (1.549 m)   Wt 106 lb 3.2 oz (48.2 kg)   SpO2 96%   BMI 20.07 kg/m   Gen: 84 year old woman in NAD, resting comfortably on chair CV: RRR with no murmurs appreciated Pulm: NWOB, CTAB with no crackles, wheezes, or rhonchi  Assessment/Plan:  Cough Her CXR does not have any signs of PNA today based on my read. We will await radiology read.  Her pulmonary exam is stable and she is satting at 96% on room air.  She has underlying COPD which is contributing.  We will give 80 mg of IM Depo-Medrol today for her cough and COPD exacerbation.  I will also send in guaifenesin-dextromethorphan cough syrup.  Encouraged good oral hydration.  She will continue Augmentin to complete her course of antibiotics.  Discussed reasons to return to care.  Follow-up as needed.  [name]. Jerline Pain, MD 05/11/2018 3:36 PM

## 2018-05-11 NOTE — Telephone Encounter (Signed)
Patient was seen in the office on 05/11/2018.

## 2018-05-15 ENCOUNTER — Other Ambulatory Visit: Payer: Self-pay

## 2018-05-15 DIAGNOSIS — R059 Cough, unspecified: Secondary | ICD-10-CM

## 2018-05-15 DIAGNOSIS — R05 Cough: Secondary | ICD-10-CM

## 2018-06-05 ENCOUNTER — Encounter: Payer: Self-pay | Admitting: Internal Medicine

## 2018-06-05 ENCOUNTER — Ambulatory Visit (INDEPENDENT_AMBULATORY_CARE_PROVIDER_SITE_OTHER): Payer: Medicare Other | Admitting: Internal Medicine

## 2018-06-05 VITALS — HR 60 | Temp 98.0°F | Wt 108.6 lb

## 2018-06-05 DIAGNOSIS — E039 Hypothyroidism, unspecified: Secondary | ICD-10-CM | POA: Diagnosis not present

## 2018-06-05 DIAGNOSIS — J441 Chronic obstructive pulmonary disease with (acute) exacerbation: Secondary | ICD-10-CM | POA: Diagnosis not present

## 2018-06-05 DIAGNOSIS — I2510 Atherosclerotic heart disease of native coronary artery without angina pectoris: Secondary | ICD-10-CM

## 2018-06-05 DIAGNOSIS — E785 Hyperlipidemia, unspecified: Secondary | ICD-10-CM

## 2018-06-05 NOTE — Patient Instructions (Signed)
Follow-up with Dr. Jerline Pain in approximately 6 months  Annual flu vaccine this fall  No change in your medical regimen  GOOD LUCK!!

## 2018-06-05 NOTE — Progress Notes (Signed)
Subjective:    Patient ID: Holly [name], female    DOB: 08-31-1928, 84 y.o.   MRN: [id number]  HPI  84 year old patient who is seen today for her 72-month follow-up.  She had a difficult June and was seen on 3 occasions for exacerbation of COPD.  A chest x-ray was obtained that revealed some nodular densities involving the left mid to lower lung fields thought to be inflammatory.  A follow-up chest x-ray has been ordered.  The patient has completed antibiotic therapy and feels quite well today No new concerns or complaints. She remains on maintenance Advair.  Medical issues include hypothyroidism and dyslipidemia  Past Medical History:  Diagnosis Date  . CAD (coronary artery disease)   . CELLULITIS 06/18/2008  . CHF (congestive heart failure) (HCC)   . CHRONIC OBSTRUCTIVE PULMONARY DISEASE, ACUTE EXACERBATION 11/16/2007  . COPD 05/18/2007  . GERD 12/25/2007  . HYPERLIPIDEMIA 05/18/2007  . HYPOTHYROIDISM 05/18/2007  . Macular degeneration   . Urosepsis      Social History   Socioeconomic History  . Marital status: Married    Spouse name: Not on file  . Number of children: Not on file  . Years of education: Not on file  . Highest education level: Not on file  Occupational History  . Not on file  Social Needs  . Financial resource strain: Not on file  . Food insecurity:    Worry: Not on file    Inability: Not on file  . Transportation needs:    Medical: Not on file    Non-medical: Not on file  Tobacco Use  . Smoking status: Former Smoker    Last attempt to quit: 11/29/1985    Years since quitting: 32.5  . Smokeless tobacco: Never Used  Substance and Sexual Activity  . Alcohol use: No  . Drug use: No  . Sexual activity: Not on file  Lifestyle  . Physical activity:    Days per week: Not on file    Minutes per session: Not on file  . Stress: Not on file  Relationships  . Social connections:    Talks on phone: Not on file    Gets together: Not on file    Attends  religious service: Not on file    Active member of club or organization: Not on file    Attends meetings of clubs or organizations: Not on file    Relationship status: Not on file  . Intimate partner violence:    Fear of current or ex partner: Not on file    Emotionally abused: Not on file    Physically abused: Not on file    Forced sexual activity: Not on file  Other Topics Concern  . Not on file  Social History Narrative  . Not on file    Past Surgical History:  Procedure Laterality Date  . CATARACT EXTRACTION    . DILATION AND CURETTAGE OF UTERUS    . VARICOSE VEIN SURGERY      History reviewed. No pertinent family history.  Allergies  Allergen Reactions  . Levofloxacin     REACTION: unspecified  . Macrobid [Nitrofurantoin Macrocrystal] Nausea Only  . Sulfamethoxazole     REACTION: unspecified  . Tramadol Other (See Comments)    Shakes     Current Outpatient Medications on File Prior to Visit  Medication Sig Dispense Refill  . ADVAIR DISKUS 500-50 MCG/DOSE AEPB INHALE 1 PUFF INTO THE LUNGS EVERY 12 HOURS. 180 each 1  . albuterol (  PROVENTIL HFA) 108 (90 Base) MCG/ACT inhaler Inhale 2 puffs into the lungs every 6 (six) hours as needed. 20.1 each 5  . atorvastatin (LIPITOR) 10 MG tablet TAKE 1 TABLET BY MOUTH EVERY DAY 90 tablet 1  . levothyroxine (SYNTHROID, LEVOTHROID) 75 MCG tablet TAKE 1 TABLET (75 MCG TOTAL) BY MOUTH DAILY. 90 tablet 2   No current facility-administered medications on file prior to visit.     Pulse 60   Temp 98 F (36.7 C) (Oral)   Wt 108 lb 9.6 oz (49.3 kg)   SpO2 96%   BMI 20.52 kg/m     Review of Systems  Constitutional: Positive for fatigue.  HENT: Negative for congestion, dental problem, hearing loss, rhinorrhea, sinus pressure, sore throat and tinnitus.   Eyes: Negative for pain, discharge and visual disturbance.  Respiratory: Positive for shortness of breath. Negative for cough.   Cardiovascular: Negative for chest pain,  palpitations and leg swelling.  Gastrointestinal: Negative for abdominal distention, abdominal pain, blood in stool, constipation, diarrhea, nausea and vomiting.  Genitourinary: Negative for difficulty urinating, dysuria, flank pain, frequency, hematuria, pelvic pain, urgency, vaginal bleeding, vaginal discharge and vaginal pain.  Musculoskeletal: Negative for arthralgias, gait problem and joint swelling.  Skin: Negative for rash.  Neurological: Negative for dizziness, syncope, speech difficulty, weakness, numbness and headaches.  Hematological: Negative for adenopathy.  Psychiatric/Behavioral: Negative for agitation, behavioral problems and dysphoric mood. The patient is not nervous/anxious.        Objective:   Physical Exam  Constitutional: She is oriented to person, place, and time. She appears well-developed and well-nourished.  HENT:  Head: Normocephalic.  Right Ear: External ear normal.  Left Ear: External ear normal.  Mouth/Throat: Oropharynx is clear and moist.  Eyes: Pupils are equal, round, and reactive to light. Conjunctivae and EOM are normal.  Neck: Normal range of motion. Neck supple. No thyromegaly present.  Cardiovascular: Normal rate, regular rhythm, normal heart sounds and intact distal pulses.  Pulmonary/Chest: Effort normal and breath sounds normal.  Diminished breath sounds but clear Kyphosis  Abdominal: Soft. Bowel sounds are normal. She exhibits no mass. There is no tenderness.  Musculoskeletal: Normal range of motion.  Lymphadenopathy:    She has no cervical adenopathy.  Neurological: She is alert and oriented to person, place, and time.  Skin: Skin is warm and dry. No rash noted.  Psychiatric: She has a normal mood and affect. Her behavior is normal.          Assessment & Plan:   COPD History of COPD exacerbation resolved Hypothyroidism Dyslipidemia continue statin therapy.    Follow-up with new provider in 6 months Follow chest x-ray as  scheduled  Gordy Savers

## 2018-07-04 ENCOUNTER — Ambulatory Visit (INDEPENDENT_AMBULATORY_CARE_PROVIDER_SITE_OTHER): Payer: Medicare Other | Admitting: Surgery

## 2018-07-04 ENCOUNTER — Ambulatory Visit (INDEPENDENT_AMBULATORY_CARE_PROVIDER_SITE_OTHER): Payer: Medicare Other

## 2018-07-04 ENCOUNTER — Encounter (INDEPENDENT_AMBULATORY_CARE_PROVIDER_SITE_OTHER): Payer: Self-pay | Admitting: Surgery

## 2018-07-04 VITALS — BP 143/66 | HR 67

## 2018-07-04 DIAGNOSIS — M25572 Pain in left ankle and joints of left foot: Secondary | ICD-10-CM

## 2018-07-04 DIAGNOSIS — I2510 Atherosclerotic heart disease of native coronary artery without angina pectoris: Secondary | ICD-10-CM

## 2018-07-04 NOTE — Progress Notes (Signed)
Office Visit Note   Patient: Holly [name]           Date of Birth: [birth year]           MRN: [id number] Visit Date: 07/04/2018              Requested by: Gordy Savers, MD [address] [place], [place] [address] PCP: Gordy Savers, MD   Assessment & Plan: Visit Diagnoses:  1. Pain in left ankle and joints of left foot   Question fourth metatarsal neck fracture nondisplaced  Plan:  We will plan to treat this conservatively with postop shoe.  Recommend the patient wear this over the next couple weeks.  Continue to elevate foot as much as possible to decrease any swelling.  Follow-up in a couple of weeks with me for recheck.  Follow-Up Instructions: Return in about 2 weeks (around 07/18/2018) for with james.   Orders:  Orders Placed This Encounter  Procedures  . XR Foot Complete Left   No orders of the defined types were placed in this encounter.     Procedures: No procedures performed   Clinical Data: No additional findings.   Subjective: Chief Complaint  Patient presents with  . Left Foot - Pain, Edema    HPI 84 year old female comes in today with complaints of left foot pain.  States that last week she was making up her bed when she stepped on a pillow and thinks that she may have rolled her foot.  She is complaining of pain and some swelling around the distal fourth metatarsal.  Some pain with weightbearing. Review of Systems No current cardiac pulmonary GI GU issues  Objective: Vital Signs: BP (!) 143/66 (BP Location: Left Arm, Patient Position: Sitting)   Pulse 67   Physical Exam  Constitutional: She is oriented to person, place, and time. No distress.  HENT:  Head: Normocephalic and atraumatic.  Eyes: Pupils are equal, round, and reactive to light. EOM are normal.  Pulmonary/Chest: No respiratory distress.  Musculoskeletal:  Gait is minimally antalgic.  She is exquisitely tender over the distal fourth metatarsal.  Less tender over  the distal third.  Does have some swelling forefoot.  Neurovascular intact  Neurological: She is alert and oriented to person, place, and time.  Skin: Skin is warm and dry.    Ortho Exam  Specialty Comments:  No specialty comments available.  Imaging: No results found.   PMFS History: Patient Active Problem List   Diagnosis Date Noted  . Coronary atherosclerosis 02/13/2010  . CELLULITIS 06/18/2008  . GERD 12/25/2007  . CHRONIC OBSTRUCTIVE PULMONARY DISEASE, ACUTE EXACERBATION 11/16/2007  . Hypothyroidism 05/18/2007  . Dyslipidemia 05/18/2007  . COPD mixed type (HCC) 05/18/2007   Past Medical History:  Diagnosis Date  . CAD (coronary artery disease)   . CELLULITIS 06/18/2008  . CHF (congestive heart failure) (HCC)   . CHRONIC OBSTRUCTIVE PULMONARY DISEASE, ACUTE EXACERBATION 11/16/2007  . COPD 05/18/2007  . GERD 12/25/2007  . HYPERLIPIDEMIA 05/18/2007  . HYPOTHYROIDISM 05/18/2007  . Macular degeneration   . Urosepsis     History reviewed. No pertinent family history.  Past Surgical History:  Procedure Laterality Date  . CATARACT EXTRACTION    . DILATION AND CURETTAGE OF UTERUS    . VARICOSE VEIN SURGERY     Social History   Occupational History  . Not on file  Tobacco Use  . Smoking status: Former Smoker    Last attempt to quit: 11/29/1985  Years since quitting: 32.6  . Smokeless tobacco: Never Used  Substance and Sexual Activity  . Alcohol use: No  . Drug use: No  . Sexual activity: Not on file

## 2018-07-19 ENCOUNTER — Ambulatory Visit (INDEPENDENT_AMBULATORY_CARE_PROVIDER_SITE_OTHER): Payer: Medicare Other | Admitting: Surgery

## 2018-07-19 ENCOUNTER — Ambulatory Visit (INDEPENDENT_AMBULATORY_CARE_PROVIDER_SITE_OTHER): Payer: Medicare Other

## 2018-07-19 ENCOUNTER — Encounter (INDEPENDENT_AMBULATORY_CARE_PROVIDER_SITE_OTHER): Payer: Self-pay | Admitting: Surgery

## 2018-07-19 DIAGNOSIS — M25572 Pain in left ankle and joints of left foot: Secondary | ICD-10-CM

## 2018-07-19 DIAGNOSIS — I2510 Atherosclerotic heart disease of native coronary artery without angina pectoris: Secondary | ICD-10-CM | POA: Diagnosis not present

## 2018-07-19 NOTE — Progress Notes (Signed)
Office Visit Note   Patient: Holly [name]           Date of Birth: 1928-05-16           MRN: [id number] Visit Date: 07/19/2018              Requested by: Gordy Savers, MD [address] [place], [place] [address] PCP: Gordy Savers, MD   Assessment & Plan: Visit Diagnoses:  1. Pain in left ankle and joints of left foot     Plan: Patient does have a fourth metatarsal neck fracture.  She will continue using postop shoe and elevating foot is much as possible to decrease swelling.  Follow-up with me in 3 weeks for recheck with repeat x-ray.  Follow-Up Instructions: Return in about 3 weeks (around 08/09/2018) for with Blimi Godby.   Orders:  Orders Placed This Encounter  Procedures  . XR Foot Complete Left   No orders of the defined types were placed in this encounter.     Procedures: No procedures performed   Clinical Data: No additional findings.   Subjective: No chief complaint on file.   HPI  Review of Systems No current cardiac pulmonary GI GU issues  Objective: Vital Signs: There were no vitals taken for this visit.  Physical Exam Exam left foot patient continues to be exquisitely tender around the distal fourth metatarsal.  Some swelling but not too extreme.  No bruising.  Neurovascularly intact Ortho Exam  Specialty Comments:  No specialty comments available.  Imaging: No results found.   PMFS History: Patient Active Problem List   Diagnosis Date Noted  . Coronary atherosclerosis 02/13/2010  . CELLULITIS 06/18/2008  . GERD 12/25/2007  . CHRONIC OBSTRUCTIVE PULMONARY DISEASE, ACUTE EXACERBATION 11/16/2007  . Hypothyroidism 05/18/2007  . Dyslipidemia 05/18/2007  . COPD mixed type (HCC) 05/18/2007   Past Medical History:  Diagnosis Date  . CAD (coronary artery disease)   . CELLULITIS 06/18/2008  . CHF (congestive heart failure) (HCC)   . CHRONIC OBSTRUCTIVE PULMONARY DISEASE, ACUTE EXACERBATION 11/16/2007  . COPD 05/18/2007  .  GERD 12/25/2007  . HYPERLIPIDEMIA 05/18/2007  . HYPOTHYROIDISM 05/18/2007  . Macular degeneration   . Urosepsis     No family history on file.  Past Surgical History:  Procedure Laterality Date  . CATARACT EXTRACTION    . DILATION AND CURETTAGE OF UTERUS    . VARICOSE VEIN SURGERY     Social History   Occupational History  . Not on file  Tobacco Use  . Smoking status: Former Smoker    Last attempt to quit: 11/29/1985    Years since quitting: 32.6  . Smokeless tobacco: Never Used  Substance and Sexual Activity  . Alcohol use: No  . Drug use: No  . Sexual activity: Not on file

## 2018-07-26 ENCOUNTER — Other Ambulatory Visit: Payer: Self-pay | Admitting: Internal Medicine

## 2018-07-26 ENCOUNTER — Telehealth: Payer: Self-pay | Admitting: Internal Medicine

## 2018-07-26 ENCOUNTER — Ambulatory Visit (INDEPENDENT_AMBULATORY_CARE_PROVIDER_SITE_OTHER): Payer: Medicare Other

## 2018-07-26 DIAGNOSIS — J449 Chronic obstructive pulmonary disease, unspecified: Secondary | ICD-10-CM | POA: Diagnosis not present

## 2018-07-26 DIAGNOSIS — R05 Cough: Secondary | ICD-10-CM

## 2018-07-26 DIAGNOSIS — R059 Cough, unspecified: Secondary | ICD-10-CM

## 2018-07-26 MED ORDER — FLUTICASONE-SALMETEROL 500-50 MCG/DOSE IN AEPB: INHALATION_SPRAY | RESPIRATORY_TRACT | 6 refills | Status: DC

## 2018-07-26 NOTE — Telephone Encounter (Signed)
Medicine refilled. 

## 2018-07-26 NOTE — Telephone Encounter (Signed)
Patient came here today- is planning on establishing with Dr. Jerline Pain in Early October.  Patient has also stated she needs refill of ADVAIR DISKUS 500-50 MCG/DOSE AEPB [[id number]]  .  Please advise,

## 2018-07-27 NOTE — Progress Notes (Signed)
Please inform patient of the following:  The abnormalities on her previous xray have cleared. This was likely due to her infection she had at the time of the initial xray. We do not need to do any further testing at this point.   Holly [name]. Jerline Pain, MD 07/27/2018 8:14 PM

## 2018-08-09 ENCOUNTER — Ambulatory Visit (INDEPENDENT_AMBULATORY_CARE_PROVIDER_SITE_OTHER): Payer: Medicare Other | Admitting: Surgery

## 2018-08-20 ENCOUNTER — Other Ambulatory Visit: Payer: Self-pay | Admitting: Internal Medicine

## 2018-08-29 DIAGNOSIS — I639 Cerebral infarction, unspecified: Secondary | ICD-10-CM

## 2018-08-29 HISTORY — DX: Cerebral infarction, unspecified: I63.9

## 2018-08-30 ENCOUNTER — Inpatient Hospital Stay (HOSPITAL_COMMUNITY)
Admission: EM | Admit: 2018-08-30 | Discharge: 2018-09-01 | DRG: 065 | Disposition: A | Discharge status: Home Health | Payer: Medicare Other | Attending: Internal Medicine | Admitting: Internal Medicine

## 2018-08-30 ENCOUNTER — Other Ambulatory Visit: Payer: Self-pay

## 2018-08-30 ENCOUNTER — Ambulatory Visit: Payer: Self-pay

## 2018-08-30 ENCOUNTER — Emergency Department (HOSPITAL_COMMUNITY): Payer: Medicare Other

## 2018-08-30 ENCOUNTER — Encounter (HOSPITAL_COMMUNITY): Payer: Self-pay | Admitting: Emergency Medicine

## 2018-08-30 ENCOUNTER — Telehealth: Payer: Self-pay | Admitting: Family Medicine

## 2018-08-30 ENCOUNTER — Inpatient Hospital Stay (HOSPITAL_COMMUNITY): Payer: Medicare Other

## 2018-08-30 ENCOUNTER — Ambulatory Visit: Payer: Medicare Other | Admitting: Family Medicine

## 2018-08-30 DIAGNOSIS — K219 Gastro-esophageal reflux disease without esophagitis: Secondary | ICD-10-CM | POA: Diagnosis present

## 2018-08-30 DIAGNOSIS — I509 Heart failure, unspecified: Secondary | ICD-10-CM | POA: Diagnosis present

## 2018-08-30 DIAGNOSIS — Z880 Allergy status to penicillin: Secondary | ICD-10-CM | POA: Diagnosis not present

## 2018-08-30 DIAGNOSIS — Z79899 Other long term (current) drug therapy: Secondary | ICD-10-CM | POA: Diagnosis not present

## 2018-08-30 DIAGNOSIS — I4510 Unspecified right bundle-branch block: Secondary | ICD-10-CM | POA: Diagnosis present

## 2018-08-30 DIAGNOSIS — R4781 Slurred speech: Secondary | ICD-10-CM | POA: Diagnosis present

## 2018-08-30 DIAGNOSIS — R29810 Facial weakness: Secondary | ICD-10-CM | POA: Diagnosis present

## 2018-08-30 DIAGNOSIS — E785 Hyperlipidemia, unspecified: Secondary | ICD-10-CM | POA: Diagnosis present

## 2018-08-30 DIAGNOSIS — I6389 Other cerebral infarction: Secondary | ICD-10-CM | POA: Diagnosis not present

## 2018-08-30 DIAGNOSIS — Z8041 Family history of malignant neoplasm of ovary: Secondary | ICD-10-CM | POA: Diagnosis not present

## 2018-08-30 DIAGNOSIS — N183 Chronic kidney disease, stage 3 (moderate): Secondary | ICD-10-CM | POA: Diagnosis present

## 2018-08-30 DIAGNOSIS — I361 Nonrheumatic tricuspid (valve) insufficiency: Secondary | ICD-10-CM | POA: Diagnosis not present

## 2018-08-30 DIAGNOSIS — J449 Chronic obstructive pulmonary disease, unspecified: Secondary | ICD-10-CM | POA: Diagnosis not present

## 2018-08-30 DIAGNOSIS — E039 Hypothyroidism, unspecified: Secondary | ICD-10-CM | POA: Diagnosis not present

## 2018-08-30 DIAGNOSIS — R002 Palpitations: Secondary | ICD-10-CM | POA: Diagnosis not present

## 2018-08-30 DIAGNOSIS — R29700 NIHSS score 0: Secondary | ICD-10-CM | POA: Diagnosis present

## 2018-08-30 DIAGNOSIS — R4020 Unspecified coma: Secondary | ICD-10-CM | POA: Diagnosis not present

## 2018-08-30 DIAGNOSIS — I10 Essential (primary) hypertension: Secondary | ICD-10-CM | POA: Diagnosis not present

## 2018-08-30 DIAGNOSIS — I2510 Atherosclerotic heart disease of native coronary artery without angina pectoris: Secondary | ICD-10-CM | POA: Diagnosis present

## 2018-08-30 DIAGNOSIS — Z87891 Personal history of nicotine dependence: Secondary | ICD-10-CM | POA: Diagnosis not present

## 2018-08-30 DIAGNOSIS — Z79890 Hormone replacement therapy (postmenopausal): Secondary | ICD-10-CM

## 2018-08-30 DIAGNOSIS — R001 Bradycardia, unspecified: Secondary | ICD-10-CM | POA: Diagnosis present

## 2018-08-30 DIAGNOSIS — H3530 Unspecified macular degeneration: Secondary | ICD-10-CM | POA: Diagnosis present

## 2018-08-30 DIAGNOSIS — I480 Paroxysmal atrial fibrillation: Secondary | ICD-10-CM | POA: Diagnosis present

## 2018-08-30 DIAGNOSIS — I639 Cerebral infarction, unspecified: Secondary | ICD-10-CM | POA: Diagnosis present

## 2018-08-30 DIAGNOSIS — Z881 Allergy status to other antibiotic agents status: Secondary | ICD-10-CM

## 2018-08-30 DIAGNOSIS — R269 Unspecified abnormalities of gait and mobility: Secondary | ICD-10-CM | POA: Diagnosis present

## 2018-08-30 DIAGNOSIS — I671 Cerebral aneurysm, nonruptured: Secondary | ICD-10-CM | POA: Diagnosis present

## 2018-08-30 DIAGNOSIS — I6300 Cerebral infarction due to thrombosis of unspecified precerebral artery: Secondary | ICD-10-CM | POA: Diagnosis not present

## 2018-08-30 DIAGNOSIS — Z882 Allergy status to sulfonamides status: Secondary | ICD-10-CM | POA: Diagnosis not present

## 2018-08-30 DIAGNOSIS — I6340 Cerebral infarction due to embolism of unspecified cerebral artery: Secondary | ICD-10-CM | POA: Diagnosis not present

## 2018-08-30 DIAGNOSIS — I130 Hypertensive heart and chronic kidney disease with heart failure and stage 1 through stage 4 chronic kidney disease, or unspecified chronic kidney disease: Secondary | ICD-10-CM | POA: Diagnosis present

## 2018-08-30 DIAGNOSIS — Z885 Allergy status to narcotic agent status: Secondary | ICD-10-CM | POA: Diagnosis not present

## 2018-08-30 DIAGNOSIS — Z833 Family history of diabetes mellitus: Secondary | ICD-10-CM

## 2018-08-30 LAB — COMPREHENSIVE METABOLIC PANEL
ALT: 12 U/L (ref 0–44)
AST: 25 U/L (ref 15–41)
Albumin: 3.7 g/dL (ref 3.5–5.0)
Alkaline Phosphatase: 64 U/L (ref 38–126)
Anion gap: 8 (ref 5–15)
BUN: 22 mg/dL (ref 8–23)
CO2: 25 mmol/L (ref 22–32)
Calcium: 9.4 mg/dL (ref 8.9–10.3)
Chloride: 107 mmol/L (ref 98–111)
Creatinine, Ser: 1.12 mg/dL — ABNORMAL HIGH (ref 0.44–1.00)
GFR calc Af Amer: 49 mL/min — ABNORMAL LOW (ref >60)
GFR calc non Af Amer: 43 mL/min — ABNORMAL LOW (ref >60)
Glucose, Bld: 88 mg/dL (ref 70–99)
Potassium: 3.7 mmol/L (ref 3.5–5.1)
Sodium: 140 mmol/L (ref 135–145)
Total Bilirubin: 1.1 mg/dL (ref 0.3–1.2)
Total Protein: 6.6 g/dL (ref 6.5–8.1)

## 2018-08-30 LAB — DIFFERENTIAL
Abs Immature Granulocytes: 0 10*3/uL (ref 0.0–0.1)
Basophils Absolute: 0 10*3/uL (ref 0.0–0.1)
Basophils Relative: 1 %
Eosinophils Absolute: 0.2 10*3/uL (ref 0.0–0.7)
Eosinophils Relative: 4 %
Immature Granulocytes: 0 %
Lymphocytes Relative: 25 %
Lymphs Abs: 1.3 10*3/uL (ref 0.7–4.0)
Monocytes Absolute: 0.6 10*3/uL (ref 0.1–1.0)
Monocytes Relative: 11 %
Neutro Abs: 3.3 10*3/uL (ref 1.7–7.7)
Neutrophils Relative %: 59 %

## 2018-08-30 LAB — I-STAT CHEM 8, ED
BUN: 34 mg/dL — ABNORMAL HIGH (ref 8–23)
Calcium, Ion: 1.16 mmol/L (ref 1.15–1.40)
Chloride: 105 mmol/L (ref 98–111)
Creatinine, Ser: 1.2 mg/dL — ABNORMAL HIGH (ref 0.44–1.00)
Glucose, Bld: 81 mg/dL (ref 70–99)
HCT: 40 % (ref 36.0–46.0)
Hemoglobin: 13.6 g/dL (ref 12.0–15.0)
Potassium: 4.3 mmol/L (ref 3.5–5.1)
Sodium: 140 mmol/L (ref 135–145)
TCO2: 28 mmol/L (ref 22–32)

## 2018-08-30 LAB — CBC
HCT: 41 % (ref 36.0–46.0)
Hemoglobin: 12.6 g/dL (ref 12.0–15.0)
MCH: 30.9 pg (ref 26.0–34.0)
MCHC: 30.7 g/dL (ref 30.0–36.0)
MCV: 100.5 fL — ABNORMAL HIGH (ref 78.0–100.0)
Platelets: 231 10*3/uL (ref 150–400)
RBC: 4.08 MIL/uL (ref 3.87–5.11)
RDW: 15.2 % (ref 11.5–15.5)
WBC: 5.4 10*3/uL (ref 4.0–10.5)

## 2018-08-30 LAB — I-STAT TROPONIN, ED: Troponin i, poc: 0.01 ng/mL (ref 0.00–0.08)

## 2018-08-30 LAB — APTT: aPTT: 27 seconds (ref 24–36)

## 2018-08-30 LAB — PROTIME-INR
INR: 0.9
Prothrombin Time: 12.1 seconds (ref 11.4–15.2)

## 2018-08-30 MED ORDER — ACETAMINOPHEN 650 MG RE SUPP: 650.0000 mg | RECTAL | Status: DC | PRN

## 2018-08-30 MED ORDER — ENOXAPARIN SODIUM 40 MG/0.4ML ~~LOC~~ SOLN: 40.0000 mg | SUBCUTANEOUS | Status: DC

## 2018-08-30 MED ORDER — LORAZEPAM 2 MG/ML IJ SOLN
1.0000 mg | Freq: Once | INTRAMUSCULAR | Status: AC | PRN
  Administered 2018-08-30: 1 mg via INTRAVENOUS
  Filled 2018-08-30: qty 1

## 2018-08-30 MED ORDER — ACETAMINOPHEN 160 MG/5ML PO SOLN: 650.0000 mg | ORAL | Status: DC | PRN

## 2018-08-30 MED ORDER — ASPIRIN EC 81 MG PO TBEC
81.0000 mg | DELAYED_RELEASE_TABLET | Freq: Every day | ORAL | Status: DC
  Administered 2018-08-30 – 2018-09-01 (×3): 81 mg via ORAL
  Filled 2018-08-30 (×3): qty 1

## 2018-08-30 MED ORDER — ONDANSETRON HCL 4 MG/2ML IJ SOLN: 4.0000 mg | Freq: Four times a day (QID) | INTRAMUSCULAR | Status: DC | PRN

## 2018-08-30 MED ORDER — ONDANSETRON HCL 4 MG PO TABS: 4.0000 mg | ORAL_TABLET | Freq: Four times a day (QID) | ORAL | Status: DC | PRN

## 2018-08-30 MED ORDER — STROKE: EARLY STAGES OF RECOVERY BOOK
Freq: Once | Status: AC
  Administered 2018-08-30
  Filled 2018-08-30: qty 1

## 2018-08-30 MED ORDER — ENOXAPARIN SODIUM 30 MG/0.3ML ~~LOC~~ SOLN
30.0000 mg | SUBCUTANEOUS | Status: DC
  Administered 2018-08-31 – 2018-09-01 (×2): 30 mg via SUBCUTANEOUS
  Filled 2018-08-30 (×2): qty 0.3

## 2018-08-30 MED ORDER — ACETAMINOPHEN 325 MG PO TABS: 650.0000 mg | ORAL_TABLET | ORAL | Status: DC | PRN

## 2018-08-30 NOTE — Telephone Encounter (Signed)
Called and spoke to patient who then placed me on the phone with her son Holly [name] because she did not understand. I explained that with her signs and symptoms it is recommended that she go straight to the Emergency Department as Holly [name]. Son stated understanding and agreed with disposition.

## 2018-08-30 NOTE — H&P (Signed)
History and Physical    [name] HYQ:[id number] DOB: Apr 23, 1928 DOA: 08/30/2018  PCP: Gordy Savers, MD   Patient coming from: Home.  I have personally briefly reviewed patient's old medical records in [facility]  Chief Complaint: Left face drooling.  HPI: Holly [name] is a 84 y.o. female with medical history significant of CAD, cellulitis, unspecified CHF COPD, GERD, hyperlipidemia, hypothyroidism, macular degeneration, history of urosepsis who is brought by her daughter after she had a facial drooling yesterday around 32.  Per patient's daughter, yesterday her brother found her drooling on her left side of her face.  Patient stated that she was slobbering.  However, they did not call 911 and symptoms subsided.  They called her PCP today who asked her to bring her to the emergency department.  The patient also states that 2 days ago she had palpitations with a burning feeling in her neck.  She gets palpitations on occasions.  She denies chest pain, dyspnea, diaphoresis, PND, orthopnea, but states she occasionally gets lower extremity edema.  Denies fever, chills, sore throat, wheezing, hemoptysis, abdominal pain, nausea, emesis, diarrhea, constipation, melena or hematochezia.  No dysuria, frequency or hematuria.  No polyuria, polydipsia or polyphagia.  ED Course: Initial vital signs temperature 97.8 F, pulse 66, respirations 16, blood pressure 131/85 mmHg and O2 sat 94% on room air.  CBC showed an MCV of 100.5, but was otherwise normal.  PT INR and PTT were within normal limits.  Her CMP showed a creatinine of 1.12 mg/dL.  All other values were normal.  Imaging: CT head did not show any acute intracranial abnormality.  However MRI of brain showed 2 small acute/subacute cortical infarcts involving the right insular cortex and right parietal lobe.  There was moderate atrophy and diffuse white matter disease also present.  Please see images and full radiology report for further  detail.  Review of Systems: As per HPI otherwise 10 point review of systems negative.   Past Medical History:  Diagnosis Date  . CAD (coronary artery disease)   . CELLULITIS 06/18/2008  . CHF (congestive heart failure) (HCC)   . CHRONIC OBSTRUCTIVE PULMONARY DISEASE, ACUTE EXACERBATION 11/16/2007  . COPD 05/18/2007  . GERD 12/25/2007  . HYPERLIPIDEMIA 05/18/2007  . HYPOTHYROIDISM 05/18/2007  . Macular degeneration   . Urosepsis     Past Surgical History:  Procedure Laterality Date  . CATARACT EXTRACTION    . DILATION AND CURETTAGE OF UTERUS    . VARICOSE VEIN SURGERY       reports that she quit smoking about 32 years ago. She has never used smokeless tobacco. She reports that she does not drink alcohol or use drugs.  Allergies  Allergen Reactions  . Levofloxacin     REACTION: unspecified  . Macrobid [Nitrofurantoin Macrocrystal] Nausea Only  . Sulfamethoxazole     REACTION: unspecified  . Tramadol Other (See Comments)    Shakes   . Amoxicillin Rash    Has patient had a PCN reaction causing immediate rash, facial/tongue/throat swelling, SOB or lightheadedness with hypotension: No Has patient had a PCN reaction causing severe rash involving mucus membranes or skin necrosis: No Has patient had a PCN reaction that required hospitalization: No Has patient had a PCN reaction occurring within the last 10 years: No If all of the above answers are "NO", then may proceed with Cephalosporin use.   Made "bottom area" very raw    Family History  Problem Relation Age of Onset  . Ovarian  cancer Mother   . Diabetes Mellitus II Brother    Prior to Admission medications   Medication Sig Start Date End Date Taking? Authorizing Provider  albuterol (PROVENTIL HFA) 108 (90 Base) MCG/ACT inhaler Inhale 2 puffs into the lungs every 6 (six) hours as needed. Patient taking differently: Inhale 1-2 puffs into the lungs every 6 (six) hours as needed for wheezing or shortness of breath.  12/06/17   Yes Gordy Savers, MD  atorvastatin (LIPITOR) 10 MG tablet TAKE 1 TABLET BY MOUTH EVERY DAY Patient taking differently: Take 10 mg by mouth daily at 6 PM.  08/21/18  Yes Roderick Pee, MD  Fluticasone-Salmeterol [facility] IN[facility]) 500-50 MCG/DOSE AEPB Inhale 1 puff into the lungs 2 (two) times daily.   Yes [provider]  levothyroxine (SYNTHROID, LEVOTHROID) 75 MCG tablet TAKE 1 TABLET BY MOUTH EVERY DAY Patient taking differently: Take 75 mcg by mouth daily before breakfast.  08/21/18  Yes Roderick Pee, MD  Multiple Vitamins-Minerals (ICAPS PO) Take 1 capsule by mouth 2 (two) times daily.   Yes [provider]  Fluticasone-Salmeterol (ADVAIR DISKUS) 500-50 MCG/DOSE AEPB INHALE 1 PUFF INTO THE LUNGS EVERY 12 HOURS. Patient not taking: Reported on 08/30/2018 07/26/18   Gordy Savers, MD    Physical Exam: Vitals:   08/30/18 1600 08/30/18 1749 08/30/18 1800 08/30/18 1830  BP: (!) 163/68 140/63 (!) 143/64 (!) 149/91  Pulse: 68 68 69 62  Resp:   16   Temp:      TempSrc:      SpO2: 99% 100% 100% 99%  Weight:      Height:        Constitutional: NAD, calm, comfortable Eyes: PERRL, lids and conjunctivae normal ENMT: Mucous membranes are moist. Posterior pharynx clear of any exudate or lesions. Neck: normal, supple, no masses, no thyromegaly Respiratory: clear to auscultation bilaterally, no wheezing, no crackles. Normal respiratory effort. No accessory muscle use.  Cardiovascular: Regular rate and rhythm, no murmurs / rubs / gallops. No extremity edema. 2+ pedal pulses. No carotid bruits.  Abdomen: Soft, no tenderness, no masses palpated. No hepatosplenomegaly. Bowel sounds positive.  Musculoskeletal: no clubbing / cyanosis.  Multiple deformities of MCP joints. Good ROM, no contractures. Normal muscle tone.  Skin: no rashes, lesions, ulcers. No induration Neurologic: Mild left facial droop, otherwise CN 2-12 grossly intact. Sensation intact, DTR normal. Strength  5/5 in all 4.  Psychiatric: Normal judgment and insight. Alert and oriented x 3. Normal mood.   Labs on Admission: I have personally reviewed following labs and imaging studies  CBC: Recent Labs  Lab 08/30/18 1222 08/30/18 1251  WBC 5.4  --   NEUTROABS 3.3  --   HGB 12.6 13.6  HCT 41.0 40.0  MCV 100.5*  --   PLT 231  --    Basic Metabolic Panel: Recent Labs  Lab 08/30/18 1222 08/30/18 1251  NA 140 140  K 3.7 4.3  CL 107 105  CO2 25  --   GLUCOSE 88 81  BUN 22 34*  CREATININE 1.12* 1.20*  CALCIUM 9.4  --    GFR: Estimated Creatinine Clearance: 25.2 mL/min (A) (by C-G formula based on SCr of 1.2 mg/dL (H)). Liver Function Tests: Recent Labs  Lab 08/30/18 1222  AST 25  ALT 12  ALKPHOS 64  BILITOT 1.1  PROT 6.6  ALBUMIN 3.7   No results for input(s): LIPASE, AMYLASE in the last 168 hours. No results for input(s): AMMONIA in the last 168 hours.  Coagulation Profile: Recent Labs  Lab 08/30/18 1222  INR 0.90   Cardiac Enzymes: No results for input(s): CKTOTAL, CKMB, CKMBINDEX, TROPONINI in the last 168 hours. BNP (last 3 results) No results for input(s): PROBNP in the last 8760 hours. HbA1C: No results for input(s): HGBA1C in the last 72 hours. CBG: No results for input(s): GLUCAP in the last 168 hours. Lipid Profile: No results for input(s): CHOL, HDL, LDLCALC, TRIG, CHOLHDL, LDLDIRECT in the last 72 hours. Thyroid Function Tests: No results for input(s): TSH, T4TOTAL, FREET4, T3FREE, THYROIDAB in the last 72 hours. Anemia Panel: No results for input(s): VITAMINB12, FOLATE, FERRITIN, TIBC, IRON, RETICCTPCT in the last 72 hours. Urine analysis:    Component Value Date/Time   COLORURINE AMBER (A) 08/27/2011 1257   APPEARANCEUR CLOUDY (A) 08/27/2011 1257   LABSPEC 1.026 08/27/2011 1257   PHURINE 6.0 08/27/2011 1257   GLUCOSEU NEGATIVE 08/27/2011 1257   HGBUR NEGATIVE 08/27/2011 1257   HGBUR trace-lysed 12/15/2007 0858   BILIRUBINUR n 09/03/2013 1409     KETONESUR 15 (A) 08/27/2011 1257   PROTEINUR 2+ 09/03/2013 1409   PROTEINUR 30 (A) 08/27/2011 1257   UROBILINOGEN negative 09/03/2013 1409   UROBILINOGEN 1.0 08/27/2011 1257   NITRITE n 09/03/2013 1409   NITRITE NEGATIVE 08/27/2011 1257   LEUKOCYTESUR moderate (2+) 09/03/2013 1409    Radiological Exams on Admission: Ct Head Wo Contrast  Result Date: 08/30/2018 CLINICAL DATA:  Left leg weakness, slurred speech. EXAM: CT HEAD WITHOUT CONTRAST TECHNIQUE: Contiguous axial images were obtained from the base of the skull through the vertex without intravenous contrast. COMPARISON:  None. FINDINGS: Brain: Mild diffuse cortical atrophy is noted. Mild chronic ischemic white matter disease is noted. No mass effect or midline shift is noted. Ventricular size is within normal limits. There is no evidence of mass lesion, hemorrhage or acute infarction. Vascular: No hyperdense vessel or unexpected calcification. Skull: Normal. Negative for fracture or focal lesion. Sinuses/Orbits: Left sphenoid sinusitis is noted. Other: None. IMPRESSION: Mild diffuse cortical atrophy. Mild chronic ischemic white matter disease. Left sphenoid sinusitis. No acute intracranial abnormality seen. Electronically Signed   By: Lupita Raider, M.D.   On: 08/30/2018 13:08   Mr Brain Wo Contrast  Result Date: 08/30/2018 CLINICAL DATA:  Focal neuro deficit for greater than 6 hours, stroke suspected. Altered level of consciousness. Abnormal speech. Last seen normal at 6 p.m. yesterday. Possible left-sided facial droop. EXAM: MRI HEAD WITHOUT CONTRAST TECHNIQUE: Multiplanar, multiecho pulse sequences of the brain and surrounding structures were obtained without intravenous contrast. COMPARISON:  CT head without contrast 08/30/2018 FINDINGS: Brain: An acute nonhemorrhagic infarct is present posteriorly in the right insular cortex. There is also an acute cortical infarct in the right parietal lobe. No additional infarcts are present. T2  signal changes are present in the right insular cortex. There is no definite T2 change in the right parietal infarct. Moderate atrophy is present. Moderate diffuse confluent periventricular and subcortical T2 hyperintensities are present bilaterally. The ventricles are proportionate to the degree of atrophy. The brainstem is within normal limits.  Cerebellum is unremarkable. Vascular: Flow is present in the major intracranial arteries. Skull and upper cervical spine: The skull base is within normal limits. Craniocervical junction is intact. Central canal narrowing is present at C3-4 due to endplate changes. Marrow signal is normal. Sinuses/Orbits: The paranasal sinuses and mastoid air cells are clear. Bilateral lens replacements are present. Globes and orbits are otherwise within normal limits. IMPRESSION: 1. Two small acute/subacute cortical infarcts involving the  right insular cortex and right parietal lobe. 2. Moderate atrophy and diffuse white matter disease is also present. This likely reflects the sequela of chronic microvascular ischemia. Electronically Signed   By: Marin Roberts M.D.   On: 08/30/2018 17:37    EKG: Independently reviewed.  Vent. rate 64 BPM PR interval 150 ms QRS duration 116 ms QT/QTc 414/427 ms P-R-T axes 70 -56 42 Normal sinus rhythm Left axis deviation Right bundle branch block Abnormal ECG Not significantly changed from previous.  Assessment/Plan Principal Problem:   CVA (cerebral vascular accident) (HCC) Admit to telemetry/inpatient. Frequent neuro checks. PT/OT/SLP. Check fasting lipids and hemoglobin A1c. Check carotid Doppler and echocardiogram. Check MRA of brain. Stroke team is on board.  Active Problems:   Hypothyroidism Continue levothyroxine 75 mcg p.o. daily. Check TSH level.    Dyslipidemia On atorvastatin 10 mg p.o. daily. Check lipid panel in a.m.    Coronary atherosclerosis Continue aspirin and statin.    COPD mixed type  (HCC) Continue Advair was formulary equivalent. Supplemental oxygen and bronchodilators as needed.    GERD Protonix 40 mg p.o. daily.   DVT prophylaxis: Lovenox SQ. Code Status: Full code. Family Communication: Her daughter Steward Drone, who is a Engineer, civil (consulting), was present in the ED. Disposition Plan: Admit for CVA work-up. Consults called: Neuro hospitalist service. Admission status: Inpatient/telemetry.   Bobette Mo MD [facility] Pager [phone].  If 7PM-7AM, please contact night-coverage [email or web address] Password [facility]  08/30/2018, 7:24 PM   This document was prepared using Dragon voice recognition software and may contain some unintended transcription errors.

## 2018-08-30 NOTE — Telephone Encounter (Signed)
Call placed to son Cecilie Lowers. Triage of symptoms done. Pt will be see in ED today. Cecilie Lowers received call from PCP and has scheduled CT per PCP.  Reason for Disposition . [1] Loss of speech or garbled speech AND [2] sudden onset AND [3] brief (now gone)  Answer Assessment - Initial Assessment Questions 1. SYMPTOM: "What is the main symptom you are concerned about?" (e.g., weakness, numbness)     Slurred speech, left leg weekness 2. ONSET: "When did this start?" (minutes, hours, days; while sleeping)     Yesterday evening 3. LAST NORMAL: "When was the last time you were normal (no symptoms)?"     now 4. PATTERN "Does this come and go, or has it been constant since it started?"  "Is it present now?"     No problems today 5. CARDIAC SYMPTOMS: "Have you had any of the following symptoms: chest pain, difficulty breathing, palpitations?"      Night before symptoms of palpatations 6. NEUROLOGIC SYMPTOMS: "Have you had any of the following symptoms: headache, dizziness, vision loss, double vision, changes in speech, unsteady on your feet?"     No symptoms today 7. OTHER SYMPTOMS: "Do you have any other symptoms?"     none 8. PREGNANCY: "Is there any chance you are pregnant?" "When was your last menstrual period?"     n/a  Protocols used: NEUROLOGIC DEFICIT-A-AH

## 2018-08-30 NOTE — ED Notes (Signed)
Patient transported to MRI 

## 2018-08-30 NOTE — ED Provider Notes (Signed)
[place] EMERGENCY DEPARTMENT Provider Note   CSN: [id number] Arrival date & time: 08/30/18  1127     History   Chief Complaint Chief Complaint  Patient presents with  . Facial Droop    HPI Holly [name] is a 84 y.o. female hx of CAD, CHF, COPD, HL, HTN, here presenting with possible facial droop, trouble speaking, trouble with balance.  Last normal was 6 PM yesterday.  Daughter is a Marine scientist and was eating with her and states that she was fine.  Son came home around 7 PM and noticed that she may have a left facial droop as well as some trouble walking and trouble speaking.  Her daughter then came back and evaluated her and apparently she was at baseline so patient used to come to the ED.  Patient continues to feel fine and denies any trouble speaking or trouble walking.  Patient states that she thinks that it is her dentures that made her face looked abnormal.  She has no history of strokes and had a NIH of 0 and VAN negative in triage.  The history is provided by the patient.    Past Medical History:  Diagnosis Date  . CAD (coronary artery disease)   . CELLULITIS 06/18/2008  . CHF (congestive heart failure) ([place])   . CHRONIC OBSTRUCTIVE PULMONARY DISEASE, ACUTE EXACERBATION 11/16/2007  . COPD 05/18/2007  . GERD 12/25/2007  . HYPERLIPIDEMIA 05/18/2007  . HYPOTHYROIDISM 05/18/2007  . Macular degeneration   . Urosepsis     Patient Active Problem List   Diagnosis Date Noted  . Coronary atherosclerosis 02/13/2010  . CELLULITIS 06/18/2008  . GERD 12/25/2007  . CHRONIC OBSTRUCTIVE PULMONARY DISEASE, ACUTE EXACERBATION 11/16/2007  . Hypothyroidism 05/18/2007  . Dyslipidemia 05/18/2007  . COPD mixed type ([place]) 05/18/2007    Past Surgical History:  Procedure Laterality Date  . CATARACT EXTRACTION    . DILATION AND CURETTAGE OF UTERUS    . VARICOSE VEIN SURGERY       OB History   None      Home Medications    Prior to Admission medications     Medication Sig Start Date End Date Taking? Authorizing Provider  albuterol (PROVENTIL HFA) 108 (90 Base) MCG/ACT inhaler Inhale 2 puffs into the lungs every 6 (six) hours as needed. 12/06/17   Marletta Lor, MD  atorvastatin (LIPITOR) 10 MG tablet TAKE 1 TABLET BY MOUTH EVERY DAY 08/21/18   Dorena Cookey, MD  Fluticasone-Salmeterol (ADVAIR DISKUS) 500-50 MCG/DOSE AEPB INHALE 1 PUFF INTO THE LUNGS EVERY 12 HOURS. 07/26/18   Marletta Lor, MD  levothyroxine (SYNTHROID, LEVOTHROID) 75 MCG tablet TAKE 1 TABLET BY MOUTH EVERY DAY 08/21/18   Dorena Cookey, MD    Family History No family history on file.  Social History Social History   Tobacco Use  . Smoking status: Former Smoker    Last attempt to quit: 11/29/1985    Years since quitting: 32.7  . Smokeless tobacco: Never Used  Substance Use Topics  . Alcohol use: No  . Drug use: No     Allergies   Levofloxacin; Macrobid [nitrofurantoin macrocrystal]; Sulfamethoxazole; and Tramadol   Review of Systems Review of Systems  Neurological: Positive for facial asymmetry.  All other systems reviewed and are negative.    Physical Exam Updated Vital Signs BP (!) 156/99   Pulse 66   Temp 97.8 F (36.6 C) (Oral)   Resp 16   Ht 5\' 2"  (1.575 m)  Wt 49.3 kg   SpO2 99%   BMI 19.88 kg/m   Physical Exam  Constitutional: She is oriented to person, place, and time. She appears well-developed.  HENT:  Head: Normocephalic.  Mouth/Throat: Oropharynx is clear and moist.  No obvious facial droop. Has partial dentures.   Eyes: Pupils are equal, round, and reactive to light. Conjunctivae and EOM are normal.  Neck: Normal range of motion. Neck supple.  Cardiovascular: Normal rate, regular rhythm and normal heart sounds.  Pulmonary/Chest: Effort normal and breath sounds normal. No stridor. No respiratory distress. She has no wheezes.  Abdominal: Soft. Bowel sounds are normal. She exhibits no distension. There is no tenderness.  There is no guarding.  Musculoskeletal: Normal range of motion.  Neurological: She is alert and oriented to person, place, and time.  CN 2- 12 intact. Nl strength and sensation throughout. Nl finger to nose. Nl gait   Skin: Skin is warm. Capillary refill takes less than 2 seconds.  Psychiatric: She has a normal mood and affect.  Nursing note and vitals reviewed.    ED Treatments / Results  Labs (all labs ordered are listed, but only abnormal results are displayed) Labs Reviewed  CBC - Abnormal; Notable for the following components:      Result Value   MCV 100.5 (*)    All other components within normal limits  COMPREHENSIVE METABOLIC PANEL - Abnormal; Notable for the following components:   Creatinine, Ser 1.12 (*)    GFR calc non Af Amer 43 (*)    GFR calc Af Amer 49 (*)    All other components within normal limits  I-STAT CHEM 8, ED - Abnormal; Notable for the following components:   BUN 34 (*)    Creatinine, Ser 1.20 (*)    All other components within normal limits  PROTIME-INR  APTT  DIFFERENTIAL  I-STAT TROPONIN, ED  CBG MONITORING, ED    EKG EKG Interpretation  Date/Time:  Wednesday August 30 2018 12:29:11 EDT Ventricular Rate:  64 PR Interval:  150 QRS Duration: 116 QT Interval:  414 QTC Calculation: 427 R Axis:   -56 Text Interpretation:  Normal sinus rhythm Left axis deviation Right bundle branch block Abnormal ECG RBBB new since 2004 Confirmed by Wandra Arthurs [phone]) on 08/30/2018 3:27:05 PM   Radiology Ct Head Wo Contrast  Result Date: 08/30/2018 CLINICAL DATA:  Left leg weakness, slurred speech. EXAM: CT HEAD WITHOUT CONTRAST TECHNIQUE: Contiguous axial images were obtained from the base of the skull through the vertex without intravenous contrast. COMPARISON:  None. FINDINGS: Brain: Mild diffuse cortical atrophy is noted. Mild chronic ischemic white matter disease is noted. No mass effect or midline shift is noted. Ventricular size is within normal  limits. There is no evidence of mass lesion, hemorrhage or acute infarction. Vascular: No hyperdense vessel or unexpected calcification. Skull: Normal. Negative for fracture or focal lesion. Sinuses/Orbits: Left sphenoid sinusitis is noted. Other: None. IMPRESSION: Mild diffuse cortical atrophy. Mild chronic ischemic white matter disease. Left sphenoid sinusitis. No acute intracranial abnormality seen. Electronically Signed   By: Marijo Conception, M.D.   On: 08/30/2018 13:08    Procedures Procedures (including critical care time)  Medications Ordered in ED Medications  LORazepam (ATIVAN) injection 1 mg (has no administration in time range)     Initial Impression / Assessment and Plan / ED Course  I have reviewed the triage vital signs and the nursing notes.  Pertinent labs & imaging results that were available during my  care of the patient were reviewed by me and considered in my medical decision making (see chart for details).    Holly [name] is a 84 y.o. female here with possible L facial droop and trouble speaking and trouble walking. She has no facial droop currently and has normal neuro exam. Likely TIA vs small stroke. Will get labs, CT head. Will consult neurology.   2:40 pm I talked to Dr. Leonel Ramsay from neurology. CT head unremarkable. He recommend MRI brain. If MRI showed stroke, then will need admission and patient will be discharged. Otherwise, she can be discharged  3:49 PM Labs unremarkable. MRI brain pending. Signed out to Dr. Regenia Skeeter in the ED to follow up MRI.    Final Clinical Impressions(s) / ED Diagnoses   Final diagnoses:  None    ED Discharge Orders    None       Drenda Freeze, MD 08/30/18 1549

## 2018-08-30 NOTE — Telephone Encounter (Signed)
Pt son calling about his mother S/S of stroke last night. Slurred speech drooping mouth. Son is not listed on DPR . He says the symptom are gone this am Pt was made an appointment with provider for further evaluation. Son advised to call 911 if symptoms return. He will address DPR

## 2018-08-30 NOTE — ED Notes (Signed)
Pt ambulated to bathroom with daughter

## 2018-08-30 NOTE — ED Triage Notes (Signed)
Pt and daughter at bedside. Daughter states yesterday 10/1 at 530 pm she noticed some left side facial drooping. They did not call 911 but called PCP who said to come to the ER today. Pt stating "Let me tell you, my dentures are loose bc the polygrip slips so that might have scared them a little bit bc I was slobbering." Pt ambulatory. NIH 0 and van negative at triage.

## 2018-08-30 NOTE — ED Provider Notes (Signed)
Care transferred to me. MRI shows subacute infarcts. Will consult neuro and admit to hospitalist service.   Results for orders placed or performed during the hospital encounter of 08/30/18  Protime-INR  Result Value Ref Range   Prothrombin Time 12.1 11.4 - 15.2 seconds   INR 0.90   APTT  Result Value Ref Range   aPTT 27 24 - 36 seconds  CBC  Result Value Ref Range   WBC 5.4 4.0 - 10.5 K/uL   RBC 4.08 3.87 - 5.11 MIL/uL   Hemoglobin 12.6 12.0 - 15.0 g/dL   HCT 41.0 36.0 - 46.0 %   MCV 100.5 (H) 78.0 - 100.0 fL   MCH 30.9 26.0 - 34.0 pg   MCHC 30.7 30.0 - 36.0 g/dL   RDW 15.2 11.5 - 15.5 %   Platelets 231 150 - 400 K/uL  Differential  Result Value Ref Range   Neutrophils Relative % 59 %   Neutro Abs 3.3 1.7 - 7.7 K/uL   Lymphocytes Relative 25 %   Lymphs Abs 1.3 0.7 - 4.0 K/uL   Monocytes Relative 11 %   Monocytes Absolute 0.6 0.1 - 1.0 K/uL   Eosinophils Relative 4 %   Eosinophils Absolute 0.2 0.0 - 0.7 K/uL   Basophils Relative 1 %   Basophils Absolute 0.0 0.0 - 0.1 K/uL   Immature Granulocytes 0 %   Abs Immature Granulocytes 0.0 0.0 - 0.1 K/uL  Comprehensive metabolic panel  Result Value Ref Range   Sodium 140 135 - 145 mmol/L   Potassium 3.7 3.5 - 5.1 mmol/L   Chloride 107 98 - 111 mmol/L   CO2 25 22 - 32 mmol/L   Glucose, Bld 88 70 - 99 mg/dL   BUN 22 8 - 23 mg/dL   Creatinine, Ser 1.12 (H) 0.44 - 1.00 mg/dL   Calcium 9.4 8.9 - 10.3 mg/dL   Total Protein 6.6 6.5 - 8.1 g/dL   Albumin 3.7 3.5 - 5.0 g/dL   AST 25 15 - 41 U/L   ALT 12 0 - 44 U/L   Alkaline Phosphatase 64 38 - 126 U/L   Total Bilirubin 1.1 0.3 - 1.2 mg/dL   GFR calc non Af Amer 43 (L) >60 mL/min   GFR calc Af Amer 49 (L) >60 mL/min   Anion gap 8 5 - 15  I-stat troponin, ED  Result Value Ref Range   Troponin i, poc 0.01 0.00 - 0.08 ng/mL   Comment 3          I-Stat Chem 8, ED  Result Value Ref Range   Sodium 140 135 - 145 mmol/L   Potassium 4.3 3.5 - 5.1 mmol/L   Chloride 105 98 - 111  mmol/L   BUN 34 (H) 8 - 23 mg/dL   Creatinine, Ser 1.20 (H) 0.44 - 1.00 mg/dL   Glucose, Bld 81 70 - 99 mg/dL   Calcium, Ion 1.16 1.15 - 1.40 mmol/L   TCO2 28 22 - 32 mmol/L   Hemoglobin 13.6 12.0 - 15.0 g/dL   HCT 40.0 36.0 - 46.0 %   Ct Head Wo Contrast  Result Date: 08/30/2018 CLINICAL DATA:  Left leg weakness, slurred speech. EXAM: CT HEAD WITHOUT CONTRAST TECHNIQUE: Contiguous axial images were obtained from the base of the skull through the vertex without intravenous contrast. COMPARISON:  None. FINDINGS: Brain: Mild diffuse cortical atrophy is noted. Mild chronic ischemic white matter disease is noted. No mass effect or midline shift is noted. Ventricular size is within  normal limits. There is no evidence of mass lesion, hemorrhage or acute infarction. Vascular: No hyperdense vessel or unexpected calcification. Skull: Normal. Negative for fracture or focal lesion. Sinuses/Orbits: Left sphenoid sinusitis is noted. Other: None. IMPRESSION: Mild diffuse cortical atrophy. Mild chronic ischemic white matter disease. Left sphenoid sinusitis. No acute intracranial abnormality seen. Electronically Signed   By: Marijo Conception, M.D.   On: 08/30/2018 13:08   Mr Brain Wo Contrast  Result Date: 08/30/2018 CLINICAL DATA:  Focal neuro deficit for greater than 6 hours, stroke suspected. Altered level of consciousness. Abnormal speech. Last seen normal at 6 p.m. yesterday. Possible left-sided facial droop. EXAM: MRI HEAD WITHOUT CONTRAST TECHNIQUE: Multiplanar, multiecho pulse sequences of the brain and surrounding structures were obtained without intravenous contrast. COMPARISON:  CT head without contrast 08/30/2018 FINDINGS: Brain: An acute nonhemorrhagic infarct is present posteriorly in the right insular cortex. There is also an acute cortical infarct in the right parietal lobe. No additional infarcts are present. T2 signal changes are present in the right insular cortex. There is no definite T2 change in  the right parietal infarct. Moderate atrophy is present. Moderate diffuse confluent periventricular and subcortical T2 hyperintensities are present bilaterally. The ventricles are proportionate to the degree of atrophy. The brainstem is within normal limits.  Cerebellum is unremarkable. Vascular: Flow is present in the major intracranial arteries. Skull and upper cervical spine: The skull base is within normal limits. Craniocervical junction is intact. Central canal narrowing is present at C3-4 due to endplate changes. Marrow signal is normal. Sinuses/Orbits: The paranasal sinuses and mastoid air cells are clear. Bilateral lens replacements are present. Globes and orbits are otherwise within normal limits. IMPRESSION: 1. Two small acute/subacute cortical infarcts involving the right insular cortex and right parietal lobe. 2. Moderate atrophy and diffuse white matter disease is also present. This likely reflects the sequela of chronic microvascular ischemia. Electronically Signed   By: San Morelle M.D.   On: 08/30/2018 17:37      Sherwood Gambler, MD 08/30/18 1840

## 2018-08-30 NOTE — Consult Note (Signed)
Requesting Physician: Dr. Olevia Bowens    Chief Complaint:   History obtained from: Patient and Chart     HPI:                                                                                                                                       Holly [name] is an 84 y.o. female  with PMH of CHF, CAD, hypthyorodism, HLD presents with transient episode of slurred speech and gait instability that occurred around 6 PM yesterday evening witnessed by her son.  Patient's son insisted she come to the hospital and evaluated today in the ER.  MRI brain shows small acute infarcts in the right parietal lobe and insula.  She was admitted for stroke work-up and neurology was consulted.  Patient not on ASA due to advice from her ophthalmologist for macular degeneration?  She also states that she had some palpitations/quivering in her neck prior to this episode.  Date last known well: 10.1.19 Time last known well: 6pm tPA Given: no, outside window NIHSS: 0 Baseline MRS 0      Past Medical History:  Diagnosis Date  . CAD (coronary artery disease)   . CELLULITIS 06/18/2008  . CHF (congestive heart failure) ([place])   . CHRONIC OBSTRUCTIVE PULMONARY DISEASE, ACUTE EXACERBATION 11/16/2007  . COPD 05/18/2007  . GERD 12/25/2007  . HYPERLIPIDEMIA 05/18/2007  . HYPOTHYROIDISM 05/18/2007  . Macular degeneration   . Urosepsis     Past Surgical History:  Procedure Laterality Date  . CATARACT EXTRACTION    . DILATION AND CURETTAGE OF UTERUS    . VARICOSE VEIN SURGERY      Family History  Problem Relation Age of Onset  . Ovarian cancer Mother   . Diabetes Mellitus II Brother    Social History:  reports that she quit smoking about 32 years ago. She has never used smokeless tobacco. She reports that she does not drink alcohol or use drugs.  Allergies:  Allergies  Allergen Reactions  . Levofloxacin     REACTION: unspecified  . Macrobid [Nitrofurantoin Macrocrystal] Nausea Only  . Sulfamethoxazole    REACTION: unspecified  . Tramadol Other (See Comments)    Shakes   . Amoxicillin Rash    Has patient had a PCN reaction causing immediate rash, facial/tongue/throat swelling, SOB or lightheadedness with hypotension: No Has patient had a PCN reaction causing severe rash involving mucus membranes or skin necrosis: No Has patient had a PCN reaction that required hospitalization: No Has patient had a PCN reaction occurring within the last 10 years: No If all of the above answers are "NO", then may proceed with Cephalosporin use.   Made "bottom area" very raw    Medications:  I reviewed home medications   ROS:                                                                                                                                     14 systems reviewed and negative except above    Examination:                                                                                                      General: Appears well-developed . Psych: Affect appropriate to situation Eyes: No scleral injection HENT: No OP obstrucion Head: Normocephalic.  Cardiovascular: Normal rate and regular rhythm.  Respiratory: Effort normal and breath sounds normal to anterior ascultation GI: Soft.  No distension. There is no tenderness.  Skin: WDI    Neurological Examination Mental Status: Alert, oriented, thought content appropriate.  Speech fluent without evidence of aphasia. Able to follow 3 step commands without difficulty. Cranial Nerves: II: Visual fields grossly normal,  III,IV, VI: ptosis not present, extra-ocular motions intact bilaterally, pupils equal, round, reactive to light and accommodation V,VII: smile symmetric, facial light touch sensation normal bilaterally VIII: hearing normal bilaterally IX,X: uvula rises symmetrically XI: bilateral shoulder shrug XII: midline  tongue extension Motor: Right : Upper extremity   5/5    Left:     Upper extremity   5/5  Lower extremity   5/5     Lower extremity   5/5 Tone and bulk:normal tone throughout; no atrophy noted Sensory: Pinprick and light touch intact throughout, bilaterally Deep Tendon Reflexes:  symmetric throughout Plantars: Right: downgoing   Left: downgoing Cerebellar: normal finger-to-nose, normal rapid alternating movements and normal heel-to-shin test Gait: normal gait and station     Lab Results: Basic Metabolic Panel: Recent Labs  Lab 08/30/18 1222 08/30/18 1251  NA 140 140  K 3.7 4.3  CL 107 105  CO2 25  --   GLUCOSE 88 81  BUN 22 34*  CREATININE 1.12* 1.20*  CALCIUM 9.4  --     CBC: Recent Labs  Lab 08/30/18 1222 08/30/18 1251  WBC 5.4  --   NEUTROABS 3.3  --   HGB 12.6 13.6  HCT 41.0 40.0  MCV 100.5*  --   PLT 231  --     Coagulation Studies: Recent Labs    08/30/18 1222  LABPROT 12.1  INR 0.90    Imaging: Ct Head Wo Contrast  Result Date: 08/30/2018 CLINICAL DATA:  Left leg weakness, slurred speech. EXAM: CT HEAD WITHOUT CONTRAST TECHNIQUE:  Contiguous axial images were obtained from the base of the skull through the vertex without intravenous contrast. COMPARISON:  None. FINDINGS: Brain: Mild diffuse cortical atrophy is noted. Mild chronic ischemic white matter disease is noted. No mass effect or midline shift is noted. Ventricular size is within normal limits. There is no evidence of mass lesion, hemorrhage or acute infarction. Vascular: No hyperdense vessel or unexpected calcification. Skull: Normal. Negative for fracture or focal lesion. Sinuses/Orbits: Left sphenoid sinusitis is noted. Other: None. IMPRESSION: Mild diffuse cortical atrophy. Mild chronic ischemic white matter disease. Left sphenoid sinusitis. No acute intracranial abnormality seen. Electronically Signed   By: Marijo Conception, M.D.   On: 08/30/2018 13:08   Mr Brain Wo Contrast  Result Date:  08/30/2018 CLINICAL DATA:  Focal neuro deficit for greater than 6 hours, stroke suspected. Altered level of consciousness. Abnormal speech. Last seen normal at 6 p.m. yesterday. Possible left-sided facial droop. EXAM: MRI HEAD WITHOUT CONTRAST TECHNIQUE: Multiplanar, multiecho pulse sequences of the brain and surrounding structures were obtained without intravenous contrast. COMPARISON:  CT head without contrast 08/30/2018 FINDINGS: Brain: An acute nonhemorrhagic infarct is present posteriorly in the right insular cortex. There is also an acute cortical infarct in the right parietal lobe. No additional infarcts are present. T2 signal changes are present in the right insular cortex. There is no definite T2 change in the right parietal infarct. Moderate atrophy is present. Moderate diffuse confluent periventricular and subcortical T2 hyperintensities are present bilaterally. The ventricles are proportionate to the degree of atrophy. The brainstem is within normal limits.  Cerebellum is unremarkable. Vascular: Flow is present in the major intracranial arteries. Skull and upper cervical spine: The skull base is within normal limits. Craniocervical junction is intact. Central canal narrowing is present at C3-4 due to endplate changes. Marrow signal is normal. Sinuses/Orbits: The paranasal sinuses and mastoid air cells are clear. Bilateral lens replacements are present. Globes and orbits are otherwise within normal limits. IMPRESSION: 1. Two small acute/subacute cortical infarcts involving the right insular cortex and right parietal lobe. 2. Moderate atrophy and diffuse white matter disease is also present. This likely reflects the sequela of chronic microvascular ischemia. Electronically Signed   By: San Morelle M.D.   On: 08/30/2018 17:37   Mr Jodene Nam Head Wo Contrast  Result Date: 08/30/2018 CLINICAL DATA:  Follow-up subacute infarcts. EXAM: MRA HEAD WITHOUT CONTRAST TECHNIQUE: Angiographic images of the  Circle of Willis were obtained using MRA technique without intravenous contrast. COMPARISON:  MRI head August 30, 2018 FINDINGS: ANTERIOR CIRCULATION: Normal flow related enhancement of the included cervical, petrous, cavernous and supraclinoid internal carotid arteries. Patent anterior communicating artery. 1-2 mm superiorly directed aneurysm LEFT A1-2 junction. Patent anterior and middle cerebral arteries. No large vessel occlusion, flow limiting stenosis. POSTERIOR CIRCULATION: RIGHT vertebral artery is dominant. Vertebrobasilar arteries are patent, with normal flow related enhancement of the main branch vessels. Mildly fenestrated basilar tip. Patent posterior cerebral arteries. Small RIGHT posterior communicating artery present. No large vessel occlusion, flow limiting stenosis,  aneurysm. ANATOMIC VARIANTS: None. Source images and MIP images were reviewed. IMPRESSION: 1. No emergent large vessel occlusion or flow-limiting stenosis. 2. 1-2 mm LEFT A-comm aneurysm. Electronically Signed   By: Elon Alas M.D.   On: 08/30/2018 20:36     ASSESSMENT AND PLAN  84 y.o. female with transient episode of slurred speech and gait instability, MRI brain shows embolic infarcts in the right MCA territory.  Patient also complained of palpitations prior to episode.  Right hemispheric embolic infarcts  Risk factors: CHF, CAD, hypthyorodism, HLD Etiology: suspect cardioembolic vs atheroembolic  Recommend # MRI of the brain without contrast #MRA Head # Carotid Doppler  #Transthoracic Echo, may need prolonged cardiac montiring  # Start patient on ASA 81mg  daily #Start or continue Atorvastatin 40 mg/other high intensity statin # BP goal: permissive HTN upto 220/120 mmHg ( 185/110 if patient has CHF, CKD) # HBAIC and Lipid profile # Telemetry monitoring # Frequent neuro checks # NPO until passes stroke swallow screen  Please page stroke NP  Or  PA  Or MD from 8am -4 pm  as this patient from this  time will be  followed by the stroke.   You can look them up on [email or web address]  Password [facility]      Bert Givans [facility] Pager Number [phone]

## 2018-08-31 ENCOUNTER — Ambulatory Visit: Payer: Medicare Other | Admitting: Family Medicine

## 2018-08-31 ENCOUNTER — Other Ambulatory Visit: Payer: Self-pay

## 2018-08-31 ENCOUNTER — Inpatient Hospital Stay (HOSPITAL_COMMUNITY): Payer: Medicare Other

## 2018-08-31 DIAGNOSIS — R002 Palpitations: Secondary | ICD-10-CM

## 2018-08-31 DIAGNOSIS — I6300 Cerebral infarction due to thrombosis of unspecified precerebral artery: Secondary | ICD-10-CM

## 2018-08-31 DIAGNOSIS — J449 Chronic obstructive pulmonary disease, unspecified: Secondary | ICD-10-CM

## 2018-08-31 DIAGNOSIS — E785 Hyperlipidemia, unspecified: Secondary | ICD-10-CM

## 2018-08-31 DIAGNOSIS — I361 Nonrheumatic tricuspid (valve) insufficiency: Secondary | ICD-10-CM

## 2018-08-31 DIAGNOSIS — E039 Hypothyroidism, unspecified: Secondary | ICD-10-CM

## 2018-08-31 LAB — ECHOCARDIOGRAM COMPLETE
Height: 62 in
Weight: 1728.4064 oz

## 2018-08-31 LAB — LIPID PANEL
Cholesterol: 119 mg/dL (ref 0–200)
HDL: 60 mg/dL (ref >40)
LDL Cholesterol: 52 mg/dL (ref 0–99)
Total CHOL/HDL Ratio: 2 RATIO
Triglycerides: 34 mg/dL (ref <150)
VLDL: 7 mg/dL (ref 0–40)

## 2018-08-31 MED ORDER — CLOPIDOGREL BISULFATE 75 MG PO TABS
75.0000 mg | ORAL_TABLET | Freq: Every day | ORAL | Status: DC
  Administered 2018-08-31 – 2018-09-01 (×2): 75 mg via ORAL
  Filled 2018-08-31 (×2): qty 1

## 2018-08-31 MED ORDER — ATORVASTATIN CALCIUM 40 MG PO TABS
40.0000 mg | ORAL_TABLET | Freq: Every day | ORAL | Status: DC
  Administered 2018-08-31: 40 mg via ORAL
  Filled 2018-08-31 (×2): qty 1

## 2018-08-31 NOTE — Evaluation (Addendum)
Occupational Therapy Evaluation Patient Details Name: Holly [name] MRN: [id number] DOB: [birth year] Today's Date: 08/31/2018    History of Present Illness Patient is a 84 y/o female who presents with left sided facial droop, difficulty speaking and trouble with balance. Head CT-unremarkable. Brain MRI- 2 small acute/subacute cortical infarcts right insular cortex and right parietal lobe. NIh:0. PMH includes COPD, CHF, macular degeneration, CAD, HLD.   Clinical Impression   PTA patient independent with ADLs and mobility, requires assist with driving due to hx of macular degeneration.  She was currently admitted for above and limited by decreased STM, impaired safety awareness, and impaired balance.  Patient demonstrates ability to complete ADLs with supervision for safety awareness and balance. Noted several loss of balances with mobility with supervision to correct (pt reporting "my shoes keep getting caught"), limited dual cognitive tasks required to stop and answer questions. Completed SBT scoring 10/28, impairments in short term memory.  Patient will benefit from continued OT service while admitted to address fall prevention, med mgmt, and safety; but anticipate no further OT needs after dc.  Will continue to follow.     Follow Up Recommendations  No OT follow up;Supervision - Intermittent    Equipment Recommendations  None recommended by OT    Recommendations for Other Services       Precautions / Restrictions Precautions Precautions: Fall Restrictions Weight Bearing Restrictions: No      Mobility Bed Mobility Overal bed mobility: Modified Independent                Transfers Overall transfer level: Modified independent Equipment used: None             General transfer comment: Stood from EOB without difficulty.     Balance Overall balance assessment: Needs assistance Sitting-balance support: Feet supported;No upper extremity supported Sitting balance-Leahy  Scale: Good     Standing balance support: During functional activity;No upper extremity supported Standing balance-Leahy Scale: Good Standing balance comment: close supervision for safety                           ADL either performed or assessed with clinical judgement   ADL Overall ADL's : Needs assistance/impaired     Grooming: Supervision/safety;Standing   Upper Body Bathing: Set up;Sitting   Lower Body Bathing: Set up;Supervison/ safety;Sit to/from stand   Upper Body Dressing : Set up;Sitting   Lower Body Dressing: Supervision/safety;Sit to/from stand   Toilet Transfer: Supervision/safety;Ambulation(simulated in room)   Toileting- Clothing Manipulation and Hygiene: Supervision/safety;Sit to/from stand       Functional mobility during ADLs: Supervision/safety;Cueing for safety General ADL Comments: Patient limited by safety awareness and impaired balance.     Vision Baseline Vision/History: Macular Degeneration Patient Visual Report: No change from baseline Vision Assessment?: Yes Eye Alignment: Within Functional Limits Ocular Range of Motion: Within Functional Limits Alignment/Gaze Preference: Within Defined Limits Additional Comments: pt reports using magnifying glass to read directions normally      Perception     Praxis      Pertinent Vitals/Pain Pain Assessment: No/denies pain     Hand Dominance Right   Extremity/Trunk Assessment Upper Extremity Assessment Upper Extremity Assessment: RUE deficits/detail;LUE deficits/detail RUE Deficits / Details: grossly 4/5 MMT  RUE Sensation: WNL RUE Coordination: WNL LUE Deficits / Details: grossly 4/5 MMT LUE Sensation: WNL LUE Coordination: WNL   Lower Extremity Assessment Lower Extremity Assessment: Defer to PT evaluation   Cervical / Trunk Assessment Cervical / Trunk  Assessment: Kyphotic   Communication Communication Communication: HOH   Cognition Arousal/Alertness:  Awake/alert Behavior During Therapy: WFL for tasks assessed/performed Overall Cognitive Status: History of cognitive impairments - at baseline Area of Impairment: Memory;Attention;Awareness                   Current Attention Level: Alternating Memory: Decreased short-term memory     Awareness: Emergent   General Comments: decreased safety awareness, decreased short term memory and dual cognitive tasks; granddaugther reports baseline. SBT completed with deficits in STM.   General Comments  noted several LOB during mobility pt reporting "its these shoes"; family present during sessin    Exercises     Shoulder Instructions      Home Living Family/patient expects to be discharged to:: Private residence Living Arrangements: Children(son works ) Available Help at Discharge: Family;Available PRN/intermittently(son works during the day, family check on intermittently) Type of Home: House Home Access: Level entry     Home Layout: Two level;Able to live on main level with bedroom/bathroom     Bathroom Shower/Tub: Producer, television/film/video: Standard     Home Equipment: Environmental consultant - 2 wheels      Lives With: Son    Prior Functioning/Environment Level of Independence: Independent        Comments: Does not drive due to visual deficits. Cooks, cleans.        OT Problem List: Decreased activity tolerance;Impaired balance (sitting and/or standing);Impaired vision/perception;Decreased safety awareness;Decreased cognition;Decreased knowledge of precautions      OT Treatment/Interventions: Self-care/ADL training;Cognitive remediation/compensation;Patient/family education;Balance training;Therapeutic activities    OT Goals(Current goals can be found in the care plan section) Acute Rehab OT Goals Patient Stated Goal: to go home OT Goal Formulation: With patient Time For Goal Achievement: 09/14/18 Potential to Achieve Goals: Good  OT Frequency: Min 2X/week    Barriers to D/C:            Co-evaluation              AM-PAC PT "6 Clicks" Daily Activity     Outcome Measure Help from another person eating meals?: None Help from another person taking care of personal grooming?: None Help from another person toileting, which includes using toliet, bedpan, or urinal?: None Help from another person bathing (including washing, rinsing, drying)?: None Help from another person to put on and taking off regular upper body clothing?: None Help from another person to put on and taking off regular lower body clothing?: None 6 Click Score: 24   End of Session Nurse Communication: Mobility status  Activity Tolerance: Patient tolerated treatment well Patient left: in bed;with call bell/phone within reach;with family/visitor present  OT Visit Diagnosis: Unsteadiness on feet (R26.81);Other abnormalities of gait and mobility (R26.89);Low vision, both eyes (H54.2)                Time: 0272-5366 OT Time Calculation (min): 21 min Charges:  OT General Charges $OT Visit: 1 Visit OT Evaluation $OT Eval Moderate Complexity: 1 Mod  Chancy Milroy, OT Acute Rehabilitation Services Pager [phone] Office [phone]   Chancy Milroy 08/31/2018, 1:15 PM

## 2018-08-31 NOTE — Progress Notes (Signed)
    [facility] has been requested to perform a transesophageal echocardiogram on 09/01/18 for evaluation of stroke.  After careful review of history and examination, the risks and benefits of transesophageal echocardiogram have been explained including risks of esophageal damage, perforation (1:10,000 risk), bleeding, pharyngeal hematoma as well as other potential complications associated with conscious sedation including aspiration, arrhythmia, respiratory failure and death. Alternatives to treatment were discussed, questions were answered. Patient is willing to proceed.   TEE scheduled for 09/01/18 at 4pm with Dr. Lelan Pons, PA-C 08/31/2018 4:18 PM

## 2018-08-31 NOTE — Progress Notes (Signed)
  Echocardiogram 2D Echocardiogram has been performed.  Merrie Roof F 08/31/2018, 9:55 AM

## 2018-08-31 NOTE — Care Management Note (Signed)
Case Management Note  Patient Details  Name: Holly [name] MRN: [id number] Date of Birth: 01/28/1928  Subjective/Objective:       Pt admitted with stroke. She is from home with her son who works until 6 pm M-F. She has: walker, shower chair but doesn't currently use them. Pt denies any medications issues. Family provides transportation for the patient.              Action/Plan: Recommendations are for Outpatient therapy. Pt would prefer [place] services so that her family doesn't have to arrange transportation. They have used [facility] in the past and would like to use them again. MD will need to place [facility] orders and a face to face.  Family to provide transportation home.   Expected Discharge Date:  09/01/18               Expected Discharge Plan:  [place]  In-House Referral:     Discharge planning Services  CM Consult  Post Acute Care Choice:  Home Health Choice offered to:     DME Arranged:    DME Agency:     HH Arranged:  PT [place]:  [place]  Status of Service:  In process, will continue to follow  If discussed at Long Length of Stay Meetings, dates discussed:    Additional Comments:  Holly Friar, RN 08/31/2018, 2:23 PM

## 2018-08-31 NOTE — Evaluation (Signed)
Speech Language Pathology Evaluation Patient Details Name: Holly [name] MRN: [id number] DOB: [birth year] Today's Date: 08/31/2018 Time: 8588-5027 SLP Time Calculation (min) (ACUTE ONLY): 14 min  Problem List:  Patient Active Problem List   Diagnosis Date Noted  . CVA (cerebral vascular accident) ([place]) 08/30/2018  . Coronary atherosclerosis 02/13/2010  . CELLULITIS 06/18/2008  . GERD 12/25/2007  . CHRONIC OBSTRUCTIVE PULMONARY DISEASE, ACUTE EXACERBATION 11/16/2007  . Hypothyroidism 05/18/2007  . Dyslipidemia 05/18/2007  . COPD mixed type ([place]) 05/18/2007   Past Medical History:  Past Medical History:  Diagnosis Date  . CAD (coronary artery disease)   . CELLULITIS 06/18/2008  . CHF (congestive heart failure) ([place])   . CHRONIC OBSTRUCTIVE PULMONARY DISEASE, ACUTE EXACERBATION 11/16/2007  . COPD 05/18/2007  . GERD 12/25/2007  . HYPERLIPIDEMIA 05/18/2007  . HYPOTHYROIDISM 05/18/2007  . Macular degeneration   . Urosepsis    Past Surgical History:  Past Surgical History:  Procedure Laterality Date  . CATARACT EXTRACTION    . DILATION AND CURETTAGE OF UTERUS    . VARICOSE VEIN SURGERY     HPI:  84 y/o female who presents with left sided facial droop, difficulty speaking and trouble with balance. Head CT-unremarkable. Brain MRI- 2 small acute/subacute cortical infarcts right insular cortex and right parietal lobe. NIh:0. PMH includes COPD, CHF, macular degeneration, CAD, HLD.   Assessment / Plan / Recommendation Clinical Impression  Pt presents with cognitive-linguistic function that appears to be consistent with baseline per family, who was present for assessment.  Pt is oriented, has mild deficits in higher-level attention, baseline deficits in retrieval of short-term information and impaired awareness of specific circumstances.  Speech/language are WNL.  No SLP needs identified - granddaughter/son provide support with managing finances.      SLP Assessment  SLP  Recommendation/Assessment: Patient does not need any further Speech Lanaguage Pathology Services SLP Visit Diagnosis: Cognitive communication deficit (R41.841)    Follow Up Recommendations       Frequency and Duration           SLP Evaluation Cognition  Overall Cognitive Status: Within Functional Limits for tasks assessed Arousal/Alertness: Awake/alert Orientation Level: Oriented X4 Attention: Selective Selective Attention: Appears intact Memory: Impaired Memory Impairment: Retrieval deficit Awareness: (fluctuates) Executive Function: Self Correcting Self Correcting: Appears intact       Comprehension  Auditory Comprehension Overall Auditory Comprehension: Appears within functional limits for tasks assessed Reading Comprehension Reading Status: Not tested(macular degeneration)    Expression Expression Primary Mode of Expression: Verbal Verbal Expression Overall Verbal Expression: Appears within functional limits for tasks assessed Written Expression Dominant Hand: Right   Oral / Motor  Motor Speech Overall Motor Speech: Appears within functional limits for tasks assessed   GO                    Holly [name] 08/31/2018, 12:07 PM   Holly [name], [place] Office number [phone] Pager [phone]

## 2018-08-31 NOTE — Progress Notes (Signed)
VASCULAR LAB PRELIMINARY  PRELIMINARY  PRELIMINARY  PRELIMINARY  Carotid duplex completed.    Preliminary report:  1-39% ICA plaquing. Vertebral artery flow is antegrade.   Jenalyn Girdner, RVT 08/31/2018, 9:01 AM

## 2018-08-31 NOTE — Progress Notes (Signed)
Patient arrived in unit in a bed escorted by ED nurse at 2030pm, pt seemed feeling fine and no s/s distress, denied of any discomfort/pain, pt is alert and oriented , resting in a bed, bed is on lowesr position, call bell and personal belongings are within reach, set up tele monitor box 3w-05, family members are on the bedside, and will continue to monitor closely.

## 2018-08-31 NOTE — Progress Notes (Addendum)
[facility] - DAILY PROGRESS NOTE    HISTORY Holly [name] is an 84 y.o. female  with PMH of CHF, CAD, hypthyorodism, HLD presents with transient episode of slurred speech and gait instability that occurred around 6 PM yesterday evening witnessed by her son.  Patient's son insisted she come to the hospital and evaluated today in the ER.  MRI brain shows small acute infarcts in the right parietal lobe and insula.  She was admitted for stroke work-up and neurology was consulted.  Patient not on ASA due to advice from her ophthalmologist for macular degeneration?  She also states that she had some palpitations/quivering in her neck prior to this episode.  Date last known well: 10.1.19 Time last known well: 6pm tPA Given: no, outside window NIHSS: 0 Baseline MRS 0   SUBJECTIVE Patient seen in bed with family at the bedside.  She is awake alert oriented x3, feels back to her baseline.  Denies blurred vision, ocular pain, nausea, vomiting.  She is accompanied by her granddaughter who complements the history. She denies any prior history of atrial fibrillation, syncope her cardiac history. OBJECTIVE Most recent Vital Signs: Vitals:   08/31/18 0040 08/31/18 0327 08/31/18 0831 08/31/18 1249  BP: (!) 118/58 (!) 95/47 (!) 131/49 (!) 127/54  Pulse: 69 67 64 62  Resp: 20 18 18 18   Temp: 98 F (36.7 C) 98.8 F (37.1 C) 98.1 F (36.7 C) 98 F (36.7 C)  TempSrc: Oral Oral Oral Oral  SpO2: 96% 97% 100% 98%  Weight:      Height:       CBG (last 3)  No results for input(s): GLUCAP in the last 72 hours.  Physical Exam  HEENT-  Normocephalic, no lesions, without obvious abnormality.  Normal external eye and conjunctiva.   Cardiovascular- S1-S2 audible, pulses palpable throughout   Lungs-no rhonchi or wheezing noted, no excessive working breathing.   Abdomen-normal bowel sounds Musculoskeletal-no joint tenderness, deformity  or swelling Skin-warm and dry,  Neuro:  Mental Status: Alert, oriented, thought content appropriate.  Speech fluent without evidence of aphasia.  Able to follow 3 step commands without difficulty. Cranial Nerves: II:  Visual fields grossly normal,  III,IV, VI: ptosis not present, extra-ocular motions intact bilaterally pupils equal, round, reactive to light and accommodation V,VII: smile symmetric, facial light touch sensation normal bilaterally VIII: hearing intact to voice IX,X: uvula rises symmetrically XI: bilateral shoulder shrug XII: midline tongue extension Motor: Moves all extremities equally without weakness or drift Right : Upper extremity   5/5    Left:     Upper extremity   5/5  Lower extremity   5/5     Lower extremity   5/5 Tone and bulk:normal tone throughout; no atrophy noted Sensory: Sensation to light touch intact throughout, bilaterally Cerebellar: normal finger-to-nose and normal rapid alternating movements  Gait: Not tested  IV Fluid Intake:    MEDICATIONS  . aspirin EC  81 mg Oral Daily  . enoxaparin (LOVENOX) injection  30 mg Subcutaneous Q24H   PRN:  acetaminophen **OR** acetaminophen (TYLENOL) oral liquid 160 mg/5 mL **OR** acetaminophen, ondansetron **OR** ondansetron (ZOFRAN) IV  Diet:   Diet Order  Diet Heart Room service appropriate? Yes; Fluid consistency: Thin  Diet effective now               CLINICALLY SIGNIFICANT STUDIES Basic Metabolic Panel:  Recent Labs  Lab 08/30/18 1222 08/30/18 1251  NA 140 140  K 3.7 4.3  CL 107 105  CO2 25  --   GLUCOSE 88 81  BUN 22 34*  CREATININE 1.12* 1.20*  CALCIUM 9.4  --    Liver Function Tests:  Recent Labs  Lab 08/30/18 1222  AST 25  ALT 12  ALKPHOS 64  BILITOT 1.1  PROT 6.6  ALBUMIN 3.7   CBC:  Recent Labs  Lab 08/30/18 1222 08/30/18 1251  WBC 5.4  --   NEUTROABS 3.3  --   HGB 12.6 13.6  HCT 41.0 40.0  MCV 100.5*  --   PLT 231  --    Coagulation:  Recent Labs    Lab 08/30/18 1222  LABPROT 12.1  INR 0.90    Lipid Panel    Component Value Date/Time   CHOL 119 08/31/2018 0500   TRIG 34 08/31/2018 0500   HDL 60 08/31/2018 0500   CHOLHDL 2.0 08/31/2018 0500   VLDL 7 08/31/2018 0500   LDLCALC 52 08/31/2018 0500   HgbA1C No results found for: HGBA1C  Urine Drug Screen:  No results found for: LABOPIA, COCAINSCRNUR, LABBENZ, AMPHETMU, THCU, LABBARB  Alcohol Level: No results for input(s): ETH in the last 168 hours.  Ct Head Wo Contrast  08/30/2018 IMPRESSION: Mild diffuse cortical atrophy. Mild chronic ischemic white matter disease. Left sphenoid sinusitis. No acute intracranial abnormality seen.  Mr Brain Wo Contrast 08/30/2018 IMPRESSION: 1. Two small acute/subacute cortical infarcts involving the right insular cortex and right parietal lobe. 2. Moderate atrophy and diffuse white matter disease is also present. This likely reflects the sequela of chronic microvascular ischemia.  Mr Jodene Nam Head Wo Contrast  08/30/2018 IMPRESSION: 1. No emergent large vessel occlusion or flow-limiting stenosis. 2. 1-2 mm LEFT A-comm aneurysm.   Carotid Doppler 08/31/18 Final Interpretation: Right Carotid: The extracranial vessels were near-normal with only minimal wall        thickening or plaque.  Left Carotid: The extracranial vessels were near-normal with only minimal wall       thickening or plaque.  Vertebrals: Bilateral vertebral arteries demonstrate antegrade flow. Subclavians: Normal flow hemodynamics were seen in bilateral subclavian       arteries.  Echo 08/31/18 Study Conclusions - Left ventricle: The cavity size was normal. Wall thickness was   normal. Systolic function was vigorous. The estimated ejection   fraction was in the range of 65% to 70%. - Aortic valve: AV is thickened, calcified with mildly restricted   motion. Peak and mean gradients through the valve are 27 and 14   mm Hg respectively. consistent with mild  AS. There was trivial   regurgitation. - Right ventricle: Systolic function was mildly reduced.  EKG sinus rhythm with right bundle branch block, for complete results please see formal report.   Outstanding Stroke Work-up Studies:     TEE with loop recorder    pending   ASSESSMENT/PLAN Stroke:   Small acute infarcts in the right parietal lobe and insula likely embolic  with resulting left facial droop,  slurred speech, gait instability. Slurred Speech has since resolved  Code Stroke: No  CTA head & neck : Performed CT of the brain:  Mild diffuse cortical atrophy. Mild chronic ischemic white matter disease. Left sphenoid sinusitis  MRI of the brain : Two small acute/subacute cortical infarcts involving the right insular cortex and right parietal lobe  MRA of the brain : No emergent large vessel occlusion or flow-limiting stenosis, 1-2 mm LEFT A-comm aneurysm.  Carotid Doppler: The extracranial vessels were near-normal with only minimal wall thickening or plaque. Bilateral vertebral arteries demonstrate antegrade flow  2D Echocardiogram ejection fraction 65 to 47% with systolic function was vigorous, no mention of LV thrombus   LDL  52   HgbA1c  pending  VTE Lovenox  Antiplatelets:None At home - Patient not on ASA due to advice from her ophthalmologist for macular degeneration  Started on aspirin 81 and Plavix 75  mg while in hospital for 3 weeks and then continue Plavix alone  Atorvastatin 40 mg   Continue Rehab with PT consult, OT consult, Speech consult  Therapy recommendations:   Outpatient physical therapy  Disposition:   Pending  Patient with  embolic stroke and no evidence of A. fib on tele or history of A. Fib, patient does complain of palpitations, further investigation for possible source of stroke pending with a TEE with loop recorder in the a.m.  Follow-up with [facility] neurology    Hypertension  Elevated : SBP 110 - 130s  Home medications: None  BP goal  normotensive   Hyperlipidemia  Home meds: Atorvastatin 10 mg    LDL 52 goal < 70  Add lipitor 40mg  mg daily  Continue statin at discharge   Other Stroke Risk Factors  Advanced age  CAD  CHF-not in exacerbation  COPD   Other Active Problems  GERD  Hypothyroidism  Macular degeneration  Hospital day # 1  SIGNED [facility] Neuro-hospitalist Team [phone] 08/31/2018, 1:04 PM   08/31/2018 ATTENDING ASSESSMENT   She presented with transient slurred speech and left facial droop due to small embolic right insular and parietal cortical infarcts in etiology to be determined. Continue cardiac telemetry and check transesophageal echocardiogram for cardiac source of embolism and loop recorder for paroxysmal A. Fib. Recommend dual antiplatelet therapy of aspirin and Plavix for 3 weeks followed by aspirin alone. Long discussion with the patient and granddaughter and answered questions about her care. Greater than 50% time during this 35 minute visit was spent on counseling and coordination of care about a stroke and discussion with the care team and answering questions Antony Contras, MD Medical Director [place] Pager: [phone] 08/31/2018 2:51 PM   To contact Stroke Continuity provider, please refer to [email or web address] After hours, contact General Neurology

## 2018-08-31 NOTE — Progress Notes (Signed)
[facility] PROGRESS NOTE  Holly [name] WUJ:[id number] DOB: [birth year] DOA: 08/30/2018  PCP: Gordy Savers, MD  Brief History/Interval Summary: 84 y.o. female with medical history significant of CAD, cellulitis, unspecified CHF COPD, GERD, hyperlipidemia, hypothyroidism, macular degeneration, history of urosepsis who is brought by her daughter after she had a facial drooling the day before admission around 1730.  Symptoms had subsided so they did not call EMS noted they seek medical attention.  They subsequently called PCP on the day of admission who asked him to come into the emergency department.  Patient's MRI was concerning for embolic stroke.  Patient was hospitalized for further management.   Reason for Visit: Acute stroke  Consultants: Neurology  Procedures:  Transthoracic echocardiogram is pending  Antibiotics: None  Subjective/Interval History: Patient denies any complaints currently.  She denies recurrence of her symptoms.  Denies any weakness on any one side of her body.  She did have palpitations the night before the day of admission but none since then.  ROS: Denies any nausea or vomiting  Objective:  Vital Signs  Vitals:   08/31/18 0000 08/31/18 0040 08/31/18 0327 08/31/18 0831  BP:  (!) 118/58 (!) 95/47 (!) 131/49  Pulse:  69 67 64  Resp:  20 18 18   Temp:  98 F (36.7 C) 98.8 F (37.1 C) 98.1 F (36.7 C)  TempSrc: Oral Oral Oral Oral  SpO2:  96% 97% 100%  Weight:      Height:        Intake/Output Summary (Last 24 hours) at 08/31/2018 1220 Last data filed at 08/30/2018 2300 Gross per 24 hour  Intake 60 ml  Output -  Net 60 ml   Filed Weights   08/30/18 2040 08/30/18 2358 08/30/18 2359  Weight: 51.1 kg 49 kg 49 kg    General appearance: alert, cooperative, appears stated age and no distress Head: Normocephalic, without obvious abnormality, atraumatic Resp: clear to auscultation bilaterally Cardio: regular rate and rhythm, S1, S2  normal, no murmur, click, rub or gallop GI: soft, non-tender; bowel sounds normal; no masses,  no organomegaly Extremities: extremities normal, atraumatic, no cyanosis or edema Pulses: 2+ and symmetric Neurologic: No focal deficits at this time.  She is alert and oriented x3.  No facial asymmetry.  No pronator drift.  Motor strength is equal in the upper and lower extremities.  Lab Results:  Data Reviewed: I have personally reviewed following labs and imaging studies  CBC: Recent Labs  Lab 08/30/18 1222 08/30/18 1251  WBC 5.4  --   NEUTROABS 3.3  --   HGB 12.6 13.6  HCT 41.0 40.0  MCV 100.5*  --   PLT 231  --     Basic Metabolic Panel: Recent Labs  Lab 08/30/18 1222 08/30/18 1251  NA 140 140  K 3.7 4.3  CL 107 105  CO2 25  --   GLUCOSE 88 81  BUN 22 34*  CREATININE 1.12* 1.20*  CALCIUM 9.4  --     GFR: Estimated Creatinine Clearance: 25.1 mL/min (A) (by C-G formula based on SCr of 1.2 mg/dL (H)).  Liver Function Tests: Recent Labs  Lab 08/30/18 1222  AST 25  ALT 12  ALKPHOS 64  BILITOT 1.1  PROT 6.6  ALBUMIN 3.7    Coagulation Profile: Recent Labs  Lab 08/30/18 1222  INR 0.90    Lipid Profile: Recent Labs    08/31/18 0500  CHOL 119  HDL 60  LDLCALC 52  TRIG 34  CHOLHDL  2.0      Radiology Studies: Ct Head Wo Contrast  Result Date: 08/30/2018 CLINICAL DATA:  Left leg weakness, slurred speech. EXAM: CT HEAD WITHOUT CONTRAST TECHNIQUE: Contiguous axial images were obtained from the base of the skull through the vertex without intravenous contrast. COMPARISON:  None. FINDINGS: Brain: Mild diffuse cortical atrophy is noted. Mild chronic ischemic white matter disease is noted. No mass effect or midline shift is noted. Ventricular size is within normal limits. There is no evidence of mass lesion, hemorrhage or acute infarction. Vascular: No hyperdense vessel or unexpected calcification. Skull: Normal. Negative for fracture or focal lesion.  Sinuses/Orbits: Left sphenoid sinusitis is noted. Other: None. IMPRESSION: Mild diffuse cortical atrophy. Mild chronic ischemic white matter disease. Left sphenoid sinusitis. No acute intracranial abnormality seen. Electronically Signed   By: Lupita Raider, M.D.   On: 08/30/2018 13:08   Mr Brain Wo Contrast  Result Date: 08/30/2018 CLINICAL DATA:  Focal neuro deficit for greater than 6 hours, stroke suspected. Altered level of consciousness. Abnormal speech. Last seen normal at 6 p.m. yesterday. Possible left-sided facial droop. EXAM: MRI HEAD WITHOUT CONTRAST TECHNIQUE: Multiplanar, multiecho pulse sequences of the brain and surrounding structures were obtained without intravenous contrast. COMPARISON:  CT head without contrast 08/30/2018 FINDINGS: Brain: An acute nonhemorrhagic infarct is present posteriorly in the right insular cortex. There is also an acute cortical infarct in the right parietal lobe. No additional infarcts are present. T2 signal changes are present in the right insular cortex. There is no definite T2 change in the right parietal infarct. Moderate atrophy is present. Moderate diffuse confluent periventricular and subcortical T2 hyperintensities are present bilaterally. The ventricles are proportionate to the degree of atrophy. The brainstem is within normal limits.  Cerebellum is unremarkable. Vascular: Flow is present in the major intracranial arteries. Skull and upper cervical spine: The skull base is within normal limits. Craniocervical junction is intact. Central canal narrowing is present at C3-4 due to endplate changes. Marrow signal is normal. Sinuses/Orbits: The paranasal sinuses and mastoid air cells are clear. Bilateral lens replacements are present. Globes and orbits are otherwise within normal limits. IMPRESSION: 1. Two small acute/subacute cortical infarcts involving the right insular cortex and right parietal lobe. 2. Moderate atrophy and diffuse white matter disease is also  present. This likely reflects the sequela of chronic microvascular ischemia. Electronically Signed   By: Marin Roberts M.D.   On: 08/30/2018 17:37   Mr Maxine Glenn Head Wo Contrast  Result Date: 08/30/2018 CLINICAL DATA:  Follow-up subacute infarcts. EXAM: MRA HEAD WITHOUT CONTRAST TECHNIQUE: Angiographic images of the Circle of Willis were obtained using MRA technique without intravenous contrast. COMPARISON:  MRI head August 30, 2018 FINDINGS: ANTERIOR CIRCULATION: Normal flow related enhancement of the included cervical, petrous, cavernous and supraclinoid internal carotid arteries. Patent anterior communicating artery. 1-2 mm superiorly directed aneurysm LEFT A1-2 junction. Patent anterior and middle cerebral arteries. No large vessel occlusion, flow limiting stenosis. POSTERIOR CIRCULATION: RIGHT vertebral artery is dominant. Vertebrobasilar arteries are patent, with normal flow related enhancement of the main branch vessels. Mildly fenestrated basilar tip. Patent posterior cerebral arteries. Small RIGHT posterior communicating artery present. No large vessel occlusion, flow limiting stenosis,  aneurysm. ANATOMIC VARIANTS: None. Source images and MIP images were reviewed. IMPRESSION: 1. No emergent large vessel occlusion or flow-limiting stenosis. 2. 1-2 mm LEFT A-comm aneurysm. Electronically Signed   By: Awilda Metro M.D.   On: 08/30/2018 20:36     Medications:  Scheduled: . aspirin EC  81 mg Oral Daily  . enoxaparin (LOVENOX) injection  30 mg Subcutaneous Q24H   Continuous:  ZOX:[id number] **OR** acetaminophen (TYLENOL) oral liquid 160 mg/5 mL **OR** acetaminophen, ondansetron **OR** ondansetron (ZOFRAN) IV  Assessment/Plan:    Acute stroke Concern is for acute embolic stroke.  Neurology is following.  Echocardiogram.  LDL is 52.  HbA1c is pending.  Patient is noted to be on aspirin.  PT and OT evaluation.  EKG showed sinus rhythm.  RBBB was noted which has been seen previously  as well.  TEE is being considered.  Palpitations Patient is on telemetry.  Plan is for loop recorder.  Echocardiogram is pending.  Check thyroid function test.  Hypothyroidism Continue with levothyroxine.  Check TSH and free T4 levels.  Dyslipidemia LDL is very well controlled at 52.  Continue statin.  History of coronary atherosclerosis Continue statin.  History of COPD Continue home medications.  History of GERD Continue PPI.  Mildly elevated creatinine, likely chronic kidney disease stage III Monitor renal function.   DVT Prophylaxis: Lovenox    Code Status: Full code Family Communication: Discussed with the patient and her granddaughter Disposition Plan: Management as outlined above.    LOS: 1 day   Osvaldo Shipper  [facility] Pager [phone] 08/31/2018, 12:20 PM  If 7PM-7AM, please contact night-coverage at [email or web address], password [facility]

## 2018-08-31 NOTE — Evaluation (Signed)
Physical Therapy Evaluation Patient Details Name: Holly [name] MRN: [id number] DOB: [birth year] Today's Date: 08/31/2018   History of Present Illness  Patient is a 84 y/o female who presents with left sided facial droop, difficulty speaking and trouble with balance. Head CT-unremarkable. Brain MRI- 2 small acute/subacute cortical infarcts right insular cortex and right parietal lobe. NIh:0. PMH includes COPD, CHF, macular degeneration, CAD, HLD.  Clinical Impression  Patient presents with impaired balance and questionable safety awareness s/p above. Tolerated transfer training, gait training and stair training with supervision-Min guard for safety. Pt with difficulty dual tasking-needing to stop and answer questions during ambulation. Veering towards left/right but no overt LOB. Reports this is baseline for her, granddaughter agrees. Pt independent PTA and lives with son. Education re: Be[employer]. Would benefit from higher level balance activities in OPPT if pt willing to decrease overall fall risk. Does not require skilled therapy services in acute setting as pt functioning at baseline and reports no symptoms. All education completed. Discharge from therapy.    Follow Up Recommendations Outpatient PT;Supervision - Intermittent    Equipment Recommendations  None recommended by PT    Recommendations for Other Services       Precautions / Restrictions Precautions Precautions: Fall Restrictions Weight Bearing Restrictions: No      Mobility  Bed Mobility               General bed mobility comments: Sitting EOB upon PT arrival.   Transfers Overall transfer level: Modified independent Equipment used: None             General transfer comment: Stood from EOB without difficulty.   Ambulation/Gait Ambulation/Gait assistance: Min guard Gait Distance (Feet): 300 Feet Assistive device: None Gait Pattern/deviations: Step-through pattern;Staggering left;Drifts right/left;Narrow base  of support     General Gait Details: Slow, unsteady gait with pt veering towards left/right, "that is me doing that," reports as baseline; no overt LOB. 2/4 DOE. HR ranged from 70-99 bpm.  Stairs Stairs: Yes Stairs assistance: Min guard Stair Management: Step to pattern;Alternating pattern;One rail Right Number of Stairs: 5 General stair comments: Cues for safety.  Wheelchair Mobility    Modified Rankin (Stroke Patients Only) Modified Rankin (Stroke Patients Only) Pre-Morbid Rankin Score: Slight disability Modified Rankin: Moderate disability     Balance Overall balance assessment: Needs assistance Sitting-balance support: Feet supported;No upper extremity supported Sitting balance-Leahy Scale: Good     Standing balance support: During functional activity Standing balance-Leahy Scale: Good Standing balance comment: Able to stand at sink and wash hands without difficulty reaching outside BoS. Grand             High level balance activites: Sudden stops;Head turns High Level Balance Comments: Gait deviations noted with above but no overt LOB.             Pertinent Vitals/Pain Pain Assessment: No/denies pain    Home Living Family/patient expects to be discharged to:: Private residence Living Arrangements: Children(with son who works) Available Help at Discharge: Family;Available PRN/intermittently Type of Home: House Home Access: Level entry     Home Layout: Two level;Able to live on main level with bedroom/bathroom Home Equipment: Walker - 2 wheels      Prior Function Level of Independence: Independent         Comments: Does not drive due to visual deficits. Cooks, cleans.     Hand Dominance        Extremity/Trunk Assessment   Upper Extremity Assessment Upper Extremity Assessment: Defer to OT  evaluation    Lower Extremity Assessment Lower Extremity Assessment: RLE deficits/detail;LLE deficits/detail(Grossly ~4-5/5 throughout) RLE  Sensation: WNL LLE Sensation: WNL    Cervical / Trunk Assessment Cervical / Trunk Assessment: Kyphotic  Communication   Communication: HOH  Cognition Arousal/Alertness: Awake/alert Behavior During Therapy: WFL for tasks assessed/performed Overall Cognitive Status: Within Functional Limits for tasks assessed                                 General Comments: for basic tasks and per granddaughter who is present in the room. Question safety awareness at times. Difficulty with dual tasking- needed to stop and answer questions at times.       General Comments General comments (skin integrity, edema, etc.): Granddaughter present during session.    Exercises     Assessment/Plan    PT Assessment Patent does not need any further PT services  PT Problem List Decreased safety awareness;Decreased balance;Cardiopulmonary status limiting activity;Decreased cognition       PT Treatment Interventions      PT Goals (Current goals can be found in the Care Plan section)  Acute Rehab PT Goals Patient Stated Goal: to go home PT Goal Formulation: All assessment and education complete, DC therapy    Frequency     Barriers to discharge        Co-evaluation               AM-PAC PT "6 Clicks" Daily Activity  Outcome Measure Difficulty turning over in bed (including adjusting bedclothes, sheets and blankets)?: None Difficulty moving from lying on back to sitting on the side of the bed? : None Difficulty sitting down on and standing up from a chair with arms (e.g., wheelchair, bedside commode, etc,.)?: None Help needed moving to and from a bed to chair (including a wheelchair)?: None Help needed walking in hospital room?: A Little Help needed climbing 3-5 steps with a railing? : A Little 6 Click Score: 22    End of Session Equipment Utilized During Treatment: Gait belt Activity Tolerance: Patient tolerated treatment well Patient left: in bed;with call bell/phone within  reach;with family/visitor present Nurse Communication: Mobility status PT Visit Diagnosis: Unsteadiness on feet (R26.81);Difficulty in walking, not elsewhere classified (R26.2)    Time: 0981-1914 PT Time Calculation (min) (ACUTE ONLY): 14 min   Charges:   PT Evaluation $PT Eval Moderate Complexity: 1 Mod          Mylo Red, PT, DPT Acute Rehabilitation Services Pager [phone] Office [phone]      Blake Divine A Lanier Ensign 08/31/2018, 8:11 AM

## 2018-09-01 ENCOUNTER — Ambulatory Visit: Payer: Medicare Other | Admitting: Family Medicine

## 2018-09-01 ENCOUNTER — Encounter (HOSPITAL_COMMUNITY): Payer: Self-pay | Admitting: *Deleted

## 2018-09-01 ENCOUNTER — Encounter (HOSPITAL_COMMUNITY)
Admission: EM | Disposition: A | Discharge status: Home Health | Payer: Self-pay | Source: Home / Self Care | Attending: Internal Medicine

## 2018-09-01 ENCOUNTER — Inpatient Hospital Stay (HOSPITAL_COMMUNITY): Payer: Medicare Other

## 2018-09-01 DIAGNOSIS — I639 Cerebral infarction, unspecified: Secondary | ICD-10-CM

## 2018-09-01 DIAGNOSIS — I6389 Other cerebral infarction: Secondary | ICD-10-CM

## 2018-09-01 HISTORY — PX: TEE WITHOUT CARDIOVERSION: SHX5443

## 2018-09-01 LAB — COMPREHENSIVE METABOLIC PANEL
ALT: 11 U/L (ref 0–44)
AST: 25 U/L (ref 15–41)
Albumin: 3.3 g/dL — ABNORMAL LOW (ref 3.5–5.0)
Alkaline Phosphatase: 58 U/L (ref 38–126)
Anion gap: 8 (ref 5–15)
BUN: 21 mg/dL (ref 8–23)
CO2: 29 mmol/L (ref 22–32)
Calcium: 9 mg/dL (ref 8.9–10.3)
Chloride: 104 mmol/L (ref 98–111)
Creatinine, Ser: 1.05 mg/dL — ABNORMAL HIGH (ref 0.44–1.00)
GFR calc Af Amer: 53 mL/min — ABNORMAL LOW (ref >60)
GFR calc non Af Amer: 46 mL/min — ABNORMAL LOW (ref >60)
Glucose, Bld: 93 mg/dL (ref 70–99)
Potassium: 4.8 mmol/L (ref 3.5–5.1)
Sodium: 141 mmol/L (ref 135–145)
Total Bilirubin: 1.2 mg/dL (ref 0.3–1.2)
Total Protein: 6 g/dL — ABNORMAL LOW (ref 6.5–8.1)

## 2018-09-01 LAB — HEMOGLOBIN A1C
Hgb A1c MFr Bld: 5.4 % (ref 4.8–5.6)
Mean Plasma Glucose: 108 mg/dL

## 2018-09-01 LAB — CBC
HCT: 38.6 % (ref 36.0–46.0)
Hemoglobin: 12.1 g/dL (ref 12.0–15.0)
MCH: 31 pg (ref 26.0–34.0)
MCHC: 31.3 g/dL (ref 30.0–36.0)
MCV: 99 fL (ref 78.0–100.0)
Platelets: 229 10*3/uL (ref 150–400)
RBC: 3.9 MIL/uL (ref 3.87–5.11)
RDW: 15.1 % (ref 11.5–15.5)
WBC: 5.7 10*3/uL (ref 4.0–10.5)

## 2018-09-01 LAB — TSH: TSH: 11.91 u[IU]/mL — ABNORMAL HIGH (ref 0.350–4.500)

## 2018-09-01 LAB — T4, FREE: Free T4: 0.89 ng/dL (ref 0.82–1.77)

## 2018-09-01 SURGERY — ECHOCARDIOGRAM, TRANSESOPHAGEAL
Anesthesia: Moderate Sedation

## 2018-09-01 MED ORDER — SODIUM CHLORIDE 0.9 % IV SOLN: INTRAVENOUS | Status: DC

## 2018-09-01 MED ORDER — CLOPIDOGREL BISULFATE 75 MG PO TABS: 75.0000 mg | ORAL_TABLET | Freq: Every day | ORAL | 0 refills | Status: AC

## 2018-09-01 MED ORDER — MIDAZOLAM HCL 2 MG/2ML IJ SOLN
INTRAMUSCULAR | Status: DC | PRN
  Administered 2018-09-01 (×2): 1 mg via INTRAVENOUS

## 2018-09-01 MED ORDER — BUTAMBEN-TETRACAINE-BENZOCAINE 2-2-14 % EX AERO
INHALATION_SPRAY | CUTANEOUS | Status: DC | PRN
  Administered 2018-09-01: 2 via TOPICAL

## 2018-09-01 MED ORDER — FENTANYL CITRATE (PF) 100 MCG/2ML IJ SOLN
INTRAMUSCULAR | Status: DC | PRN
  Administered 2018-09-01: 25 ug via INTRAVENOUS

## 2018-09-01 MED ORDER — ATORVASTATIN CALCIUM 40 MG PO TABS: 40.0000 mg | ORAL_TABLET | Freq: Every day | ORAL | 0 refills | Status: DC

## 2018-09-01 MED ORDER — MIDAZOLAM HCL 5 MG/ML IJ SOLN
INTRAMUSCULAR | Status: AC
  Filled 2018-09-01: qty 2

## 2018-09-01 MED ORDER — LEVOTHYROXINE SODIUM 88 MCG PO TABS: 88.0000 ug | ORAL_TABLET | Freq: Every day | ORAL | 0 refills | Status: DC

## 2018-09-01 MED ORDER — FENTANYL CITRATE (PF) 100 MCG/2ML IJ SOLN
INTRAMUSCULAR | Status: AC
  Filled 2018-09-01: qty 2

## 2018-09-01 MED ORDER — ASPIRIN 81 MG PO TBEC: 81.0000 mg | DELAYED_RELEASE_TABLET | Freq: Every day | ORAL | 3 refills | Status: DC

## 2018-09-01 NOTE — Consult Note (Addendum)
ELECTROPHYSIOLOGY CONSULT NOTE  Patient ID: Holly [name] MRN: [id number], DOB/AGE: [id number]   Admit date: 08/30/2018 Date of Consult: 09/01/2018  Primary Physician: Gordy Savers, MD Primary Cardiologist: none Reason for Consultation: Cryptogenic stroke - recommendations regarding Implantable Loop Recorder, requested by Dr. Pearlean Brownie  History of Present Illness Holly [name] was admitted on 08/30/2018 with acute stroke.   PMHx includes CAD (listed in chart though patient denies this), COPD, GERD, hypothyroidism, HLD, macular degeneration.  She first developed symptoms while with family.  Imaging demonstrated Small acute infarcts in the right parietal lobe and insula likely embolic .  she has undergone workup for stroke including echocardiogram and carotid dopplers.  The patient has been monitored on telemetry which has demonstrated sinus rhythm with no arrhythmias.  Inpatient stroke work-up is to be completed with a TEE.   Echocardiogram this admission demonstrated   Echo 08/31/18 Study Conclusions - Left ventricle: The cavity size was normal. Wall thickness was normal. Systolic function was vigorous. The estimated ejection fraction was in the range of 65% to 70%. - Aortic valve: AV is thickened, calcified with mildly restricted motion. Peak and mean gradients through the valve are 27 and 14 mm Hg respectively. consistent with mild AS. There was trivial regurgitation. - Right ventricle: Systolic function was mildly reduced.   Carotid Doppler 08/31/18 Final Interpretation: Right Carotid: The extracranial vessels were near-normal with only minimal wall        thickening or plaque Left Carotid: The extracranial vessels were near-normal with only minimal wall       thickening or plaque Vertebrals: Bilateral vertebral arteries demonstrate antegrade flow. Subclavians: Normal flow hemodynamics were seen in bilateral subclavian       arteries.   Lab  work is reviewed.  Prior to admission, the patient denies chest pain, shortness of breath, dizziness,  or syncope.  She does mention though that the night prior to her stroke, she had an episode of palpitations, she states that she had a sudden pressure type sensation in base of her throat that seemed to radiate upwards, got up, took a tylenol and noted tat her heart beat was racing.  This lasted an hour or so, seemed to settle and she went to sleep.  The following morning when her son visited is when he noted her facial droop/slurring/weakness.  She is recovering from their stroke with plans to home at discharge.   Past Medical History:  Diagnosis Date  . CAD (coronary artery disease)   . CELLULITIS 06/18/2008  . CHF (congestive heart failure) (HCC)   . CHRONIC OBSTRUCTIVE PULMONARY DISEASE, ACUTE EXACERBATION 11/16/2007  . COPD 05/18/2007  . GERD 12/25/2007  . HYPERLIPIDEMIA 05/18/2007  . HYPOTHYROIDISM 05/18/2007  . Macular degeneration   . Urosepsis      Surgical History:  Past Surgical History:  Procedure Laterality Date  . CATARACT EXTRACTION    . DILATION AND CURETTAGE OF UTERUS    . VARICOSE VEIN SURGERY       Medications Prior to Admission  Medication Sig Dispense Refill Last Dose  . albuterol (PROVENTIL HFA) 108 (90 Base) MCG/ACT inhaler Inhale 2 puffs into the lungs every 6 (six) hours as needed. (Patient taking differently: Inhale 1-2 puffs into the lungs every 6 (six) hours as needed for wheezing or shortness of breath. ) 20.1 each 5 Past Month at Unknown time  . atorvastatin (LIPITOR) 10 MG tablet TAKE 1 TABLET BY MOUTH EVERY DAY (Patient taking differently: Take 10 mg by mouth  daily at 6 PM. ) 90 tablet 1 08/30/2018 at Unknown time  . Fluticasone-Salmeterol (WIXELA INHUB) 500-50 MCG/DOSE AEPB Inhale 1 puff into the lungs 2 (two) times daily.   08/29/2018 at Unknown time  . levothyroxine (SYNTHROID, LEVOTHROID) 75 MCG tablet TAKE 1 TABLET BY MOUTH EVERY DAY (Patient taking  differently: Take 75 mcg by mouth daily before breakfast. ) 90 tablet 2 08/30/2018 at Unknown time  . Multiple Vitamins-Minerals (ICAPS PO) Take 1 capsule by mouth 2 (two) times daily.   08/30/2018 at aM  . Fluticasone-Salmeterol (ADVAIR DISKUS) 500-50 MCG/DOSE AEPB INHALE 1 PUFF INTO THE LUNGS EVERY 12 HOURS. (Patient not taking: Reported on 08/30/2018) 180 each 6 Not Taking at Unknown time    Inpatient Medications:  . aspirin EC  81 mg Oral Daily  . atorvastatin  40 mg Oral q1800  . clopidogrel  75 mg Oral Daily  . enoxaparin (LOVENOX) injection  30 mg Subcutaneous Q24H    Allergies:  Allergies  Allergen Reactions  . Levofloxacin     REACTION: unspecified  . Macrobid [Nitrofurantoin Macrocrystal] Nausea Only  . Sulfamethoxazole     REACTION: unspecified  . Tramadol Other (See Comments)    Shakes   . Amoxicillin Rash    Has patient had a PCN reaction causing immediate rash, facial/tongue/throat swelling, SOB or lightheadedness with hypotension: No Has patient had a PCN reaction causing severe rash involving mucus membranes or skin necrosis: No Has patient had a PCN reaction that required hospitalization: No Has patient had a PCN reaction occurring within the last 10 years: No If all of the above answers are "NO", then may proceed with Cephalosporin use.   Made "bottom area" very raw    Social History   Socioeconomic History  . Marital status: Married    Spouse name: Not on file  . Number of children: Not on file  . Years of education: Not on file  . Highest education level: Not on file  Occupational History  . Not on file  Social Needs  . Financial resource strain: Not on file  . Food insecurity:    Worry: Not on file    Inability: Not on file  . Transportation needs:    Medical: Not on file    Non-medical: Not on file  Tobacco Use  . Smoking status: Former Smoker    Last attempt to quit: 11/29/1985    Years since quitting: 32.7  . Smokeless tobacco: Never Used    Substance and Sexual Activity  . Alcohol use: No  . Drug use: No  . Sexual activity: Not on file  Lifestyle  . Physical activity:    Days per week: Not on file    Minutes per session: Not on file  . Stress: Not on file  Relationships  . Social connections:    Talks on phone: Not on file    Gets together: Not on file    Attends religious service: Not on file    Active member of club or organization: Not on file    Attends meetings of clubs or organizations: Not on file    Relationship status: Not on file  . Intimate partner violence:    Fear of current or ex partner: Not on file    Emotionally abused: Not on file    Physically abused: Not on file    Forced sexual activity: Not on file  Other Topics Concern  . Not on file  Social History Narrative  . Not on file  Family History  Problem Relation Age of Onset  . Ovarian cancer Mother   . Diabetes Mellitus II Brother       Review of Systems: All other systems reviewed and are otherwise negative except as noted above.  Physical Exam: Vitals:   08/31/18 2347 09/01/18 0423 09/01/18 0756 09/01/18 1233  BP: (!) 128/109 (!) 126/53 (!) 132/53 (!) 163/67  Pulse: 67 (!) 55 (!) 53 (!) 57  Resp: 17  18 16   Temp:  97.9 F (36.6 C) 98 F (36.7 C) 98.1 F (36.7 C)  TempSrc:  Oral Oral Oral  SpO2: 98% 97% 96% 96%  Weight:      Height:        GEN- The patient is well appearing, thin/frail body habitus, she is alert and oriented x 3 today.   Head- normocephalic, atraumatic Eyes-  Sclera clear, conjunctiva pink Ears- hearing intact Oropharynx- clear Neck- supple Lungs- CTA b/l, normal work of breathing Heart- RRR, no murmurs, rubs or gallops  GI- soft, NT, ND Extremities- no clubbing, cyanosis, or edema MS- no significant deformity, age appropriate atrophy Skin- no rash or lesion Psych- euthymic mood, full affect   Labs:   Lab Results  Component Value Date   WBC 5.7 09/01/2018   HGB 12.1 09/01/2018   HCT 38.6  09/01/2018   MCV 99.0 09/01/2018   PLT 229 09/01/2018    Recent Labs  Lab 09/01/18 0419  NA 141  K 4.8  CL 104  CO2 29  BUN 21  CREATININE 1.05*  CALCIUM 9.0  PROT 6.0*  BILITOT 1.2  ALKPHOS 58  ALT 11  AST 25  GLUCOSE 93   No results found for: CKTOTAL, CKMB, CKMBINDEX, TROPONINI Lab Results  Component Value Date   CHOL 119 08/31/2018   CHOL 149 12/24/2016   CHOL 140 06/17/2014   Lab Results  Component Value Date   HDL 60 08/31/2018   HDL 61.10 12/24/2016   HDL 62.40 06/17/2014   Lab Results  Component Value Date   LDLCALC 52 08/31/2018   LDLCALC 76 12/24/2016   LDLCALC 62 06/17/2014   Lab Results  Component Value Date   TRIG 34 08/31/2018   TRIG 59.0 12/24/2016   TRIG 79.0 06/17/2014   Lab Results  Component Value Date   CHOLHDL 2.0 08/31/2018   CHOLHDL 2 12/24/2016   CHOLHDL 2 06/17/2014   No results found for: LDLDIRECT  No results found for: DDIMER   Radiology/Studies:   Ct Head Wo Contrast Result Date: 08/30/2018 CLINICAL DATA:  Left leg weakness, slurred speech. EXAM: CT HEAD WITHOUT CONTRAST TECHNIQUE: Contiguous axial images were obtained from the base of the skull through the vertex without intravenous contrast. COMPARISON:  None. FINDINGS: Brain: Mild diffuse cortical atrophy is noted. Mild chronic ischemic white matter disease is noted. No mass effect or midline shift is noted. Ventricular size is within normal limits. There is no evidence of mass lesion, hemorrhage or acute infarction. Vascular: No hyperdense vessel or unexpected calcification. Skull: Normal. Negative for fracture or focal lesion. Sinuses/Orbits: Left sphenoid sinusitis is noted. Other: None. IMPRESSION: Mild diffuse cortical atrophy. Mild chronic ischemic white matter disease. Left sphenoid sinusitis. No acute intracranial abnormality seen. Electronically Signed   By: Lupita Raider, M.D.   On: 08/30/2018 13:08    Mr Brain Wo Contrast Result Date: 08/30/2018 CLINICAL DATA:   Focal neuro deficit for greater than 6 hours, stroke suspected. Altered level of consciousness. Abnormal speech. Last seen normal at 6 p.m.  yesterday. Possible left-sided facial droop. EXAM: MRI HEAD WITHOUT CONTRAST TECHNIQUE: Multiplanar, multiecho pulse sequences of the brain and surrounding structures were obtained without intravenous contrast. COMPARISON:  CT head without contrast 08/30/2018 FINDINGS: Brain: An acute nonhemorrhagic infarct is present posteriorly in the right insular cortex. There is also an acute cortical infarct in the right parietal lobe. No additional infarcts are present. T2 signal changes are present in the right insular cortex. There is no definite T2 change in the right parietal infarct. Moderate atrophy is present. Moderate diffuse confluent periventricular and subcortical T2 hyperintensities are present bilaterally. The ventricles are proportionate to the degree of atrophy. The brainstem is within normal limits.  Cerebellum is unremarkable. Vascular: Flow is present in the major intracranial arteries. Skull and upper cervical spine: The skull base is within normal limits. Craniocervical junction is intact. Central canal narrowing is present at C3-4 due to endplate changes. Marrow signal is normal. Sinuses/Orbits: The paranasal sinuses and mastoid air cells are clear. Bilateral lens replacements are present. Globes and orbits are otherwise within normal limits. IMPRESSION: 1. Two small acute/subacute cortical infarcts involving the right insular cortex and right parietal lobe. 2. Moderate atrophy and diffuse white matter disease is also present. This likely reflects the sequela of chronic microvascular ischemia. Electronically Signed   By: Marin Roberts M.D.   On: 08/30/2018 17:37    Mr Maxine Glenn Head Wo Contrast Result Date: 08/30/2018 CLINICAL DATA:  Follow-up subacute infarcts. EXAM: MRA HEAD WITHOUT CONTRAST TECHNIQUE: Angiographic images of the Circle of Willis were obtained  using MRA technique without intravenous contrast. COMPARISON:  MRI head August 30, 2018 FINDINGS: ANTERIOR CIRCULATION: Normal flow related enhancement of the included cervical, petrous, cavernous and supraclinoid internal carotid arteries. Patent anterior communicating artery. 1-2 mm superiorly directed aneurysm LEFT A1-2 junction. Patent anterior and middle cerebral arteries. No large vessel occlusion, flow limiting stenosis. POSTERIOR CIRCULATION: RIGHT vertebral artery is dominant. Vertebrobasilar arteries are patent, with normal flow related enhancement of the main branch vessels. Mildly fenestrated basilar tip. Patent posterior cerebral arteries. Small RIGHT posterior communicating artery present. No large vessel occlusion, flow limiting stenosis,  aneurysm. ANATOMIC VARIANTS: None. Source images and MIP images were reviewed. IMPRESSION: 1. No emergent large vessel occlusion or flow-limiting stenosis. 2. 1-2 mm LEFT A-comm aneurysm. Electronically Signed   By: Awilda Metro M.D.   On: 08/30/2018 20:36    12-lead ECG SB/SR, RBBB All prior EKG's in EPIC reviewed with no documented atrial fibrillation  Telemetry SB/SR  Assessment and Plan:  1. Cryptogenic stroke The patient presents with cryptogenic stroke.  The patient has a TEE planned for this PM.  I spoke at length with the patient (and family at bedside) about monitoring for afib with either a 30 day event monitor or an implantable loop recorder.  Risks, benefits, and alteratives to implantable loop recorder were discussed with the patient today.   She would prefer to have the wearable monitor as a first step, she had a significant episode of palpitations the evening prior to her stroke, and proceeding with a 30 day event monitor given this is reasonable.     Dr. Ladona Ridgel will see later today   Sheilah Pigeon, PA-C 09/01/2018  EP attending  Patient seen and examined.  I agree with the findings as noted above.  The patient is an  84 year old woman who presented with a stroke and has undergone TEE demonstrating no clear-cut etiology for her stroke.  Her deficits and CT results have been documented above.  The patient's history is quite interesting and that she experienced palpitations associated with neck discomfort and jaw discomfort prior to the stroke.  Her symptoms of palpitations resolved and her stroke followed.  Since she has been in the hospital, telemetry demonstrates no evidence of atrial fibrillation.  She has had almost complete improvement from her stroke and review of her telemetry demonstrates sinus rhythm.  I discussed the treatment options in detail with the patient.  I think it is highly likely that her stroke was caused by atrial fibrillation but we have no documentation.  I think it is also highly likely that she will have recurrent atrial fibrillation, likely sooner than later.  For this reason I think it is reasonable to have the patient undergo a 30-day monitor.  If she does not demonstrate atrial fibrillation, and insertion of an implantable loop recorder would be reasonable.  She is stable for discharge from our perspective.  Lewayne Bunting, MD

## 2018-09-01 NOTE — Progress Notes (Signed)
Occupational Therapy Treatment Patient Details Name: EOWYN HARBERTS MRN: [id number] DOB: Nov 13, 1928 Today's Date: 09/01/2018    History of present illness Patient is a 84 y/o female who presents with left sided facial droop, difficulty speaking and trouble with balance. Head CT-unremarkable. Brain MRI- 2 small acute/subacute cortical infarcts right insular cortex and right parietal lobe. NIh:0. PMH includes COPD, CHF, macular degeneration, CAD, HLD.   OT comments  Patient eager and willing to participate in OT session. Educated on fall prevention and safety awareness, patient agreeable to recommendations.  Patient demonstrates ability to complete tub transfers with supervision, reviewed safety with bathing seated as patient attempting to stand on 1 foot simulating while washing feet.  Patient hesitant, but overall agreeable to recommendation.  Attempted med management assessment, but unable to d/t visual deficits, although able to verbalize management of medication prior to admission (can tell medications by shape of pill); agreeable to have assistance initially if medications change.  Will continue to follow to ensure recall of education provided.      Follow Up Recommendations  No OT follow up;Supervision - Intermittent    Equipment Recommendations  None recommended by OT    Recommendations for Other Services      Precautions / Restrictions Precautions Precautions: Fall Restrictions Weight Bearing Restrictions: No       Mobility Bed Mobility Overal bed mobility: Modified Independent                Transfers Overall transfer level: Modified independent Equipment used: None                  Balance Overall balance assessment: Needs assistance Sitting-balance support: Feet supported;No upper extremity supported Sitting balance-Leahy Scale: Good     Standing balance support: During functional activity;No upper extremity supported Standing balance-Leahy Scale:  Good                             ADL either performed or assessed with clinical judgement   ADL Overall ADL's : Needs assistance/impaired             Lower Body Bathing: Set up;Supervison/ safety;Sit to/from stand;Cueing for safety Lower Body Bathing Details (indicate cue type and reason): reviewed bathing seated in shower for safety          Toilet Transfer: Supervision/safety;Ambulation       Tub/ Shower Transfer: Tub transfer;Supervision/safety;Ambulation;3 in 1 Tub/Shower Transfer Details (indicate cue type and reason): encouarged patient to use 3:1 she owns as shower chair for increased safety in shower  Functional mobility during ADLs: Supervision/safety;Cueing for safety General ADL Comments: Patient agreeable to shower chair in shower. Reviewed fall prevention and safety awareness.      Vision       Perception     Praxis      Cognition Arousal/Alertness: Awake/alert Behavior During Therapy: WFL for tasks assessed/performed Overall Cognitive Status: History of cognitive impairments - at baseline Area of Impairment: Memory;Attention;Awareness                   Current Attention Level: Alternating Memory: Decreased short-term memory     Awareness: Emergent   General Comments: Attempted medication management with patient (medi-cog) and unable to dt vision and patient needing specific magnifying glass with light.  Pt able to verbalize medication needs prior to admission, but educated patient on pill box benefits and assistance if medications changes post dc. Pt agreeable.  Exercises     Shoulder Instructions       General Comments family present during session    Pertinent Vitals/ Pain       Pain Assessment: No/denies pain  Home Living                                          Prior Functioning/Environment              Frequency  Min 2X/week        Progress Toward Goals  OT Goals(current  goals can now be found in the care plan section)  Progress towards OT goals: Progressing toward goals  Acute Rehab OT Goals Patient Stated Goal: to go home OT Goal Formulation: With patient Time For Goal Achievement: 09/14/18 Potential to Achieve Goals: Good  Plan Discharge plan remains appropriate;Frequency remains appropriate    Co-evaluation                 AM-PAC PT "6 Clicks" Daily Activity     Outcome Measure   Help from another person eating meals?: None Help from another person taking care of personal grooming?: None Help from another person toileting, which includes using toliet, bedpan, or urinal?: None Help from another person bathing (including washing, rinsing, drying)?: None Help from another person to put on and taking off regular upper body clothing?: None Help from another person to put on and taking off regular lower body clothing?: None 6 Click Score: 24    End of Session Equipment Utilized During Treatment: Gait belt  OT Visit Diagnosis: Unsteadiness on feet (R26.81);Other abnormalities of gait and mobility (R26.89);Low vision, both eyes (H54.2)   Activity Tolerance Patient tolerated treatment well   Patient Left in bed;with call bell/phone within reach;with family/visitor present   Nurse Communication Mobility status        Time: 1340-1400 OT Time Calculation (min): 20 min  Charges: OT General Charges $OT Visit: 1 Visit OT Treatments $Self Care/Home Management : 8-22 mins  Chancy Milroy, OT Acute Rehabilitation Services Pager [phone] Office [phone]    Chancy Milroy 09/01/2018, 3:32 PM

## 2018-09-01 NOTE — Interval H&P Note (Signed)
History and Physical Interval Note:  09/01/2018 3:57 PM  Holly [name]  has presented today for surgery, with the diagnosis of STROKE  The various methods of treatment have been discussed with the patient and family. After consideration of risks, benefits and other options for treatment, the patient has consented to  Procedure(s): TRANSESOPHAGEAL ECHOCARDIOGRAM (TEE) (N/A) as a surgical intervention .  The patient's history has been reviewed, patient examined, no change in status, stable for surgery.  I have reviewed the patient's chart and labs.  Questions were answered to the patient's satisfaction.     Doratha Mcswain Harrell Gave

## 2018-09-01 NOTE — Progress Notes (Signed)
NEUROHOSPITALISTS STROKE TEAM - DAILY PROGRESS NOTE       SUBJECTIVE Patient seen in bed with daughter at the bedside.  She has no complaints. She is awaiting TEE and loop recorder today prior to discharge therapy assessment suggest home therapy OBJECTIVE Most recent Vital Signs: Vitals:   08/31/18 2347 09/01/18 0423 09/01/18 0756 09/01/18 1233  BP: (!) 128/109 (!) 126/53 (!) 132/53 (!) 163/67  Pulse: 67 (!) 55 (!) 53 (!) 57  Resp: 17  18 16   Temp:  97.9 F (36.6 C) 98 F (36.7 C) 98.1 F (36.7 C)  TempSrc:  Oral Oral Oral  SpO2: 98% 97% 96% 96%  Weight:      Height:       CBG (last 3)  No results for input(s): GLUCAP in the last 72 hours.  Physical Exam  HEENT-  Normocephalic, no lesions, without obvious abnormality.  Normal external eye and conjunctiva.   Cardiovascular- S1-S2 audible, pulses palpable throughout   Lungs-no rhonchi or wheezing noted, no excessive working breathing.   Abdomen-normal bowel sounds Musculoskeletal-no joint tenderness, deformity or swelling Skin-warm and dry,  Neuro:  Mental Status: Alert, oriented, thought content appropriate.  Speech fluent without evidence of aphasia.  Able to follow 3 step commands without difficulty. Cranial Nerves: II:  Visual fields grossly normal,  III,IV, VI: ptosis not present, extra-ocular motions intact bilaterally pupils equal, round, reactive to light and accommodation V,VII: smile symmetric, facial light touch sensation normal bilaterally VIII: hearing intact to voice IX,X: uvula rises symmetrically XI: bilateral shoulder shrug XII: midline tongue extension Motor: Moves all extremities equally without weakness or drift Right : Upper extremity   5/5    Left:     Upper extremity   5/5  Lower extremity   5/5     Lower extremity   5/5 Tone and bulk:normal tone throughout; no atrophy noted Sensory: Sensation to light touch intact throughout,  bilaterally Cerebellar: normal finger-to-nose and normal rapid alternating movements  Gait: Not tested  IV Fluid Intake:    MEDICATIONS  . aspirin EC  81 mg Oral Daily  . atorvastatin  40 mg Oral q1800  . clopidogrel  75 mg Oral Daily  . enoxaparin (LOVENOX) injection  30 mg Subcutaneous Q24H   PRN:  acetaminophen **OR** acetaminophen (TYLENOL) oral liquid 160 mg/5 mL **OR** acetaminophen, ondansetron **OR** ondansetron (ZOFRAN) IV  Diet:   Diet Order            Diet NPO time specified Except for: Sips with Meds  Diet effective midnight               CLINICALLY SIGNIFICANT STUDIES Basic Metabolic Panel:  Recent Labs  Lab 08/30/18 1222 08/30/18 1251 09/01/18 0419  NA 140 140 141  K 3.7 4.3 4.8  CL 107 105 104  CO2 25  --  29  GLUCOSE 88 81 93  BUN 22 34* 21  CREATININE 1.12* 1.20* 1.05*  CALCIUM 9.4  --  9.0   Liver Function Tests:  Recent Labs  Lab 08/30/18 1222 09/01/18 0419  AST 25 25  ALT 12 11  ALKPHOS 64 58  BILITOT 1.1 1.2  PROT 6.6 6.0*  ALBUMIN 3.7 3.3*  CBC:  Recent Labs  Lab 08/30/18 1222 08/30/18 1251 09/01/18 0419  WBC 5.4  --  5.7  NEUTROABS 3.3  --   --   HGB 12.6 13.6 12.1  HCT 41.0 40.0 38.6  MCV 100.5*  --  99.0  PLT 231  --  229   Coagulation:  Recent Labs  Lab 08/30/18 1222  LABPROT 12.1  INR 0.90    Lipid Panel    Component Value Date/Time   CHOL 119 08/31/2018 0500   TRIG 34 08/31/2018 0500   HDL 60 08/31/2018 0500   CHOLHDL 2.0 08/31/2018 0500   VLDL 7 08/31/2018 0500   LDLCALC 52 08/31/2018 0500   HgbA1C  Lab Results  Component Value Date   HGBA1C 5.4 08/31/2018    Urine Drug Screen:  No results found for: LABOPIA, COCAINSCRNUR, LABBENZ, AMPHETMU, THCU, LABBARB  Alcohol Level: No results for input(s): ETH in the last 168 hours.  Ct Head Wo Contrast  08/30/2018 IMPRESSION: Mild diffuse cortical atrophy. Mild chronic ischemic white matter disease. Left sphenoid sinusitis. No acute intracranial  abnormality seen.  Mr Brain Wo Contrast 08/30/2018 IMPRESSION: 1. Two small acute/subacute cortical infarcts involving the right insular cortex and right parietal lobe. 2. Moderate atrophy and diffuse white matter disease is also present. This likely reflects the sequela of chronic microvascular ischemia.  Mr Jodene Nam Head Wo Contrast  08/30/2018 IMPRESSION: 1. No emergent large vessel occlusion or flow-limiting stenosis. 2. 1-2 mm LEFT A-comm aneurysm.   Carotid Doppler 08/31/18 Final Interpretation: Right Carotid: The extracranial vessels were near-normal with only minimal wall        thickening or plaque.  Left Carotid: The extracranial vessels were near-normal with only minimal wall       thickening or plaque.  Vertebrals: Bilateral vertebral arteries demonstrate antegrade flow. Subclavians: Normal flow hemodynamics were seen in bilateral subclavian       arteries.  Echo 08/31/18 Study Conclusions - Left ventricle: The cavity size was normal. Wall thickness was   normal. Systolic function was vigorous. The estimated ejection   fraction was in the range of 65% to 70%. - Aortic valve: AV is thickened, calcified with mildly restricted   motion. Peak and mean gradients through the valve are 27 and 14   mm Hg respectively. consistent with mild AS. There was trivial   regurgitation. - Right ventricle: Systolic function was mildly reduced.  EKG sinus rhythm with right bundle branch block, for complete results please see formal report.   Outstanding Stroke Work-up Studies:     TEE with loop recorder    pending   ASSESSMENT/PLAN Stroke:   Small acute infarcts in the right parietal lobe and insula likely embolic  with resulting left facial droop,  slurred speech, gait instability. Slurred Speech has since resolved  Code Stroke: No  CTA head & neck : Performed CT of the brain:  Mild diffuse cortical atrophy. Mild chronic ischemic white matter disease. Left sphenoid  sinusitis  MRI of the brain : Two small acute/subacute cortical infarcts involving the right insular cortex and right parietal lobe  MRA of the brain : No emergent large vessel occlusion or flow-limiting stenosis, 1-2 mm LEFT A-comm aneurysm.  Carotid Doppler: The extracranial vessels were near-normal with only minimal wall thickening or plaque. Bilateral vertebral arteries demonstrate antegrade flow  2D Echocardiogram ejection fraction 65 to 66% with systolic function was vigorous, no mention of LV thrombus   LDL  52   HgbA1c  pending  VTE Lovenox  Antiplatelets:None At home - Patient not on ASA due to advice from her ophthalmologist for macular degeneration  Started on aspirin 81 and Plavix 75  mg while in hospital for 3 weeks and then continue Plavix alone  Atorvastatin 40 mg   Continue Rehab with PT consult, OT consult, Speech consult  Therapy recommendations:   Outpatient physical therapy  Disposition:   Pending  Patient with  embolic stroke and no evidence of A. fib on tele or history of A. Fib, patient does complain of palpitations, further investigation for possible source of stroke pending with a TEE with loop recorder in the a.m.  Follow-up with [facility] neurology    Hypertension  Elevated : SBP 110 - 130s  Home medications: None  BP goal normotensive   Hyperlipidemia  Home meds: Atorvastatin 10 mg    LDL 52 goal < 70  Add lipitor 40mg  mg daily  Continue statin at discharge   Other Stroke Risk Factors  Advanced age  CAD  CHF-not in exacerbation  COPD   Other Active Problems  GERD  Hypothyroidism  Macular degeneration  Hospital day # 2        She presented with transient slurred speech and left facial droop due to small embolic right insular and parietal cortical infarcts in etiology to be determined. Continue cardiac telemetry and check transesophageal echocardiogram for cardiac source of embolism and loop recorder for paroxysmal A. Fib.  Recommend dual antiplatelet therapy of aspirin and Plavix for 3 weeks followed by aspirin alone. Long discussion with the patient and  daughter and answered questions about her care.Likely discharge home today after TEE and loop recorder. Consider possible participation in the [place] trial for stroke prevention if interested. Greater than 50% time during this 25 minute visit was spent on counseling and coordination of care about a stroke and discussion with the care team and answering questions Antony Contras, MD Medical Director [place] Pager: [phone] 09/01/2018 12:33 PM   To contact Stroke Continuity provider, please refer to [email or web address] After hours, contact General Neurology

## 2018-09-01 NOTE — Progress Notes (Signed)
Patient discharged in stable condition with all belongings and daughter at bedside. They both verbalized understanding of all discharge instructions and importance of follow up visits.

## 2018-09-01 NOTE — CV Procedure (Signed)
Brief TEE note, full report to follow in [facility]:  No LA/LAA thrombus, no RA/RAA thrombus. No PFO by color doppler. No R-L shunt by agitated saline.  No cardiac source of embolism.  Aortic valve is trileaflet but thickened. Visually opens with only mild restriction. Trace AI.  During this procedure the patient is administered a total of Versed 2 mg and Fentanyl 25 mcg to achieve and maintain moderate conscious sedation.  The patient's heart rate, blood pressure, and oxygen saturation are monitored continuously during the procedure. The period of conscious sedation is 17 minutes, of which I was present face-to-face 100% of this time.  Buford Dresser, MD, PhD [facility]  [address], [place], [place] [address] [phone]

## 2018-09-01 NOTE — Discharge Summary (Addendum)
[facility]  Physician Discharge Summary   Patient ID: Holly [name] MRN: [id number] DOB/AGE: 07/13/1928 84 y.o.  Admit date: 08/30/2018 Discharge date: 09/01/2018  PCP: Gordy Savers, MD  DISCHARGE DIAGNOSES:  Acute stroke Hypothyroidism Essential hypertension  RECOMMENDATIONS FOR OUTPATIENT FOLLOW UP: 1. Patient's Synthroid dose has been increased as discussed below.  Please recheck thyroid function tests in 4 to 5 weeks. 2. Patient follow-up with neurology for stroke as well as for aneurysm seen on MRI.  3. Patient apparently declined loop recorder placement.  Message sent to cardiology office to arrange 30-day monitor.  DISCHARGE CONDITION: fair  Diet recommendation: Heart healthy  Filed Weights   08/30/18 2040 08/30/18 2358 08/30/18 2359  Weight: 51.1 kg 49 kg 49 kg    INITIAL HISTORY: 84 y.o.femalewith medical history significant ofCAD, cellulitis, unspecified CHF COPD, GERD, hyperlipidemia, hypothyroidism, macular degeneration, history of urosepsis who is brought by her daughter after she had a facial drooling the day before admission around 1730.  Symptoms had subsided so they did not call EMS noted they seek medical attention.  They subsequently called PCP on the day of admission who asked him to come into the emergency department.  Patient's MRI was concerning for embolic stroke.  Patient was hospitalized for further management.  Consultants: Neurology  Procedures:  Transthoracic echocardiogram Study Conclusions  - Left ventricle: The cavity size was normal. Wall thickness was   normal. Systolic function was vigorous. The estimated ejection   fraction was in the range of 65% to 70%. - Aortic valve: AV is thickened, calcified with mildly restricted   motion. Peak and mean gradients through the valve are 27 and 14   mm Hg respectively. consistent with mild AS. There was trivial   regurgitation. - Right ventricle: Systolic function was mildly  reduced.  TEE Brief TEE note, full report to follow in [facility]: No LA/LAA thrombus, no RA/RAA thrombus. No PFO by color doppler. No R-L shunt by agitated saline. No cardiac source of embolism. Aortic valve is trileaflet but thickened. Visually opens with only mild restriction. Trace AI.   HOSPITAL COURSE:   Acute stroke Concern is for acute embolic stroke.    Patient was seen by neurology.  No significant stenosis noted on MRA.  Carotid Doppler without any significant stenosis.  Echocardiogram as above.   LDL is 52.  HbA1c is 5.4. EKG showed sinus rhythm.  RBBB was noted which has been seen previously as well.    No arrhythmias noted on telemetry.  Patient was placed on aspirin.  Due to concern for embolic stroke patient underwent TEE which did not show any thrombus or PFO.  Loop recorder to be placed.  Patient will be discharged on aspirin and Plavix for 3 weeks followed by aspirin alone.  Deficits are very minimal.  Seen by physical and Occupational Therapy.  Home health has been ordered.  Patient apparently declined loop recorder placement.  Message sent to cardiology office to arrange 30-day monitor.  Palpitations No arrhythmias noted on telemetry.  Loop recorder to be placed.  TSH noted to be 11.9.  Patient noted to have episodes of bradycardia.  Her dose of Synthroid will be increased.    Hypothyroidism TSH noted to be 11.9.  Free T4 0.89.  Due to presence of bradycardia dose of Synthroid will be increased to 88 mcg.  Thyroid function test to be rechecked in the outpatient setting.  Dyslipidemia LDL is very well controlled at 52.  Continue statin.  History of coronary  atherosclerosis Continue statin.  History of COPD Continue home medications.  History of GERD Continue PPI.  Mildly elevated creatinine, likely chronic kidney disease stage III Renal function is stable.  1-2 mm LEFT A-comm aneurysm Incidentally noted on MRA.  Outpatient monitoring by neurology.  Overall  stable.  Okay for discharge home.    PERTINENT LABS:  The results of significant diagnostics from this hospitalization (including imaging, microbiology, ancillary and laboratory) are listed below for reference.     Labs: Basic Metabolic Panel: Recent Labs  Lab 08/30/18 1222 08/30/18 1251 09/01/18 0419  NA 140 140 141  K 3.7 4.3 4.8  CL 107 105 104  CO2 25  --  29  GLUCOSE 88 81 93  BUN 22 34* 21  CREATININE 1.12* 1.20* 1.05*  CALCIUM 9.4  --  9.0   Liver Function Tests: Recent Labs  Lab 08/30/18 1222 09/01/18 0419  AST 25 25  ALT 12 11  ALKPHOS 64 58  BILITOT 1.1 1.2  PROT 6.6 6.0*  ALBUMIN 3.7 3.3*   CBC: Recent Labs  Lab 08/30/18 1222 08/30/18 1251 09/01/18 0419  WBC 5.4  --  5.7  NEUTROABS 3.3  --   --   HGB 12.6 13.6 12.1  HCT 41.0 40.0 38.6  MCV 100.5*  --  99.0  PLT 231  --  229    IMAGING STUDIES Ct Head Wo Contrast  Result Date: 08/30/2018 CLINICAL DATA:  Left leg weakness, slurred speech. EXAM: CT HEAD WITHOUT CONTRAST TECHNIQUE: Contiguous axial images were obtained from the base of the skull through the vertex without intravenous contrast. COMPARISON:  None. FINDINGS: Brain: Mild diffuse cortical atrophy is noted. Mild chronic ischemic white matter disease is noted. No mass effect or midline shift is noted. Ventricular size is within normal limits. There is no evidence of mass lesion, hemorrhage or acute infarction. Vascular: No hyperdense vessel or unexpected calcification. Skull: Normal. Negative for fracture or focal lesion. Sinuses/Orbits: Left sphenoid sinusitis is noted. Other: None. IMPRESSION: Mild diffuse cortical atrophy. Mild chronic ischemic white matter disease. Left sphenoid sinusitis. No acute intracranial abnormality seen. Electronically Signed   By: Lupita Raider, M.D.   On: 08/30/2018 13:08   Mr Brain Wo Contrast  Result Date: 08/30/2018 CLINICAL DATA:  Focal neuro deficit for greater than 6 hours, stroke suspected. Altered  level of consciousness. Abnormal speech. Last seen normal at 6 p.m. yesterday. Possible left-sided facial droop. EXAM: MRI HEAD WITHOUT CONTRAST TECHNIQUE: Multiplanar, multiecho pulse sequences of the brain and surrounding structures were obtained without intravenous contrast. COMPARISON:  CT head without contrast 08/30/2018 FINDINGS: Brain: An acute nonhemorrhagic infarct is present posteriorly in the right insular cortex. There is also an acute cortical infarct in the right parietal lobe. No additional infarcts are present. T2 signal changes are present in the right insular cortex. There is no definite T2 change in the right parietal infarct. Moderate atrophy is present. Moderate diffuse confluent periventricular and subcortical T2 hyperintensities are present bilaterally. The ventricles are proportionate to the degree of atrophy. The brainstem is within normal limits.  Cerebellum is unremarkable. Vascular: Flow is present in the major intracranial arteries. Skull and upper cervical spine: The skull base is within normal limits. Craniocervical junction is intact. Central canal narrowing is present at C3-4 due to endplate changes. Marrow signal is normal. Sinuses/Orbits: The paranasal sinuses and mastoid air cells are clear. Bilateral lens replacements are present. Globes and orbits are otherwise within normal limits. IMPRESSION: 1. Two small acute/subacute cortical  infarcts involving the right insular cortex and right parietal lobe. 2. Moderate atrophy and diffuse white matter disease is also present. This likely reflects the sequela of chronic microvascular ischemia. Electronically Signed   By: Marin Roberts M.D.   On: 08/30/2018 17:37   Mr Maxine Glenn Head Wo Contrast  Result Date: 08/30/2018 CLINICAL DATA:  Follow-up subacute infarcts. EXAM: MRA HEAD WITHOUT CONTRAST TECHNIQUE: Angiographic images of the Circle of Willis were obtained using MRA technique without intravenous contrast. COMPARISON:  MRI head  August 30, 2018 FINDINGS: ANTERIOR CIRCULATION: Normal flow related enhancement of the included cervical, petrous, cavernous and supraclinoid internal carotid arteries. Patent anterior communicating artery. 1-2 mm superiorly directed aneurysm LEFT A1-2 junction. Patent anterior and middle cerebral arteries. No large vessel occlusion, flow limiting stenosis. POSTERIOR CIRCULATION: RIGHT vertebral artery is dominant. Vertebrobasilar arteries are patent, with normal flow related enhancement of the main branch vessels. Mildly fenestrated basilar tip. Patent posterior cerebral arteries. Small RIGHT posterior communicating artery present. No large vessel occlusion, flow limiting stenosis,  aneurysm. ANATOMIC VARIANTS: None. Source images and MIP images were reviewed. IMPRESSION: 1. No emergent large vessel occlusion or flow-limiting stenosis. 2. 1-2 mm LEFT A-comm aneurysm. Electronically Signed   By: Awilda Metro M.D.   On: 08/30/2018 20:36    DISCHARGE EXAMINATION: Vitals:   09/01/18 1625 09/01/18 1629 09/01/18 1630 09/01/18 1640  BP: (!) 186/71 (!) 169/79 (!) 150/65 (!) 141/61  Pulse: 70 63 61 (!) 56  Resp: 17 16 18 17   Temp:  99.1 F (37.3 C)    TempSrc:  Oral    SpO2: 98% 97% 94% 93%  Weight:      Height:       General appearance: alert, cooperative, appears stated age and no distress Resp: clear to auscultation bilaterally Cardio: regular rate and rhythm, S1, S2 normal, no murmur, click, rub or gallop GI: soft, non-tender; bowel sounds normal; no masses,  no organomegaly  DISPOSITION: Home with home health  Discharge Instructions    Ambulatory referral to Neurology   Complete by:  As directed    An appointment is requested in approximately: 2 weeks   Call MD for:  extreme fatigue   Complete by:  As directed    Call MD for:  persistant dizziness or light-headedness   Complete by:  As directed    Call MD for:  persistant nausea and vomiting   Complete by:  As directed    Call MD  for:  severe uncontrolled pain   Complete by:  As directed    Call MD for:  temperature >100.4   Complete by:  As directed    Diet - low sodium heart healthy   Complete by:  As directed    Discharge instructions   Complete by:  As directed    Your thyroid levels were noted to be abnormal.  Your TSH was high.  Your thyroid medication dose has been increased lately.  You should follow-up with your primary care provider to have your thyroid levels rechecked in about 4 to 5 weeks.  Follow-up with the neurologist.  Take your medications as prescribed.  You were cared for by a hospitalist during your hospital stay. If you have any questions about your discharge medications or the care you received while you were in the hospital after you are discharged, you can call the unit and asked to speak with the hospitalist on call if the hospitalist that took care of you is not available. Once you are discharged,  your primary care physician will handle any further medical issues. Please note that NO REFILLS for any discharge medications will be authorized once you are discharged, as it is imperative that you return to your primary care physician (or establish a relationship with a primary care physician if you do not have one) for your aftercare needs so that they can reassess your need for medications and monitor your lab values. If you do not have a primary care physician, you can call [phone] for a physician referral.   Increase activity slowly   Complete by:  As directed         Allergies as of 09/01/2018      Reactions   Levofloxacin    REACTION: unspecified   Macrobid [nitrofurantoin Macrocrystal] Nausea Only   Sulfamethoxazole    REACTION: unspecified   Tramadol Other (See Comments)   Shakes   Amoxicillin Rash   Has patient had a PCN reaction causing immediate rash, facial/tongue/throat swelling, SOB or lightheadedness with hypotension: No Has patient had a PCN reaction causing severe rash  involving mucus membranes or skin necrosis: No Has patient had a PCN reaction that required hospitalization: No Has patient had a PCN reaction occurring within the last 10 years: No If all of the above answers are "NO", then may proceed with Cephalosporin use. Made "bottom area" very raw      Medication List    TAKE these medications   albuterol 108 (90 Base) MCG/ACT inhaler Commonly known as:  PROVENTIL HFA;VENTOLIN HFA Inhale 2 puffs into the lungs every 6 (six) hours as needed. What changed:    how much to take  reasons to take this   aspirin 81 MG EC tablet Take 1 tablet (81 mg total) by mouth daily. Start taking on:  09/02/2018   atorvastatin 40 MG tablet Commonly known as:  LIPITOR Take 1 tablet (40 mg total) by mouth daily at 6 PM. What changed:    medication strength  how much to take  when to take this   clopidogrel 75 MG tablet Commonly known as:  PLAVIX Take 1 tablet (75 mg total) by mouth daily for 21 days. Start taking on:  09/02/2018   ICAPS PO Take 1 capsule by mouth 2 (two) times daily.   levothyroxine 88 MCG tablet Commonly known as:  SYNTHROID, LEVOTHROID Take 1 tablet (88 mcg total) by mouth daily. What changed:    medication strength  how much to take   WIXELA INHUB 500-50 MCG/DOSE Aepb Generic drug:  Fluticasone-Salmeterol Inhale 1 puff into the lungs 2 (two) times daily. What changed:  Another medication with the same name was removed. Continue taking this medication, and follow the directions you see here.        Follow-up Information    Micki Riley, MD Follow up.   Specialties:  Neurology, Radiology Why:  His office will call you for an appointment Contact information: [address] [place] [address] [phone]           TOTAL DISCHARGE TIME: 35 minutes  Osvaldo Shipper  [facility] Pager [phone]  09/01/2018, 5:04 PM

## 2018-09-02 ENCOUNTER — Encounter (HOSPITAL_COMMUNITY): Payer: Self-pay | Admitting: Cardiology

## 2018-09-02 LAB — HEMOGLOBIN A1C
Hgb A1c MFr Bld: 5.3 % (ref 4.8–5.6)
Mean Plasma Glucose: 105 mg/dL

## 2018-09-04 ENCOUNTER — Telehealth: Payer: Self-pay | Admitting: Internal Medicine

## 2018-09-04 ENCOUNTER — Telehealth: Payer: Self-pay | Admitting: Family Medicine

## 2018-09-04 DIAGNOSIS — J449 Chronic obstructive pulmonary disease, unspecified: Secondary | ICD-10-CM | POA: Diagnosis not present

## 2018-09-04 DIAGNOSIS — I480 Paroxysmal atrial fibrillation: Secondary | ICD-10-CM | POA: Diagnosis not present

## 2018-09-04 DIAGNOSIS — I69351 Hemiplegia and hemiparesis following cerebral infarction affecting right dominant side: Secondary | ICD-10-CM | POA: Diagnosis not present

## 2018-09-04 DIAGNOSIS — E039 Hypothyroidism, unspecified: Secondary | ICD-10-CM | POA: Diagnosis not present

## 2018-09-04 DIAGNOSIS — I509 Heart failure, unspecified: Secondary | ICD-10-CM | POA: Diagnosis not present

## 2018-09-04 DIAGNOSIS — Z7951 Long term (current) use of inhaled steroids: Secondary | ICD-10-CM | POA: Diagnosis not present

## 2018-09-04 DIAGNOSIS — I671 Cerebral aneurysm, nonruptured: Secondary | ICD-10-CM | POA: Diagnosis not present

## 2018-09-04 DIAGNOSIS — H3530 Unspecified macular degeneration: Secondary | ICD-10-CM | POA: Diagnosis not present

## 2018-09-04 DIAGNOSIS — Z87891 Personal history of nicotine dependence: Secondary | ICD-10-CM | POA: Diagnosis not present

## 2018-09-04 DIAGNOSIS — I110 Hypertensive heart disease with heart failure: Secondary | ICD-10-CM | POA: Diagnosis not present

## 2018-09-04 DIAGNOSIS — E785 Hyperlipidemia, unspecified: Secondary | ICD-10-CM | POA: Diagnosis not present

## 2018-09-04 DIAGNOSIS — I2510 Atherosclerotic heart disease of native coronary artery without angina pectoris: Secondary | ICD-10-CM | POA: Diagnosis not present

## 2018-09-04 DIAGNOSIS — Z7982 Long term (current) use of aspirin: Secondary | ICD-10-CM | POA: Diagnosis not present

## 2018-09-04 DIAGNOSIS — Z79899 Other long term (current) drug therapy: Secondary | ICD-10-CM | POA: Diagnosis not present

## 2018-09-04 DIAGNOSIS — R9082 White matter disease, unspecified: Secondary | ICD-10-CM | POA: Diagnosis not present

## 2018-09-04 NOTE — Telephone Encounter (Unsigned)
Copied from [place] [id number]. Topic: General - Other >> Sep 04, 2018 11:58 AM Yvette Rack wrote: Reason for CRM: Lenell Antu with [facility] requests verbal orders for PT for 2 times a week for 2 weeks and 1 time a week for 1 week. Cb# [phone]. Ok to leave message if no answer

## 2018-09-04 NOTE — Telephone Encounter (Addendum)
Transition Care Management Follow-up Telephone Call   Questions answered by Daine Floras (daughter)  Date discharged? 09/01/2018   How have you been since you were released from the hospital? Doing well per daughter.  Cannot keep her still.   Do you understand why you were in the hospital? yes   Do you understand the discharge instructions? yes   Where were you discharged to? Home   Items Reviewed:  Medications reviewed: yes  Allergies reviewed: yes  Dietary changes reviewed: yes/low sodium  Referrals reviewed: yes   Functional Questionnaire:   Activities of Daily Living (ADLs):   She states they are independent in the following: ambulation, bathing and hygiene, feeding, continence, grooming, toileting and dressing States they require assistance with the following: N/A   Any transportation issues/concerns?: no   Any patient concerns? no   Confirmed importance and date/time of follow-up visits scheduled yes  Provider Appointment booked with Dimas Chyle  Confirmed with patient if condition begins to worsen call PCP or go to the ER.  Patient was given the office number and encouraged to call back with question or concerns.  : yes

## 2018-09-04 NOTE — Consult Note (Signed)
[facility] CM Primary Care Navigator  09/04/2018  JENIA ANZALDUA 1928/10/21 [id number]   Wenttoseepatient at the bedsideto identify possible discharge needs but she was alreadydischargedover the weekend per staff.  According toInpatient CM note, patient went home with home health services.  Per MD note, patient was hospitalized for further evaluation and management of acute embolic stroke.   Primary care provider's officeis listed asprovidingtransition of care (TOC).  Patient hasdischarge instruction to follow-up with cardiology in 3 days, neurology follow-up (office will call for appointment) and has a scheduled follow-up with primary care provider Jacquiline Doe) in 3 days and in 2 weeks.  Noted order for EMMI stroke calls already in place after discharge.   For additional questions please contact:  Karin Golden A. Adalei Novell, BSN, RN-BC [facility] PRIMARY CARE Navigator Cell: [phone]

## 2018-09-04 NOTE — Care Management Important Message (Signed)
Important Message  Patient Details  Name: Holly [name] MRN: [id number] Date of Birth: 07-22-1928   Medicare Important Message Given:  Yes   Patient signed on 09/01/18 Orbie Pyo 09/04/2018, 8:01 AM

## 2018-09-05 ENCOUNTER — Encounter: Payer: Self-pay | Admitting: Cardiology

## 2018-09-05 ENCOUNTER — Telehealth: Payer: Self-pay

## 2018-09-05 ENCOUNTER — Other Ambulatory Visit: Payer: Self-pay | Admitting: Internal Medicine

## 2018-09-05 ENCOUNTER — Encounter (INDEPENDENT_AMBULATORY_CARE_PROVIDER_SITE_OTHER): Payer: Medicare Other

## 2018-09-05 ENCOUNTER — Ambulatory Visit (INDEPENDENT_AMBULATORY_CARE_PROVIDER_SITE_OTHER): Payer: Medicare Other | Admitting: Cardiology

## 2018-09-05 ENCOUNTER — Other Ambulatory Visit: Payer: Self-pay | Admitting: Cardiology

## 2018-09-05 DIAGNOSIS — R001 Bradycardia, unspecified: Secondary | ICD-10-CM | POA: Diagnosis not present

## 2018-09-05 DIAGNOSIS — I6340 Cerebral infarction due to embolism of unspecified cerebral artery: Secondary | ICD-10-CM

## 2018-09-05 DIAGNOSIS — E038 Other specified hypothyroidism: Secondary | ICD-10-CM | POA: Diagnosis not present

## 2018-09-05 DIAGNOSIS — I6359 Cerebral infarction due to unspecified occlusion or stenosis of other cerebral artery: Secondary | ICD-10-CM | POA: Diagnosis not present

## 2018-09-05 DIAGNOSIS — R002 Palpitations: Secondary | ICD-10-CM | POA: Diagnosis not present

## 2018-09-05 DIAGNOSIS — E785 Hyperlipidemia, unspecified: Secondary | ICD-10-CM

## 2018-09-05 DIAGNOSIS — J449 Chronic obstructive pulmonary disease, unspecified: Secondary | ICD-10-CM | POA: Diagnosis not present

## 2018-09-05 DIAGNOSIS — H3530 Unspecified macular degeneration: Secondary | ICD-10-CM

## 2018-09-05 NOTE — Progress Notes (Signed)
09/05/2018 Holly [name]   [birth year]  [id number]  Primary Physician Gordy Savers, MD Primary Cardiologist: Dr Ladona Ridgel  HPI:  Pleasant 84 y/o female with no prior cardiac history admitted 08/30/18 with small embolic RMCA stroke. She was placed on ASA and Plvix with plans to stop the Plavix after three weeks. Telemetry and TEE were unrevealing. The pt did give a pretty clear history of palpitations the night before the event, none since. She has baseline bradycardia so she is not on a beta blocker. She was discharged 09/01/18 with plans for a 30 day Monitor but th family tells me this can't be done until Oct 15th and the family expressed concern about the delay. She has recovered from her stroke without residual and has not had further palpitations.    Current Outpatient Medications  Medication Sig Dispense Refill  . albuterol (PROVENTIL HFA) 108 (90 Base) MCG/ACT inhaler Inhale 2 puffs into the lungs every 6 (six) hours as needed. (Patient taking differently: Inhale 1-2 puffs into the lungs every 6 (six) hours as needed for wheezing or shortness of breath. ) 20.1 each 5  . aspirin EC 81 MG EC tablet Take 1 tablet (81 mg total) by mouth daily. 30 tablet 3  . atorvastatin (LIPITOR) 40 MG tablet Take 1 tablet (40 mg total) by mouth daily at 6 PM. 30 tablet 0  . clopidogrel (PLAVIX) 75 MG tablet Take 1 tablet (75 mg total) by mouth daily for 21 days. 21 tablet 0  . Fluticasone-Salmeterol (WIXELA INHUB) 500-50 MCG/DOSE AEPB Inhale 1 puff into the lungs 2 (two) times daily.    Marland Kitchen levothyroxine (SYNTHROID, LEVOTHROID) 88 MCG tablet Take 1 tablet (88 mcg total) by mouth daily. 30 tablet 0  . Multiple Vitamins-Minerals (ICAPS PO) Take 1 capsule by mouth 2 (two) times daily.     No current facility-administered medications for this visit.     Allergies  Allergen Reactions  . Levofloxacin     REACTION: unspecified  . Macrobid [Nitrofurantoin Macrocrystal] Nausea Only  . Sulfamethoxazole     REACTION: unspecified  . Tramadol Other (See Comments)    Shakes   . Amoxicillin Rash    Has patient had a PCN reaction causing immediate rash, facial/tongue/throat swelling, SOB or lightheadedness with hypotension: No Has patient had a PCN reaction causing severe rash involving mucus membranes or skin necrosis: No Has patient had a PCN reaction that required hospitalization: No Has patient had a PCN reaction occurring within the last 10 years: No If all of the above answers are "NO", then may proceed with Cephalosporin use.   Made "bottom area" very raw    Past Medical History:  Diagnosis Date  . CAD (coronary artery disease)   . CELLULITIS 06/18/2008  . CHF (congestive heart failure) (HCC)   . CHRONIC OBSTRUCTIVE PULMONARY DISEASE, ACUTE EXACERBATION 11/16/2007  . COPD 05/18/2007  . GERD 12/25/2007  . HYPERLIPIDEMIA 05/18/2007  . HYPOTHYROIDISM 05/18/2007  . Macular degeneration   . Urosepsis     Social History   Socioeconomic History  . Marital status: Married    Spouse name: Not on file  . Number of children: Not on file  . Years of education: Not on file  . Highest education level: Not on file  Occupational History  . Not on file  Social Needs  . Financial resource strain: Not on file  . Food insecurity:    Worry: Not on file    Inability: Not on file  . Transportation  needs:    Medical: Not on file    Non-medical: Not on file  Tobacco Use  . Smoking status: Former Smoker    Last attempt to quit: 11/29/1985    Years since quitting: 32.7  . Smokeless tobacco: Never Used  Substance and Sexual Activity  . Alcohol use: No  . Drug use: No  . Sexual activity: Not on file  Lifestyle  . Physical activity:    Days per week: Not on file    Minutes per session: Not on file  . Stress: Not on file  Relationships  . Social connections:    Talks on phone: Not on file    Gets together: Not on file    Attends religious service: Not on file    Active member of club or  organization: Not on file    Attends meetings of clubs or organizations: Not on file    Relationship status: Not on file  . Intimate partner violence:    Fear of current or ex partner: Not on file    Emotionally abused: Not on file    Physically abused: Not on file    Forced sexual activity: Not on file  Other Topics Concern  . Not on file  Social History Narrative  . Not on file     Family History  Problem Relation Age of Onset  . Ovarian cancer Mother   . Diabetes Mellitus II Brother      Review of Systems: General: negative for chills, fever, night sweats or weight changes.  Cardiovascular: negative for chest pain, dyspnea on exertion, edema, orthopnea, palpitations, paroxysmal nocturnal dyspnea or shortness of breath Dermatological: negative for rash Respiratory: negative for cough or wheezing Urologic: negative for hematuria Abdominal: negative for nausea, vomiting, diarrhea, bright red blood per rectum, melena, or hematemesis Neurologic: negative for visual changes, syncope, or dizziness All other systems reviewed and are otherwise negative except as noted above.    Blood pressure (!) 110/58, pulse 68, height 5\' 2"  (1.575 m), weight 105 lb 9.6 oz (47.9 kg), SpO2 96 %.  General appearance: alert, cooperative, no distress and thin Neck: no carotid bruit and no JVD Lungs: clear, significant kyphosis Heart: regular rate and rhythm Extremities: no edema Skin: cool and dry Neurologic: Grossly normal  EKG NSR, SB, RBBB, LAFB  ASSESSMENT AND PLAN:   CVA (cerebral vascular accident) (HCC) RMCA CVA 08/30/18- palpitations pre event- 30 day Holter recommended.   Macular degeneration Followed at [facility]-not on ASA prior to admission  COPD mixed type (HCC) Stable, kyphosis on exam  Dyslipidemia Treated -LDL 52 08/31/18  Hypothyroidism TSH increased 08/31/18-Synthroid adjusted  Bradycardia With RBBB and LAFB- not on beta blcoker  Palpitations It was elected to  proceed with a 30 day monitor as opposed to a loop recorder   PLAN  I have arranged for her to have her monitor placed today. She'll f/u with Dr Pearlean Brownie as scheduled in Nov and Dr Ladona Ridgel after her monitor.   Corine Shelter PA-C 09/05/2018 3:33 PM

## 2018-09-05 NOTE — Patient Instructions (Signed)
Medication Instructions:   Continue same medications  If you need a refill on your cardiac medications before your next appointment, please call your pharmacy.   Lab work:  No labs ordered   Testing/Procedures:  Network engineer for 30 day monitor now at [employer]  Follow-Up: At [facility], you and your health needs are our priority.  As part of our continuing mission to provide you with exceptional heart care, we have created designated Provider Care Teams.  These Care Teams include your primary Cardiologist (physician) and Advanced Practice Providers (APPs -  Physician Assistants and Nurse Practitioners) who all work together to provide you with the care you need, when you need it.  Dr.Taylor Thursday 10/19/18 at 2:00 pm at [facility] office

## 2018-09-05 NOTE — Assessment & Plan Note (Signed)
Followed at [facility]-not on ASA prior to admission

## 2018-09-05 NOTE — Assessment & Plan Note (Signed)
Treated -LDL 52 08/31/18

## 2018-09-05 NOTE — Assessment & Plan Note (Signed)
With RBBB and LAFB- not on beta blcoker

## 2018-09-05 NOTE — Assessment & Plan Note (Signed)
RMCA CVA 08/30/18- palpitations pre event- 30 day Holter recommended.

## 2018-09-05 NOTE — Telephone Encounter (Signed)
Verbal orders given to Stacey. 

## 2018-09-05 NOTE — Assessment & Plan Note (Signed)
It was elected to proceed with a 30 day monitor as opposed to a loop recorder

## 2018-09-05 NOTE — Assessment & Plan Note (Signed)
Stable, kyphosis on exam

## 2018-09-05 NOTE — Telephone Encounter (Signed)
-----   Message from Debbora Dus sent at 09/04/2018  9:47 AM EDT ----- Marykay Lex, Can I get an order put in for the monitor??  Thanks, Melissa ----- Message ----- From: Bonnielee Haff, MD Sent: 09/01/2018   6:34 PM EDT To: Garvin Fila, MD, Candis Schatz, #  Hello, Ms. Romano was supposed to get a loop recorder but she decided not to get one and opting for a 30 day monitor. Can you please arrange this for her. Diagnosis: embolic stroke.  Thanks  [employer]

## 2018-09-05 NOTE — Assessment & Plan Note (Signed)
TSH increased 08/31/18-Synthroid adjusted

## 2018-09-05 NOTE — Telephone Encounter (Signed)
Dr. Lovena Le consulted during recent hospitalization.  Order placed for event monitor.

## 2018-09-06 ENCOUNTER — Other Ambulatory Visit: Payer: Self-pay

## 2018-09-06 NOTE — Patient Outreach (Signed)
[place] [facility]) Care Management  09/06/2018  Holly [name] 09/03/1928 [id number]  EMMI: stroke red alert Referral date: 09/06/18 Referral reason: scheduled a follow up appointment Insurance: Medicare Day # 1  Telephone call to patient regarding EMMI stroke red alert. HIPAA verified with patient. Explained reason for call.  Patient gave permission to speak with her daughter, Holly [name] regarding her health information. Daughter states patient is doing great.  She states patient had a doctors appointment on yesterday and has a follow up appointment with her primary MD on 09/07/18.  Daughter states patient has another follow up appointment scheduled with her primary MD on 09/18/18.  Daughter states patient has transportation to her appointments.  She states patient has her medications and takes them as prescribed. Daughter states [facility] is providing physical  therapy services for patient.  Daughter denies patient having any additional needs / concerns at this time.  RNCM discussed signs/ symptoms of stroke. Advised to call 911 for stroke like symptoms. RNCM provided patient with contact phone number for 24 hour nurse advise line.  RNCM advised patient to notify MD of any changes in condition prior to scheduled appointment.  PLAN: RNCM will close patient due to patient being assessed and having no further needs.  RNCM will send closure notification to patients primary MD.   Quinn Plowman RN,BSN,CCM [facility] Telephonic  [phone]

## 2018-09-07 ENCOUNTER — Encounter: Payer: Self-pay | Admitting: Family Medicine

## 2018-09-07 ENCOUNTER — Ambulatory Visit (INDEPENDENT_AMBULATORY_CARE_PROVIDER_SITE_OTHER): Payer: Medicare Other | Admitting: Family Medicine

## 2018-09-07 VITALS — BP 128/72 | HR 60 | Temp 97.6°F | Ht 62.0 in | Wt 106.0 lb

## 2018-09-07 DIAGNOSIS — Z23 Encounter for immunization: Secondary | ICD-10-CM

## 2018-09-07 DIAGNOSIS — R05 Cough: Secondary | ICD-10-CM

## 2018-09-07 DIAGNOSIS — R059 Cough, unspecified: Secondary | ICD-10-CM

## 2018-09-07 DIAGNOSIS — E038 Other specified hypothyroidism: Secondary | ICD-10-CM | POA: Diagnosis not present

## 2018-09-07 DIAGNOSIS — D692 Other nonthrombocytopenic purpura: Secondary | ICD-10-CM | POA: Diagnosis not present

## 2018-09-07 DIAGNOSIS — J3489 Other specified disorders of nose and nasal sinuses: Secondary | ICD-10-CM | POA: Diagnosis not present

## 2018-09-07 DIAGNOSIS — E785 Hyperlipidemia, unspecified: Secondary | ICD-10-CM | POA: Diagnosis not present

## 2018-09-07 DIAGNOSIS — I6359 Cerebral infarction due to unspecified occlusion or stenosis of other cerebral artery: Secondary | ICD-10-CM | POA: Diagnosis not present

## 2018-09-07 DIAGNOSIS — J449 Chronic obstructive pulmonary disease, unspecified: Secondary | ICD-10-CM

## 2018-09-07 MED ORDER — GUAIFENESIN-CODEINE 100-10 MG/5ML PO SOLN: 5.0000 mL | Freq: Three times a day (TID) | ORAL | 0 refills | Status: DC | PRN

## 2018-09-07 MED ORDER — IPRATROPIUM BROMIDE 0.06 % NA SOLN: 2.0000 | Freq: Four times a day (QID) | NASAL | 0 refills | Status: DC

## 2018-09-07 NOTE — Assessment & Plan Note (Signed)
No obvious deficients today. She will continue dual antiplatelets for 21 days, then aspirin alone.  Continue statin.  Follow-up with neurology planned.

## 2018-09-07 NOTE — Patient Instructions (Signed)
It was very nice to see you today!  Please stop your plavix after 21 days.  Please start the atrovent nasal spray. Take the cough syrup as needed.  Come back in 4-5 weeks to recheck your thyroid level. Please schedule a lab visit.  I will see you back in 1 year, or sooner as needed.  You can cancel your appointment on the 21st.  Take care, Dr Jerline Pain

## 2018-09-07 NOTE — Progress Notes (Signed)
Subjective:  Holly [name] is a 84 y.o. female who presents today with a chief complaint of hospital follow up for stroke and to transfer care  HPI:  Stroke, new problem Patient presented to the emergency department on 08/30/2018 with an episode of facial drooling the day prior.  She underwent stroke work-up in the emergency department which was consistent with embolic stroke.  Patient's symptoms had resolved at the time of admission.  While she was hospitalized, she underwent further work-up including transthoracic and transesophageal echocardiogram, both of which were negative.  She was not found to have any abnormal rhythms on her telemetry strip either.  Patient was discharged home in stable condition to follow-up with cardiology and neurology.  Hypothyroidism, chronic problem Patient had Synthroid dose increased to 88 mcg daily during her hospitalization due to elevated TSH in the setting of bradycardia.  She has started this within the past week in has done well.  Cough/rhinorrhea, acute problem Started about a week ago.  Worsening over that time.  No fevers or chills.  No specific treatments tried.  Cough is keeping her awake at night.  Bruising, new problem Patient dropped a pot lid on her left ankle a few days ago.  Noticed bruising to the area.  No pain.  Able to bear weight normally.  COPD, chronic problem, stable Several history.  She is currently on Wixela and albuterol.  Symptoms seem to be well controlled.  ROS: Per HPI  PMH: She reports that she quit smoking about 32 years ago. She has never used smokeless tobacco. She reports that she does not drink alcohol or use drugs.  Objective:  Physical Exam: BP 128/72 (BP Location: Left Arm, Patient Position: Sitting, Cuff Size: Normal)   Pulse 60   Temp 97.6 F (36.4 C) (Oral)   Ht 5\' 2"  (1.575 m)   Wt 106 lb (48.1 kg)   SpO2 97%   BMI 19.39 kg/m   Gen: NAD, resting comfortably HEENT: TMs clear.  Oropharynx slightly  erythematous without exudate.  Nasal mucosa erythematous and boggy bilaterally with clear nasal discharge. CV: RRR with no murmurs appreciated Pulm: NWOB, CTAB with no crackles, wheezes, or rhonchi GI: Normal bowel sounds present. Soft, Nontender, Nondistended. MSK: No edema, cyanosis, or clubbing noted.  Normal gait.  Ankle strength normal bilaterally. Skin: Warm, dry.  Scattered ecchymosis noted on upper and lower extremities.  Large area of ecchymosis on left medial ankle nontender to palpation. Neuro: Grossly normal, moves all extremities Psych: Normal affect and thought content  Assessment/Plan:  CVA (cerebral vascular accident) ([place]) No obvious deficients today. She will continue dual antiplatelets for 21 days, then aspirin alone.  Continue statin.  Follow-up with neurology planned.  Dyslipidemia Continue atorvastatin 40 mg daily.  Hypothyroidism Continue Synthroid 88 mcg daily.  Follow-up in 4 to 5 weeks to repeat TSH.  Senile purpura ([place]) Secondary to antiplatelets.  No red flag signs or symptoms.  Recommend use of warm compresses as needed.  COPD mixed type ([place]) Stable. Continue ICS/LABA and albuterol as needed.  Cough Likely secondary to viral URI. No signs of bacterial infection. Start atrovent for rhinorrhea/sinus congestion.  Start guaifenesin-codeine for cough. Recommended tylenol as needed for low grade fever and pain. Encouraged good oral hydration. Return precautions reviewed. Follow up as needed.   Preventive health care Flu shot given today.  Time Spent: I spent >40 minutes face-to-face with the patient, with more than half spent on counseling for management plan for her cough, hypothyroidism,  stroke, dyslipidemia, COPD, and senile purpura.   [name]. Jerline Pain, MD 09/07/2018 3:10 PM

## 2018-09-07 NOTE — Assessment & Plan Note (Signed)
Secondary to antiplatelets.  No red flag signs or symptoms.  Recommend use of warm compresses as needed.

## 2018-09-07 NOTE — Assessment & Plan Note (Signed)
Stable. Continue ICS/LABA and albuterol as needed.

## 2018-09-07 NOTE — Assessment & Plan Note (Signed)
Continue Synthroid 88 mcg daily.  Follow-up in 4 to 5 weeks to repeat TSH.

## 2018-09-07 NOTE — Assessment & Plan Note (Signed)
Continue atorvastatin 40 mg daily.  

## 2018-09-08 DIAGNOSIS — I2510 Atherosclerotic heart disease of native coronary artery without angina pectoris: Secondary | ICD-10-CM | POA: Diagnosis not present

## 2018-09-08 DIAGNOSIS — I671 Cerebral aneurysm, nonruptured: Secondary | ICD-10-CM | POA: Diagnosis not present

## 2018-09-08 DIAGNOSIS — I110 Hypertensive heart disease with heart failure: Secondary | ICD-10-CM | POA: Diagnosis not present

## 2018-09-08 DIAGNOSIS — I480 Paroxysmal atrial fibrillation: Secondary | ICD-10-CM | POA: Diagnosis not present

## 2018-09-08 DIAGNOSIS — I509 Heart failure, unspecified: Secondary | ICD-10-CM | POA: Diagnosis not present

## 2018-09-08 DIAGNOSIS — I69351 Hemiplegia and hemiparesis following cerebral infarction affecting right dominant side: Secondary | ICD-10-CM | POA: Diagnosis not present

## 2018-09-14 ENCOUNTER — Ambulatory Visit: Payer: Medicare Other

## 2018-09-14 DIAGNOSIS — I69351 Hemiplegia and hemiparesis following cerebral infarction affecting right dominant side: Secondary | ICD-10-CM | POA: Diagnosis not present

## 2018-09-14 DIAGNOSIS — I509 Heart failure, unspecified: Secondary | ICD-10-CM | POA: Diagnosis not present

## 2018-09-14 DIAGNOSIS — I2510 Atherosclerotic heart disease of native coronary artery without angina pectoris: Secondary | ICD-10-CM | POA: Diagnosis not present

## 2018-09-14 DIAGNOSIS — I671 Cerebral aneurysm, nonruptured: Secondary | ICD-10-CM | POA: Diagnosis not present

## 2018-09-14 DIAGNOSIS — I480 Paroxysmal atrial fibrillation: Secondary | ICD-10-CM | POA: Diagnosis not present

## 2018-09-14 DIAGNOSIS — I110 Hypertensive heart disease with heart failure: Secondary | ICD-10-CM | POA: Diagnosis not present

## 2018-09-15 DIAGNOSIS — I671 Cerebral aneurysm, nonruptured: Secondary | ICD-10-CM | POA: Diagnosis not present

## 2018-09-15 DIAGNOSIS — I69351 Hemiplegia and hemiparesis following cerebral infarction affecting right dominant side: Secondary | ICD-10-CM | POA: Diagnosis not present

## 2018-09-15 DIAGNOSIS — I509 Heart failure, unspecified: Secondary | ICD-10-CM | POA: Diagnosis not present

## 2018-09-15 DIAGNOSIS — I110 Hypertensive heart disease with heart failure: Secondary | ICD-10-CM | POA: Diagnosis not present

## 2018-09-15 DIAGNOSIS — I480 Paroxysmal atrial fibrillation: Secondary | ICD-10-CM | POA: Diagnosis not present

## 2018-09-15 DIAGNOSIS — I2510 Atherosclerotic heart disease of native coronary artery without angina pectoris: Secondary | ICD-10-CM | POA: Diagnosis not present

## 2018-09-18 ENCOUNTER — Telehealth: Payer: Self-pay | Admitting: Family Medicine

## 2018-09-18 ENCOUNTER — Ambulatory Visit: Payer: Medicare Other | Admitting: Family Medicine

## 2018-09-18 NOTE — Telephone Encounter (Signed)
Called and left a voicemail explaining that we cancelled the appointment today on their behalf as it was on the after visit summary however was missed at check out on 09/07/18.  Copied from [place] [phone]. Topic: Appointment Scheduling - Scheduling Inquiry for Clinic >> Sep 18, 2018 11:55 AM Conception Chancy, NT wrote: Reason for CRM: patient daughter Daine Floras is calling and states that when the patient had her hospital follow up on 09/07/18 Dr. Jerline Pain told them that she did not need to keep the appointment today as a new patient since it would all be done in that appointment. She was under the understanding that the appointment today was going to be canceled. She is wanting to make sure she does not get a no charge fee. Please advise.

## 2018-09-21 DIAGNOSIS — I509 Heart failure, unspecified: Secondary | ICD-10-CM | POA: Diagnosis not present

## 2018-09-21 DIAGNOSIS — I110 Hypertensive heart disease with heart failure: Secondary | ICD-10-CM | POA: Diagnosis not present

## 2018-09-21 DIAGNOSIS — I671 Cerebral aneurysm, nonruptured: Secondary | ICD-10-CM | POA: Diagnosis not present

## 2018-09-21 DIAGNOSIS — I69351 Hemiplegia and hemiparesis following cerebral infarction affecting right dominant side: Secondary | ICD-10-CM | POA: Diagnosis not present

## 2018-09-21 DIAGNOSIS — I480 Paroxysmal atrial fibrillation: Secondary | ICD-10-CM | POA: Diagnosis not present

## 2018-09-21 DIAGNOSIS — I2510 Atherosclerotic heart disease of native coronary artery without angina pectoris: Secondary | ICD-10-CM | POA: Diagnosis not present

## 2018-09-28 DIAGNOSIS — Z87891 Personal history of nicotine dependence: Secondary | ICD-10-CM | POA: Diagnosis not present

## 2018-09-28 DIAGNOSIS — Z7951 Long term (current) use of inhaled steroids: Secondary | ICD-10-CM

## 2018-09-28 DIAGNOSIS — Z7982 Long term (current) use of aspirin: Secondary | ICD-10-CM

## 2018-09-28 DIAGNOSIS — R9082 White matter disease, unspecified: Secondary | ICD-10-CM

## 2018-09-28 DIAGNOSIS — I110 Hypertensive heart disease with heart failure: Secondary | ICD-10-CM | POA: Diagnosis not present

## 2018-09-28 DIAGNOSIS — I671 Cerebral aneurysm, nonruptured: Secondary | ICD-10-CM | POA: Diagnosis not present

## 2018-09-28 DIAGNOSIS — H3530 Unspecified macular degeneration: Secondary | ICD-10-CM | POA: Diagnosis not present

## 2018-09-28 DIAGNOSIS — J449 Chronic obstructive pulmonary disease, unspecified: Secondary | ICD-10-CM

## 2018-09-28 DIAGNOSIS — E785 Hyperlipidemia, unspecified: Secondary | ICD-10-CM | POA: Diagnosis not present

## 2018-09-28 DIAGNOSIS — Z79899 Other long term (current) drug therapy: Secondary | ICD-10-CM

## 2018-09-28 DIAGNOSIS — I2510 Atherosclerotic heart disease of native coronary artery without angina pectoris: Secondary | ICD-10-CM | POA: Diagnosis not present

## 2018-09-28 DIAGNOSIS — I509 Heart failure, unspecified: Secondary | ICD-10-CM | POA: Diagnosis not present

## 2018-09-28 DIAGNOSIS — I69351 Hemiplegia and hemiparesis following cerebral infarction affecting right dominant side: Secondary | ICD-10-CM | POA: Diagnosis not present

## 2018-09-28 DIAGNOSIS — E039 Hypothyroidism, unspecified: Secondary | ICD-10-CM | POA: Diagnosis not present

## 2018-09-28 DIAGNOSIS — I480 Paroxysmal atrial fibrillation: Secondary | ICD-10-CM | POA: Diagnosis not present

## 2018-09-29 ENCOUNTER — Other Ambulatory Visit: Payer: Self-pay | Admitting: Family Medicine

## 2018-10-02 ENCOUNTER — Telehealth: Payer: Self-pay | Admitting: Radiology

## 2018-10-02 ENCOUNTER — Other Ambulatory Visit: Payer: Self-pay

## 2018-10-02 DIAGNOSIS — E038 Other specified hypothyroidism: Secondary | ICD-10-CM

## 2018-10-02 NOTE — Telephone Encounter (Signed)
Orders have been placed.

## 2018-10-02 NOTE — Progress Notes (Signed)
ts

## 2018-10-02 NOTE — Telephone Encounter (Signed)
Please place future lab orders for Pts lab appt 10/05/18. Thanks!

## 2018-10-02 NOTE — Telephone Encounter (Signed)
Any other labs needed aside from TSH?

## 2018-10-02 NOTE — Telephone Encounter (Signed)
TSH and free t4 please.  [name]. Jerline Pain, MD 10/02/2018 9:29 AM

## 2018-10-03 ENCOUNTER — Ambulatory Visit (INDEPENDENT_AMBULATORY_CARE_PROVIDER_SITE_OTHER): Payer: Medicare Other | Admitting: Neurology

## 2018-10-03 ENCOUNTER — Encounter: Payer: Self-pay | Admitting: Neurology

## 2018-10-03 VITALS — BP 117/69 | HR 68 | Ht 61.0 in | Wt 111.6 lb

## 2018-10-03 DIAGNOSIS — I639 Cerebral infarction, unspecified: Secondary | ICD-10-CM | POA: Diagnosis not present

## 2018-10-03 NOTE — Progress Notes (Signed)
[facility] [address] [place]. [place] [address] [phone]       OFFICE FOLLOW-UP NOTE  [name] Date of Birth:  1928-06-02 Medical Record Number:  [id number]   HPI: Holly [name] is a 84 year old pleasant Caucasian lady seen today for initial office follow-up visit following hospital admission for stroke.  She is accompanied by her daughter Holly [name].  History is obtained from them and review of electronic medical records.  I personally reviewed imaging films.HPI ( Dr Laurence Slate ) ;Holly [name] is an 84 y.o. female  with PMH of CHF, CAD, hypthyorodism, HLD presents with transient episode of slurred speech and gait instability that occurred around 6 PM yesterday evening witnessed by her son.  Patient's son insisted she come to the hospital and evaluated today in the ER.  MRI brain shows small acute infarcts in the right parietal lobe and insula.  She was admitted for stroke work-up and neurology was consulted.  Patient not on ASA due to advice from her ophthalmologist for macular degeneration?  She also states that she had some palpitations/quivering in her neck prior to this episode. Date last known well: 10.1.19 Time last known well: 6pm tPA Given: no, outside window NIHSS: 0 Baseline MRS 0  .  CT scan of the head showed diffuse atrophy and white matter changes no acute abnormality.  MRI scan of the brain showed 2 tiny right insular cortex and right parietal embolic infarcts.  MRI of the brain showed no large vessel stenosis but small 1 to 2 mm left anterior communicating artery aneurysm.  LDL cholesterol was 52 mg percent.  Hemoglobin A1c was 5.2.  Carotid ultrasound showed no extracranial stenosis.  Transthoracic echo showed normal ejection fraction and transesophageal echo showed no definite cardiac source of embolism.  Cardiology was consulted for loop recorder placement but given patient's history of palpitations the night prior to the stroke decided to do a 30-day external  heart monitor instead.  Patient is still wearing a heart monitor and so far proximal A. fib has not yet been found.  She is states she is done well since discharge.  Her speech and balance and walking have improved.  She is on aspirin 81 mg daily which is tolerating well with only minor bruising.  She is still quite independent and does all activities of daily living by herself.  She has no new complaints today.  Her blood pressure is apparently well controlled today it is 117/68.  ROS:   14 system review of systems is positive for mild headache, and weakness and all other systems negative PMH:  Past Medical History:  Diagnosis Date  . CAD (coronary artery disease)   . CELLULITIS 06/18/2008  . CHF (congestive heart failure) (HCC)   . CHRONIC OBSTRUCTIVE PULMONARY DISEASE, ACUTE EXACERBATION 11/16/2007  . COPD 05/18/2007  . GERD 12/25/2007  . HYPERLIPIDEMIA 05/18/2007  . HYPOTHYROIDISM 05/18/2007  . Macular degeneration   . Stroke (HCC)   . Urosepsis     Social History:  Social History   Socioeconomic History  . Marital status: Married    Spouse name: Not on file  . Number of children: Not on file  . Years of education: Not on file  . Highest education level: Not on file  Occupational History  . Not on file  Social Needs  . Financial resource strain: Not on file  . Food insecurity:    Worry: Not on file    Inability: Not on file  .  Transportation needs:    Medical: Not on file    Non-medical: Not on file  Tobacco Use  . Smoking status: Former Smoker    Last attempt to quit: 11/29/1985    Years since quitting: 32.8  . Smokeless tobacco: Never Used  Substance and Sexual Activity  . Alcohol use: No  . Drug use: No  . Sexual activity: Not on file  Lifestyle  . Physical activity:    Days per week: Not on file    Minutes per session: Not on file  . Stress: Not on file  Relationships  . Social connections:    Talks on phone: Not on file    Gets together: Not on file     Attends religious service: Not on file    Active member of club or organization: Not on file    Attends meetings of clubs or organizations: Not on file    Relationship status: Not on file  . Intimate partner violence:    Fear of current or ex partner: Not on file    Emotionally abused: Not on file    Physically abused: Not on file    Forced sexual activity: Not on file  Other Topics Concern  . Not on file  Social History Narrative  . Not on file    Medications:   Current Outpatient Medications on File Prior to Visit  Medication Sig Dispense Refill  . albuterol (PROVENTIL HFA) 108 (90 Base) MCG/ACT inhaler Inhale 2 puffs into the lungs every 6 (six) hours as needed. (Patient taking differently: Inhale 1-2 puffs into the lungs every 6 (six) hours as needed for wheezing or shortness of breath. ) 20.1 each 5  . aspirin EC 81 MG EC tablet Take 1 tablet (81 mg total) by mouth daily. 30 tablet 3  . atorvastatin (LIPITOR) 40 MG tablet Take 1 tablet (40 mg total) by mouth daily at 6 PM. 30 tablet 0  . Fluticasone-Salmeterol (WIXELA INHUB) 500-50 MCG/DOSE AEPB Inhale 1 puff into the lungs 2 (two) times daily.    Marland Kitchen ipratropium (ATROVENT) 0.06 % nasal spray PLACE 2 SPRAYS INTO BOTH NOSTRILS 4 TIMES DAILY. 15 mL 0  . levothyroxine (SYNTHROID, LEVOTHROID) 88 MCG tablet Take 1 tablet (88 mcg total) by mouth daily. 30 tablet 0  . Multiple Vitamins-Minerals (ICAPS PO) Take 1 capsule by mouth 2 (two) times daily.     No current facility-administered medications on file prior to visit.     Allergies:   Allergies  Allergen Reactions  . Levofloxacin     REACTION: unspecified  . Macrobid [Nitrofurantoin Macrocrystal] Nausea Only  . Sulfamethoxazole     REACTION: unspecified  . Tramadol Other (See Comments)    Shakes   . Amoxicillin Rash    Has patient had a PCN reaction causing immediate rash, facial/tongue/throat swelling, SOB or lightheadedness with hypotension: No Has patient had a PCN  reaction causing severe rash involving mucus membranes or skin necrosis: No Has patient had a PCN reaction that required hospitalization: No Has patient had a PCN reaction occurring within the last 10 years: No If all of the above answers are "NO", then may proceed with Cephalosporin use.   Made "bottom area" very raw    Physical Exam General: Frail elderly Caucasian lady seated, in no evident distress Head: head normocephalic and atraumatic.  Neck: supple with no carotid or supraclavicular bruits Cardiovascular: regular rate and rhythm, no murmurs Musculoskeletal: no deformity Skin:  no rash/petichiae Vascular:  Normal pulses all extremities  Vitals:   10/03/18 1433  BP: 117/69  Pulse: 68   Neurologic Exam Mental Status: Awake and fully alert. Oriented to place and time. Recent and remote memory intact. Attention span, concentration and fund of knowledge appropriate. Mood and affect appropriate.  Cranial Nerves: Fundoscopic exam reveals sharp disc margins. Pupils equal, briskly reactive to light. Extraocular movements full without nystagmus. Visual fields full to confrontation. Hearing intact. Facial sensation intact.  Mild left nasolabial fold asymmetry., tongue, palate moves normally and symmetrically.  Motor: Normal bulk and tone. Normal strength in all tested extremity muscles.  Diminished fine finger movements on the left.  Orbits right over left upper extremity.  Mild left grip weakness. Sensory.: intact to touch ,pinprick .position and vibratory sensation.  Coordination: Rapid alternating movements normal in all extremities. Finger-to-nose and heel-to-shin performed accurately bilaterally. Gait and Station: Arises from chair without difficulty. Stance is normal. Gait demonstrates normal stride length and balance . Able to heel, toe and tandem walk with mild difficulty.  Reflexes: 1+ and symmetric. Toes downgoing.   NIHSS  1 Modified [place]in  2   ASSESSMENT: 84 year old  Caucasian lady with right MCA embolic infarction October 2019 of cryptogenic etiology.  Vascular risk factors of age and hyperlipidemia only.     PLAN: I had a long d/w patient and her daughter susieabout his recent stroke, risk for recurrent stroke/TIAs, personally independently reviewed imaging studies and stroke evaluation results and answered questions.Continue aspirin 81 mg daily  for secondary stroke prevention and maintain strict control of hypertension with blood pressure goal below 130/90, diabetes with hemoglobin A1c goal below 6.5% and lipids with LDL cholesterol goal below 70 mg/dL. I also advised the patient to eat a healthy diet with plenty of whole grains, cereals, fruits and vegetables, exercise regularly and maintain ideal body weight . Consider particpation in [place] stroke trial if interested.Followup in the future with my  Nurse practitioner Shanda Bumps in 6 months or call earlier if necessary Greater than 50% of time during this 25 minute visit was spent on counseling,explanation of diagnosis, planning of further management, discussion with patient and family and coordination of care Delia Heady, MD  [facility] [address] [place], [place] [address]  Phone [phone] Fax [phone] Note: This document was prepared with digital dictation and possible smart phrase technology. Any transcriptional errors that result from this process are unintentional

## 2018-10-03 NOTE — Patient Instructions (Signed)
I had a long d/w patient and her daughter susieabout his recent stroke, risk for recurrent stroke/TIAs, personally independently reviewed imaging studies and stroke evaluation results and answered questions.Continue aspirin 81 mg daily  for secondary stroke prevention and maintain strict control of hypertension with blood pressure goal below 130/90, diabetes with hemoglobin A1c goal below 6.5% and lipids with LDL cholesterol goal below 70 mg/dL. I also advised the patient to eat a healthy diet with plenty of whole grains, cereals, fruits and vegetables, exercise regularly and maintain ideal body weight . Consider particpation in [place] stroke trial if interested.Followup in the future with my  Nurse practitioner Janett Billow in 6 months or call earlier if necessary  Stroke Prevention Some medical conditions and behaviors are associated with a higher chance of having a stroke. You can help prevent a stroke by making nutrition, lifestyle, and other changes, including managing any medical conditions you may have. What nutrition changes can be made?  Eat healthy foods. You can do this by: ? Choosing foods high in fiber, such as fresh fruits and vegetables and whole grains. ? Eating at least 5 or more servings of fruits and vegetables a day. Try to fill half of your plate at each meal with fruits and vegetables. ? Choosing lean protein foods, such as lean cuts of meat, poultry without skin, fish, tofu, beans, and nuts. ? Eating low-fat dairy products. ? Avoiding foods that are high in salt (sodium). This can help lower blood pressure. ? Avoiding foods that have saturated fat, trans fat, and cholesterol. This can help prevent high cholesterol. ? Avoiding processed and premade foods.  Follow your health care provider's specific guidelines for losing weight, controlling high blood pressure (hypertension), lowering high cholesterol, and managing diabetes. These may include: ? Reducing your daily calorie  intake. ? Limiting your daily sodium intake to 1,500 milligrams (mg). ? Using only healthy fats for cooking, such as olive oil, canola oil, or sunflower oil. ? Counting your daily carbohydrate intake. What lifestyle changes can be made?  Maintain a healthy weight. Talk to your health care provider about your ideal weight.  Get at least 30 minutes of moderate physical activity at least 5 days a week. Moderate activity includes brisk walking, biking, and swimming.  Do not use any products that contain nicotine or tobacco, such as cigarettes and e-cigarettes. If you need help quitting, ask your health care provider. It may also be helpful to avoid exposure to secondhand smoke.  Limit alcohol intake to no more than 1 drink a day for nonpregnant women and 2 drinks a day for men. One drink equals 12 oz of beer, 5 oz of wine, or 1 oz of hard liquor.  Stop any illegal drug use.  Avoid taking birth control pills. Talk to your health care provider about the risks of taking birth control pills if: ? You are over 34 years old. ? You smoke. ? You get migraines. ? You have ever had a blood clot. What other changes can be made?  Manage your cholesterol levels. ? Eating a healthy diet is important for preventing high cholesterol. If cholesterol cannot be managed through diet alone, you may also need to take medicines. ? Take any prescribed medicines to control your cholesterol as told by your health care provider.  Manage your diabetes. ? Eating a healthy diet and exercising regularly are important parts of managing your blood sugar. If your blood sugar cannot be managed through diet and exercise, you may need to take  medicines. ? Take any prescribed medicines to control your diabetes as told by your health care provider.  Control your hypertension. ? To reduce your risk of stroke, try to keep your blood pressure below 130/80. ? Eating a healthy diet and exercising regularly are an important part  of controlling your blood pressure. If your blood pressure cannot be managed through diet and exercise, you may need to take medicines. ? Take any prescribed medicines to control hypertension as told by your health care provider. ? Ask your health care provider if you should monitor your blood pressure at home. ? Have your blood pressure checked every year, even if your blood pressure is normal. Blood pressure increases with age and some medical conditions.  Get evaluated for sleep disorders (sleep apnea). Talk to your health care provider about getting a sleep evaluation if you snore a lot or have excessive sleepiness.  Take over-the-counter and prescription medicines only as told by your health care provider. Aspirin or blood thinners (antiplatelets or anticoagulants) may be recommended to reduce your risk of forming blood clots that can lead to stroke.  Make sure that any other medical conditions you have, such as atrial fibrillation or atherosclerosis, are managed. What are the warning signs of a stroke? The warning signs of a stroke can be easily remembered as BEFAST.  B is for balance. Signs include: ? Dizziness. ? Loss of balance or coordination. ? Sudden trouble walking.  E is for eyes. Signs include: ? A sudden change in vision. ? Trouble seeing.  F is for face. Signs include: ? Sudden weakness or numbness of the face. ? The face or eyelid drooping to one side.  A is for arms. Signs include: ? Sudden weakness or numbness of the arm, usually on one side of the body.  S is for speech. Signs include: ? Trouble speaking (aphasia). ? Trouble understanding.  T is for time. ? These symptoms may represent a serious problem that is an emergency. Do not wait to see if the symptoms will go away. Get medical help right away. Call your local emergency services (911 in the [place] Do not drive yourself to the hospital.  Other signs of stroke may include: ? A sudden, severe headache  with no known cause. ? Nausea or vomiting. ? Seizure.  Where to find more information: For more information, visit:  American Stroke Association: www.strokeassociation.org  National Stroke Association: www.stroke.org  Summary  You can prevent a stroke by eating healthy, exercising, not smoking, limiting alcohol intake, and managing any medical conditions you may have.  Do not use any products that contain nicotine or tobacco, such as cigarettes and e-cigarettes. If you need help quitting, ask your health care provider. It may also be helpful to avoid exposure to secondhand smoke.  Remember BEFAST for warning signs of stroke. Get help right away if you or a loved one has any of these signs. This information is not intended to replace advice given to you by your health care provider. Make sure you discuss any questions you have with your health care provider. Document Released: 12/23/2004 Document Revised: 12/21/2016 Document Reviewed: 12/21/2016 Elsevier Interactive Patient Education  [employer].

## 2018-10-04 ENCOUNTER — Other Ambulatory Visit: Payer: Self-pay | Admitting: Family Medicine

## 2018-10-04 MED ORDER — LEVOTHYROXINE SODIUM 88 MCG PO TABS: 88.0000 ug | ORAL_TABLET | Freq: Every day | ORAL | 0 refills | Status: DC

## 2018-10-04 NOTE — Telephone Encounter (Signed)
Requested medication (s) are due for refill today -yes  Requested medication (s) are on the active medication list -yes  Future visit scheduled lab scheduled today  Last refill: 09/01/18  Notes to clinic: patient was put on 88 mcg dose in hospital- was to repeat labs 4-5 weeks- lab today- sent for refill of Rx written by outside provider.  Requested Prescriptions  Pending Prescriptions Disp Refills   levothyroxine (SYNTHROID, LEVOTHROID) 88 MCG tablet 30 tablet 0    Sig: Take 1 tablet (88 mcg total) by mouth daily.     Endocrinology:  Hypothyroid Agents Failed - 10/04/2018  8:38 AM      Failed - TSH needs to be rechecked within 3 months after an abnormal result. Refill until TSH is due.      Failed - TSH in normal range and within 360 days    TSH  Date Value Ref Range Status  09/01/2018 11.910 (H) 0.350 - 4.500 uIU/mL Final    Comment:    Performed by a 3rd Generation assay with a functional sensitivity of <=0.01 uIU/mL. Performed at [facility], [place] [address] [place], [place] [address]   05/10/2017 7.80 (H) 0.35 - 4.50 uIU/mL Final         Passed - Valid encounter within last 12 months    Recent Outpatient Visits          3 weeks ago Cerebrovascular accident (CVA) due to occlusion of other cerebral artery ([place])   [place] [facility], Walker, MD   4 months ago Atherosclerosis of native coronary artery of native heart without angina pectoris   [place] [facility], Doretha Sou, MD   4 months ago Cough   [facility], [name], MD   4 months ago [place] [facility], Doretha Sou, MD   5 months ago [place] [facility], Doretha Sou, MD      Future Appointments            In 2 weeks Evans Lance, MD [facility],  [facility]            Requested Prescriptions  Pending Prescriptions Disp Refills   levothyroxine (SYNTHROID, LEVOTHROID) 88 MCG tablet 30 tablet 0    Sig: Take 1 tablet (88 mcg total) by mouth daily.     Endocrinology:  Hypothyroid Agents Failed - 10/04/2018  8:38 AM      Failed - TSH needs to be rechecked within 3 months after an abnormal result. Refill until TSH is due.      Failed - TSH in normal range and within 360 days    TSH  Date Value Ref Range Status  09/01/2018 11.910 (H) 0.350 - 4.500 uIU/mL Final    Comment:    Performed by a 3rd Generation assay with a functional sensitivity of <=0.01 uIU/mL. Performed at [facility], [place] [address] [place], [place] [address]   05/10/2017 7.80 (H) 0.35 - 4.50 uIU/mL Final         Passed - Valid encounter within last 12 months    Recent Outpatient Visits          3 weeks ago Cerebrovascular accident (CVA) due to occlusion of other cerebral artery [facility])   [facility], [place], MD   4 months ago Atherosclerosis of native coronary  artery of native heart without angina pectoris   [place] [facility], Doretha Sou, MD   4 months ago Cough   [place] [facility], [name], MD   4 months ago [place], ACUTE EXACERBATION   [place] [facility], Doretha Sou, MD   5 months ago [place], ACUTE [facility], Doretha Sou, MD      Future Appointments            In 2 weeks Evans Lance, MD [facility], [facility]

## 2018-10-04 NOTE — Telephone Encounter (Signed)
Copied from [place] [id number]. Topic: Quick Communication - Rx Refill/Question >> Oct 04, 2018  8:29 AM Leward Quan A wrote: Medication: levothyroxine (SYNTHROID, LEVOTHROID) 88 MCG tablet   Patient is all out of this medication need refill today please  Has the patient contacted their pharmacy? Yes.     Preferred Pharmacy (with phone number or street name): [id number] [place], [place]. [phone] (Phone) [phone] (Fax)    Agent: Please be advised that RX refills may take up to 3 business days. We ask that you follow-up with your pharmacy.

## 2018-10-05 ENCOUNTER — Other Ambulatory Visit (INDEPENDENT_AMBULATORY_CARE_PROVIDER_SITE_OTHER): Payer: Medicare Other

## 2018-10-05 DIAGNOSIS — E038 Other specified hypothyroidism: Secondary | ICD-10-CM | POA: Diagnosis not present

## 2018-10-05 LAB — T4, FREE: Free T4: 0.67 ng/dL (ref 0.60–1.60)

## 2018-10-05 LAB — TSH: TSH: 10.89 u[IU]/mL — ABNORMAL HIGH (ref 0.35–4.50)

## 2018-10-09 NOTE — Progress Notes (Signed)
Please inform patient of the following:  Her thyroid level is still off. Would like to increase her synthroid to 181mcg daily and have her recheck in 4-6 weeks. Please send in new rx if needed and place future order for TSH.  [name]. Jerline Pain, MD 10/09/2018 8:17 AM

## 2018-10-11 ENCOUNTER — Other Ambulatory Visit: Payer: Self-pay

## 2018-10-11 DIAGNOSIS — R002 Palpitations: Secondary | ICD-10-CM | POA: Diagnosis not present

## 2018-10-11 DIAGNOSIS — E038 Other specified hypothyroidism: Secondary | ICD-10-CM

## 2018-10-11 MED ORDER — LEVOTHYROXINE SODIUM 100 MCG PO TABS: 100.0000 ug | ORAL_TABLET | Freq: Every day | ORAL | 0 refills | Status: DC

## 2018-10-16 ENCOUNTER — Telehealth: Payer: Self-pay

## 2018-10-16 NOTE — Telephone Encounter (Signed)
-----   Message from Evans Lance, MD sent at 10/14/2018  8:05 PM EST ----- No change

## 2018-10-16 NOTE — Telephone Encounter (Signed)
Call placed to Pt.  Advised of monitor results.  Pt with f/u appt 10/19/2018 with Dr. Lovena Le.  Pt advised.  No further questions at this time.

## 2018-10-19 ENCOUNTER — Ambulatory Visit (INDEPENDENT_AMBULATORY_CARE_PROVIDER_SITE_OTHER): Payer: Medicare Other | Admitting: Internal Medicine

## 2018-10-19 ENCOUNTER — Encounter: Payer: Self-pay | Admitting: Internal Medicine

## 2018-10-19 VITALS — BP 128/62 | HR 71 | Ht 60.0 in | Wt 108.0 lb

## 2018-10-19 DIAGNOSIS — R002 Palpitations: Secondary | ICD-10-CM

## 2018-10-19 DIAGNOSIS — R001 Bradycardia, unspecified: Secondary | ICD-10-CM | POA: Diagnosis not present

## 2018-10-19 DIAGNOSIS — I6340 Cerebral infarction due to embolism of unspecified cerebral artery: Secondary | ICD-10-CM | POA: Diagnosis not present

## 2018-10-19 DIAGNOSIS — I639 Cerebral infarction, unspecified: Secondary | ICD-10-CM

## 2018-10-19 NOTE — Patient Instructions (Addendum)
Medication Instructions:  Your physician recommends that you continue on your current medications as directed. Please refer to the Current Medication list given to you today.  Labwork: None ordered.  Testing/Procedures: None ordered.  Follow-Up: Your physician wants you to follow-up in: as needed with Dr. Taylor.      Any Other Special Instructions Will Be Listed Below (If Applicable).  If you need a refill on your cardiac medications before your next appointment, please call your pharmacy.   

## 2018-10-19 NOTE — Progress Notes (Signed)
HPI [name] returns today for followup of cryptogenic stroke. She wore a cardiac monitor with no atrial fib noted. She has been stable in the interim with no chest pain or sob. She has not had any neuro symptoms.  Allergies  Allergen Reactions  . Levofloxacin     REACTION: unspecified  . Macrobid [Nitrofurantoin Macrocrystal] Nausea Only  . Sulfamethoxazole     REACTION: unspecified  . Tramadol Other (See Comments)    Shakes   . Amoxicillin Rash    Has patient had a PCN reaction causing immediate rash, facial/tongue/throat swelling, SOB or lightheadedness with hypotension: No Has patient had a PCN reaction causing severe rash involving mucus membranes or skin necrosis: No Has patient had a PCN reaction that required hospitalization: No Has patient had a PCN reaction occurring within the last 10 years: No If all of the above answers are "NO", then may proceed with Cephalosporin use.   Made "bottom area" very raw     Current Outpatient Medications  Medication Sig Dispense Refill  . albuterol (PROVENTIL HFA) 108 (90 Base) MCG/ACT inhaler Inhale 2 puffs into the lungs every 6 (six) hours as needed. (Patient taking differently: Inhale 1-2 puffs into the lungs every 6 (six) hours as needed for wheezing or shortness of breath. ) 20.1 each 5  . aspirin EC 81 MG EC tablet Take 1 tablet (81 mg total) by mouth daily. 30 tablet 3  . atorvastatin (LIPITOR) 40 MG tablet Take 1 tablet (40 mg total) by mouth daily at 6 PM. 30 tablet 0  . Fluticasone-Salmeterol (WIXELA INHUB) 500-50 MCG/DOSE AEPB Inhale 1 puff into the lungs 2 (two) times daily.    Marland Kitchen ipratropium (ATROVENT) 0.06 % nasal spray PLACE 2 SPRAYS INTO BOTH NOSTRILS 4 TIMES DAILY. 15 mL 0  . levothyroxine (SYNTHROID, LEVOTHROID) 100 MCG tablet Take 1 tablet (100 mcg total) by mouth daily. 30 tablet 0  . Multiple Vitamins-Minerals (ICAPS PO) Take 1 capsule by mouth 2 (two) times daily.     No current facility-administered  medications for this visit.      Past Medical History:  Diagnosis Date  . CAD (coronary artery disease)   . CELLULITIS 06/18/2008  . CHF (congestive heart failure) ([place])   . CHRONIC OBSTRUCTIVE PULMONARY DISEASE, ACUTE EXACERBATION 11/16/2007  . COPD 05/18/2007  . GERD 12/25/2007  . HYPERLIPIDEMIA 05/18/2007  . HYPOTHYROIDISM 05/18/2007  . Macular degeneration   . Stroke ([employer])   . Urosepsis     ROS:   All systems reviewed and negative except as noted in the HPI.   Past Surgical History:  Procedure Laterality Date  . CATARACT EXTRACTION    . DILATION AND CURETTAGE OF UTERUS    . TEE WITHOUT CARDIOVERSION N/A 09/01/2018   Procedure: TRANSESOPHAGEAL ECHOCARDIOGRAM (TEE);  Surgeon: Buford Dresser, MD;  Location: [facility] ENDOSCOPY;  Service: Cardiovascular;  Laterality: N/A;  . VARICOSE VEIN SURGERY       Family History  Problem Relation Age of Onset  . Ovarian cancer Mother   . Diabetes Mellitus II Brother      Social History   Socioeconomic History  . Marital status: Married    Spouse name: Not on file  . Number of children: Not on file  . Years of education: Not on file  . Highest education level: Not on file  Occupational History  . Not on file  Social Needs  . Financial resource strain: Not on file  . Food insecurity:  Worry: Not on file    Inability: Not on file  . Transportation needs:    Medical: Not on file    Non-medical: Not on file  Tobacco Use  . Smoking status: Former Smoker    Last attempt to quit: 11/29/1985    Years since quitting: 32.9  . Smokeless tobacco: Never Used  Substance and Sexual Activity  . Alcohol use: No  . Drug use: No  . Sexual activity: Not on file  Lifestyle  . Physical activity:    Days per week: Not on file    Minutes per session: Not on file  . Stress: Not on file  Relationships  . Social connections:    Talks on phone: Not on file    Gets together: Not on file    Attends religious service: Not on file     Active member of club or organization: Not on file    Attends meetings of clubs or organizations: Not on file    Relationship status: Not on file  . Intimate partner violence:    Fear of current or ex partner: Not on file    Emotionally abused: Not on file    Physically abused: Not on file    Forced sexual activity: Not on file  Other Topics Concern  . Not on file  Social History Narrative  . Not on file     BP 128/62   Pulse 71   Ht 5' (1.524 m)   Wt 108 lb (49 kg)   SpO2 93%   BMI 21.09 kg/m   Physical Exam:  Well appearing NAD HEENT: Unremarkable Neck:  No JVD, no thyromegally Lymphatics:  No adenopathy Back:  No CVA tenderness Lungs:  Clear with no wheezes HEART:  Regular rate rhythm, no murmurs, no rubs, no clicks Abd:  soft, positive bowel sounds, no organomegally, no rebound, no guarding Ext:  2 plus pulses, no edema, no cyanosis, no clubbing Skin:  No rashes no nodules Neuro:  CN II through XII intact, motor grossly intact   Assess/Plan: 1. Cryptogenic stroke - I discussed the treatment options with the patient and recommended insertion of an ILR. She is likely to have had PAF. She is uninterested in proceeding with ILR at this time. She states she will call us if she changes her mind.  2. HTN - her blood pressure is reasonably well controlled.   3. Dyslipidemia - she will continue her statin therapy.  Mikle Bosworth.D.

## 2018-11-07 ENCOUNTER — Other Ambulatory Visit: Payer: Self-pay | Admitting: Family Medicine

## 2018-11-13 ENCOUNTER — Ambulatory Visit: Payer: Self-pay | Admitting: *Deleted

## 2018-11-13 ENCOUNTER — Encounter: Payer: Self-pay | Admitting: Family Medicine

## 2018-11-13 ENCOUNTER — Ambulatory Visit (INDEPENDENT_AMBULATORY_CARE_PROVIDER_SITE_OTHER): Payer: Medicare Other | Admitting: Family Medicine

## 2018-11-13 VITALS — BP 130/58 | HR 62 | Temp 97.6°F | Ht 60.0 in | Wt 111.0 lb

## 2018-11-13 DIAGNOSIS — R600 Localized edema: Secondary | ICD-10-CM

## 2018-11-13 DIAGNOSIS — E038 Other specified hypothyroidism: Secondary | ICD-10-CM

## 2018-11-13 NOTE — Assessment & Plan Note (Signed)
Likely venous insufficiency.  No signs of volume overload.  Doubt DVT given bilateral distribution.  Will check CBC, CMET, and TSH.  Recommended leg elevation, compression stockings, and diet low in salt.

## 2018-11-13 NOTE — Telephone Encounter (Signed)
Patient is being seen in the office now.

## 2018-11-13 NOTE — Assessment & Plan Note (Signed)
Continue Synthroid 100 mcg daily.  Check TSH today. 

## 2018-11-13 NOTE — Progress Notes (Signed)
   Subjective:  Holly [name] is a 84 y.o. female who presents today for same-day appointment with a chief complaint of LE edema.   HPI:  LE edema, Acute problem Started about a week ago.  Located in bilateral lower extremities.  No clear precipitating events.  She has been eating more sausage lately.  She has had varicose vein surgery about 50 years ago.  No history of VTE.  No treatments tried.  She has also been on her feet a lot within the last few days.  No chest pain or shortness of breath.  No treatments tried.  No other obvious alleviating or aggravating factors.  Hypothyroidism, chronic problem, stable On Synthroid 100 mcg daily.  Tolerating well.  ROS: Per HPI  PMH: She reports that she quit smoking about 32 years ago. She has never used smokeless tobacco. She reports that she does not drink alcohol or use drugs.  Objective:  Physical Exam: BP (!) 130/58 (BP Location: Left Arm, Patient Position: Sitting, Cuff Size: Normal)   Pulse 62   Temp 97.6 F (36.4 C) (Oral)   Ht 5' (1.524 m)   Wt 111 lb (50.3 kg)   SpO2 99%   BMI 21.68 kg/m   Wt Readings from Last 3 Encounters:  11/13/18 111 lb (50.3 kg)  10/19/18 108 lb (49 kg)  10/03/18 111 lb 9.6 oz (50.6 kg)  Gen: NAD, resting comfortably CV: RRR with no murmurs appreciated Pulm: NWOB, CTAB with no crackles, wheezes, or rhonchi MSK: -Legs: 2+ pitting edema to mid shin bilaterally.  Good distal cap refill.  Neurovascular intact distally.  Assessment/Plan:  Hypothyroidism Continue Synthroid 100 mcg daily.  Check TSH today.  Leg edema Likely venous insufficiency.  No signs of volume overload.  Doubt DVT given bilateral distribution.  Will check CBC, CMET, and TSH.  Recommended leg elevation, compression stockings, and diet low in salt.  [name]. Jerline Pain, MD 11/13/2018 4:51 PM

## 2018-11-13 NOTE — Telephone Encounter (Signed)
Pt called with complaints of swelling in her legs and ankles; this started 11/12/18; she says that the swelling is from her feet to half way up her leg; she describes her legs as "shiny"; recommendations made per nurse triage protocol; the pt normally sees Dr Dimas Chyle, [place]; is offered an appointment at 1620 11/13/18 and 11/14/18; the pt will call back to schedule when she contacts her grand daughter.   Reason for Disposition . [1] MODERATE leg swelling (e.g., swelling extends up to knees) AND [2] new onset or worsening  Answer Assessment - Initial Assessment Questions 1. ONSET: "When did the swelling start?" (e.g., minutes, hours, days)     11/11/18 2. LOCATION: "What part of the leg is swollen?"  "Are both legs swollen or just one leg?"     Both; legs to ankles 3. SEVERITY: "How bad is the swelling?" (e.g., localized; mild, moderate, severe)  - Localized - small area of swelling localized to one leg  - MILD pedal edema - swelling limited to foot and ankle, pitting edema < 1/4 inch (6 mm) deep, rest and elevation eliminate most or all swelling  - MODERATE edema - swelling of lower leg to knee, pitting edema > 1/4 inch (6 mm) deep, rest and elevation only partially reduce swelling  - SEVERE edema - swelling extends above knee, facial or hand swelling present      Moderate 4. REDNESS: "Does the swelling look red or infected?"     no 5. PAIN: "Is the swelling painful to touch?" If so, ask: "How painful is it?"   (Scale 1-10; mild, moderate or severe)     no 6. FEVER: "Do you have a fever?" If so, ask: "What is it, how was it measured, and when did it start?"      no 7. CAUSE: "What do you think is causing the leg swelling?"     fluid 8. MEDICAL HISTORY: "Do you have a history of heart failure, kidney disease, liver failure, or cancer?"     no 9. RECURRENT SYMPTOM: "Have you had leg swelling before?" If so, ask: "When was the last time?" "What happened that time?"     Leg  swelling usually goes away at night 10. OTHER SYMPTOMS: "Do you have any other symptoms?" (e.g., chest pain, difficulty breathing)       no 11. PREGNANCY: "Is there any chance you are pregnant?" "When was your last menstrual period?"       no  Protocols used: LEG SWELLING AND EDEMA-A-AH

## 2018-11-13 NOTE — Telephone Encounter (Signed)
See note

## 2018-11-13 NOTE — Patient Instructions (Signed)
It was very nice to see you today!  I think the swelling in your legs is due to poor vein function.  There are several things that could be causing swelling in your legs.  We will check some blood work today.  Please keep your legs elevated as much as possible.  Please also avoid salt and use compression stockings.  If your symptoms worsen or do not improve with above, please let me know.  Take care, Dr Jerline Pain

## 2018-11-13 NOTE — Telephone Encounter (Signed)
Pt schedule appointment with Dr Dimas Chyle, [facility], per [place]; appointment 11/13/18 at 1620; will route to office for notification.

## 2018-11-14 ENCOUNTER — Other Ambulatory Visit: Payer: Self-pay

## 2018-11-14 LAB — COMPREHENSIVE METABOLIC PANEL
ALT: 10 U/L (ref 0–35)
AST: 21 U/L (ref 0–37)
Albumin: 3.7 g/dL (ref 3.5–5.2)
Alkaline Phosphatase: 70 U/L (ref 39–117)
BUN: 20 mg/dL (ref 6–23)
CO2: 27 mEq/L (ref 19–32)
Calcium: 9.2 mg/dL (ref 8.4–10.5)
Chloride: 105 mEq/L (ref 96–112)
Creatinine, Ser: 0.88 mg/dL (ref 0.40–1.20)
GFR: 64.33 mL/min (ref >60.00)
Glucose, Bld: 86 mg/dL (ref 70–99)
Potassium: 4.5 mEq/L (ref 3.5–5.1)
Sodium: 140 mEq/L (ref 135–145)
Total Bilirubin: 1.1 mg/dL (ref 0.2–1.2)
Total Protein: 6.5 g/dL (ref 6.0–8.3)

## 2018-11-14 LAB — CBC
HCT: 39 % (ref 36.0–46.0)
Hemoglobin: 12.8 g/dL (ref 12.0–15.0)
MCHC: 32.7 g/dL (ref 30.0–36.0)
MCV: 94.4 fl (ref 78.0–100.0)
Platelets: 224 10*3/uL (ref 150.0–400.0)
RBC: 4.13 Mil/uL (ref 3.87–5.11)
RDW: 15.9 % — ABNORMAL HIGH (ref 11.5–15.5)
WBC: 6 10*3/uL (ref 4.0–10.5)

## 2018-11-14 LAB — TSH: TSH: 0.88 u[IU]/mL (ref 0.35–4.50)

## 2018-11-14 MED ORDER — ATORVASTATIN CALCIUM 40 MG PO TABS: 40.0000 mg | ORAL_TABLET | Freq: Every day | ORAL | 3 refills | Status: DC

## 2018-11-14 MED ORDER — LEVOTHYROXINE SODIUM 100 MCG PO TABS: 100.0000 ug | ORAL_TABLET | Freq: Every day | ORAL | 3 refills | Status: DC

## 2018-11-14 NOTE — Progress Notes (Signed)
Please inform patient of the following:  Her blood work is all NORMAL. I would like for her to continue taking 144mcg of synthroid. We do not need to make any other changes to her treatment plan at this time. Like we discussed ruing her visit, the swelling in her legs is due to poor vein function. I would like for her to make sure she is avoiding salt, keeping her legs elevated, and using compression stockings. Would like to see her back in 1-2 weeks if the swelling worsens or does not improve.  [name]. Jerline Pain, MD 11/14/2018 1:06 PM

## 2018-11-24 ENCOUNTER — Ambulatory Visit (INDEPENDENT_AMBULATORY_CARE_PROVIDER_SITE_OTHER): Payer: Medicare Other | Admitting: Family Medicine

## 2018-11-24 ENCOUNTER — Encounter

## 2018-11-24 ENCOUNTER — Encounter: Payer: Self-pay | Admitting: Family Medicine

## 2018-11-24 VITALS — BP 134/66 | HR 68 | Temp 98.5°F | Ht 60.0 in | Wt 107.8 lb

## 2018-11-24 DIAGNOSIS — J329 Chronic sinusitis, unspecified: Secondary | ICD-10-CM | POA: Diagnosis not present

## 2018-11-24 MED ORDER — DOXYCYCLINE HYCLATE 100 MG PO TABS: 100.0000 mg | ORAL_TABLET | Freq: Two times a day (BID) | ORAL | 0 refills | Status: DC

## 2018-11-24 NOTE — Patient Instructions (Signed)
Start the atrovent.  Start the doxycycline if your symptoms worsen or do not improve in a few days.  Please stay well hydrated.  You can take tylenol and/or motrin as needed for low grade fever and pain.  Please let me know if your symptoms worsen or fail to improve.  Take care, Dr Jerline Pain

## 2018-11-24 NOTE — Progress Notes (Signed)
   Subjective:  Holly [name] is a 84 y.o. female who presents today for same-day appointment with a chief complaint of cough.   HPI:  Cough, Acute problem Started 3 days ago. Worsened over that time.  Associated with ear pain/popping, sinus congestion, and headache.  She took half of an over-the-counter Tylenol which helped with her symptoms.  No sputum production.  No wheeze.  No shortness of breath.  Some subjective fevers and chills a few days ago.  She had one sick contact about a week ago.  No other treatments tried.  No other obvious alleviating or aggravating factors.  ROS: Per HPI  PMH: She reports that she quit smoking about 33 years ago. She has never used smokeless tobacco. She reports that she does not drink alcohol or use drugs.  Objective:  Physical Exam: BP 134/66 (BP Location: Left Arm, Patient Position: Sitting, Cuff Size: Normal)   Pulse 68   Temp 98.5 F (36.9 C) (Oral)   Ht 5' (1.524 m)   Wt 107 lb 12 oz (48.9 kg)   SpO2 97%   BMI 21.04 kg/m   Gen: NAD, resting comfortably HEENT: Right TM with clear effusion.  Left TM clear.  OP slightly erythematous with no exudate.  Nose mucosa boggy and erythematous with thick, white discharge. CV: RRR with no murmurs appreciated Pulm: NWOB, CTAB with no crackles, wheezes, or rhonchi  Assessment/Plan:  Sinusitis Likely secondary to viral URI. No signs of bacterial infection. Start atrovent for rhinorrhea/sinus congestion.  Sent in a "pocket prescription" for doxycycline with strict instruction to not start unless symptoms worsen or fail to improve within the next several days. Recommended tylenol and/or motrin as needed for low grade fever and pain. Encouraged good oral hydration. Return precautions reviewed. Follow up as needed.   [name]. Jerline Pain, MD 11/24/2018 10:57 AM

## 2018-11-27 ENCOUNTER — Ambulatory Visit: Payer: Self-pay

## 2018-11-27 NOTE — Telephone Encounter (Signed)
See note

## 2018-11-27 NOTE — Telephone Encounter (Signed)
Son Cecilie Lowers called and stated that the pt vomited her antibiotic this morning. Son was asking should he continue the antibiotic or does pt need a new prescription for a different antibiotic.  Reason for Disposition . Caller has NON-URGENT medication question about med that PCP prescribed and triager unable to answer question  Answer Assessment - Initial Assessment Questions 1. SYMPTOMS: "Do you have any symptoms?"     Son Cecilie Lowers called and stated that the pt vomited her antibiotic this morning. Son was asking should he continue the antibiotic or does pt need a new prescription for a different antibiotic.  2. SEVERITY: If symptoms are present, ask "Are they mild, moderate or severe?"     mild  Protocols used: MEDICATION QUESTION CALL-A-AH

## 2018-11-27 NOTE — Telephone Encounter (Signed)
Was this her first dose?? If so, we can send in something else.  [name]. Jerline Pain, MD 11/27/2018 4:48 PM

## 2018-11-27 NOTE — Telephone Encounter (Signed)
Please advise 

## 2018-11-28 NOTE — Telephone Encounter (Signed)
Patient son Cecilie Lowers stated patient took a second dose of antibiotic and had no issues. I informed to continue medication. If any symptoms worsen give [place] a call back.

## 2018-12-10 ENCOUNTER — Other Ambulatory Visit: Payer: Self-pay | Admitting: Family Medicine

## 2019-01-06 ENCOUNTER — Other Ambulatory Visit: Payer: Self-pay | Admitting: Family Medicine

## 2019-04-04 ENCOUNTER — Ambulatory Visit: Payer: Medicare Other | Admitting: Adult Health

## 2019-05-04 ENCOUNTER — Telehealth: Payer: Self-pay | Admitting: Family Medicine

## 2019-05-04 NOTE — Telephone Encounter (Signed)
Please triage as soon as possible. Khamika aware.  Copied from [place] [phone]. Topic: Appointment Scheduling - Scheduling Inquiry for Clinic >> May 04, 2019  9:31 AM Mathis Bud wrote: Reason for CRM: Patient daughter called to set up a follow up appt. She is having shallow breathing, Patient is very tired all the time.  Family is concerned.   Call back: Makira Holleman [phone]

## 2019-05-04 NOTE — Telephone Encounter (Signed)
Patient having difficulty breathing and rib pain and very fatigue.Patient son noticed symptoms for about 1 week.Patient son thinks she may also be having memory issues.Son stated patient will be very upset if she knew that he called the clinic.Imformed patient son if symptoms worsen please take her to the ED.I will call back to schedule appt

## 2019-05-04 NOTE — Telephone Encounter (Signed)
Notified patient and he voices understanding.

## 2019-05-04 NOTE — Telephone Encounter (Signed)
85 yo with SOB; that requires a visit and ER due to covid. Please send to ER.  If breathing is ok, then can send to Dr. Jerline Pain for his input since he knows her and he can decide how to best handle trying to get her in.

## 2019-06-21 ENCOUNTER — Telehealth: Payer: Self-pay

## 2019-06-21 NOTE — Telephone Encounter (Signed)
Spoke with pts daughter Dola Argyle who stated patient has been having SOB and loss of appetite for at least 2 weeks.The pts daughter stated the patient has been having O2 levels ranging from 64%-70%. I informed the patients daughter that they would need to be seen in the ER due to the low O2 levels. The pts daughter also stated the pt also has been getting more forgetful recently.

## 2019-06-21 NOTE — Telephone Encounter (Signed)
Spoke with patient she stated she is having SOB with doing a lot of walking and  work and feeling more fatigued she stated its due to her being 85 yo. Oxygen levels has been fluctuating 64-70%. Symptoms also include left ear ringing,not constant,no ear pain. Patient daughter will take her to urgent care.

## 2019-06-22 ENCOUNTER — Ambulatory Visit: Payer: Medicare Other | Admitting: Physician Assistant

## 2019-07-06 ENCOUNTER — Encounter: Payer: Self-pay | Admitting: Family Medicine

## 2019-07-06 ENCOUNTER — Ambulatory Visit (INDEPENDENT_AMBULATORY_CARE_PROVIDER_SITE_OTHER): Payer: Medicare Other | Admitting: Family Medicine

## 2019-07-06 ENCOUNTER — Other Ambulatory Visit: Payer: Self-pay

## 2019-07-06 ENCOUNTER — Encounter: Payer: Medicare Other | Admitting: Family Medicine

## 2019-07-06 VITALS — BP 136/68 | HR 63 | Temp 98.4°F | Ht 60.0 in | Wt 97.2 lb

## 2019-07-06 DIAGNOSIS — D692 Other nonthrombocytopenic purpura: Secondary | ICD-10-CM

## 2019-07-06 DIAGNOSIS — G3184 Mild cognitive impairment, so stated: Secondary | ICD-10-CM

## 2019-07-06 DIAGNOSIS — E785 Hyperlipidemia, unspecified: Secondary | ICD-10-CM

## 2019-07-06 DIAGNOSIS — J449 Chronic obstructive pulmonary disease, unspecified: Secondary | ICD-10-CM | POA: Diagnosis not present

## 2019-07-06 DIAGNOSIS — E038 Other specified hypothyroidism: Secondary | ICD-10-CM

## 2019-07-06 DIAGNOSIS — Z0000 Encounter for general adult medical examination without abnormal findings: Secondary | ICD-10-CM

## 2019-07-06 DIAGNOSIS — M25532 Pain in left wrist: Secondary | ICD-10-CM | POA: Diagnosis not present

## 2019-07-06 DIAGNOSIS — H6982 Other specified disorders of Eustachian tube, left ear: Secondary | ICD-10-CM | POA: Diagnosis not present

## 2019-07-06 DIAGNOSIS — R739 Hyperglycemia, unspecified: Secondary | ICD-10-CM | POA: Diagnosis not present

## 2019-07-06 LAB — COMPREHENSIVE METABOLIC PANEL
ALT: 10 U/L (ref 0–35)
AST: 22 U/L (ref 0–37)
Albumin: 4.1 g/dL (ref 3.5–5.2)
Alkaline Phosphatase: 78 U/L (ref 39–117)
BUN: 17 mg/dL (ref 6–23)
CO2: 25 mEq/L (ref 19–32)
Calcium: 9.7 mg/dL (ref 8.4–10.5)
Chloride: 104 mEq/L (ref 96–112)
Creatinine, Ser: 0.93 mg/dL (ref 0.40–1.20)
GFR: 56.7 mL/min — ABNORMAL LOW (ref >60.00)
Glucose, Bld: 85 mg/dL (ref 70–99)
Potassium: 4.3 mEq/L (ref 3.5–5.1)
Sodium: 139 mEq/L (ref 135–145)
Total Bilirubin: 1.2 mg/dL (ref 0.2–1.2)
Total Protein: 6.6 g/dL (ref 6.0–8.3)

## 2019-07-06 LAB — LIPID PANEL
Cholesterol: 129 mg/dL (ref 0–200)
HDL: 63.4 mg/dL (ref >39.00)
LDL Cholesterol: 53 mg/dL (ref 0–99)
NonHDL: 65.13
Total CHOL/HDL Ratio: 2
Triglycerides: 62 mg/dL (ref 0.0–149.0)
VLDL: 12.4 mg/dL (ref 0.0–40.0)

## 2019-07-06 LAB — CBC
HCT: 37 % (ref 36.0–46.0)
Hemoglobin: 12.3 g/dL (ref 12.0–15.0)
MCHC: 33.4 g/dL (ref 30.0–36.0)
MCV: 92.8 fl (ref 78.0–100.0)
Platelets: 230 10*3/uL (ref 150.0–400.0)
RBC: 3.98 Mil/uL (ref 3.87–5.11)
RDW: 15.9 % — ABNORMAL HIGH (ref 11.5–15.5)
WBC: 5.4 10*3/uL (ref 4.0–10.5)

## 2019-07-06 LAB — TSH: TSH: 1.37 u[IU]/mL (ref 0.35–4.50)

## 2019-07-06 LAB — HEMOGLOBIN A1C: Hgb A1c MFr Bld: 5.7 % (ref 4.6–6.5)

## 2019-07-06 MED ORDER — ALBUTEROL SULFATE HFA 108 (90 BASE) MCG/ACT IN AERS: 1.0000 | INHALATION_SPRAY | Freq: Four times a day (QID) | RESPIRATORY_TRACT | 5 refills | Status: DC | PRN

## 2019-07-06 NOTE — Assessment & Plan Note (Signed)
Stable.  Albuterol refilled. ?

## 2019-07-06 NOTE — Assessment & Plan Note (Signed)
Abnormal mini cog.  Recommended memory exercises at home.  Does not start medication at this point.

## 2019-07-06 NOTE — Assessment & Plan Note (Signed)
Due to antiplatelets. Reassured patient.

## 2019-07-06 NOTE — Assessment & Plan Note (Signed)
Check lipid panel.  Continue Lipitor 40 mg daily. 

## 2019-07-06 NOTE — Progress Notes (Signed)
Chief Complaint:  Holly [name] is a 85 y.o. female who presents today for a subsequent Medicare Annual Wellness Visit and to discuss management of her chronic medical problems.  Assessment/Plan:  Senile purpura (HCC) Due to antiplatelets. Reassured patient.   COPD mixed type ([place]) Stable.  Albuterol refilled.  Dyslipidemia Check lipid panel.  Continue Lipitor 40 mg daily.  Hypothyroidism Check TSH.  Continue levothyroxine 100 mcg daily.  Mild cognitive impairment Abnormal mini cog.  Recommended memory exercises at home.  Does not start medication at this point.  Weight Loss Discussed 10 pound weight loss over the last 6 months.  Encouraged diet high in protein and fat.  Check CBC, C met, and TSH.   Preventative Healthcare Due for flu vaccine later this year. Due for DEXA scan.  Otherwise UTD on vaccines and screenings.  Health Maintenance Due  Topic Date Due  . DEXA SCAN  01/02/1995  . INFLUENZA VACCINE  06/30/2019    During the course of the visit the patient was educated and counseled about appropriate screening and preventive services including:        Fall prevention   Nutrition Physical Activity Weight Management Cognition    Subjective:  HPI:  Health Risk Assessment: Patient considers her overall health to be good. He has no difficulty performing the following: . Preparing food and eating . Bathing  . Getting dressed . Using the toilet . Shopping . Managing Finances . Moving around from place to place  She has not had any falls within the past year.   Depression screen PHQ 2/9 07/06/2019  Decreased Interest 0  Down, Depressed, Hopeless 0  PHQ - 2 Score 0   Lifestyle Factors: Diet: Does not eat as much as she used to due to lack of appetite. Exercise: Tries to walk around her house daily.  Patient Care Team: Vivi Barrack, MD as PCP - General (Family Medicine) Evans Lance, MD as PCP - Cardiology (Cardiology)   She has no acute  complaints today.   Over the last couple weeks she has noticed fullness in left ear.  Associated with some feelings of dizziness and pressure-like sensation.  No pain.  No drainage.  No treatments tried.  She also bumped her wrist yesterday while pulling tomatoes and has had some bruising to the area.  Her chronic medical conditions are outlined below:  # Dyslipidemia - On lipitor 23m daily and tolerating well - ROS: No reported mylagias  # Hypothyroidism - On levothyroxine 1055m daily and tolerating well  # COPD - On fluticazone-salmeterol twice daily and albuterol as needed - ROS: No reported shortness of breath  ROS: Per HPI  PMH:  The following were reviewed and entered/updated in epic: Past Medical History:  Diagnosis Date  . CAD (coronary artery disease)   . CELLULITIS 06/18/2008  . CHF (congestive heart failure) ([facility]  . CHRONIC OBSTRUCTIVE PULMONARY DISEASE, ACUTE EXACERBATION 11/16/2007  . COPD 05/18/2007  . GERD 12/25/2007  . HYPERLIPIDEMIA 05/18/2007  . HYPOTHYROIDISM 05/18/2007  . Macular degeneration   . Stroke ([facility]  . Urosepsis    Past Surgical History:  Procedure Laterality Date  . CATARACT EXTRACTION    . DILATION AND CURETTAGE OF UTERUS    . TEE WITHOUT CARDIOVERSION N/A 09/01/2018   Procedure: TRANSESOPHAGEAL ECHOCARDIOGRAM (TEE);  Surgeon: ChBuford DresserMD;  [facility];  Service: Cardiovascular;  Laterality: N/A;  . VARICOSE VEIN SURGERY     Family History  Problem Relation Age of  Onset  . Ovarian cancer Mother   . Diabetes Mellitus II Brother     Medications- reviewed and updated Current Outpatient Medications  Medication Sig Dispense Refill  . albuterol (PROVENTIL HFA) 108 (90 Base) MCG/ACT inhaler Inhale 1-2 puffs into the lungs every 6 (six) hours as needed for wheezing or shortness of breath. 20.1 g 5  . atorvastatin (LIPITOR) 40 MG tablet Take 1 tablet (40 mg total) by mouth daily at 6 PM. 30 tablet 0  .  Fluticasone-Salmeterol (WIXELA INHUB) 500-50 MCG/DOSE AEPB Inhale 1 puff into the lungs 2 (two) times daily.    Marland Kitchen ipratropium (ATROVENT) 0.06 % nasal spray PLACE 2 SPRAYS INTO BOTH NOSTRILS 4 TIMES DAILY. 15 mL 0  . levothyroxine (SYNTHROID, LEVOTHROID) 100 MCG tablet TAKE 1 TABLET BY MOUTH EVERY DAY 90 tablet 1  . Multiple Vitamins-Minerals (ICAPS PO) Take 1 capsule by mouth 2 (two) times daily.     No current facility-administered medications for this visit.    Allergies-reviewed and updated Allergies  Allergen Reactions  . Levofloxacin     REACTION: unspecified  . Macrobid [Nitrofurantoin Macrocrystal] Nausea Only  . Sulfamethoxazole     REACTION: unspecified  . Tramadol Other (See Comments)    Shakes   . Amoxicillin Rash    Has patient had a PCN reaction causing immediate rash, facial/tongue/throat swelling, SOB or lightheadedness with hypotension: No Has patient had a PCN reaction causing severe rash involving mucus membranes or skin necrosis: No Has patient had a PCN reaction that required hospitalization: No Has patient had a PCN reaction occurring within the last 10 years: No If all of the above answers are "NO", then may proceed with Cephalosporin use.   Made "bottom area" very raw    Social History   Socioeconomic History  . Marital status: Married    Spouse name: Not on file  . Number of children: Not on file  . Years of education: Not on file  . Highest education level: Not on file  Occupational History  . Not on file  Social Needs  . Financial resource strain: Not on file  . Food insecurity    Worry: Not on file    Inability: Not on file  . Transportation needs    Medical: Not on file    Non-medical: Not on file  Tobacco Use  . Smoking status: Former Smoker    Quit date: 11/29/1985    Years since quitting: 33.6  . Smokeless tobacco: Never Used  Substance and Sexual Activity  . Alcohol use: No  . Drug use: No  . Sexual activity: Not on file  Lifestyle   . Physical activity    Days per week: Not on file    Minutes per session: Not on file  . Stress: Not on file  Relationships  . Social Herbalist on phone: Not on file    Gets together: Not on file    Attends religious service: Not on file    Active member of club or organization: Not on file    Attends meetings of clubs or organizations: Not on file    Relationship status: Not on file  Other Topics Concern  . Not on file  Social History Narrative  . Not on file        Objective/Observations  Physical Exam: BP 136/68   Pulse 63   Temp 98.4 F (36.9 C)   Ht 5' (1.524 m)   Wt 97 lb 3.2 oz (44.1 kg)  SpO2 99%   BMI 18.98 kg/m  Wt Readings from Last 3 Encounters:  07/06/19 97 lb 3.2 oz (44.1 kg)  11/24/18 107 lb 12 oz (48.9 kg)  11/13/18 111 lb (50.3 kg)  Gen: NAD, resting comfortably HEENT: TMs normal bilaterally. OP clear. No thyromegaly noted.  CV: RRR with no murmurs appreciated Pulm: NWOB, CTAB with no crackles, wheezes, or rhonchi GI: Normal bowel sounds present. Soft, Nontender, Nondistended. MSK: no edema, cyanosis, or clubbing noted Skin: warm, dry Neuro: CN2-12 grossly intact. Strength 5/5 in upper and lower extremities. Reflexes symmetric and intact bilaterally. 1/3 delayed word recall.  Psych: Normal affect and thought content  No results found for this or any previous visit (from the past 24 hour(s)).      [name]. Jerline Pain, MD 07/06/2019 2:25 PM

## 2019-07-06 NOTE — Assessment & Plan Note (Signed)
Check TSH.  Continue levothyroxine 100 mcg daily. 

## 2019-07-06 NOTE — Patient Instructions (Signed)
It was very nice to see you today!  You can take an allergy medication for your ear if you want.  Let me know if your wrist does not heal.  We will check blood work today.  I will refill your albuterol today.   Come back in 6 months   Take care, Dr Jerline Pain  Please try these tips to maintain a healthy lifestyle:   Eat at least 3 REAL meals and 1-2 snacks per day.  Aim for no more than 5 hours between eating.  If you eat breakfast, please do so within one hour of getting up.    Obtain twice as many fruits/vegetables as protein or carbohydrate foods for both lunch and dinner. (Half of each meal should be fruits/vegetables, one quarter protein, and one quarter starchy carbs)   Cut down on sweet beverages. This includes juice, soda, and sweet tea.    Exercise at least 150 minutes every week.

## 2019-07-10 NOTE — Progress Notes (Signed)
Please inform patient of the following:  All of her labs are NORMAL. No clear explanation for her weight loss. Would like for her to make sure she is eating a diet with plenty of protein and let me know if she continues to have issues with weight. Would like to see her back in about 6 months, or sooner if needed.  Holly [name]. Jerline Pain, MD 07/10/2019 8:06 AM

## 2019-08-01 ENCOUNTER — Encounter: Payer: Self-pay | Admitting: Adult Health

## 2019-08-01 ENCOUNTER — Ambulatory Visit (INDEPENDENT_AMBULATORY_CARE_PROVIDER_SITE_OTHER): Payer: Medicare Other | Admitting: Adult Health

## 2019-08-01 ENCOUNTER — Other Ambulatory Visit: Payer: Self-pay

## 2019-08-01 VITALS — BP 144/70 | HR 59 | Temp 97.1°F | Ht 65.0 in | Wt 96.6 lb

## 2019-08-01 DIAGNOSIS — I639 Cerebral infarction, unspecified: Secondary | ICD-10-CM

## 2019-08-01 DIAGNOSIS — H3530 Unspecified macular degeneration: Secondary | ICD-10-CM

## 2019-08-01 DIAGNOSIS — E785 Hyperlipidemia, unspecified: Secondary | ICD-10-CM

## 2019-08-01 NOTE — Patient Instructions (Addendum)
Will be highly recommended for you to restart aspirin for secondary stroke prevention -please speak with your friend in regards to being referred to ophthalmologist for further evaluation of your vision and discussion regarding restarting aspirin for stroke prevention  If you would like, I can also refer you to Dr. Katy Fitch at [facility] eye care located on [address] -please call office if interested  Continue Lipitor for secondary stroke prevention  Continue to follow up with PCP regarding cholesterol and blood pressure monitoring/management   Continue to monitor blood pressure at home  Maintain strict control of hypertension with blood pressure goal below 130/90, diabetes with hemoglobin A1c goal below 6.5% and cholesterol with LDL cholesterol (bad cholesterol) goal below 70 mg/dL. I also advised the patient to eat a healthy diet with plenty of whole grains, cereals, fruits and vegetables, exercise regularly and maintain ideal body weight.  Followup in the future with me in 6 months or call earlier if needed       Thank you for coming to see [place] at [facility]. I hope we have been able to provide you high quality care today.  You may receive a patient satisfaction survey over the next few weeks. We would appreciate your feedback and comments so that we may continue to improve ourselves and the health of our patients.

## 2019-08-01 NOTE — Progress Notes (Signed)
I agree with the above plan 

## 2019-08-01 NOTE — Progress Notes (Signed)
[facility] [address] [place]. [place] [address] [phone]       OFFICE FOLLOW-UP NOTE  Holly [name] Date of Birth:  [birth year] Medical Record Number:  [id number]  [facility] provider: Dr. Pearlean Brownie   Chief Complaint  Patient presents with   Follow-up    Room 9, with son. cryptogenic stroke.        HPI:  Initial visit 10/03/2018 PS: [name] is a 85 year old pleasant Caucasian lady seen today for initial office follow-up visit following hospital admission for stroke.  She is accompanied by her daughter [name].  History is obtained from them and review of electronic medical records.  I personally reviewed imaging films.HPI ( Dr Laurence Slate ) ;Holly [name] is an 85 y.o. female  with PMH of CHF, CAD, hypthyorodism, HLD presents with transient episode of slurred speech and gait instability that occurred around 6 PM yesterday evening witnessed by her son.  Patient's son insisted she come to the hospital and evaluated today in the ER.  MRI brain shows small acute infarcts in the right parietal lobe and insula.  She was admitted for stroke work-up and neurology was consulted.  Patient not on ASA due to advice from her ophthalmologist for macular degeneration?  She also states that she had some palpitations/quivering in her neck prior to this episode. Date last known well: 10.1.19 Time last known well: 6pm tPA Given: no, outside window NIHSS: 0 Baseline MRS 0  .  CT scan of the head showed diffuse atrophy and white matter changes no acute abnormality.  MRI scan of the brain showed 2 tiny right insular cortex and right parietal embolic infarcts.  MRI of the brain showed no large vessel stenosis but small 1 to 2 mm left anterior communicating artery aneurysm.  LDL cholesterol was 52 mg percent.  Hemoglobin A1c was 5.2.  Carotid ultrasound showed no extracranial stenosis.  Transthoracic echo showed normal ejection fraction and transesophageal echo showed no definite cardiac source of  embolism.  Cardiology was consulted for loop recorder placement but given patient's history of palpitations the night prior to the stroke decided to do a 30-day external heart monitor instead.  Patient is still wearing a heart monitor and so far proximal A. fib has not yet been found.  She is states she is done well since discharge.  Her speech and balance and walking have improved.  She is on aspirin 81 mg daily which is tolerating well with only minor bruising.  She is still quite independent and does all activities of daily living by herself.  She has no new complaints today.  Her blood pressure is apparently well controlled today it is 117/68.  Update 08/01/2019: [name] is a 85 year old female who is being seen today for stroke follow-up.  She has been doing well from a stroke standpoint.  She continues to live with her son but is able to maintain ADLs independently.  She did undergo a 30-day cardiac event monitor which was unremarkable for atrial fibrillation.  Discussion with Dr. Ladona Ridgel, cardiology, in regards to placement of ILR but she declined placement.  She discontinued aspirin 81 mg due to bruises after bumping her legs and reports she was told by her prior ophthalmologist that she should discontinue aspirin due to risk of bleeding in her eyes and history of macular degeneration.  Son has attempted to schedule follow-up visit with ophthalmology as prior visit canceled due to COVID-19 but he has not been able to schedule a follow-up  visit at this time.  He is requesting switching ophthalmologist from [facility] to [facility].  She has continued on atorvastatin without myalgias.  Recent lipid panel on 07/06/2019 showed LDL 53.  Blood pressure today 147/70.  No further concerns at this time.     ROS:   14 system review of systems is positive for no complaints and all other systems negative PMH:  Past Medical History:  Diagnosis Date   CAD (coronary artery disease)    CELLULITIS  06/18/2008   CHF (congestive heart failure) (HCC)    CHRONIC OBSTRUCTIVE PULMONARY DISEASE, ACUTE EXACERBATION 11/16/2007   COPD 05/18/2007   GERD 12/25/2007   HYPERLIPIDEMIA 05/18/2007   HYPOTHYROIDISM 05/18/2007   Macular degeneration    Stroke (HCC)    Urosepsis     Social History:  Social History   Socioeconomic History   Marital status: Married    Spouse name: Not on file   Number of children: Not on file   Years of education: Not on file   Highest education level: Not on file  Occupational History   Not on file  Social Needs   Financial resource strain: Not on file   Food insecurity    Worry: Not on file    Inability: Not on file   Transportation needs    Medical: Not on file    Non-medical: Not on file  Tobacco Use   Smoking status: Former Smoker    Quit date: 11/29/1985    Years since quitting: 33.6   Smokeless tobacco: Never Used  Substance and Sexual Activity   Alcohol use: No   Drug use: No   Sexual activity: Not on file  Lifestyle   Physical activity    Days per week: Not on file    Minutes per session: Not on file   Stress: Not on file  Relationships   Social connections    Talks on phone: Not on file    Gets together: Not on file    Attends religious service: Not on file    Active member of club or organization: Not on file    Attends meetings of clubs or organizations: Not on file    Relationship status: Not on file   Intimate partner violence    Fear of current or ex partner: Not on file    Emotionally abused: Not on file    Physically abused: Not on file    Forced sexual activity: Not on file  Other Topics Concern   Not on file  Social History Narrative   Not on file    Medications:   Current Outpatient Medications on File Prior to Visit  Medication Sig Dispense Refill   albuterol (PROVENTIL HFA) 108 (90 Base) MCG/ACT inhaler Inhale 1-2 puffs into the lungs every 6 (six) hours as needed for wheezing or shortness  of breath. 20.1 g 5   atorvastatin (LIPITOR) 40 MG tablet Take 1 tablet (40 mg total) by mouth daily at 6 PM. 30 tablet 0   Fluticasone-Salmeterol (WIXELA INHUB) 500-50 MCG/DOSE AEPB Inhale 1 puff into the lungs 2 (two) times daily.     ipratropium (ATROVENT) 0.06 % nasal spray PLACE 2 SPRAYS INTO BOTH NOSTRILS 4 TIMES DAILY. (Patient taking differently: PRN) 15 mL 0   levothyroxine (SYNTHROID, LEVOTHROID) 100 MCG tablet TAKE 1 TABLET BY MOUTH EVERY DAY 90 tablet 1   Multiple Vitamins-Minerals (ICAPS PO) Take 1 capsule by mouth 2 (two) times daily.     No current facility-administered  medications on file prior to visit.     Allergies:   Allergies  Allergen Reactions   Levofloxacin     REACTION: unspecified   Macrobid [Nitrofurantoin Macrocrystal] Nausea Only   Sulfamethoxazole     REACTION: unspecified   Tramadol Other (See Comments)    Shakes    Amoxicillin Rash    Has patient had a PCN reaction causing immediate rash, facial/tongue/throat swelling, SOB or lightheadedness with hypotension: No Has patient had a PCN reaction causing severe rash involving mucus membranes or skin necrosis: No Has patient had a PCN reaction that required hospitalization: No Has patient had a PCN reaction occurring within the last 10 years: No If all of the above answers are "NO", then may proceed with Cephalosporin use.   Made "bottom area" very raw    Today's Vitals   08/01/19 1101  BP: (!) 164/76  Pulse: (!) 59  Temp: (!) 97.1 F (36.2 C)  Weight: 96 lb 9.6 oz (43.8 kg)  Height: 5\' 5"  (1.651 m)   Body mass index is 16.08 kg/m.   Physical Exam General: Frail elderly Caucasian lady seated, in no evident distress Head: head normocephalic and atraumatic.  Neck: supple with no carotid or supraclavicular bruits Cardiovascular: regular rate and rhythm, no murmurs Musculoskeletal: no deformity Skin:  no rash/petichiae Vascular:  Normal pulses all extremities  Neurologic  Exam Mental Status: Awake and fully alert. Oriented to place and time. Recent and remote memory intact. Attention span, concentration and fund of knowledge appropriate. Mood and affect appropriate.  Cranial Nerves: Fundoscopic exam reveals sharp disc margins. Pupils equal, briskly reactive to light. Extraocular movements full without nystagmus. Visual fields full to confrontation. Hearing intact. Facial sensation intact.  Face, tongue, palate moves normally and symmetrically.  Motor: Normal bulk and tone. Normal strength in all tested extremity muscles.  Sensory.: intact to touch ,pinprick .position and vibratory sensation.  Coordination: Rapid alternating movements normal in all extremities. Finger-to-nose and heel-to-shin performed accurately bilaterally. Gait and Station: Arises from chair without difficulty. Stance is normal. Gait demonstrates normal stride length and balance . Able to heel, toe and tandem walk with mild difficulty.  Reflexes: 1+ and symmetric. Toes downgoing.     ASSESSMENT: 85 year old Caucasian lady with right MCA embolic infarction October 2019 of cryptogenic etiology.  30-day cardiac event monitor negative for atrial fibrillation.  Patient declined ILR placement.  Vascular risk factors of age and hyperlipidemia only.  She has been stable from a stroke standpoint without residual deficits or new/recurring stroke/TIA symptoms     PLAN: -Highly recommend reinitiating aspirin 81 mg daily for secondary stroke prevention.  She would like to follow-up with ophthalmology for their recommendation prior to initiating.  Discussion regarding risk of reoccurring stroke while off aspirin and the benefit of daily aspirin 81 mg for secondary stroke prevention outweighs risk of mild bruising or concerns by prior ophthalmologist -Son requesting to be referred to [facility] system to establish care with ophthalmologist.  Referral placed to Dr. Dione Booze at [facility] -Continue to follow  with PCP for HLD management -Continue to monitor blood pressure at home -Continue to stay active and maintain a healthy diet -maintain strict control of hypertension with blood pressure goal below 130/90, diabetes with hemoglobin A1c goal below 6.5% and lipids with LDL cholesterol goal below 70 mg/dL. I also advised the patient to eat a healthy diet with plenty of whole grains, cereals, fruits and vegetables, exercise regularly and maintain ideal body weight .   Follow-up  in 6 months or call earlier if needed   Greater than 50% of time during this 25 minute visit was spent on counseling,explanation of diagnosis of prior stroke, discussion regarding importance of aspirin, discussion regarding importance of HLD management and monitoring by PCP, planning of further management, discussion with patient and family and answering all questions to patient/son satisfaction  Ihor Austin [facility]), AGNP-BC  [facility] [address] [place], [place] [address]  Phone [phone] Fax [phone] Note: This document was prepared with digital dictation and possible smart phrase technology. Any transcriptional errors that result from this process are unintentional.

## 2019-09-14 ENCOUNTER — Other Ambulatory Visit: Payer: Self-pay | Admitting: Family Medicine

## 2019-09-14 MED ORDER — LEVOTHYROXINE SODIUM 100 MCG PO TABS: 100.0000 ug | ORAL_TABLET | Freq: Every day | ORAL | 1 refills | Status: DC

## 2019-09-14 NOTE — Telephone Encounter (Signed)
Medication: levothyroxine (SYNTHROID, LEVOTHROID) 100 MCG tablet OF:[id number]   Has the patient contacted their pharmacy? Yes  (Agent: If no, request that the patient contact the pharmacy for the refill.) (Agent: If yes, when and what did the pharmacy advise?)  Preferred Pharmacy (with phone number or street name): [id number] [address], [place]. [phone] (Phone) [phone] (Fax)    Agent: Please be advised that RX refills may take up to 3 business days. We ask that you follow-up with your pharmacy.

## 2019-09-17 DIAGNOSIS — Z961 Presence of intraocular lens: Secondary | ICD-10-CM | POA: Diagnosis not present

## 2019-09-17 DIAGNOSIS — H353134 Nonexudative age-related macular degeneration, bilateral, advanced atrophic with subfoveal involvement: Secondary | ICD-10-CM | POA: Diagnosis not present

## 2019-11-13 ENCOUNTER — Other Ambulatory Visit: Payer: Self-pay | Admitting: Family Medicine

## 2019-11-15 ENCOUNTER — Ambulatory Visit: Payer: Medicare Other

## 2019-11-16 ENCOUNTER — Other Ambulatory Visit: Payer: Self-pay

## 2019-11-19 ENCOUNTER — Other Ambulatory Visit: Payer: Self-pay

## 2019-11-19 ENCOUNTER — Ambulatory Visit (INDEPENDENT_AMBULATORY_CARE_PROVIDER_SITE_OTHER): Payer: Medicare Other | Admitting: Family Medicine

## 2019-11-19 ENCOUNTER — Encounter: Payer: Self-pay | Admitting: Family Medicine

## 2019-11-19 VITALS — BP 124/58 | HR 79 | Temp 98.0°F | Ht 65.0 in | Wt 95.0 lb

## 2019-11-19 DIAGNOSIS — H6982 Other specified disorders of Eustachian tube, left ear: Secondary | ICD-10-CM

## 2019-11-19 DIAGNOSIS — E038 Other specified hypothyroidism: Secondary | ICD-10-CM | POA: Diagnosis not present

## 2019-11-19 DIAGNOSIS — Z23 Encounter for immunization: Secondary | ICD-10-CM | POA: Diagnosis not present

## 2019-11-19 DIAGNOSIS — J449 Chronic obstructive pulmonary disease, unspecified: Secondary | ICD-10-CM

## 2019-11-19 DIAGNOSIS — R634 Abnormal weight loss: Secondary | ICD-10-CM | POA: Diagnosis not present

## 2019-11-19 DIAGNOSIS — E785 Hyperlipidemia, unspecified: Secondary | ICD-10-CM

## 2019-11-19 NOTE — Assessment & Plan Note (Signed)
Stable.  Continue albuterol. 

## 2019-11-19 NOTE — Assessment & Plan Note (Signed)
Last TSH at goal a few months ago.  Continue Synthroid 100 mcg daily.

## 2019-11-19 NOTE — Progress Notes (Signed)
   Chief Complaint:  Holly [name] is a 85 y.o. female who presents today with a chief complaint of weight loss.   Assessment/Plan:  Weight loss Weight stable since last visit.  Recommended continuing diet high protein and fat.  Follow-up in 3 to 6 months.  COPD mixed type ([place]) Stable.  Continue albuterol.  Dyslipidemia Last LDL at goal.  Continue Lipitor 40 mg daily.  Hypothyroidism Last TSH at goal a few months ago.  Continue Synthroid 100 mcg daily.  Eustachian tube dysfunction Continue Atrovent  Preventive health care Flu vaccine given today.    Subjective:  HPI:   Patient seen a few months ago for weight loss.  Had labs at that time which were negative.  Weight has been stable over the last few months.  She has been trying to eat more protein and fat in her diet.  She likes sausage.  She also likes daily.  She also had some fullness in her left ear.  She notes that whenever she uses her Atrovent symptoms seem to subside.  Her stable, chronic medical conditions are outlined below:  # Dyslipidemia - On lipitor 40mg  daily and tolerating well - ROS: No reported mylagias  # Hypothyroidism - On levothyroxine 117mcg daily and tolerating well  # COPD - On fluticasone-salmeterol twice daily and albuterol as needed - ROS: No reported shortness of breath  ROS: Per HPI  PMH: She reports that she quit smoking about 33 years ago. She has never used smokeless tobacco. She reports that she does not drink alcohol or use drugs.      Objective:  Physical Exam: BP (!) 124/58   Pulse 79   Temp 98 F (36.7 C)   Ht 5\' 5"  (1.651 m)   Wt 95 lb (43.1 kg)   SpO2 96%   BMI 15.81 kg/m   Wt Readings from Last 3 Encounters:  11/19/19 95 lb (43.1 kg)  08/01/19 96 lb 9.6 oz (43.8 kg)  07/06/19 97 lb 3.2 oz (44.1 kg)  Gen: NAD, resting comfortably HEENT: TMs clear CV: Regular rate and rhythm with no murmurs appreciated Pulm: Normal work of breathing, clear to auscultation  bilaterally with no crackles, wheezes, or rhonchi GI: Normal bowel sounds present. Soft, Nontender, Nondistended. MSK: No edema, cyanosis, or clubbing noted Skin: Warm, dry Neuro: Grossly normal, moves all extremities Psych: Normal affect and thought content      [name] M. Jerline Pain, MD 11/19/2019 2:33 PM

## 2019-11-19 NOTE — Patient Instructions (Signed)
It was very nice to see you today!  We will give your flu shot today.  Please use your nasal spray twice daily if needed.  Please make sure that you are eating a diet with plenty of fat and protein.  Come back in 3 to 6 months or sooner if needed  Take care, Dr Jerline Pain  Please try these tips to maintain a healthy lifestyle:   Eat at least 3 REAL meals and 1-2 snacks per day.  Aim for no more than 5 hours between eating.  If you eat breakfast, please do so within one hour of getting up.    Each meal should contain half fruits/vegetables, one quarter protein, and one quarter carbs (no bigger than a computer mouse)   Cut down on sweet beverages. This includes juice, soda, and sweet tea.     Drink at least 1 glass of water with each meal and aim for at least 8 glasses per day   Exercise at least 150 minutes every week.

## 2019-11-19 NOTE — Assessment & Plan Note (Signed)
Last LDL at goal.  Continue Lipitor 40 mg daily.

## 2019-11-19 NOTE — Assessment & Plan Note (Signed)
Weight stable since last visit.  Recommended continuing diet high protein and fat.  Follow-up in 3 to 6 months.

## 2019-12-01 ENCOUNTER — Emergency Department: Payer: Medicare Other

## 2019-12-01 ENCOUNTER — Emergency Department
Admission: EM | Admit: 2019-12-01 | Discharge: 2019-12-01 | Disposition: A | Discharge status: Home / Self Care | Payer: Medicare Other | Attending: Emergency Medicine | Admitting: Emergency Medicine

## 2019-12-01 ENCOUNTER — Other Ambulatory Visit: Payer: Self-pay

## 2019-12-01 DIAGNOSIS — Z79899 Other long term (current) drug therapy: Secondary | ICD-10-CM | POA: Insufficient documentation

## 2019-12-01 DIAGNOSIS — I2510 Atherosclerotic heart disease of native coronary artery without angina pectoris: Secondary | ICD-10-CM | POA: Diagnosis not present

## 2019-12-01 DIAGNOSIS — U071 COVID-19: Secondary | ICD-10-CM | POA: Insufficient documentation

## 2019-12-01 DIAGNOSIS — Z87891 Personal history of nicotine dependence: Secondary | ICD-10-CM | POA: Diagnosis not present

## 2019-12-01 DIAGNOSIS — I509 Heart failure, unspecified: Secondary | ICD-10-CM | POA: Insufficient documentation

## 2019-12-01 DIAGNOSIS — J449 Chronic obstructive pulmonary disease, unspecified: Secondary | ICD-10-CM | POA: Insufficient documentation

## 2019-12-01 DIAGNOSIS — E039 Hypothyroidism, unspecified: Secondary | ICD-10-CM | POA: Insufficient documentation

## 2019-12-01 DIAGNOSIS — R519 Headache, unspecified: Secondary | ICD-10-CM | POA: Diagnosis not present

## 2019-12-01 LAB — URINALYSIS, COMPLETE (UACMP) WITH MICROSCOPIC
Bilirubin Urine: NEGATIVE
Glucose, UA: NEGATIVE mg/dL
Hgb urine dipstick: NEGATIVE
Ketones, ur: NEGATIVE mg/dL
Nitrite: NEGATIVE
Protein, ur: 100 mg/dL — AB
Specific Gravity, Urine: 1.023 (ref 1.005–1.030)
WBC, UA: >50 WBC/hpf — ABNORMAL HIGH (ref 0–5)
pH: 5 (ref 5.0–8.0)

## 2019-12-01 LAB — CBC WITH DIFFERENTIAL/PLATELET
Abs Immature Granulocytes: 0.01 10*3/uL (ref 0.00–0.07)
Basophils Absolute: 0 10*3/uL (ref 0.0–0.1)
Basophils Relative: 1 %
Eosinophils Absolute: 0 10*3/uL (ref 0.0–0.5)
Eosinophils Relative: 1 %
HCT: 37.5 % (ref 36.0–46.0)
Hemoglobin: 12.5 g/dL (ref 12.0–15.0)
Immature Granulocytes: 0 %
Lymphocytes Relative: 28 %
Lymphs Abs: 1 10*3/uL (ref 0.7–4.0)
MCH: 29.9 pg (ref 26.0–34.0)
MCHC: 33.3 g/dL (ref 30.0–36.0)
MCV: 89.7 fL (ref 80.0–100.0)
Monocytes Absolute: 0.6 10*3/uL (ref 0.1–1.0)
Monocytes Relative: 17 %
Neutro Abs: 1.8 10*3/uL (ref 1.7–7.7)
Neutrophils Relative %: 53 %
Platelets: 144 10*3/uL — ABNORMAL LOW (ref 150–400)
RBC: 4.18 MIL/uL (ref 3.87–5.11)
RDW: 15.3 % (ref 11.5–15.5)
WBC: 3.4 10*3/uL — ABNORMAL LOW (ref 4.0–10.5)
nRBC: 0 % (ref 0.0–0.2)

## 2019-12-01 LAB — COMPREHENSIVE METABOLIC PANEL
ALT: 15 U/L (ref 0–44)
AST: 30 U/L (ref 15–41)
Albumin: 3.7 g/dL (ref 3.5–5.0)
Alkaline Phosphatase: 68 U/L (ref 38–126)
Anion gap: 12 (ref 5–15)
BUN: 17 mg/dL (ref 8–23)
CO2: 23 mmol/L (ref 22–32)
Calcium: 8.5 mg/dL — ABNORMAL LOW (ref 8.9–10.3)
Chloride: 99 mmol/L (ref 98–111)
Creatinine, Ser: 1.02 mg/dL — ABNORMAL HIGH (ref 0.44–1.00)
GFR calc Af Amer: 56 mL/min — ABNORMAL LOW (ref >60)
GFR calc non Af Amer: 49 mL/min — ABNORMAL LOW (ref >60)
Glucose, Bld: 93 mg/dL (ref 70–99)
Potassium: 3.9 mmol/L (ref 3.5–5.1)
Sodium: 134 mmol/L — ABNORMAL LOW (ref 135–145)
Total Bilirubin: 1 mg/dL (ref 0.3–1.2)
Total Protein: 6.7 g/dL (ref 6.5–8.1)

## 2019-12-01 LAB — POC SARS CORONAVIRUS 2 AG: SARS Coronavirus 2 Ag: POSITIVE — AB

## 2019-12-01 MED ORDER — AZITHROMYCIN 500 MG PO TABS: 500.0000 mg | ORAL_TABLET | Freq: Every day | ORAL | 0 refills | Status: AC

## 2019-12-01 MED ORDER — PREDNISONE 20 MG PO TABS: 60.0000 mg | ORAL_TABLET | Freq: Every day | ORAL | 0 refills | Status: AC

## 2019-12-01 MED ORDER — BENZONATATE 100 MG PO CAPS: 100.0000 mg | ORAL_CAPSULE | Freq: Three times a day (TID) | ORAL | 0 refills | Status: DC | PRN

## 2019-12-01 NOTE — ED Triage Notes (Signed)
Pt arrived via POV with reports of fever and headache. Pt states she had a fever of 102.1 this morning.  Pt states 2 days she started having a headache and cough and nausea.  Pt was brought to ED by granddaughter Glenard Haring [phone].

## 2019-12-01 NOTE — ED Provider Notes (Signed)
[facility] Emergency Department Provider Note  ____________________________________________   First MD Initiated Contact with Patient 12/01/19 2003     (approximate)  I have reviewed the triage vital signs and the nursing notes.   HISTORY  Chief Complaint Headache and Fever    HPI Holly [name] is a 86 y.o. female with below list of previous medical conditions presents to the emergency department secondary to cough, fever headache x2 days.  Patient states temperature was 102.1 this morning.  Patient does admit to nausea as well.  Patient states that her son was ill but that he was "negative for Covid".  Patient denies any dysuria however does admit to urinary frequency.  Patient denies any vomiting no diarrhea constipation.  Patient denies any abdominal pain.        Past Medical History:  Diagnosis Date  . CAD (coronary artery disease)   . CELLULITIS 06/18/2008  . CHF (congestive heart failure) ([place])   . CHRONIC OBSTRUCTIVE PULMONARY DISEASE, ACUTE EXACERBATION 11/16/2007  . COPD 05/18/2007  . GERD 12/25/2007  . HYPERLIPIDEMIA 05/18/2007  . HYPOTHYROIDISM 05/18/2007  . Macular degeneration   . Stroke ([place])   . Urosepsis     Patient Active Problem List   Diagnosis Date Noted  . Weight loss 11/19/2019  . Mild cognitive impairment 07/06/2019  . Leg edema 11/13/2018  . Senile purpura ([place]) 09/07/2018  . Macular degeneration 09/05/2018  . Bradycardia 09/05/2018  . Palpitations 09/05/2018  . CVA (cerebral vascular accident) ([place]) 08/30/2018  . GERD 12/25/2007  . Hypothyroidism 05/18/2007  . Dyslipidemia 05/18/2007  . COPD mixed type ([place]) 05/18/2007    Past Surgical History:  Procedure Laterality Date  . CATARACT EXTRACTION    . DILATION AND CURETTAGE OF UTERUS    . TEE WITHOUT CARDIOVERSION N/A 09/01/2018   Procedure: TRANSESOPHAGEAL ECHOCARDIOGRAM (TEE);  Surgeon: Buford Dresser, MD;  Location: [facility] ENDOSCOPY;  Service:  Cardiovascular;  Laterality: N/A;  . VARICOSE VEIN SURGERY      Prior to Admission medications   Medication Sig Start Date End Date Taking? Authorizing Provider  albuterol (PROVENTIL HFA) 108 (90 Base) MCG/ACT inhaler Inhale 1-2 puffs into the lungs every 6 (six) hours as needed for wheezing or shortness of breath. 07/06/19   Vivi Barrack, MD  atorvastatin (LIPITOR) 40 MG tablet TAKE 1 TABLET BY MOUTH EVERY DAY 11/13/19   Vivi Barrack, MD  Fluticasone-Salmeterol [facility] INHUB) 500-50 MCG/DOSE AEPB Inhale 1 puff into the lungs 2 (two) times daily.    [provider]  ipratropium (ATROVENT) 0.06 % nasal spray PLACE 2 SPRAYS INTO BOTH NOSTRILS 4 TIMES DAILY. Patient taking differently: PRN 09/29/18   Vivi Barrack, MD  levothyroxine (SYNTHROID) 100 MCG tablet Take 1 tablet (100 mcg total) by mouth daily. 09/14/19   Vivi Barrack, MD  Multiple Vitamins-Minerals (ICAPS PO) Take 1 capsule by mouth 2 (two) times daily.    [provider]    Allergies Levofloxacin, Macrobid [nitrofurantoin macrocrystal], Sulfamethoxazole, Tramadol, and Amoxicillin  Family History  Problem Relation Age of Onset  . Ovarian cancer Mother   . Diabetes Mellitus II Brother     Social History Social History   Tobacco Use  . Smoking status: Former Smoker    Quit date: 11/29/1985    Years since quitting: 34.0  . Smokeless tobacco: Never Used  Substance Use Topics  . Alcohol use: No  . Drug use: No    Review of Systems Constitutional: Positive for fever/chills Eyes: No  visual changes. ENT: No sore throat. Cardiovascular: Denies chest pain. Respiratory: Denies shortness of breath.  Positive for Gastrointestinal: No abdominal pain.  No nausea, no vomiting.  No diarrhea.  No constipation. Genitourinary: Negative for dysuria. Musculoskeletal: Negative for neck pain.  Negative for back pain. Integumentary: Negative for rash. Neurological: Negative for headaches, focal weakness or  numbness.  ____________________________________________   PHYSICAL EXAM:  VITAL SIGNS: ED Triage Vitals  Enc Vitals Group     BP 12/01/19 1553 (!) 120/55     Pulse Rate 12/01/19 1553 65     Resp 12/01/19 1553 18     Temp 12/01/19 1553 98.2 F (36.8 C)     Temp Source 12/01/19 1553 Oral     SpO2 12/01/19 1553 94 %     Weight 12/01/19 1552 43.1 kg (95 lb)     Height 12/01/19 1552 1.651 m (5\' 5" )     Head Circumference --      Peak Flow --      Pain Score 12/01/19 1552 0     Pain Loc --      Pain Edu? --      Excl. in [place]? --     Constitutional: Alert and oriented.  Eyes: Conjunctivae are normal.  Mouth/Throat: Patient is wearing a mask. Neck: No stridor.  No meningeal signs.   Cardiovascular: Normal rate, regular rhythm. Good peripheral circulation. Grossly normal heart sounds. Respiratory: Normal respiratory effort.  No retractions. Gastrointestinal: Soft and nontender. No distention.  Musculoskeletal: No lower extremity tenderness nor edema. No gross deformities of extremities. Neurologic:  Normal speech and language. No gross focal neurologic deficits are appreciated.  Skin:  Skin is warm, dry and intact. Psychiatric: Mood and affect are normal. Speech and behavior are normal.  ____________________________________________   LABS (all labs ordered are listed, but only abnormal results are displayed)  Labs Reviewed  COMPREHENSIVE METABOLIC PANEL - Abnormal; Notable for the following components:      Result Value   Sodium 134 (*)    Creatinine, Ser 1.02 (*)    Calcium 8.5 (*)    GFR calc non Af Amer 49 (*)    GFR calc Af Amer 56 (*)    All other components within normal limits  URINALYSIS, COMPLETE (UACMP) WITH MICROSCOPIC - Abnormal; Notable for the following components:   Color, Urine AMBER (*)    APPearance CLOUDY (*)    Protein, ur 100 (*)    Leukocytes,Ua LARGE (*)    WBC, UA >50 (*)    Bacteria, UA RARE (*)    All other components within normal limits   CBC WITH DIFFERENTIAL/PLATELET - Abnormal; Notable for the following components:   WBC 3.4 (*)    Platelets 144 (*)    All other components within normal limits  SARS CORONAVIRUS 2 (TAT 6-24 HRS)  POC SARS CORONAVIRUS 2 AG -  ED   ____  RADIOLOGY I, [place] N Kanye Depree, personally viewed and evaluated these images (plain radiographs) as part of my medical decision making, as well as reviewing the written report by the radiologist.  ED MD interpretation: No evidence of active infection, COPD chest x-ray impression per radiologist.  Official radiology report(s): DG Chest 2 View  Result Date: 12/01/2019 CLINICAL DATA:  Pt arrived via POV with reports of fever and headache. Pt states she had a fever of 102.1 this morning. Pt states 2 days she started having a headache and cough and nausea. EXAM: CHEST - 2 VIEW COMPARISON:  Chest radiograph  07/26/2018 FINDINGS: The heart size and mediastinal contours are within normal limits. Lungs are hyperinflated. Chronic coarse interstitial markings. No new focal infiltrate. Bandlike opacity at the right lung base likely represents scarring. No pneumothorax or pleural effusion. Diffuse osteopenia. IMPRESSION: 1. No evidence of active infection. 2. COPD. Electronically Signed   By: Audie Pinto M.D.   On: 12/01/2019 17:39      Procedures   ____________________________________________   INITIAL IMPRESSION / MDM / [place] / ED COURSE  As part of my medical decision making, I reviewed the following data within the [place] NUMBER   86 year old female presenting with above-stated history and physical exam concerning for COVID-19 infection which was confirmed in the emergency department.  Patient will be given azithromycin prednisone Tessalon for home.  Spoke with the patient at length regarding warning signs that would warrant immediate return to the emergency department.      ____________________________________________  FINAL CLINICAL IMPRESSION(S) / ED DIAGNOSES  Final diagnoses:  COVID-19 virus infection     MEDICATIONS GIVEN DURING THIS VISIT:  Medications - No data to display   ED Discharge Orders    None      *Please note:  Holly [name] was evaluated in Emergency Department on 12/01/2019 for the symptoms described in the history of present illness. She was evaluated in the context of the global COVID-19 pandemic, which necessitated consideration that the patient might be at risk for infection with the SARS-CoV-2 virus that causes COVID-19. Institutional protocols and algorithms that pertain to the evaluation of patients at risk for COVID-19 are in a state of rapid change based on information released by regulatory bodies including the CDC and federal and state organizations. These policies and algorithms were followed during the patient's care in the ED.  Some ED evaluations and interventions may be delayed as a result of limited staffing during the pandemic.*  Note:  This document was prepared using Dragon voice recognition software and may include unintentional dictation errors.   Gregor Hams, MD 12/01/19 2105

## 2019-12-02 ENCOUNTER — Other Ambulatory Visit: Payer: Self-pay | Admitting: Unknown Physician Specialty

## 2019-12-02 ENCOUNTER — Telehealth: Payer: Self-pay | Admitting: Unknown Physician Specialty

## 2019-12-02 DIAGNOSIS — U071 COVID-19: Secondary | ICD-10-CM

## 2019-12-02 LAB — SARS CORONAVIRUS 2 (TAT 6-24 HRS): SARS Coronavirus 2: POSITIVE — AB

## 2019-12-02 NOTE — Telephone Encounter (Signed)
  I connected by phone with Holly [name]'s son on 12/02/2019 at 53:22 AM to discuss the potential use of an new treatment for mild to moderate COVID-19 viral infection in non-hospitalized patients.  This patient is a 86 y.o. female that meets the FDA criteria for Emergency Use Authorization of bamlanivimab or casirivimab\imdevimab.  Has a (+) direct SARS-CoV-2 viral test result  Has mild or moderate COVID-19   Is ? 86 years of age and weighs ? 40 kg  Is NOT hospitalized due to COVID-19  Is NOT requiring oxygen therapy or requiring an increase in baseline oxygen flow rate due to COVID-19  Is within 10 days of symptom onset  Has at least one of the high risk factor(s) for progression to severe COVID-19 and/or hospitalization as defined in EUA.  Specific high risk criteria : >/= 84 yo   I have spoken and communicated the following to the patient or parent/caregiver:  1. FDA has authorized the emergency use of bamlanivimab and casirivimab\imdevimab for the treatment of mild to moderate COVID-19 in adults and pediatric patients with positive results of direct SARS-CoV-2 viral testing who are 84 years of age and older weighing at least 40 kg, and who are at high risk for progressing to severe COVID-19 and/or hospitalization.  2. The significant known and potential risks and benefits of bamlanivimab and casirivimab\imdevimab, and the extent to which such potential risks and benefits are unknown.  3. Information on available alternative treatments and the risks and benefits of those alternatives, including clinical trials.  4. Patients treated with bamlanivimab and casirivimab\imdevimab should continue to self-isolate and use infection control measures (e.g., wear mask, isolate, social distance, avoid sharing personal items, clean and disinfect "high touch" surfaces, and frequent handwashing) according to CDC guidelines.   5. The patient or parent/caregiver has the option to accept or refuse  bamlanivimab or casirivimab\imdevimab .  After reviewing this information with the patient, The patient agreed to proceed with receiving the bamlanimivab infusion and will be provided a copy of the Fact sheet prior to receiving the infusion. [name] 12/02/2019 11:22 AM  Sx onset January 1

## 2019-12-03 ENCOUNTER — Telehealth: Payer: Self-pay | Admitting: Family Medicine

## 2019-12-03 NOTE — Telephone Encounter (Signed)
[facility] - [place] TELEPHONE ADVICE RECORD AccessNurse Patient Name: Holly [name] Gender: Female DOB: Jul 19, 1928 Age: 86 Y 10 M 29 D Return Phone Number: [phone] (Primary), [phone] (Secondary) Address: City/State/Zip: [place] [address] Client [facility] - Human resources officer [facility] Night Physician Jacquiline Doe- MD Contact Type Call Who Is Calling Patient / Member / Family / Caregiver Call Type Triage / Clinical Caller Name Tasia Catchings Relationship To Patient Son Return Phone Number [phone] (Secondary) Chief Complaint Fever (non urgent symptom) (> THREE MONTHS) Reason for Call Symptomatic / Request for Health Information Initial Comment Caller states his mother has a fever of 101.8-102.3 and she is not feeling well. Translation No Nurse Assessment Nurse: Doylene Canard, RN, Lesa Date/Time (Eastern Time): 12/01/2019 12:36:53 PM Confirm and document reason for call. If symptomatic, describe symptoms. ---Caller states his mother has a fever, 101.1. She has a headache Has the patient had close contact with a person known or suspected to have the novel coronavirus illness OR traveled / lives in area with major community spread (including international travel) in the last 14 days from the onset of symptoms? * If Asymptomatic, screen for exposure and travel within the last 14 days. ---No Does the patient have any new or worsening symptoms? ---Yes Will a triage be completed? ---Yes Related visit to physician within the last 2 weeks? ---No Does the PT have any chronic conditions? (i.e. diabetes, asthma, this includes High risk factors for pregnancy, etc.) ---Yes List chronic conditions. ---COPD, Is this a behavioral health or substance abuse call? ---No Guidelines Guideline Title Affirmed Question Affirmed Notes Nurse Date/Time (Eastern Time) Coronavirus (COVID-19) - Diagnosed or Suspected [1]  Fever > 101 F (38.3 C) AND [2] age > 50 Conner, RN, Lesa 12/01/2019 12:38:50 PM Disp. Time Lamount Cohen Time) Disposition Final User 12/01/2019 12:35:01 PM Attempt made - message left Conner, RN, Lesa PLEASE NOTE: All timestamps contained within this report are represented as [place] Standard Time. CONFIDENTIALTY NOTICE: This fax transmission is intended only for the addressee. It contains information that is legally privileged, confidential or otherwise protected from use or disclosure. If you are not the intended recipient, you are strictly prohibited from reviewing, disclosing, copying using or disseminating any of this information or taking any action in reliance on or regarding this information. If you have received this fax in error, please notify us immediately by telephone so that we can arrange for its return to [place]. Phone: [phone], Toll-Free: [phone], Fax: [phone] Page: 2 of 2 Call Id: [id number] 12/01/2019 12:50:49 PM See HCP within 4 Hours (or PCP triage) Yes Conner, RN, Gean Maidens Caller Disagree/Comply Comply Caller Understands Yes PreDisposition Did not know what to do Care Advice Given Per Guideline SEE HCP WITHIN 4 HOURS (OR PCP TRIAGE): * IF OFFICE WILL BE CLOSED AND NO PCP (PRIMARY CARE PROVIDER) SECOND-LEVEL TRIAGE: You need to be seen within the next 3 or 4 hours. A nearby Urgent Care Center [facility]) is often a good source of care. Another choice is to go to the ED. Go sooner if you become worse. * Telemedicine may be your best choice for care during this COVID-19 outbreak. ALTERNATE DISPOSITION - CALL TELEMEDICINE PROVIDER NOW: HOW TO PROTECT OTHERS - WHEN YOU ARE SICK WITH COVID-19: * STAY HOME A MINIMUM OF 10 DAYS: Home isolation is needed for at least 10 days after the symptoms started. Stay home from school or work if you are sick. Do NOT  go to religious services, child care centers, shopping, or other public places. Do NOT use public transportation (e.g., bus, taxis,  ridesharing). Do NOT allow any visitors to your home. Leave the house only if you need to seek urgent medical care. * WASH HANDS OFTEN: Wash hands often with soap and water. After coughing or sneezing are important times. If soap and water are not available, use an alcohol-based hand sanitizer with at least 60% alcohol, covering all surfaces of your hands and rubbing them together until they feel dry. Avoid touching your eyes, nose, and mouth with unwashed hands. * WEAR A MASK: Wear a facemask when around others. Always wear a facemask (if available) if you have to leave your home (such as going to a medical facility). * Fever: For fever over 101 F (38.3 C), take acetaminophen every 4-6 hours (Adults 650 mg) OR ibuprofen every 6-8 hours (Adults 400 mg). Before taking any medicine, read all the instructions on the package. Do not take aspirin unless your doctor has prescribed it for you. * Muscle aches, headache, and other pains: Often this comes and goes with the fever. Take acetaminophen every 4-6 hours (Adults 650 mg) OR ibuprofen every 6-8 hours (Adults 400 mg). Before taking any medicine, read all the instructions on the package. CALL BACK IF: * You become worse. CARE ADVICE given per CORONAVIRUS (COVID-19) - DIAGNOSED OR SUSPECTED (Adult) guideline. After Care Instructions Given Call Event Type User Date / Time Description Education document email Doylene Canard, RN, Lesa 12/01/2019 12:49:41 PM [facility] Connect Now Instructions Referrals GO TO FACILITY UNDECIDED

## 2019-12-05 ENCOUNTER — Ambulatory Visit (HOSPITAL_COMMUNITY): Payer: Medicare Other | Attending: Pulmonary Disease

## 2019-12-10 ENCOUNTER — Ambulatory Visit (INDEPENDENT_AMBULATORY_CARE_PROVIDER_SITE_OTHER): Payer: Medicare Other | Admitting: Family Medicine

## 2019-12-10 ENCOUNTER — Other Ambulatory Visit: Payer: Self-pay

## 2019-12-10 DIAGNOSIS — U071 COVID-19: Secondary | ICD-10-CM

## 2019-12-10 DIAGNOSIS — E860 Dehydration: Secondary | ICD-10-CM | POA: Diagnosis not present

## 2019-12-10 NOTE — Progress Notes (Signed)
   Holly [name] is a 86 y.o. female who presents today for a virtual office visit.  Assessment/Plan:  Covid Respiratory symptoms are stable however patient appears to be dehydrated.  Recommended going to the emergency room for IV fluids however patient and her granddaughter would like to defer for the time being.  In light of best patient care, we will have the patient's granddaughter come to our office to pick up a bag of 1 L of lactated Ringer's.  Her granddaughter is a Marine scientist and is familiar with starting IVs.  If continues to have problems despite IV fluid resuscitation or unable to start IV fluid they will need to go to emergency room.  Patient and granddaughter voiced understanding.     Subjective:  HPI:  Patient was diagnosed with Covid a week and a half ago.  Her symptoms included cough, fever, and headache. Some family members have tested positive as well. Still having a lot of fatigue. Still having low grade temperature.  Patient's granddaughter is very concerned about her being dehydrated.  Her O2 sats have been in the 88-90 range.  She is still having some dry cough.  No shortness of breath. She is able to keep some fluids down.  She has had occasional loose stools.  Not able to eat much.  Heart rates have been in the 60s to 70s.       Objective/Observations  Physical Exam: Gen: NAD, resting comfortably Pulm: Normal work of breathing Neuro: Grossly normal, moves all extremities Psych: Normal affect and thought content  Virtual Visit via Video   I connected with Holly [name] on 12/10/19 at 11:40 AM EST by a video enabled telemedicine application and verified that I am speaking with the correct person using two identifiers. The limitations of evaluation and management by telemedicine and the availability of in person appointments were discussed. The patient expressed understanding and agreed to proceed.   Patient location: Home Provider location: [place] participating in the virtual visit: Myself and Patient     [name]. Jerline Pain, MD 12/10/2019 11:31 AM

## 2020-01-08 ENCOUNTER — Encounter: Payer: Self-pay | Admitting: Family Medicine

## 2020-01-08 ENCOUNTER — Ambulatory Visit (INDEPENDENT_AMBULATORY_CARE_PROVIDER_SITE_OTHER): Payer: Medicare Other | Admitting: Family Medicine

## 2020-01-08 ENCOUNTER — Other Ambulatory Visit: Payer: Self-pay

## 2020-01-08 VITALS — BP 118/66 | HR 73 | Temp 97.6°F | Ht 65.0 in | Wt 88.2 lb

## 2020-01-08 DIAGNOSIS — Z8616 Personal history of COVID-19: Secondary | ICD-10-CM | POA: Diagnosis not present

## 2020-01-08 DIAGNOSIS — J449 Chronic obstructive pulmonary disease, unspecified: Secondary | ICD-10-CM

## 2020-01-08 DIAGNOSIS — U071 COVID-19: Secondary | ICD-10-CM

## 2020-01-08 DIAGNOSIS — R634 Abnormal weight loss: Secondary | ICD-10-CM | POA: Diagnosis not present

## 2020-01-08 NOTE — Assessment & Plan Note (Signed)
She is down about 7 pounds since our last visit however was dealing with COVID-19.  Per patient she is actually up about 8 pounds from her low.  Encouraged continued good oral hydration and diet high in fat, fiber and protein.  Follow-up in 3 months.

## 2020-01-08 NOTE — Assessment & Plan Note (Signed)
Stable.  Continue Wixela.

## 2020-01-08 NOTE — Progress Notes (Signed)
   Holly [name] is a 86 y.o. female who presents today for an office visit.  Assessment/Plan:  New/Acute Problems: COVID 19 Seems to have recovered from acute illness.  Still dealing with some post viral fatigue.  Encouraged patient stay well-hydrated.  Discussed reasons to return to care.  Chronic Problems Addressed Today: Weight loss She is down about 7 pounds since our last visit however was dealing with COVID-19.  Per patient she is actually up about 8 pounds from her low.  Encouraged continued good oral hydration and diet high in fat, fiber and protein.  Follow-up in 3 months.  COPD mixed type ([place]) Stable.  Continue Wixela.     Subjective:  HPI:  Patient seen about a month ago for a virtual visit for Covid.  She has recovered from her acute illness.  She has gained about 8 pounds since she was ill.  She thinks she is doing well her appetite.  Breathing is normal.  Still has some residual fatigue.       Objective:  Physical Exam: BP 118/66   Pulse 73   Temp 97.6 F (36.4 C)   Ht 5\' 5"  (1.651 m)   Wt 88 lb 3.2 oz (40 kg)   SpO2 98%   BMI 14.68 kg/m   . Wt Readings from Last 3 Encounters:  01/08/20 88 lb 3.2 oz (40 kg)  12/01/19 95 lb (43.1 kg)  11/19/19 95 lb (43.1 kg)   Gen: No acute distress, resting comfortably CV: Regular rate and rhythm with no murmurs appreciated Pulm: Normal work of breathing, clear to auscultation bilaterally with no crackles, wheezes, or rhonchi Neuro: Grossly normal, moves all extremities Psych: Normal affect and thought content      Caleb M. Jerline Pain, MD 01/08/2020 1:52 PM

## 2020-01-08 NOTE — Patient Instructions (Signed)
It was very nice to see you today!  I am glad that you are feeling better!  Please continue to stay well hydrated.  I will see you back in 3 months, or sooner if needed.   Take care, Dr Jerline Pain  Please try these tips to maintain a healthy lifestyle:   Eat at least 3 REAL meals and 1-2 snacks per day.  Aim for no more than 5 hours between eating.  If you eat breakfast, please do so within one hour of getting up.    Each meal should contain half fruits/vegetables, one quarter protein, and one quarter carbs (no bigger than a computer mouse)   Cut down on sweet beverages. This includes juice, soda, and sweet tea.     Drink at least 1 glass of water with each meal and aim for at least 8 glasses per day   Exercise at least 150 minutes every week.

## 2020-02-12 ENCOUNTER — Other Ambulatory Visit: Payer: Self-pay

## 2020-02-12 ENCOUNTER — Encounter: Payer: Self-pay | Admitting: Adult Health

## 2020-02-12 ENCOUNTER — Ambulatory Visit (INDEPENDENT_AMBULATORY_CARE_PROVIDER_SITE_OTHER): Payer: Medicare Other | Admitting: Adult Health

## 2020-02-12 VITALS — BP 122/52 | HR 68 | Temp 97.2°F | Ht 62.0 in | Wt 95.0 lb

## 2020-02-12 DIAGNOSIS — E785 Hyperlipidemia, unspecified: Secondary | ICD-10-CM

## 2020-02-12 DIAGNOSIS — I671 Cerebral aneurysm, nonruptured: Secondary | ICD-10-CM

## 2020-02-12 DIAGNOSIS — I639 Cerebral infarction, unspecified: Secondary | ICD-10-CM | POA: Diagnosis not present

## 2020-02-12 NOTE — Progress Notes (Signed)
[facility] [address] [place]. [place] [address] [phone]       OFFICE FOLLOW-UP NOTE  [name] Date of Birth:  [birth year] Medical Record Number:  [id number]  [facility] provider: Dr. Pearlean Brownie    Chief complaint: Chief Complaint  Patient presents with  . Follow-up    Rm treatment, Grand daughter, Marylene Land  . Cerebrovascular Accident     HPI:   Holly [name] is a 86 year old female who is being seen today, 02/12/2020, for stroke follow-up accompanied by her granddaughter.  She has been stable from a stroke standpoint without residual deficits, new or reoccurring stroke/TIA symptoms.  After prior visit, patient discontinued aspirin due to recommendations by ophthalmologist due to history of macular degeneration and she was advised to restart aspirin for secondary stroke prevention after speaking with ophthalmologist but has not done so at this time.  Continues on atorvastatin with myalgias.  Blood pressure today initially elevated at 157/75 but on recheck 122/52.  Of note, she was Covid positive in January but recovered well without residual symptoms.  No concerns at this time.    History provided for reference purposes only Initial visit 10/03/2018 PS: Holly [name] is a 86 year old pleasant Caucasian lady seen today for initial office follow-up visit following hospital admission for stroke.  She is accompanied by her daughter Holly [name].  History is obtained from them and review of electronic medical records.  I personally reviewed imaging films.HPI ( Dr Laurence Slate ) ;Holly [name] is an 86 y.o. female  with PMH of CHF, CAD, hypthyorodism, HLD presents with transient episode of slurred speech and gait instability that occurred around 6 PM yesterday evening witnessed by her son.  Patient's son insisted she come to the hospital and evaluated today in the ER.  MRI brain shows small acute infarcts in the right parietal lobe and insula.  She was admitted for stroke work-up and neurology  was consulted.  Patient not on ASA due to advice from her ophthalmologist for macular degeneration?  She also states that she had some palpitations/quivering in her neck prior to this episode. Date last known well: 10.1.19 Time last known well: 6pm tPA Given: no, outside window NIHSS: 0 Baseline MRS 0  .  CT scan of the head showed diffuse atrophy and white matter changes no acute abnormality.  MRI scan of the brain showed 2 tiny right insular cortex and right parietal embolic infarcts.  MRI of the brain showed no large vessel stenosis but small 1 to 2 mm left anterior communicating artery aneurysm.  LDL cholesterol was 52 mg percent.  Hemoglobin A1c was 5.2.  Carotid ultrasound showed no extracranial stenosis.  Transthoracic echo showed normal ejection fraction and transesophageal echo showed no definite cardiac source of embolism.  Cardiology was consulted for loop recorder placement but given patient's history of palpitations the night prior to the stroke decided to do a 30-day external heart monitor instead.  Patient is still wearing a heart monitor and so far proximal A. fib has not yet been found.  She is states she is done well since discharge.  Her speech and balance and walking have improved.  She is on aspirin 81 mg daily which is tolerating well with only minor bruising.  She is still quite independent and does all activities of daily living by herself.  She has no new complaints today.  Her blood pressure is apparently well controlled today it is 117/68.  Update 08/01/2019 JM: [name] is a 86 year old female who  is being seen today for stroke follow-up.  She has been doing well from a stroke standpoint.  She continues to live with her son but is able to maintain ADLs independently.  She did undergo a 30-day cardiac event monitor which was unremarkable for atrial fibrillation.  Discussion with Dr. Ladona Ridgel, cardiology, in regards to placement of ILR but she declined placement.  She discontinued  aspirin 81 mg due to bruises after bumping her legs and reports she was told by her prior ophthalmologist that she should discontinue aspirin due to risk of bleeding in her eyes and history of macular degeneration.  Son has attempted to schedule follow-up visit with ophthalmology as prior visit canceled due to COVID-19 but he has not been able to schedule a follow-up visit at this time.  He is requesting switching ophthalmologist from [facility] to [facility].  She has continued on atorvastatin without myalgias.  Recent lipid panel on 07/06/2019 showed LDL 53.  Blood pressure today 147/70.  No further concerns at this time.     ROS:   14 system review of systems is positive for no complaints and all other systems negative   PMH:  Past Medical History:  Diagnosis Date  . CAD (coronary artery disease)   . CELLULITIS 06/18/2008  . CHF (congestive heart failure) (HCC)   . CHRONIC OBSTRUCTIVE PULMONARY DISEASE, ACUTE EXACERBATION 11/16/2007  . COPD 05/18/2007  . COVID-19   . GERD 12/25/2007  . HYPERLIPIDEMIA 05/18/2007  . HYPOTHYROIDISM 05/18/2007  . Macular degeneration   . Stroke (HCC)   . Urosepsis     Social History:  Social History   Socioeconomic History  . Marital status: Married    Spouse name: Not on file  . Number of children: Not on file  . Years of education: Not on file  . Highest education level: Not on file  Occupational History  . Not on file  Tobacco Use  . Smoking status: Former Smoker    Quit date: 11/29/1985    Years since quitting: 34.2  . Smokeless tobacco: Never Used  Substance and Sexual Activity  . Alcohol use: No  . Drug use: No  . Sexual activity: Not on file  Other Topics Concern  . Not on file  Social History Narrative  . Not on file   Social Determinants of Health   Financial Resource Strain:   . Difficulty of Paying Living Expenses:   Food Insecurity:   . Worried About Programme researcher, broadcasting/film/video in the Last Year:   . Barista in the  Last Year:   Transportation Needs:   . Freight forwarder (Medical):   Marland Kitchen Lack of Transportation (Non-Medical):   Physical Activity:   . Days of Exercise per Week:   . Minutes of Exercise per Session:   Stress:   . Feeling of Stress :   Social Connections:   . Frequency of Communication with Friends and Family:   . Frequency of Social Gatherings with Friends and Family:   . Attends Religious Services:   . Active Member of Clubs or Organizations:   . Attends Banker Meetings:   Marland Kitchen Marital Status:   Intimate Partner Violence:   . Fear of Current or Ex-Partner:   . Emotionally Abused:   Marland Kitchen Physically Abused:   . Sexually Abused:     Medications:   Current Outpatient Medications on File Prior to Visit  Medication Sig Dispense Refill  . albuterol (PROVENTIL HFA) 108 (90  Base) MCG/ACT inhaler Inhale 1-2 puffs into the lungs every 6 (six) hours as needed for wheezing or shortness of breath. 20.1 g 5  . atorvastatin (LIPITOR) 40 MG tablet TAKE 1 TABLET BY MOUTH EVERY DAY 90 tablet 3  . Fluticasone-Salmeterol (WIXELA INHUB) 500-50 MCG/DOSE AEPB Inhale 1 puff into the lungs 2 (two) times daily.    Marland Kitchen ipratropium (ATROVENT) 0.06 % nasal spray PLACE 2 SPRAYS INTO BOTH NOSTRILS 4 TIMES DAILY. (Patient taking differently: PRN) 15 mL 0  . levothyroxine (SYNTHROID) 100 MCG tablet Take 1 tablet (100 mcg total) by mouth daily. 90 tablet 1  . Multiple Vitamins-Minerals (ICAPS PO) Take 1 capsule by mouth 2 (two) times daily.     No current facility-administered medications on file prior to visit.    Allergies:   Allergies  Allergen Reactions  . Levofloxacin     REACTION: unspecified  . Macrobid [Nitrofurantoin Macrocrystal] Nausea Only  . Sulfamethoxazole     REACTION: unspecified  . Tramadol Other (See Comments)    Shakes   . Amoxicillin Rash    Has patient had a PCN reaction causing immediate rash, facial/tongue/throat swelling, SOB or lightheadedness with hypotension:  No Has patient had a PCN reaction causing severe rash involving mucus membranes or skin necrosis: No Has patient had a PCN reaction that required hospitalization: No Has patient had a PCN reaction occurring within the last 10 years: No If all of the above answers are "NO", then may proceed with Cephalosporin use.   Made "bottom area" very raw    Today's Vitals   02/12/20 1052 02/12/20 1131  BP: (!) 157/75 (!) 122/52  Pulse: 68   Temp: (!) 97.2 F (36.2 C)   Weight: 95 lb (43.1 kg)   Height: 5\' 2"  (1.575 m)    Body mass index is 17.38 kg/m.   Physical Exam General: Frail elderly Caucasian lady seated, in no evident distress Head: head normocephalic and atraumatic.  Neck: supple with no carotid or supraclavicular bruits Cardiovascular: regular rate and rhythm, no murmurs Musculoskeletal: no deformity Skin:  no rash/petichiae Vascular:  Normal pulses all extremities  Neurologic Exam Mental Status: Awake and fully alert.  Normal speech and language.  Oriented to place and time. Recent memory impaired and remote memory intact. Attention span, concentration and fund of knowledge mostly appropriate with granddaughter needing to provide some information. Mood and affect appropriate.  Cranial Nerves: Pupils equal, briskly reactive to light. Extraocular movements full without nystagmus. Visual fields full to confrontation. Hearing intact. Facial sensation intact.  Face, tongue, palate moves normally and symmetrically.  Motor: Normal bulk and tone. Normal strength in all tested extremity muscles except mild bilateral hip flexor weakness.  Sensory.: intact to touch ,pinprick .position and vibratory sensation.  Coordination: Rapid alternating movements normal in all extremities. Finger-to-nose and heel-to-shin performed accurately bilaterally. Gait and Station: Arises from chair without difficulty. Stance is normal. Gait demonstrates normal stride length and balance without need of assistive  device Reflexes: 1+ and symmetric. Toes downgoing.     ASSESSMENT: 86 year old Caucasian lady with right MCA embolic infarction October 2019 of cryptogenic etiology.  30-day cardiac event monitor negative for atrial fibrillation.  Patient declined ILR placement.  Vascular risk factors of age and hyperlipidemia only.  Incidental finding of 1 to 2 mm left A-comm aneurysm.  She has been stable from a stroke standpoint without residual deficits or new/recurring stroke/TIA symptoms     PLAN: -Advised to restart aspirin 81 mg daily for secondary stroke prevention as  benefit greatly outweighs possible risk.  Continue atorvastatin for HLD and secondary stroke prevention. -Surveillance monitoring with MRA head for known left A-comm aneurysm -family history of aortic aneurysm but no known family history of intracranial aneurysm.  Discussion regarding importance of managing blood pressure, avoidance of alcohol and tobacco use, avoidance of stimulant medications and avoidance of straining or Valsalva maneuvers.  If imaging stable, will repeat in 1 year -Continue to follow with PCP for HLD management -Continue to monitor blood pressure at home -Continue to stay active and maintain a healthy diet -maintain strict control of hypertension with blood pressure goal below 130/90, diabetes with hemoglobin A1c goal below 6.5% and lipids with LDL cholesterol goal below 70 mg/dL. I also advised the patient to eat a healthy diet with plenty of whole grains, cereals, fruits and vegetables, exercise regularly and maintain ideal body weight .   Follow-up in 1 year or call earlier if needed   I spent 32 minutes of face-to-face and non-face-to-face time with patient.  This included previsit chart review, lab review, study review, order entry, electronic health record documentation, patient education   Ihor Austin, [facility]  [facility] [address] [place], [place]  [address]  Phone [phone] Fax [phone] Note: This document was prepared with digital dictation and possible smart phrase technology. Any transcriptional errors that result from this process are unintentional.

## 2020-02-12 NOTE — Patient Instructions (Signed)
restart aspirin 81 mg daily  And continue lipitor  for secondary stroke prevention  Continue to follow up with PCP regarding cholesterol and blood pressure management   Will check MRA of head for surveillance monitoring of left sided aneurysm    Continue to monitor blood pressure at home  Maintain strict control of hypertension with blood pressure goal below 130/90, diabetes with hemoglobin A1c goal below 6.5% and cholesterol with LDL cholesterol (bad cholesterol) goal below 70 mg/dL. I also advised the patient to eat a healthy diet with plenty of whole grains, cereals, fruits and vegetables, exercise regularly and maintain ideal body weight.  Followup in the future with me in 1 year or call earlier if needed       Thank you for coming to see [place] at [facility]. I hope we have been able to provide you high quality care today.  You may receive a patient satisfaction survey over the next few weeks. We would appreciate your feedback and comments so that we may continue to improve ourselves and the health of our patients.

## 2020-02-20 ENCOUNTER — Telehealth: Payer: Self-pay | Admitting: Adult Health

## 2020-02-20 NOTE — Telephone Encounter (Signed)
Medicare/bcbs auth: NPR Ref # [id number] order sent to GI they will reach out to the patient to schedule. . Because her plan does not participate with AIM spoke with Jayme Cloud

## 2020-02-29 NOTE — Progress Notes (Signed)
I agree with the above plan 

## 2020-03-05 ENCOUNTER — Ambulatory Visit
Admission: RE | Admit: 2020-03-05 | Discharge: 2020-03-05 | Disposition: A | Discharge status: Home / Self Care | Payer: Medicare Other | Source: Ambulatory Visit | Attending: Adult Health | Admitting: Adult Health

## 2020-03-05 ENCOUNTER — Other Ambulatory Visit: Payer: Self-pay

## 2020-03-05 DIAGNOSIS — I671 Cerebral aneurysm, nonruptured: Secondary | ICD-10-CM

## 2020-03-10 ENCOUNTER — Telehealth: Payer: Self-pay | Admitting: Adult Health

## 2020-03-10 ENCOUNTER — Telehealth: Payer: Self-pay | Admitting: *Deleted

## 2020-03-10 NOTE — Telephone Encounter (Signed)
Rollene Rotunda, Angel(on DPR) has called asking for the results of the MRI as soon as they are available

## 2020-03-10 NOTE — Telephone Encounter (Signed)
I called and relayed to both Cecilie Lowers, and Emilio Math, the result note per listed below from JM/NP.  He and she verbalized understanding.

## 2020-03-10 NOTE — Telephone Encounter (Signed)
-----   Message from Frann Rider, NP sent at 03/10/2020  3:55 PM EDT ----- Please advise patient that recent imaging stable compared to prior imaging and less likely evidence of aneurysm but more so a type of outpouching that is at times mistaken for aneurysm.  There is a low risk of this causing any significant health concerns in the future and typically does not need any type of additional follow-up.

## 2020-03-18 DIAGNOSIS — H353233 Exudative age-related macular degeneration, bilateral, with inactive scar: Secondary | ICD-10-CM | POA: Diagnosis not present

## 2020-03-18 DIAGNOSIS — H43813 Vitreous degeneration, bilateral: Secondary | ICD-10-CM | POA: Diagnosis not present

## 2020-03-19 NOTE — Progress Notes (Signed)
Kindly inform the patient that MR angiogram study of the brain does not show definite aneurysms  except there are minor outpouchings at the origin of a couple of tiny vessels in the brain. Nothing to worry about and no need for further follow-up brain vascular imaging studies

## 2020-03-20 ENCOUNTER — Other Ambulatory Visit: Payer: Self-pay | Admitting: Family Medicine

## 2020-04-07 ENCOUNTER — Other Ambulatory Visit: Payer: Self-pay

## 2020-04-07 ENCOUNTER — Encounter: Payer: Self-pay | Admitting: Family Medicine

## 2020-04-07 ENCOUNTER — Ambulatory Visit (INDEPENDENT_AMBULATORY_CARE_PROVIDER_SITE_OTHER): Payer: Medicare Other | Admitting: Family Medicine

## 2020-04-07 VITALS — BP 128/66 | HR 72 | Temp 97.0°F | Ht 62.0 in | Wt 92.2 lb

## 2020-04-07 DIAGNOSIS — I6359 Cerebral infarction due to unspecified occlusion or stenosis of other cerebral artery: Secondary | ICD-10-CM

## 2020-04-07 DIAGNOSIS — E038 Other specified hypothyroidism: Secondary | ICD-10-CM

## 2020-04-07 DIAGNOSIS — R634 Abnormal weight loss: Secondary | ICD-10-CM

## 2020-04-07 LAB — CBC
HCT: 37.4 % (ref 36.0–46.0)
Hemoglobin: 12.6 g/dL (ref 12.0–15.0)
MCHC: 33.6 g/dL (ref 30.0–36.0)
MCV: 95.6 fl (ref 78.0–100.0)
Platelets: 207 10*3/uL (ref 150.0–400.0)
RBC: 3.92 Mil/uL (ref 3.87–5.11)
RDW: 14 % (ref 11.5–15.5)
WBC: 5.9 10*3/uL (ref 4.0–10.5)

## 2020-04-07 LAB — COMPREHENSIVE METABOLIC PANEL
ALT: 14 U/L (ref 0–35)
AST: 27 U/L (ref 0–37)
Albumin: 3.9 g/dL (ref 3.5–5.2)
Alkaline Phosphatase: 80 U/L (ref 39–117)
BUN: 23 mg/dL (ref 6–23)
CO2: 29 mEq/L (ref 19–32)
Calcium: 9.3 mg/dL (ref 8.4–10.5)
Chloride: 103 mEq/L (ref 96–112)
Creatinine, Ser: 0.96 mg/dL (ref 0.40–1.20)
GFR: 54.57 mL/min — ABNORMAL LOW (ref >60.00)
Glucose, Bld: 104 mg/dL — ABNORMAL HIGH (ref 70–99)
Potassium: 5 mEq/L (ref 3.5–5.1)
Sodium: 139 mEq/L (ref 135–145)
Total Bilirubin: 0.8 mg/dL (ref 0.2–1.2)
Total Protein: 6.8 g/dL (ref 6.0–8.3)

## 2020-04-07 LAB — TSH: TSH: 1.96 u[IU]/mL (ref 0.35–4.50)

## 2020-04-07 NOTE — Assessment & Plan Note (Signed)
Check TSH.  Continue Synthroid 100 mcg daily. 

## 2020-04-07 NOTE — Progress Notes (Signed)
   Holly [name] is a 86 y.o. female who presents today for an office visit.  Assessment/Plan:  Chronic Problems Addressed Today: Weight loss Up about 4 pound since her last visit here, though down 3 pounds overall since March. She recently suffered a GI illness which could have contributed to some recent weight loss.  Discussed importance of diet high in protein and fat.  She does not wish to start any other medication at this point.  She will continue working on getting good nutrition.  Follow-up in 3 to 6 months.  We will check CBC, C met, TSH.  CVA (cerebral vascular accident) ([place]) Recommend restarting aspirin 81 mg daily per neurology recommendations.  Hypothyroidism Check TSH.  Continue Synthroid 100 mcg daily.     Subjective:  HPI:  See A/p.         Objective:  Physical Exam: BP 128/66   Pulse 72   Temp (!) 97 F (36.1 C)   Ht '5\' 2"'$  (1.575 m)   Wt 92 lb 3.2 oz (41.8 kg)   SpO2 95%   BMI 16.86 kg/m   Wt Readings from Last 3 Encounters:  04/07/20 92 lb 3.2 oz (41.8 kg)  02/12/20 95 lb (43.1 kg)  01/08/20 88 lb 3.2 oz (40 kg)  Gen: No acute distress, resting comfortably CV: Regular rate and rhythm with no murmurs appreciated Psych: Normal affect and thought content      Holly [name] M. Jerline Pain, MD 04/07/2020 2:00 PM

## 2020-04-07 NOTE — Patient Instructions (Signed)
It was very nice to see you today!  We will check blood work today.  Please make sure that you are getting plenty of protein and fat in your diet.  Come back in 3 to 6 months, or sooner if needed.  Take care, Dr Jerline Pain  Please try these tips to maintain a healthy lifestyle:   Eat at least 3 REAL meals and 1-2 snacks per day.  Aim for no more than 5 hours between eating.  If you eat breakfast, please do so within one hour of getting up.    Each meal should contain half fruits/vegetables, one quarter protein, and one quarter carbs (no bigger than a computer mouse)   Cut down on sweet beverages. This includes juice, soda, and sweet tea.     Drink at least 1 glass of water with each meal and aim for at least 8 glasses per day   Exercise at least 150 minutes every week.

## 2020-04-07 NOTE — Assessment & Plan Note (Signed)
Recommend restarting aspirin 81 mg daily per neurology recommendations.

## 2020-04-07 NOTE — Assessment & Plan Note (Signed)
Up about 4 pound since her last visit here, though down 3 pounds overall since March. She recently suffered a GI illness which could have contributed to some recent weight loss.  Discussed importance of diet high in protein and fat.  She does not wish to start any other medication at this point.  She will continue working on getting good nutrition.  Follow-up in 3 to 6 months.  We will check CBC, C met, TSH.

## 2020-04-08 NOTE — Progress Notes (Signed)
Please inform patient of the following:  Blood work is all STABLE. Do not need to make any changes to her treatment plan at this time. We will see her back for follow up in a few months.   Holly [name]. Jerline Pain, MD 04/08/2020 3:40 PM

## 2020-04-19 ENCOUNTER — Emergency Department (HOSPITAL_COMMUNITY)
Admission: EM | Admit: 2020-04-19 | Discharge: 2020-04-20 | Disposition: A | Discharge status: Home / Self Care | Payer: Medicare Other | Attending: Emergency Medicine | Admitting: Emergency Medicine

## 2020-04-19 ENCOUNTER — Encounter (HOSPITAL_COMMUNITY): Payer: Self-pay

## 2020-04-19 DIAGNOSIS — Y9389 Activity, other specified: Secondary | ICD-10-CM | POA: Diagnosis not present

## 2020-04-19 DIAGNOSIS — M810 Age-related osteoporosis without current pathological fracture: Secondary | ICD-10-CM | POA: Diagnosis not present

## 2020-04-19 DIAGNOSIS — E039 Hypothyroidism, unspecified: Secondary | ICD-10-CM | POA: Insufficient documentation

## 2020-04-19 DIAGNOSIS — Z87891 Personal history of nicotine dependence: Secondary | ICD-10-CM | POA: Insufficient documentation

## 2020-04-19 DIAGNOSIS — X500XXA Overexertion from strenuous movement or load, initial encounter: Secondary | ICD-10-CM | POA: Diagnosis not present

## 2020-04-19 DIAGNOSIS — Y929 Unspecified place or not applicable: Secondary | ICD-10-CM | POA: Insufficient documentation

## 2020-04-19 DIAGNOSIS — Z8673 Personal history of transient ischemic attack (TIA), and cerebral infarction without residual deficits: Secondary | ICD-10-CM | POA: Insufficient documentation

## 2020-04-19 DIAGNOSIS — I509 Heart failure, unspecified: Secondary | ICD-10-CM | POA: Diagnosis not present

## 2020-04-19 DIAGNOSIS — I110 Hypertensive heart disease with heart failure: Secondary | ICD-10-CM | POA: Insufficient documentation

## 2020-04-19 DIAGNOSIS — T07XXXA Unspecified multiple injuries, initial encounter: Secondary | ICD-10-CM | POA: Diagnosis not present

## 2020-04-19 DIAGNOSIS — Z79899 Other long term (current) drug therapy: Secondary | ICD-10-CM | POA: Insufficient documentation

## 2020-04-19 DIAGNOSIS — I2510 Atherosclerotic heart disease of native coronary artery without angina pectoris: Secondary | ICD-10-CM | POA: Diagnosis not present

## 2020-04-19 DIAGNOSIS — Y999 Unspecified external cause status: Secondary | ICD-10-CM | POA: Diagnosis not present

## 2020-04-19 DIAGNOSIS — R52 Pain, unspecified: Secondary | ICD-10-CM | POA: Diagnosis not present

## 2020-04-19 DIAGNOSIS — S3992XA Unspecified injury of lower back, initial encounter: Secondary | ICD-10-CM | POA: Diagnosis present

## 2020-04-19 DIAGNOSIS — M545 Low back pain: Secondary | ICD-10-CM | POA: Diagnosis not present

## 2020-04-19 DIAGNOSIS — M48061 Spinal stenosis, lumbar region without neurogenic claudication: Secondary | ICD-10-CM | POA: Diagnosis not present

## 2020-04-19 DIAGNOSIS — S22080A Wedge compression fracture of T11-T12 vertebra, initial encounter for closed fracture: Secondary | ICD-10-CM | POA: Insufficient documentation

## 2020-04-19 NOTE — ED Triage Notes (Signed)
Pt comes via [facility] for back pain, reports she was picking up a flower pot and heard a pop. Pt ambulatory.

## 2020-04-20 ENCOUNTER — Emergency Department (HOSPITAL_COMMUNITY): Payer: Medicare Other

## 2020-04-20 ENCOUNTER — Encounter (HOSPITAL_COMMUNITY): Payer: Self-pay | Admitting: Emergency Medicine

## 2020-04-20 DIAGNOSIS — S22080A Wedge compression fracture of T11-T12 vertebra, initial encounter for closed fracture: Secondary | ICD-10-CM | POA: Diagnosis not present

## 2020-04-20 DIAGNOSIS — M545 Low back pain: Secondary | ICD-10-CM | POA: Diagnosis not present

## 2020-04-20 DIAGNOSIS — M48061 Spinal stenosis, lumbar region without neurogenic claudication: Secondary | ICD-10-CM | POA: Diagnosis not present

## 2020-04-20 MED ORDER — LIDOCAINE 5 % EX PTCH: 1.0000 | MEDICATED_PATCH | CUTANEOUS | 0 refills | Status: DC

## 2020-04-20 MED ORDER — LIDOCAINE 5 % EX PTCH
1.0000 | MEDICATED_PATCH | CUTANEOUS | Status: DC
  Administered 2020-04-20: 1 via TRANSDERMAL
  Filled 2020-04-20: qty 1

## 2020-04-20 MED ORDER — ACETAMINOPHEN 500 MG PO TABS
1000.0000 mg | ORAL_TABLET | Freq: Once | ORAL | Status: AC
  Administered 2020-04-20: 1000 mg via ORAL
  Filled 2020-04-20: qty 2

## 2020-04-20 NOTE — ED Notes (Signed)
Pt stated she should be able to tolerate MRI but this RN advised the pt that if she couldn't, to let this RN and/or MRI staff aware.

## 2020-04-20 NOTE — Discharge Instructions (Addendum)
Wear back brace as applied until you are followed up by neurosurgery.  You should call on Monday to make an appointment with Dr. Christella Noa.  His contact information has been provided in this discharge summary for you to call and make these arrangements.

## 2020-04-20 NOTE — ED Notes (Signed)
Pt returned from MRI at this time

## 2020-04-20 NOTE — ED Provider Notes (Addendum)
[facility] EMERGENCY DEPARTMENT Provider Note   CSN: [id number] Arrival date & time: 04/19/20  2141     History Chief Complaint  Patient presents with  . Back Pain    Holly [name] is a 86 y.o. female.  The history is provided by the patient.  Back Pain Location:  Sacro-iliac joint Quality:  Aching Radiates to:  Does not radiate Pain severity:  Moderate Pain is:  Same all the time Onset quality:  Sudden Duration:  12 hours Timing:  Constant Progression:  Unchanged Chronicity:  New Context comment:  Lifting a flower pot  Relieved by:  Nothing Worsened by:  Nothing Ineffective treatments:  None tried Associated symptoms: no abdominal pain, no abdominal swelling, no bladder incontinence, no bowel incontinence, no chest pain, no dysuria, no fever, no headaches, no leg pain, no numbness, no paresthesias, no pelvic pain, no perianal numbness, no tingling, no weakness and no weight loss   Risk factors: no hx of cancer        Past Medical History:  Diagnosis Date  . CAD (coronary artery disease)   . CELLULITIS 06/18/2008  . CHF (congestive heart failure) ([place])   . CHRONIC OBSTRUCTIVE PULMONARY DISEASE, ACUTE EXACERBATION 11/16/2007  . COPD 05/18/2007  . COVID-19   . GERD 12/25/2007  . HYPERLIPIDEMIA 05/18/2007  . HYPOTHYROIDISM 05/18/2007  . Macular degeneration   . Stroke ([place])   . Urosepsis     Patient Active Problem List   Diagnosis Date Noted  . Weight loss 11/19/2019  . Mild cognitive impairment 07/06/2019  . Leg edema 11/13/2018  . Senile purpura ([place]) 09/07/2018  . Macular degeneration 09/05/2018  . Palpitations 09/05/2018  . CVA (cerebral vascular accident) ([place]) 08/30/2018  . GERD 12/25/2007  . Hypothyroidism 05/18/2007  . Dyslipidemia 05/18/2007  . COPD mixed type ([place]) 05/18/2007    Past Surgical History:  Procedure Laterality Date  . CATARACT EXTRACTION    . DILATION AND CURETTAGE OF UTERUS    . TEE WITHOUT CARDIOVERSION N/A  09/01/2018   Procedure: TRANSESOPHAGEAL ECHOCARDIOGRAM (TEE);  Surgeon: Buford Dresser, MD;  Location: [facility] ENDOSCOPY;  Service: Cardiovascular;  Laterality: N/A;  . VARICOSE VEIN SURGERY       OB History   No obstetric history on file.     Family History  Problem Relation Age of Onset  . Ovarian cancer Mother   . Diabetes Mellitus II Brother     Social History   Tobacco Use  . Smoking status: Former Smoker    Quit date: 11/29/1985    Years since quitting: 34.4  . Smokeless tobacco: Never Used  Substance Use Topics  . Alcohol use: No  . Drug use: No    Home Medications Prior to Admission medications   Medication Sig Start Date End Date Taking? Authorizing Provider  albuterol (PROVENTIL HFA) 108 (90 Base) MCG/ACT inhaler Inhale 1-2 puffs into the lungs every 6 (six) hours as needed for wheezing or shortness of breath. 07/06/19   Vivi Barrack, MD  atorvastatin (LIPITOR) 40 MG tablet TAKE 1 TABLET BY MOUTH EVERY DAY 11/13/19   Vivi Barrack, MD  Fluticasone-Salmeterol [facility] INHUB) 500-50 MCG/DOSE AEPB Inhale 1 puff into the lungs 2 (two) times daily.    [provider]  ipratropium (ATROVENT) 0.06 % nasal spray PLACE 2 SPRAYS INTO BOTH NOSTRILS 4 TIMES DAILY. Patient taking differently: PRN 09/29/18   Vivi Barrack, MD  levothyroxine (SYNTHROID) 100 MCG tablet TAKE 1 TABLET BY MOUTH EVERY DAY 03/20/20  Vivi Barrack, MD  Multiple Vitamins-Minerals (ICAPS PO) Take 1 capsule by mouth 2 (two) times daily.    [provider]    Allergies    Levofloxacin, Macrobid [nitrofurantoin macrocrystal], Sulfamethoxazole, Tramadol, and Amoxicillin  Review of Systems   Review of Systems  Constitutional: Negative for fever and weight loss.  HENT: Negative for congestion.   Eyes: Negative for visual disturbance.  Respiratory: Negative for shortness of breath.   Cardiovascular: Negative for chest pain.  Gastrointestinal: Negative for abdominal pain and bowel  incontinence.  Genitourinary: Negative for bladder incontinence, dysuria and pelvic pain.  Musculoskeletal: Positive for back pain.  Neurological: Negative for dizziness, tingling, tremors, seizures, syncope, facial asymmetry, speech difficulty, weakness, light-headedness, numbness, headaches and paresthesias.  All other systems reviewed and are negative.   Physical Exam Updated Vital Signs BP (!) 153/72   Pulse 65   Temp 98.8 F (37.1 C) (Oral)   Resp 14   SpO2 97%   Physical Exam Vitals and nursing note reviewed.  Constitutional:      Appearance: Normal appearance. She is not ill-appearing.  HENT:     Head: Normocephalic and atraumatic.     Nose: Nose normal.  Eyes:     Pupils: Pupils are equal, round, and reactive to light.  Cardiovascular:     Rate and Rhythm: Normal rate and regular rhythm.     Pulses: Normal pulses.     Heart sounds: Normal heart sounds.  Pulmonary:     Effort: Pulmonary effort is normal.     Breath sounds: Normal breath sounds.  Abdominal:     General: Abdomen is flat. Bowel sounds are normal.     Tenderness: There is no abdominal tenderness. There is no guarding.  Musculoskeletal:        General: Normal range of motion.     Cervical back: Normal range of motion and neck supple.  Skin:    General: Skin is warm and dry.     Capillary Refill: Capillary refill takes less than 2 seconds.  Neurological:     General: No focal deficit present.     Mental Status: She is alert and oriented to person, place, and time.     Deep Tendon Reflexes: Reflexes normal.  Psychiatric:        Mood and Affect: Mood normal.        Behavior: Behavior normal.     ED Results / Procedures / Treatments   Labs (all labs ordered are listed, but only abnormal results are displayed) Labs Reviewed - No data to display   Radiology DG Lumbar Spine Complete  Result Date: 04/20/2020 CLINICAL DATA:  Back pain EXAM: LUMBAR SPINE - COMPLETE 4+ VIEW COMPARISON:  None.  FINDINGS: There is diffuse osteopenia which limits detection of nondisplaced fractures. There is some mild age-indeterminate height loss of the L5 vertebral body. There is age-indeterminate height loss of the T11 and T10 vertebral bodies. Multilevel disc height loss is noted throughout the lumbar spine with multilevel facet arthrosis. Vascular calcifications are noted of the abdominal aorta. There is a degenerative levoscoliosis centered in the mid lumbar spine. IMPRESSION: 1. Detection of fractures is severely limited by diffuse osteopenia. 2. There is age-indeterminate height loss of the T10, T11, and likely the L5 vertebral body. 3.  Aortic Atherosclerosis (ICD10-I70.0). Electronically Signed   By: Constance Holster M.D.   On: 04/20/2020 02:36    Procedures Procedures (including critical care time)  Medications Ordered in ED Medications  lidocaine (LIDODERM) 5 %  1 patch (1 patch Transdermal Patch Applied 04/20/20 0042)  acetaminophen (TYLENOL) tablet 1,000 mg (1,000 mg Oral Given 04/20/20 0042)    ED Course  I have reviewed the triage vital signs and the nursing notes.  Pertinent labs & imaging results that were available during my care of the patient were reviewed by me and considered in my medical decision making (see chart for details).   706 case d/w Dr. Cyndy Freeze, please fit for TLSO and have patient follow up as an outpatient.    DORAN DEC was evaluated in Emergency Department on 04/20/2020 for the symptoms described in the history of present illness. She was evaluated in the context of the global COVID-19 pandemic, which necessitated consideration that the patient might be at risk for infection with the SARS-CoV-2 virus that causes COVID-19. Institutional protocols and algorithms that pertain to the evaluation of patients at risk for COVID-19 are in a state of rapid change based on information released by regulatory bodies including the CDC and federal and state organizations. These  policies and algorithms were followed during the patient's care in the ED.  Final Clinical Impression(s) / ED Diagnoses Signed out to Dr. Stark Jock pending TLSO    Warrene Kapfer, MD 04/20/20 [phone]

## 2020-04-20 NOTE — ED Notes (Signed)
Pt. Transported to X RAY

## 2020-04-20 NOTE — ED Notes (Signed)
Pt transported to MRI at this time 

## 2020-04-20 NOTE — Progress Notes (Signed)
Orthopedic Tech Progress Note Patient Details:  Holly [name] 04-30-1928 [id number] Order called in to [facility] Patient ID: [name], female   DOB: [birth year], 86 y.o.   MRN: [id number]   Holly [name] 04/20/2020, 8:14 AM

## 2020-04-20 NOTE — ED Notes (Signed)
Pt returned from XRAY 

## 2020-04-21 ENCOUNTER — Telehealth: Payer: Self-pay | Admitting: Family Medicine

## 2020-04-21 MED ORDER — ACETAMINOPHEN-CODEINE #3 300-30 MG PO TABS: 1.0000 | ORAL_TABLET | Freq: Three times a day (TID) | ORAL | 0 refills | Status: DC | PRN

## 2020-04-21 NOTE — Telephone Encounter (Signed)
I can send in tylenol with codiene instead - please make sure they are ok with that.  Holly [name]. Jerline Pain, MD 04/21/2020 2:53 PM

## 2020-04-21 NOTE — Telephone Encounter (Signed)
Daughter called back to check status.  I have informed her of Dr.Parker's response. She states the patient can not take tramadol, It gives her the shakes. &Would like something else called in.

## 2020-04-21 NOTE — Telephone Encounter (Signed)
I am ok with sending in tramadol - please ask the family if they would like for me to send this in.  [name]. Jerline Pain, MD 04/21/2020 11:53 AM

## 2020-04-21 NOTE — Telephone Encounter (Signed)
Ok per grand-daughter to send Tylenol with codeine

## 2020-04-21 NOTE — Telephone Encounter (Signed)
Pt daughter stated Patient can not take Tramadol.

## 2020-04-21 NOTE — Telephone Encounter (Signed)
Patient was seen in the ED for Compression Fracture and she is still in a lot of pain. Patient wanted to know if Dr. Jerline Pain could send something in for the pain.

## 2020-04-21 NOTE — Telephone Encounter (Signed)
Please advise 

## 2020-04-22 NOTE — Telephone Encounter (Signed)
Called daughter and scheduled patient for 05/02/20 @ 2:20 pm but she would like to know if Dr.Parker needed to see them before they see the neurologist on 6/1

## 2020-04-22 NOTE — Telephone Encounter (Signed)
Please contact patient for a f/u with Dr. Jerline Pain.

## 2020-04-22 NOTE — Telephone Encounter (Signed)
Patient's granddaughter notified

## 2020-04-22 NOTE — Telephone Encounter (Signed)
Do not need to see them unless her pain is not improving.  [name]. Jerline Pain, MD 04/22/2020 2:16 PM

## 2020-04-22 NOTE — Telephone Encounter (Signed)
Please advise 

## 2020-04-23 NOTE — Telephone Encounter (Signed)
Please advise 

## 2020-04-23 NOTE — Telephone Encounter (Signed)
Patients daughter is calling back and see if she could either get more for over the weekend, or if there is something else she could take for the pain that will last longer.  Romie Jumper (daughter) [phone]

## 2020-04-24 ENCOUNTER — Telehealth: Payer: Medicare Other | Admitting: Family Medicine

## 2020-04-24 ENCOUNTER — Encounter: Payer: Self-pay | Admitting: Family Medicine

## 2020-04-24 DIAGNOSIS — S129XXA Fracture of neck, unspecified, initial encounter: Secondary | ICD-10-CM

## 2020-04-24 DIAGNOSIS — M542 Cervicalgia: Secondary | ICD-10-CM

## 2020-04-24 NOTE — Progress Notes (Signed)
Virtual Visit via Telephone Note  I connected with Holly [name] on 04/24/20 at 12:20 PM EDT by telephone and verified that I am speaking with the correct person using two identifiers.   I discussed the limitations, risks, security and privacy concerns of performing an evaluation and management service by telephone and the availability of in person appointments. I also discussed with the patient that there may be a patient responsible charge related to this service. The patient expressed understanding and agreed to proceed.  Location patient: home Location provider: work or home office Participants present for the call: patient, provider, granddaughter Levada Dy Patient did not have a visit in the prior 7 days to address this/these issue(s).   History of Present Illness:  They requested a phone visit for pain related to a compression fracture. Our entire computer system was down at the time of her appointment and I had no acces to her chart at the time. The grandaughter explained that she had been diagnosed with a compression fracture last Friday after an extensive evaluation with an MRI in the Emergency Room. She is scheduled to see [facility] next week. She has tried lidocaine patches, tylenol and then Tylenol 3 from her PCP but is still in severe pain and they report the Tylenol 3 is not working.    Observations/Objective: Patient sounds cheerful and well on the phone. I do not appreciate any SOB. Speech and thought processing are grossly intact. Patient reported vitals:  Assessment and Plan:  Neck pain  Compression fracture of cervical vertebra, unspecified cervical vertebral level, initial encounter [facility])  Discussed various treatment options, however explained that I can not Rx controlled medications via a telephone visit per national standards of care, even more so without access to her chart. I did apologize and will not charge for this visit. I suggested could seek in person care or could  contact Dr. Malachy Moan office and perhaps they could assist on the next best step - inperson care visit vs them providing pain management vs if she needs admission for pain control. Grandaughter offered to call [facility] and agreed to call back her PCP office if an in person visit or further assistance is needed.   Follow Up Instructions:   I did not refer this patient for an OV in the next 24 hours for this/these issue(s).  I discussed the assessment and treatment plan with the patient. The patient was provided an opportunity to ask questions and all were answered. The patient agreed with the plan and demonstrated an understanding of the instructions.   The patient was advised to call back or seek an in-person evaluation if the symptoms worsen or if the condition fails to improve as anticipated.  I provided 12 minutes of non-face-to-face time during this encounter.   Lucretia Kern, DO

## 2020-04-25 MED ORDER — ACETAMINOPHEN-CODEINE #3 300-30 MG PO TABS: 1.0000 | ORAL_TABLET | Freq: Four times a day (QID) | ORAL | 0 refills | Status: AC | PRN

## 2020-04-25 NOTE — Addendum Note (Signed)
Addended by: Vivi Barrack on: 04/25/2020 03:07 PM   Modules accepted: Orders

## 2020-04-25 NOTE — Telephone Encounter (Signed)
Spoke to pt's Granddaughter told her Dr. Jerline Pain just sent a Rx to the pharmacy for Holly [name]. She said she will let them know.

## 2020-04-29 ENCOUNTER — Other Ambulatory Visit: Payer: Self-pay | Admitting: Neurosurgery

## 2020-04-29 DIAGNOSIS — S22080A Wedge compression fracture of T11-T12 vertebra, initial encounter for closed fracture: Secondary | ICD-10-CM | POA: Diagnosis not present

## 2020-04-30 ENCOUNTER — Other Ambulatory Visit (HOSPITAL_COMMUNITY)
Admission: RE | Admit: 2020-04-30 | Discharge: 2020-04-30 | Disposition: A | Discharge status: Home / Self Care | Payer: Medicare Other | Source: Ambulatory Visit | Attending: Neurosurgery | Admitting: Neurosurgery

## 2020-04-30 DIAGNOSIS — Z01812 Encounter for preprocedural laboratory examination: Secondary | ICD-10-CM | POA: Diagnosis not present

## 2020-04-30 DIAGNOSIS — Z20822 Contact with and (suspected) exposure to covid-19: Secondary | ICD-10-CM | POA: Diagnosis not present

## 2020-04-30 LAB — SARS CORONAVIRUS 2 (TAT 6-24 HRS): SARS Coronavirus 2: NEGATIVE

## 2020-05-01 ENCOUNTER — Encounter (HOSPITAL_COMMUNITY): Payer: Self-pay | Admitting: Neurosurgery

## 2020-05-01 MED ORDER — ALBUTEROL SULFATE HFA 108 (90 BASE) MCG/ACT IN AERS: 2.0000 | INHALATION_SPRAY | Freq: Once | RESPIRATORY_TRACT | Status: DC | PRN

## 2020-05-01 MED ORDER — SODIUM CHLORIDE 0.9 % IV SOLN: INTRAVENOUS | Status: DC | PRN

## 2020-05-01 MED ORDER — FAMOTIDINE IN NACL 20-0.9 MG/50ML-% IV SOLN: 20.0000 mg | Freq: Once | INTRAVENOUS | Status: DC | PRN

## 2020-05-01 MED ORDER — SODIUM CHLORIDE 0.9 % IV SOLN: 700.0000 mg | Freq: Once | INTRAVENOUS | Status: DC

## 2020-05-01 MED ORDER — METHYLPREDNISOLONE SODIUM SUCC 125 MG IJ SOLR: 125.0000 mg | Freq: Once | INTRAMUSCULAR | Status: DC | PRN

## 2020-05-01 MED ORDER — DIPHENHYDRAMINE HCL 50 MG/ML IJ SOLN: 50.0000 mg | Freq: Once | INTRAMUSCULAR | Status: DC | PRN

## 2020-05-01 MED ORDER — EPINEPHRINE 0.3 MG/0.3ML IJ SOAJ: 0.3000 mg | Freq: Once | INTRAMUSCULAR | Status: DC | PRN

## 2020-05-01 NOTE — Progress Notes (Addendum)
Mrs Pleas denies chest pain or shortness of breath. Patient tested negative for Covid_6/2/21_ and has been in quarantine since that time`, she went to have brace fitted following Covid protection guidelines.  Mrs Longhenry cannot take medications with out a mall bite of food, I instructed patient to not take oral medications/  I instructed patient to bring Albuterol inhaler with her.

## 2020-05-02 ENCOUNTER — Encounter (HOSPITAL_COMMUNITY)
Admission: RE | Disposition: A | Discharge status: Home / Self Care | Payer: Self-pay | Source: Home / Self Care | Attending: Neurosurgery

## 2020-05-02 ENCOUNTER — Encounter (HOSPITAL_COMMUNITY): Payer: Self-pay | Admitting: Neurosurgery

## 2020-05-02 ENCOUNTER — Ambulatory Visit (HOSPITAL_COMMUNITY): Payer: Medicare Other

## 2020-05-02 ENCOUNTER — Other Ambulatory Visit: Payer: Self-pay

## 2020-05-02 ENCOUNTER — Ambulatory Visit: Payer: Medicare Other | Admitting: Family Medicine

## 2020-05-02 ENCOUNTER — Observation Stay (HOSPITAL_COMMUNITY)
Admission: RE | Admit: 2020-05-02 | Discharge: 2020-05-03 | Disposition: A | Discharge status: Home / Self Care | Payer: Medicare Other | Attending: Neurosurgery | Admitting: Neurosurgery

## 2020-05-02 ENCOUNTER — Ambulatory Visit (HOSPITAL_COMMUNITY): Payer: Medicare Other | Admitting: Certified Registered Nurse Anesthetist

## 2020-05-02 DIAGNOSIS — I739 Peripheral vascular disease, unspecified: Secondary | ICD-10-CM | POA: Diagnosis not present

## 2020-05-02 DIAGNOSIS — Z885 Allergy status to narcotic agent status: Secondary | ICD-10-CM | POA: Diagnosis not present

## 2020-05-02 DIAGNOSIS — Z8673 Personal history of transient ischemic attack (TIA), and cerebral infarction without residual deficits: Secondary | ICD-10-CM | POA: Diagnosis not present

## 2020-05-02 DIAGNOSIS — X500XXA Overexertion from strenuous movement or load, initial encounter: Secondary | ICD-10-CM | POA: Insufficient documentation

## 2020-05-02 DIAGNOSIS — E039 Hypothyroidism, unspecified: Secondary | ICD-10-CM | POA: Diagnosis not present

## 2020-05-02 DIAGNOSIS — Z888 Allergy status to other drugs, medicaments and biological substances status: Secondary | ICD-10-CM | POA: Diagnosis not present

## 2020-05-02 DIAGNOSIS — S22080A Wedge compression fracture of T11-T12 vertebra, initial encounter for closed fracture: Principal | ICD-10-CM | POA: Insufficient documentation

## 2020-05-02 DIAGNOSIS — Z882 Allergy status to sulfonamides status: Secondary | ICD-10-CM | POA: Diagnosis not present

## 2020-05-02 DIAGNOSIS — I2510 Atherosclerotic heart disease of native coronary artery without angina pectoris: Secondary | ICD-10-CM | POA: Insufficient documentation

## 2020-05-02 DIAGNOSIS — I509 Heart failure, unspecified: Secondary | ICD-10-CM | POA: Diagnosis not present

## 2020-05-02 DIAGNOSIS — J449 Chronic obstructive pulmonary disease, unspecified: Secondary | ICD-10-CM | POA: Insufficient documentation

## 2020-05-02 DIAGNOSIS — Z880 Allergy status to penicillin: Secondary | ICD-10-CM | POA: Insufficient documentation

## 2020-05-02 DIAGNOSIS — Z87891 Personal history of nicotine dependence: Secondary | ICD-10-CM | POA: Insufficient documentation

## 2020-05-02 DIAGNOSIS — E785 Hyperlipidemia, unspecified: Secondary | ICD-10-CM | POA: Diagnosis not present

## 2020-05-02 DIAGNOSIS — U071 COVID-19: Secondary | ICD-10-CM

## 2020-05-02 DIAGNOSIS — Z881 Allergy status to other antibiotic agents status: Secondary | ICD-10-CM | POA: Insufficient documentation

## 2020-05-02 DIAGNOSIS — Z419 Encounter for procedure for purposes other than remedying health state, unspecified: Secondary | ICD-10-CM

## 2020-05-02 HISTORY — PX: KYPHOPLASTY: SHX5884

## 2020-05-02 LAB — BASIC METABOLIC PANEL
Anion gap: 12 (ref 5–15)
BUN: 16 mg/dL (ref 8–23)
CO2: 27 mmol/L (ref 22–32)
Calcium: 9 mg/dL (ref 8.9–10.3)
Chloride: 100 mmol/L (ref 98–111)
Creatinine, Ser: 0.99 mg/dL (ref 0.44–1.00)
GFR calc Af Amer: 58 mL/min — ABNORMAL LOW (ref >60)
GFR calc non Af Amer: 50 mL/min — ABNORMAL LOW (ref >60)
Glucose, Bld: 97 mg/dL (ref 70–99)
Potassium: 3.7 mmol/L (ref 3.5–5.1)
Sodium: 139 mmol/L (ref 135–145)

## 2020-05-02 SURGERY — KYPHOPLASTY
Anesthesia: General

## 2020-05-02 MED ORDER — PROPOFOL 10 MG/ML IV BOLUS
INTRAVENOUS | Status: DC | PRN
  Administered 2020-05-02: 80 mg via INTRAVENOUS

## 2020-05-02 MED ORDER — HYDROCODONE-ACETAMINOPHEN 5-325 MG PO TABS: 1.0000 | ORAL_TABLET | ORAL | Status: DC | PRN

## 2020-05-02 MED ORDER — DEXAMETHASONE SODIUM PHOSPHATE 10 MG/ML IJ SOLN
INTRAMUSCULAR | Status: AC
  Filled 2020-05-02: qty 2

## 2020-05-02 MED ORDER — DEXAMETHASONE SODIUM PHOSPHATE 10 MG/ML IJ SOLN
INTRAMUSCULAR | Status: DC | PRN
  Administered 2020-05-02: 4 mg via INTRAVENOUS

## 2020-05-02 MED ORDER — MOMETASONE FURO-FORMOTEROL FUM 200-5 MCG/ACT IN AERO
2.0000 | INHALATION_SPRAY | Freq: Two times a day (BID) | RESPIRATORY_TRACT | Status: DC
  Administered 2020-05-03: 2 via RESPIRATORY_TRACT
  Filled 2020-05-02: qty 8.8

## 2020-05-02 MED ORDER — ALBUTEROL SULFATE (2.5 MG/3ML) 0.083% IN NEBU: 3.0000 mL | INHALATION_SOLUTION | Freq: Four times a day (QID) | RESPIRATORY_TRACT | Status: DC | PRN

## 2020-05-02 MED ORDER — EPHEDRINE SULFATE 50 MG/ML IJ SOLN
INTRAMUSCULAR | Status: DC | PRN
  Administered 2020-05-02 (×2): 2.5 mg via INTRAVENOUS

## 2020-05-02 MED ORDER — ATORVASTATIN CALCIUM 40 MG PO TABS
40.0000 mg | ORAL_TABLET | Freq: Every day | ORAL | Status: DC
  Administered 2020-05-02: 40 mg via ORAL
  Filled 2020-05-02: qty 1

## 2020-05-02 MED ORDER — ORAL CARE MOUTH RINSE: 15.0000 mL | Freq: Once | OROMUCOSAL | Status: AC

## 2020-05-02 MED ORDER — CHLORHEXIDINE GLUCONATE 0.12 % MT SOLN
15.0000 mL | Freq: Once | OROMUCOSAL | Status: AC
  Administered 2020-05-02: 15 mL via OROMUCOSAL
  Filled 2020-05-02: qty 15

## 2020-05-02 MED ORDER — POTASSIUM CHLORIDE IN NACL 20-0.9 MEQ/L-% IV SOLN: INTRAVENOUS | Status: DC

## 2020-05-02 MED ORDER — ROCURONIUM BROMIDE 10 MG/ML (PF) SYRINGE
PREFILLED_SYRINGE | INTRAVENOUS | Status: DC | PRN
  Administered 2020-05-02: 40 mg via INTRAVENOUS

## 2020-05-02 MED ORDER — IPRATROPIUM BROMIDE 0.06 % NA SOLN
2.0000 | Freq: Four times a day (QID) | NASAL | Status: DC | PRN
  Filled 2020-05-02: qty 15

## 2020-05-02 MED ORDER — SUGAMMADEX SODIUM 200 MG/2ML IV SOLN
INTRAVENOUS | Status: DC | PRN
  Administered 2020-05-02: 200 mg via INTRAVENOUS

## 2020-05-02 MED ORDER — LACTATED RINGERS IV SOLN: INTRAVENOUS | Status: DC

## 2020-05-02 MED ORDER — VANCOMYCIN HCL IN DEXTROSE 1-5 GM/200ML-% IV SOLN
1000.0000 mg | INTRAVENOUS | Status: AC
  Administered 2020-05-02: 1000 mg via INTRAVENOUS
  Filled 2020-05-02: qty 200

## 2020-05-02 MED ORDER — FENTANYL CITRATE (PF) 100 MCG/2ML IJ SOLN
INTRAMUSCULAR | Status: DC | PRN
  Administered 2020-05-02 (×2): 50 ug via INTRAVENOUS

## 2020-05-02 MED ORDER — CHLORHEXIDINE GLUCONATE CLOTH 2 % EX PADS: 6.0000 | MEDICATED_PAD | Freq: Once | CUTANEOUS | Status: DC

## 2020-05-02 MED ORDER — LEVOTHYROXINE SODIUM 100 MCG PO TABS
100.0000 ug | ORAL_TABLET | Freq: Every day | ORAL | Status: DC
  Administered 2020-05-03: 100 ug via ORAL
  Filled 2020-05-02: qty 1

## 2020-05-02 MED ORDER — PHENYLEPHRINE HCL (PRESSORS) 10 MG/ML IV SOLN
INTRAVENOUS | Status: DC | PRN
  Administered 2020-05-02 (×3): 80 ug via INTRAVENOUS

## 2020-05-02 MED ORDER — LIDOCAINE-EPINEPHRINE 0.5 %-1:200000 IJ SOLN
INTRAMUSCULAR | Status: AC
  Filled 2020-05-02: qty 1

## 2020-05-02 MED ORDER — PHENYLEPHRINE HCL-NACL 10-0.9 MG/250ML-% IV SOLN
INTRAVENOUS | Status: DC | PRN
Start: 2020-05-02 — End: 2020-05-02
  Administered 2020-05-02: 35 ug/min via INTRAVENOUS

## 2020-05-02 MED ORDER — IOHEXOL 300 MG/ML  SOLN
INTRAMUSCULAR | Status: DC | PRN
  Administered 2020-05-02: 50 mL

## 2020-05-02 MED ORDER — LIDOCAINE 2% (20 MG/ML) 5 ML SYRINGE
INTRAMUSCULAR | Status: DC | PRN
  Administered 2020-05-02: 40 mg via INTRAVENOUS

## 2020-05-02 MED ORDER — MIDAZOLAM HCL 2 MG/2ML IJ SOLN
INTRAMUSCULAR | Status: AC
  Filled 2020-05-02: qty 2

## 2020-05-02 MED ORDER — ROCURONIUM BROMIDE 10 MG/ML (PF) SYRINGE
PREFILLED_SYRINGE | INTRAVENOUS | Status: AC
  Filled 2020-05-02: qty 10

## 2020-05-02 MED ORDER — FENTANYL CITRATE (PF) 250 MCG/5ML IJ SOLN
INTRAMUSCULAR | Status: AC
  Filled 2020-05-02: qty 5

## 2020-05-02 MED ORDER — LIDOCAINE 2% (20 MG/ML) 5 ML SYRINGE
INTRAMUSCULAR | Status: AC
  Filled 2020-05-02: qty 5

## 2020-05-02 MED ORDER — 0.9 % SODIUM CHLORIDE (POUR BTL) OPTIME
TOPICAL | Status: DC | PRN
  Administered 2020-05-02: 1000 mL

## 2020-05-02 MED ORDER — ACETAMINOPHEN 500 MG PO TABS
500.0000 mg | ORAL_TABLET | Freq: Four times a day (QID) | ORAL | Status: DC | PRN
  Administered 2020-05-03: 500 mg via ORAL
  Filled 2020-05-02: qty 1

## 2020-05-02 MED ORDER — ONDANSETRON HCL 4 MG/2ML IJ SOLN
INTRAMUSCULAR | Status: DC | PRN
  Administered 2020-05-02: 4 mg via INTRAVENOUS

## 2020-05-02 MED ORDER — LIDOCAINE-EPINEPHRINE 0.5 %-1:200000 IJ SOLN
INTRAMUSCULAR | Status: DC | PRN
  Administered 2020-05-02: 8 mL
  Administered 2020-05-02: 6 mL

## 2020-05-02 SURGICAL SUPPLY — 40 items
BLADE CLIPPER SURG (BLADE) IMPLANT
BLADE SURG 15 STRL LF DISP TIS (BLADE) ×1 IMPLANT
BLADE SURG 15 STRL SS (BLADE) ×2
CARTRIDGE OIL MAESTRO DRILL (MISCELLANEOUS) IMPLANT
CEMENT KYPHON CX01A KIT/MIXER (Cement) ×4 IMPLANT
COVER WAND RF STERILE (DRAPES) ×2 IMPLANT
DERMABOND ADVANCED (GAUZE/BANDAGES/DRESSINGS) ×1
DERMABOND ADVANCED .7 DNX12 (GAUZE/BANDAGES/DRESSINGS) ×1 IMPLANT
DIFFUSER DRILL AIR PNEUMATIC (MISCELLANEOUS) IMPLANT
DRAPE C-ARM 42X72 X-RAY (DRAPES) ×4 IMPLANT
DRAPE HALF SHEET 40X57 (DRAPES) IMPLANT
DRAPE INCISE IOBAN 66X45 STRL (DRAPES) ×2 IMPLANT
DRAPE LAPAROTOMY 100X72X124 (DRAPES) ×2 IMPLANT
DRAPE SURG 17X23 STRL (DRAPES) ×2 IMPLANT
DRAPE WARM FLUID 44X44 (DRAPES) ×2 IMPLANT
DURAPREP 26ML APPLICATOR (WOUND CARE) ×2 IMPLANT
GAUZE 4X4 16PLY RFD (DISPOSABLE) ×2 IMPLANT
GLOVE ECLIPSE 6.5 STRL STRAW (GLOVE) ×2 IMPLANT
GLOVE EXAM NITRILE XL STR (GLOVE) IMPLANT
GOWN STRL REUS W/ TWL LRG LVL3 (GOWN DISPOSABLE) ×2 IMPLANT
GOWN STRL REUS W/ TWL XL LVL3 (GOWN DISPOSABLE) IMPLANT
GOWN STRL REUS W/TWL 2XL LVL3 (GOWN DISPOSABLE) IMPLANT
GOWN STRL REUS W/TWL LRG LVL3 (GOWN DISPOSABLE) ×4
GOWN STRL REUS W/TWL XL LVL3 (GOWN DISPOSABLE)
KIT BASIN OR (CUSTOM PROCEDURE TRAY) ×2 IMPLANT
KIT TURNOVER KIT B (KITS) ×2 IMPLANT
NEEDLE HYPO 22GX1.5 SAFETY (NEEDLE) ×2 IMPLANT
NEEDLE HYPO 25X1 1.5 SAFETY (NEEDLE) ×2 IMPLANT
NS IRRIG 1000ML POUR BTL (IV SOLUTION) ×2 IMPLANT
OIL CARTRIDGE MAESTRO DRILL (MISCELLANEOUS)
PACK SURGICAL SETUP 50X90 (CUSTOM PROCEDURE TRAY) ×2 IMPLANT
PENCIL BUTTON HOLSTER BLD 10FT (ELECTRODE) ×2 IMPLANT
SPECIMEN JAR SMALL (MISCELLANEOUS) IMPLANT
STAPLER SKIN PROX WIDE 3.9 (STAPLE) IMPLANT
SUT VIC AB 3-0 SH 8-18 (SUTURE) ×2 IMPLANT
SYR CONTROL 10ML LL (SYRINGE) ×4 IMPLANT
SYSTEM BONE CEMENT MIXING (KITS) ×2 IMPLANT
TOWEL GREEN STERILE (TOWEL DISPOSABLE) ×2 IMPLANT
TOWEL GREEN STERILE FF (TOWEL DISPOSABLE) ×2 IMPLANT
TRAY KYPHOPAK 15/2 EXPRESS (KITS) ×2 IMPLANT

## 2020-05-02 NOTE — Transfer of Care (Signed)
Immediate Anesthesia Transfer of Care Note  Patient: SHAUNIE BOEHM  Procedure(s) Performed: Thoracic eleven KYPHOPLASTY (N/A )  Patient Location: PACU  Anesthesia Type:General  Level of Consciousness: awake and alert   Airway & Oxygen Therapy: Patient Spontanous Breathing and Patient connected to nasal cannula oxygen  Post-op Assessment: Report given to RN and Post -op Vital signs reviewed and stable  Post vital signs: Reviewed and stable  Last Vitals:  Vitals Value Taken Time  BP 129/64 05/02/20 1554  Temp    Pulse 92 05/02/20 1559  Resp 14 05/02/20 1559  SpO2 98 % 05/02/20 1559  Vitals shown include unvalidated device data.  Last Pain:  Vitals:   05/02/20 1123  TempSrc:   PainSc: 0-No pain         Complications: No apparent anesthesia complications

## 2020-05-02 NOTE — Anesthesia Preprocedure Evaluation (Signed)
Anesthesia Evaluation  Patient identified by MRN, date of birth, ID band Patient awake    Reviewed: Allergy & Precautions, NPO status , Patient's Chart, lab work & pertinent test results  Airway Mallampati: II  TM Distance: >3 FB Neck ROM: Full    Dental  (+) Dental Advisory Given   Pulmonary COPD, former smoker,    breath sounds clear to auscultation       Cardiovascular + CAD, + Peripheral Vascular Disease and +CHF   Rhythm:Regular Rate:Normal  08/2018 Echo. Normal EF. Mild AS   Neuro/Psych CVA    GI/Hepatic Neg liver ROS, GERD  ,  Endo/Other  Hypothyroidism   Renal/GU negative Renal ROS     Musculoskeletal   Abdominal   Peds  Hematology negative hematology ROS (+)   Anesthesia Other Findings   Reproductive/Obstetrics                             Anesthesia Physical Anesthesia Plan  ASA: III  Anesthesia Plan: General   Post-op Pain Management:    Induction: Intravenous  PONV Risk Score and Plan: 3 and Dexamethasone, Ondansetron and Treatment may vary due to age or medical condition  Airway Management Planned: Oral ETT  Additional Equipment: None  Intra-op Plan:   Post-operative Plan: Extubation in OR  Informed Consent: I have reviewed the patients History and Physical, chart, labs and discussed the procedure including the risks, benefits and alternatives for the proposed anesthesia with the patient or authorized representative who has indicated his/her understanding and acceptance.     Dental advisory given  Plan Discussed with: CRNA  Anesthesia Plan Comments:         Anesthesia Quick Evaluation

## 2020-05-02 NOTE — Op Note (Signed)
BP (!) 165/75   Pulse (!) 57   Temp 98 F (36.7 C) (Oral)   Resp 17   Ht 5\' 2"  (1.575 m)   Wt 40.4 kg   SpO2 97%   BMI 16.28 kg/m  05/02/2020  3:47 PM  PATIENT:  Holly [name]  86 y.o. female with severe pain in her back. She has an acute compression fracture of T11  PRE-OPERATIVE DIAGNOSIS:  closed wedge compression fracture of thoracic eleven vertebra  POST-OPERATIVE DIAGNOSIS:  closed wedge compression fracture of thoracic eleven vertebra  PROCEDURE:  Procedure(s): Thoracic eleven KYPHOPLASTY  SURGEON:  Surgeon(s): Ashok Pall, MD  ANESTHESIA:   general  EBL:  Total I/O In: 700 [I.V.:700] Out: 5 [Blood:5]  BLOOD ADMINISTERED:none  COUNT:per nursing  SPECIMEN:  No Specimen  DICTATION: Mrs. Labombard was taken to the operating room, intubated and placed under a general anesthetic without difficulty. She was positioned prone on the operating room table with all pressure points properly padded. Her back was prepped and draped in a sterile manner. With fluoroscopy I localized the T11 pedicles bilaterally. I injected lidocaine into the entry sites on both the left and right sides. I started by making a stab incision on the right side and entering the right pedicle with fluoroscopic guidance. Once good position was obtained, I drilled into the vertebral body. I then placed the kyphoplasty balloon into the T11 vertebra and inflated the balloon. I then inserted 2cc of methylmethacrylate into the vertebral body under fluoroscopic guidance. Identical procedure on the left. I injected 3cc methylmethacrylate I achieved a very good fill of the cavities, staying within the confines of the vertebral body. I removed the instrumentation from the vertebral body, and the final films looked good. I closed the stab incision with vicryl suture and used Dermabond for a sterile dressing. Marland Kitchen    PLAN OF CARE: Admit to inpatient   PATIENT DISPOSITION:  PACU - hemodynamically stable.   Delay start of  Pharmacological VTE agent (>24hrs) due to surgical blood loss or risk of bleeding:  yes

## 2020-05-02 NOTE — Anesthesia Procedure Notes (Signed)
Procedure Name: Intubation Date/Time: 05/02/2020 2:27 PM Performed by: Inda Coke, CRNA Pre-anesthesia Checklist: Patient identified, Emergency Drugs available, Suction available and Patient being monitored Patient Re-evaluated:Patient Re-evaluated prior to induction Oxygen Delivery Method: Circle System Utilized Preoxygenation: Pre-oxygenation with 100% oxygen Induction Type: IV induction Ventilation: Mask ventilation without difficulty and Oral airway inserted - appropriate to patient size Laryngoscope Size: Mac and 3 Grade View: Grade I Tube type: Oral Tube size: 7.0 mm Number of attempts: 1 Airway Equipment and Method: Stylet and Oral airway Placement Confirmation: ETT inserted through vocal cords under direct vision,  positive ETCO2 and breath sounds checked- equal and bilateral Secured at: 19 cm Tube secured with: Tape Dental Injury: Teeth and Oropharynx as per pre-operative assessment

## 2020-05-02 NOTE — Anesthesia Postprocedure Evaluation (Signed)
Anesthesia Post Note  Patient: Holly [name]  Procedure(s) Performed: Thoracic eleven KYPHOPLASTY (N/A )     Patient location during evaluation: PACU Anesthesia Type: General Level of consciousness: awake and alert Pain management: pain level controlled Vital Signs Assessment: post-procedure vital signs reviewed and stable Respiratory status: spontaneous breathing, nonlabored ventilation, respiratory function stable and patient connected to nasal cannula oxygen Cardiovascular status: blood pressure returned to baseline and stable Postop Assessment: no apparent nausea or vomiting Anesthetic complications: no    Last Vitals:  Vitals:   05/02/20 1609 05/02/20 1624  BP: 135/61 (!) 148/70  Pulse: 90 87  Resp: 11 11  Temp:  36.4 C  SpO2: 95% 97%    Last Pain:  Vitals:   05/02/20 1624  TempSrc:   PainSc: 0-No pain                 Tiajuana Amass

## 2020-05-02 NOTE — H&P (Signed)
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  Grandmother was lifting up a pottery jar weighing about 15 pounds, when she heard a pop.  She is 86 years of age, and otherwise in good health, has decreased hearing bilaterally, but other than that, is quite the woman.  She is 62 inches, weighs 93 pounds, blood pressure is 128/76, pulse is 59, temperature is 96.8. Pain is 8/10.  She is remarkably scoliotic and has significant kyphosis in the thoracic region.  It has been hard for a brace to be modified to fit her.  She had it placed originally, then went back for a fitting, and neither one has been satisfactory.  According the Angie and her grandmother, she has been in a great deal of pain during this time period.  She takes Tylenol 3, which gives her 3 hours of relief.  She is alert, oriented by 4, decreased hearing bilaterally.  Pupils equal, round, and reactive to light.  Full extraocular movements.  Full visual fields. 5/5 strength in the upper and lower extremities.  She is quite small.  Gait is otherwise normal.     MRI of the thoracic spine was performed on 05/23 and shows an acute compression fracture of T11.  I have proposed, and they have agreed to undergo a T11 kyphoplasty, and we will try to get a different brace at that point.  Given her height and her size, I do believe a simple lumbar brace might be better, and she does not need the thoracic extension because it will extend into the lower thoracic region.  But we will see how she does.  She understands the risks and benefits.

## 2020-05-03 DIAGNOSIS — S22080A Wedge compression fracture of T11-T12 vertebra, initial encounter for closed fracture: Secondary | ICD-10-CM | POA: Diagnosis not present

## 2020-05-03 DIAGNOSIS — I739 Peripheral vascular disease, unspecified: Secondary | ICD-10-CM | POA: Diagnosis not present

## 2020-05-03 DIAGNOSIS — I2510 Atherosclerotic heart disease of native coronary artery without angina pectoris: Secondary | ICD-10-CM | POA: Diagnosis not present

## 2020-05-03 DIAGNOSIS — J449 Chronic obstructive pulmonary disease, unspecified: Secondary | ICD-10-CM | POA: Diagnosis not present

## 2020-05-03 DIAGNOSIS — I509 Heart failure, unspecified: Secondary | ICD-10-CM | POA: Diagnosis not present

## 2020-05-03 DIAGNOSIS — Z87891 Personal history of nicotine dependence: Secondary | ICD-10-CM | POA: Diagnosis not present

## 2020-05-03 NOTE — Discharge Summary (Signed)
Physician Discharge Summary  Patient ID: KIYANNE OM MRN: [id number] DOB/AGE: [birth year] 86 y.o.  Admit date: 05/02/2020 Discharge date: 05/03/2020  Admission Diagnoses:  T11 compression fx   Discharge Diagnoses: same   Discharged Condition: good  Hospital Course: The patient was admitted on 05/02/2020 and taken to the operating room where the patient underwent kyphoplasty T11. The patient tolerated the procedure well and was taken to the recovery room and then to the floor in stable condition. The hospital course was routine. There were no complications. The wound remained clean dry and intact. Pt had appropriate back soreness. No complaints of leg pain or new N/T/W. The patient remained afebrile with stable vital signs, and tolerated a regular diet. The patient continued to increase activities, and pain was well controlled with oral pain medications.   Consults: None  Significant Diagnostic Studies:  Results for orders placed or performed during the hospital encounter of 05/02/20  Basic metabolic panel  Result Value Ref Range   Sodium 139 135 - 145 mmol/L   Potassium 3.7 3.5 - 5.1 mmol/L   Chloride 100 98 - 111 mmol/L   CO2 27 22 - 32 mmol/L   Glucose, Bld 97 70 - 99 mg/dL   BUN 16 8 - 23 mg/dL   Creatinine, Ser 2.53 0.44 - 1.00 mg/dL   Calcium 9.0 8.9 - 66.4 mg/dL   GFR calc non Af Amer 50 (L) >60 mL/min   GFR calc Af Amer 58 (L) >60 mL/min   Anion gap 12 5 - 15    DG Thoracic Spine 2 View  Result Date: 05/02/2020 CLINICAL DATA:  T11 kyphoplasty EXAM: THORACIC SPINE 2 VIEWS; DG C-ARM 1-60 MIN COMPARISON:  04/20/2020 FINDINGS: Two fluoroscopic images are obtained during the performance of the procedure and are submitted for interpretation only. Images demonstrate vertebral augmentation with minimal extravasation of methylmethacrylate along the anterior margin of the vertebral body. FLUOROSCOPY TIME:  2 minutes 35 seconds IMPRESSION: 1. Intraoperative exam as above. Electronically  Signed   By: Sharlet Salina M.D.   On: 05/02/2020 18:50   DG Lumbar Spine Complete  Result Date: 04/20/2020 CLINICAL DATA:  Back pain EXAM: LUMBAR SPINE - COMPLETE 4+ VIEW COMPARISON:  None. FINDINGS: There is diffuse osteopenia which limits detection of nondisplaced fractures. There is some mild age-indeterminate height loss of the L5 vertebral body. There is age-indeterminate height loss of the T11 and T10 vertebral bodies. Multilevel disc height loss is noted throughout the lumbar spine with multilevel facet arthrosis. Vascular calcifications are noted of the abdominal aorta. There is a degenerative levoscoliosis centered in the mid lumbar spine. IMPRESSION: 1. Detection of fractures is severely limited by diffuse osteopenia. 2. There is age-indeterminate height loss of the T10, T11, and likely the L5 vertebral body. 3.  Aortic Atherosclerosis (ICD10-I70.0). Electronically Signed   By: Katherine Mantle M.D.   On: 04/20/2020 02:36   MR THORACIC SPINE WO CONTRAST  Result Date: 04/20/2020 CLINICAL DATA:  Initial evaluation for acute back pain. EXAM: MRI THORACIC AND LUMBAR SPINE WITHOUT CONTRAST TECHNIQUE: Multiplanar and multiecho pulse sequences of the thoracic and lumbar spine were obtained without intravenous contrast. COMPARISON:  Prior radiograph from earlier the same day. FINDINGS: MRI THORACIC SPINE FINDINGS Alignment: Marked exaggeration of the normal thoracic kyphosis with underlying mild dextroscoliosis. No significant listhesis. Vertebrae: Acute compression fracture involving the T11 vertebral body with up to 40% height loss with trace 2 mm bony retropulsion. No significant spinal stenosis or cord impingement. This is benign/mechanical in  appearance. Otherwise, vertebral body height maintained without evidence for acute or chronic fracture. Underlying bone marrow signal intensity within normal limits. No discrete or worrisome osseous lesions. No other abnormal marrow edema. Cord:  Signal  intensity within the thoracic spinal cord is normal. Paraspinal and other soft tissues: Paraspinous soft tissues demonstrate no acute finding. Partially visualized lungs are grossly clear. Atherosclerotic change noted within the visualized aorta. 7 mm T2 hypointense lesion noted within the interpolar left kidney, indeterminate, but could reflect a focal calcification. Few additional left renal cysts partially visualized. Disc levels: Degenerative intervertebral disc space narrowing noted at T6-7 and T7-8 with associated mild reactive endplate changes, most likely due to kyphotic angulation. Mild noncompressive disc bulging noted at T10-11 without stenosis. Mild disc bulge with small right paracentral disc protrusion noted at T11-12 without stenosis. Otherwise, no other significant degenerative changes seen for patient age. No significant stenosis or neural impingement. MRI LUMBAR SPINE FINDINGS Segmentation: Standard. Lowest well-formed disc space labeled the L5-S1 level. Alignment: Trace 2 mm anterolisthesis of L4 on L5, chronic and facet mediated. Underlying levoscoliosis. Alignment otherwise normal with preservation of the normal lumbar lordosis. Vertebrae: Vertebral body height maintained without evidence for acute or chronic fracture. Bone marrow signal intensity diffusely heterogeneous but within normal limits. 19 mm mildly STIR hyperintense lesion within the L1 vertebral body noted, favored to reflect an atypical hemangioma. No other discrete or worrisome osseous lesions. Conus medullaris and cauda equina: Conus extends to the L1 level. Conus and cauda equina appear normal. Paraspinal and other soft tissues: Paraspinous soft tissues demonstrate no acute finding. Multiple scattered cysts noted within the kidneys bilaterally. Visualized visceral structures otherwise unremarkable. Disc levels: L1-2: Mild diffuse disc bulge with disc desiccation. Mild facet hypertrophy. No canal or foraminal stenosis. L2-3:  Chronic intervertebral disc space narrowing with mild diffuse disc bulge and disc desiccation. Mild bilateral facet hypertrophy. Resultant mild spinal stenosis. Foramina remain patent. L3-4: Disc desiccation with mild annular disc bulge. Mild bilateral facet hypertrophy. No significant spinal stenosis. Foramina remain patent. L4-5: Trace anterolisthesis. Mild disc bulge with disc desiccation. Moderate facet hypertrophy. Resultant moderate canal stenosis. Mild right worse than left L4 foraminal narrowing. L5-S1: Disc desiccation with mild disc bulge. Mild bilateral facet hypertrophy. Resultant mild narrowing of the lateral recesses. Central canal remains patent. Mild left L5 foraminal narrowing. IMPRESSION: MR THORACIC SPINE IMPRESSION 1. Acute compression fracture involving the T11 vertebral body with up to 40% height loss and trace 2 mm bony retropulsion. No significant spinal stenosis or cord impingement. This is benign/mechanical in appearance. 2. No other acute abnormality within the thoracic spine. 3. Marked exaggeration of the normal thoracic kyphosis with relatively mild multilevel degenerative spondylosis for age. No significant stenosis. MR LUMBAR SPINE IMPRESSION 1. No acute abnormality within the lumbar spine.  No fracture. 2. Multilevel degenerative spondylosis, most pronounced at L4-5 were there is resultant moderate spinal stenosis. No frank neural impingement. Electronically Signed   By: Rise Mu M.D.   On: 04/20/2020 06:28   MR LUMBAR SPINE WO CONTRAST  Result Date: 04/20/2020 CLINICAL DATA:  Initial evaluation for acute back pain. EXAM: MRI THORACIC AND LUMBAR SPINE WITHOUT CONTRAST TECHNIQUE: Multiplanar and multiecho pulse sequences of the thoracic and lumbar spine were obtained without intravenous contrast. COMPARISON:  Prior radiograph from earlier the same day. FINDINGS: MRI THORACIC SPINE FINDINGS Alignment: Marked exaggeration of the normal thoracic kyphosis with underlying  mild dextroscoliosis. No significant listhesis. Vertebrae: Acute compression fracture involving the T11 vertebral body with up to  40% height loss with trace 2 mm bony retropulsion. No significant spinal stenosis or cord impingement. This is benign/mechanical in appearance. Otherwise, vertebral body height maintained without evidence for acute or chronic fracture. Underlying bone marrow signal intensity within normal limits. No discrete or worrisome osseous lesions. No other abnormal marrow edema. Cord:  Signal intensity within the thoracic spinal cord is normal. Paraspinal and other soft tissues: Paraspinous soft tissues demonstrate no acute finding. Partially visualized lungs are grossly clear. Atherosclerotic change noted within the visualized aorta. 7 mm T2 hypointense lesion noted within the interpolar left kidney, indeterminate, but could reflect a focal calcification. Few additional left renal cysts partially visualized. Disc levels: Degenerative intervertebral disc space narrowing noted at T6-7 and T7-8 with associated mild reactive endplate changes, most likely due to kyphotic angulation. Mild noncompressive disc bulging noted at T10-11 without stenosis. Mild disc bulge with small right paracentral disc protrusion noted at T11-12 without stenosis. Otherwise, no other significant degenerative changes seen for patient age. No significant stenosis or neural impingement. MRI LUMBAR SPINE FINDINGS Segmentation: Standard. Lowest well-formed disc space labeled the L5-S1 level. Alignment: Trace 2 mm anterolisthesis of L4 on L5, chronic and facet mediated. Underlying levoscoliosis. Alignment otherwise normal with preservation of the normal lumbar lordosis. Vertebrae: Vertebral body height maintained without evidence for acute or chronic fracture. Bone marrow signal intensity diffusely heterogeneous but within normal limits. 19 mm mildly STIR hyperintense lesion within the L1 vertebral body noted, favored to reflect  an atypical hemangioma. No other discrete or worrisome osseous lesions. Conus medullaris and cauda equina: Conus extends to the L1 level. Conus and cauda equina appear normal. Paraspinal and other soft tissues: Paraspinous soft tissues demonstrate no acute finding. Multiple scattered cysts noted within the kidneys bilaterally. Visualized visceral structures otherwise unremarkable. Disc levels: L1-2: Mild diffuse disc bulge with disc desiccation. Mild facet hypertrophy. No canal or foraminal stenosis. L2-3: Chronic intervertebral disc space narrowing with mild diffuse disc bulge and disc desiccation. Mild bilateral facet hypertrophy. Resultant mild spinal stenosis. Foramina remain patent. L3-4: Disc desiccation with mild annular disc bulge. Mild bilateral facet hypertrophy. No significant spinal stenosis. Foramina remain patent. L4-5: Trace anterolisthesis. Mild disc bulge with disc desiccation. Moderate facet hypertrophy. Resultant moderate canal stenosis. Mild right worse than left L4 foraminal narrowing. L5-S1: Disc desiccation with mild disc bulge. Mild bilateral facet hypertrophy. Resultant mild narrowing of the lateral recesses. Central canal remains patent. Mild left L5 foraminal narrowing. IMPRESSION: MR THORACIC SPINE IMPRESSION 1. Acute compression fracture involving the T11 vertebral body with up to 40% height loss and trace 2 mm bony retropulsion. No significant spinal stenosis or cord impingement. This is benign/mechanical in appearance. 2. No other acute abnormality within the thoracic spine. 3. Marked exaggeration of the normal thoracic kyphosis with relatively mild multilevel degenerative spondylosis for age. No significant stenosis. MR LUMBAR SPINE IMPRESSION 1. No acute abnormality within the lumbar spine.  No fracture. 2. Multilevel degenerative spondylosis, most pronounced at L4-5 were there is resultant moderate spinal stenosis. No frank neural impingement. Electronically Signed   By: Rise Mu M.D.   On: 04/20/2020 06:28   DG C-Arm 1-60 Min  Result Date: 05/02/2020 CLINICAL DATA:  T11 kyphoplasty EXAM: THORACIC SPINE 2 VIEWS; DG C-ARM 1-60 MIN COMPARISON:  04/20/2020 FINDINGS: Two fluoroscopic images are obtained during the performance of the procedure and are submitted for interpretation only. Images demonstrate vertebral augmentation with minimal extravasation of methylmethacrylate along the anterior margin of the vertebral body. FLUOROSCOPY TIME:  2 minutes  35 seconds IMPRESSION: 1. Intraoperative exam as above. Electronically Signed   By: Sharlet Salina M.D.   On: 05/02/2020 18:50    Antibiotics:  Anti-infectives (From admission, onward)   Start     Dose/Rate Route Frequency Ordered Stop   05/02/20 1100  vancomycin (VANCOCIN) IVPB 1000 mg/200 mL premix     1,000 mg 200 mL/hr over 60 Minutes Intravenous On call to O.R. 05/02/20 1056 05/02/20 1244      Discharge Exam: Blood pressure 132/71, pulse 77, temperature 98.4 F (36.9 C), temperature source Oral, resp. rate 16, height 5\' 2"  (1.575 m), weight 40.4 kg, SpO2 98 %. Neurologic: Grossly normal Ambulating and voiding well, incision cdi  Discharge Medications:   Allergies as of 05/03/2020      Reactions   Levofloxacin    REACTION: unspecified   Macrobid [nitrofurantoin Macrocrystal] Nausea Only   Sulfamethoxazole    REACTION: unspecified   Tramadol Other (See Comments)   Shakes   Amoxicillin Rash   Has patient had a PCN reaction causing immediate rash, facial/tongue/throat swelling, SOB or lightheadedness with hypotension: No Has patient had a PCN reaction causing severe rash involving mucus membranes or skin necrosis: No Has patient had a PCN reaction that required hospitalization: No Has patient had a PCN reaction occurring within the last 10 years: No If all of the above answers are "NO", then may proceed with Cephalosporin use. Made "bottom area" very raw      Medication List    TAKE these  medications   acetaminophen 500 MG tablet Commonly known as: TYLENOL Take 500 mg by mouth every 6 (six) hours as needed for mild pain.   albuterol 108 (90 Base) MCG/ACT inhaler Commonly known as: Proventil HFA Inhale 1-2 puffs into the lungs every 6 (six) hours as needed for wheezing or shortness of breath.   atorvastatin 40 MG tablet Commonly known as: LIPITOR TAKE 1 TABLET BY MOUTH EVERY DAY What changed: how to take this   ICAPS PO Take 1 capsule by mouth 2 (two) times daily.   ipratropium 0.06 % nasal spray Commonly known as: ATROVENT PLACE 2 SPRAYS INTO BOTH NOSTRILS 4 TIMES DAILY. What changed: See the new instructions.   levothyroxine 100 MCG tablet Commonly known as: SYNTHROID TAKE 1 TABLET BY MOUTH EVERY DAY What changed: when to take this   lidocaine 5 % Commonly known as: Lidoderm Place 1 patch onto the skin daily. Remove & Discard patch within 12 hours or as directed by MD What changed: when to take this   Wixela Inhub 500-50 MCG/DOSE Aepb Generic drug: Fluticasone-Salmeterol Inhale 1 puff into the lungs 2 (two) times daily. Advair       Disposition: home   Final Dx: kyphoplasty T11  Discharge Instructions     Remove dressing in 72 hours   Complete by: As directed    Call MD for:  difficulty breathing, headache or visual disturbances   Complete by: As directed    Call MD for:  hives   Complete by: As directed    Call MD for:  persistant nausea and vomiting   Complete by: As directed    Call MD for:  redness, tenderness, or signs of infection (pain, swelling, redness, odor or green/yellow discharge around incision site)   Complete by: As directed    Call MD for:  severe uncontrolled pain   Complete by: As directed    Call MD for:  temperature >100.4   Complete by: As directed  Diet - low sodium heart healthy   Complete by: As directed    Driving Restrictions   Complete by: As directed    No driving for 2 weeks, no riding in the car for 1 week    Increase activity slowly   Complete by: As directed    Lifting restrictions   Complete by: As directed    No lifting more than 8 lbs      Follow-up Information    Coletta Memos, MD. Call in 2 week(s).   Specialty: Neurosurgery Contact information: [address] [place] [address] [phone]            Signed: Tiana Loft [facility] 05/03/2020, 8:48 AM

## 2020-05-03 NOTE — Plan of Care (Signed)
Patient alert and oriented, mae's well, voiding adequate amount of urine, swallowing without difficulty, no c/o pain at time of discharge. Patient discharged home with family. Script and discharged instructions given to patient. Patient and family stated understanding of instructions given. Patient has an appointment with Dr. Cabbell   

## 2020-06-04 ENCOUNTER — Ambulatory Visit: Payer: Medicare Other | Admitting: Family Medicine

## 2020-06-04 DIAGNOSIS — Z0289 Encounter for other administrative examinations: Secondary | ICD-10-CM

## 2020-06-05 ENCOUNTER — Encounter: Payer: Self-pay | Admitting: Family Medicine

## 2020-06-05 ENCOUNTER — Ambulatory Visit (INDEPENDENT_AMBULATORY_CARE_PROVIDER_SITE_OTHER): Payer: Medicare Other | Admitting: Family Medicine

## 2020-06-05 ENCOUNTER — Other Ambulatory Visit: Payer: Self-pay

## 2020-06-05 VITALS — BP 128/74 | HR 72 | Temp 97.0°F | Ht 62.0 in | Wt 89.4 lb

## 2020-06-05 DIAGNOSIS — E038 Other specified hypothyroidism: Secondary | ICD-10-CM

## 2020-06-05 DIAGNOSIS — S22080D Wedge compression fracture of T11-T12 vertebra, subsequent encounter for fracture with routine healing: Secondary | ICD-10-CM

## 2020-06-05 DIAGNOSIS — R300 Dysuria: Secondary | ICD-10-CM | POA: Diagnosis not present

## 2020-06-05 LAB — POCT URINALYSIS DIPSTICK
Bilirubin, UA: NEGATIVE
Blood, UA: NEGATIVE
Glucose, UA: POSITIVE — AB
Ketones, UA: NEGATIVE
Leukocytes, UA: NEGATIVE
Nitrite, UA: NEGATIVE
Protein, UA: POSITIVE — AB
Spec Grav, UA: >=1.03 — AB (ref 1.010–1.025)
Urobilinogen, UA: 1 E.U./dL
pH, UA: 5.5 (ref 5.0–8.0)

## 2020-06-05 NOTE — Assessment & Plan Note (Signed)
Last TSH at goal.  Doubt this is contributing to fatigue at this point.  Continue Synthroid 100 mcg daily.  If fatigue symptoms persist will need to recheck TSH.

## 2020-06-05 NOTE — Assessment & Plan Note (Signed)
Doing much better.  Declined further pain management this time.

## 2020-06-05 NOTE — Patient Instructions (Signed)
It was very nice to see you today!  You are dehydrated.  Please make sure that you are getting four 500 mL bottles of water daily.  Let me know if symptoms not proving in the next week or so.  Take care, Dr Jerline Pain

## 2020-06-05 NOTE — Progress Notes (Signed)
   Holly [name] is a 86 y.o. female who presents today for an office visit.  Assessment/Plan:  New/Acute Problems: Fatigue/Dizziness Likely secondary to dehydration.  Elevated specific gravity on UA.  No red flags.  Exam normal.  Recently had labs within the last month or so which were normal.  Encourage patient to stay well-hydrated with goal for least 2 L of water daily.  She will follow-up with me in the next week or so if not proving.  Chronic Problems Addressed Today: Closed wedge compression fracture of T11 vertebra ([place]) Doing much better.  Declined further pain management this time.  Hypothyroidism Last TSH at goal.  Doubt this is contributing to fatigue at this point.  Continue Synthroid 100 mcg daily.  If fatigue symptoms persist will need to recheck TSH.     Subjective:  HPI:  Patient with increasing fatigue and dizziness over the last few weeks.  She suffered a vertebral compression fracture about 6 weeks ago.  She ended up having to go to the hospital for kyphoplasty.  Her pain is much better.  She is back to her usual activity level.  Occasionally needs to take Tylenol which works well.  She has had some feeling like she has not passed out.  Has not lost consciousness.  No reported weakness or numbness.  She has also had some increased urinary frequency lately.  Daughter is worried about possible UTI.  Patient has admittedly not done great with keeping up fluids over the last several weeks.       Objective:  Physical Exam: BP 128/74   Pulse 72   Temp (!) 97 F (36.1 C)   Ht 5\' 2"  (1.575 m)   Wt 89 lb 6.1 oz (40.5 kg)   SpO2 100%   BMI 16.35 kg/m   Wt Readings from Last 3 Encounters:  06/05/20 89 lb 6.1 oz (40.5 kg)  05/02/20 89 lb (40.4 kg)  04/07/20 92 lb 3.2 oz (41.8 kg)    Gen: No acute distress, resting comfortably CV: Regular rate and rhythm with no murmurs appreciated Pulm: Normal work of breathing, clear to auscultation bilaterally with no crackles,  wheezes, or rhonchi Neuro: Grossly normal, moves all extremities Psych: Normal affect and thought content      Tariah Transue M. Jerline Pain, MD 06/05/2020 2:41 PM

## 2020-06-06 ENCOUNTER — Ambulatory Visit: Payer: Medicare Other | Admitting: Family Medicine

## 2020-06-06 LAB — URINE CULTURE
MICRO NUMBER:: 10680981
Result:: NO GROWTH
SPECIMEN QUALITY:: ADEQUATE

## 2020-06-09 NOTE — Progress Notes (Signed)
Please inform patient of the following:  Urine culture negative for UTI.  [name]. Jerline Pain, MD 06/09/2020 12:51 PM

## 2020-06-12 DIAGNOSIS — H53411 Scotoma involving central area, right eye: Secondary | ICD-10-CM | POA: Diagnosis not present

## 2020-06-12 DIAGNOSIS — H542X22 Low vision right eye category 2, low vision left eye category 2: Secondary | ICD-10-CM | POA: Diagnosis not present

## 2020-06-19 DIAGNOSIS — H53411 Scotoma involving central area, right eye: Secondary | ICD-10-CM | POA: Diagnosis not present

## 2020-06-19 DIAGNOSIS — H542X22 Low vision right eye category 2, low vision left eye category 2: Secondary | ICD-10-CM | POA: Diagnosis not present

## 2020-07-04 DIAGNOSIS — H542X22 Low vision right eye category 2, low vision left eye category 2: Secondary | ICD-10-CM | POA: Diagnosis not present

## 2020-07-04 DIAGNOSIS — H53411 Scotoma involving central area, right eye: Secondary | ICD-10-CM | POA: Diagnosis not present

## 2020-07-07 ENCOUNTER — Ambulatory Visit (INDEPENDENT_AMBULATORY_CARE_PROVIDER_SITE_OTHER): Payer: Medicare Other

## 2020-07-07 DIAGNOSIS — Z0000 Encounter for general adult medical examination without abnormal findings: Secondary | ICD-10-CM | POA: Diagnosis not present

## 2020-07-07 NOTE — Patient Instructions (Addendum)
Holly [name] , Thank you for taking time to come for your Medicare Wellness Visit. I appreciate your ongoing commitment to your health goals. Please review the following plan we discussed and let me know if I can assist you in the future.   Screening recommendations/referrals: Colonoscopy: No longer required Mammogram: No longer required Bone Density: No Longer required  Recommended yearly ophthalmology/optometry visit for glaucoma screening and checkup Recommended yearly dental visit for hygiene and checkup  Vaccinations: Influenza vaccine: Due with information discussed Pneumococcal vaccine: Up to date Tdap vaccine: Due with information discussed Shingles vaccine: Shingrix discussed. Please contact your pharmacy for coverage information.    Covid-19:Declined and discussed with Patient  Advanced directives: Please bring a copy of your health care power of attorney and living will to the office at your convenience.   Conditions/risks identified: Stay healthy   Next appointment: Follow up in one year for your annual wellness visit    Preventive Care 65 Years and Older, Female Preventive care refers to lifestyle choices and visits with your health care provider that can promote health and wellness. What does preventive care include?  A yearly physical exam. This is also called an annual well check.  Dental exams once or twice a year.  Routine eye exams. Ask your health care provider how often you should have your eyes checked.  Personal lifestyle choices, including:  Daily care of your teeth and gums.  Regular physical activity.  Eating a healthy diet.  Avoiding tobacco and drug use.  Limiting alcohol use.  Practicing safe sex.  Taking low-dose aspirin every day.  Taking vitamin and mineral supplements as recommended by your health care provider. What happens during an annual well check? The services and screenings done by your health care provider during your annual  well check will depend on your age, overall health, lifestyle risk factors, and family history of disease. Counseling  Your health care provider may ask you questions about your:  Alcohol use.  Tobacco use.  Drug use.  Emotional well-being.  Home and relationship well-being.  Sexual activity.  Eating habits.  History of falls.  Memory and ability to understand (cognition).  Work and work Statistician.  Reproductive health. Screening  You may have the following tests or measurements:  Height, weight, and BMI.  Blood pressure.  Lipid and cholesterol levels. These may be checked every 5 years, or more frequently if you are over 64 years old.  Skin check.  Lung cancer screening. You may have this screening every year starting at age 59 if you have a 30-pack-year history of smoking and currently smoke or have quit within the past 15 years.  Fecal occult blood test (FOBT) of the stool. You may have this test every year starting at age 67.  Flexible sigmoidoscopy or colonoscopy. You may have a sigmoidoscopy every 5 years or a colonoscopy every 10 years starting at age 9.  Hepatitis C blood test.  Hepatitis B blood test.  Sexually transmitted disease (STD) testing.  Diabetes screening. This is done by checking your blood sugar (glucose) after you have not eaten for a while (fasting). You may have this done every 1-3 years.  Bone density scan. This is done to screen for osteoporosis. You may have this done starting at age 75.  Mammogram. This may be done every 1-2 years. Talk to your health care provider about how often you should have regular mammograms. Talk with your health care provider about your test results, treatment options, and if  necessary, the need for more tests. Vaccines  Your health care provider may recommend certain vaccines, such as:  Influenza vaccine. This is recommended every year.  Tetanus, diphtheria, and acellular pertussis (Tdap, Td) vaccine.  You may need a Td booster every 10 years.  Zoster vaccine. You may need this after age 30.  Pneumococcal 13-valent conjugate (PCV13) vaccine. One dose is recommended after age 3.  Pneumococcal polysaccharide (PPSV23) vaccine. One dose is recommended after age 89. Talk to your health care provider about which screenings and vaccines you need and how often you need them. This information is not intended to replace advice given to you by your health care provider. Make sure you discuss any questions you have with your health care provider. Document Released: 12/12/2015 Document Revised: 08/04/2016 Document Reviewed: 09/16/2015 Elsevier Interactive Patient Education  2017 [place] Prevention in the Home Falls can cause injuries. They can happen to people of all ages. There are many things you can do to make your home safe and to help prevent falls. What can I do on the outside of my home?  Regularly fix the edges of walkways and driveways and fix any cracks.  Remove anything that might make you trip as you walk through a door, such as a raised step or threshold.  Trim any bushes or trees on the path to your home.  Use bright outdoor lighting.  Clear any walking paths of anything that might make someone trip, such as rocks or tools.  Regularly check to see if handrails are loose or broken. Make sure that both sides of any steps have handrails.  Any raised decks and porches should have guardrails on the edges.  Have any leaves, snow, or ice cleared regularly.  Use sand or salt on walking paths during winter.  Clean up any spills in your garage right away. This includes oil or grease spills. What can I do in the bathroom?  Use night lights.  Install grab bars by the toilet and in the tub and shower. Do not use towel bars as grab bars.  Use non-skid mats or decals in the tub or shower.  If you need to sit down in the shower, use a plastic, non-slip stool.  Keep the  floor dry. Clean up any water that spills on the floor as soon as it happens.  Remove soap buildup in the tub or shower regularly.  Attach bath mats securely with double-sided non-slip rug tape.  Do not have throw rugs and other things on the floor that can make you trip. What can I do in the bedroom?  Use night lights.  Make sure that you have a light by your bed that is easy to reach.  Do not use any sheets or blankets that are too big for your bed. They should not hang down onto the floor.  Have a firm chair that has side arms. You can use this for support while you get dressed.  Do not have throw rugs and other things on the floor that can make you trip. What can I do in the kitchen?  Clean up any spills right away.  Avoid walking on wet floors.  Keep items that you use a lot in easy-to-reach places.  If you need to reach something above you, use a strong step stool that has a grab bar.  Keep electrical cords out of the way.  Do not use floor polish or wax that makes floors slippery. If you must  use wax, use non-skid floor wax.  Do not have throw rugs and other things on the floor that can make you trip. What can I do with my stairs?  Do not leave any items on the stairs.  Make sure that there are handrails on both sides of the stairs and use them. Fix handrails that are broken or loose. Make sure that handrails are as long as the stairways.  Check any carpeting to make sure that it is firmly attached to the stairs. Fix any carpet that is loose or worn.  Avoid having throw rugs at the top or bottom of the stairs. If you do have throw rugs, attach them to the floor with carpet tape.  Make sure that you have a light switch at the top of the stairs and the bottom of the stairs. If you do not have them, ask someone to add them for you. What else can I do to help prevent falls?  Wear shoes that:  Do not have high heels.  Have rubber bottoms.  Are comfortable and fit  you well.  Are closed at the toe. Do not wear sandals.  If you use a stepladder:  Make sure that it is fully opened. Do not climb a closed stepladder.  Make sure that both sides of the stepladder are locked into place.  Ask someone to hold it for you, if possible.  Clearly mark and make sure that you can see:  Any grab bars or handrails.  First and last steps.  Where the edge of each step is.  Use tools that help you move around (mobility aids) if they are needed. These include:  Canes.  Walkers.  Scooters.  Crutches.  Turn on the lights when you go into a dark area. Replace any light bulbs as soon as they burn out.  Set up your furniture so you have a clear path. Avoid moving your furniture around.  If any of your floors are uneven, fix them.  If there are any pets around you, be aware of where they are.  Review your medicines with your doctor. Some medicines can make you feel dizzy. This can increase your chance of falling. Ask your doctor what other things that you can do to help prevent falls. This information is not intended to replace advice given to you by your health care provider. Make sure you discuss any questions you have with your health care provider. Document Released: 09/11/2009 Document Revised: 04/22/2016 Document Reviewed: 12/20/2014 Elsevier Interactive Patient Education  2017 [employer].

## 2020-07-07 NOTE — Progress Notes (Signed)
Virtual Visit via Telephone Note  I connected with  Holly [name] on 07/07/20 at 11:45 AM EDT by telephone and verified that I am speaking with the correct person using two identifiers.  Medicare Annual Wellness visit completed telephonically due to Covid-19 pandemic.   Persons participating in this call: This Health Coach, patient and Granddaughter Levada Dy  Location: Patient: Home Provider: Office   I discussed the limitations, risks, security and privacy concerns of performing an evaluation and management service by telephone and the availability of in person appointments. The patient expressed understanding and agreed to proceed.  Unable to perform video visit due to video visit attempted and failed and/or patient does not have video capability.   Some vital signs may be absent or patient reported.   Willette Brace, LPN    Subjective:   Holly [name] is a 86 y.o. female who presents for Medicare Annual (Subsequent) preventive examination.  Review of Systems      Cardiac Risk Factors include: advanced age (>67men, >3 women);dyslipidemia     Objective:    There were no vitals filed for this visit. There is no height or weight on file to calculate BMI.  Advanced Directives 07/07/2020 12/01/2019 08/31/2018 08/30/2018  Does Patient Have a Medical Advance Directive? - Yes Yes Yes  Type of Advance Directive Living will;Healthcare Power of [place];Living will  Does patient want to make changes to medical advance directive? - - No - Patient declined -  Copy of [place] in Chart? No - copy requested No - copy requested - -    Current Medications (verified) Outpatient Encounter Medications as of 07/07/2020  Medication Sig  . acetaminophen (TYLENOL) 500 MG tablet Take 500 mg by mouth every 6 (six) hours as needed for mild pain.   Marland Kitchen albuterol (PROVENTIL HFA) 108 (90 Base) MCG/ACT  inhaler Inhale 1-2 puffs into the lungs every 6 (six) hours as needed for wheezing or shortness of breath.  Marland Kitchen atorvastatin (LIPITOR) 40 MG tablet TAKE 1 TABLET BY MOUTH EVERY DAY (Patient taking differently: 40 mg daily. )  . Fluticasone-Salmeterol (WIXELA INHUB) 500-50 MCG/DOSE AEPB Inhale 1 puff into the lungs 2 (two) times daily. Advair  . ipratropium (ATROVENT) 0.06 % nasal spray PLACE 2 SPRAYS INTO BOTH NOSTRILS 4 TIMES DAILY. (Patient taking differently: Place 2 sprays into both nostrils 4 (four) times daily as needed for rhinitis. Prn)  . levothyroxine (SYNTHROID) 100 MCG tablet TAKE 1 TABLET BY MOUTH EVERY DAY (Patient taking differently: Take 100 mcg by mouth daily before breakfast. )  . Multiple Vitamins-Minerals (ICAPS PO) Take 1 capsule by mouth 2 (two) times daily.  . [DISCONTINUED] lidocaine (LIDODERM) 5 % Place 1 patch onto the skin daily. Remove & Discard patch within 12 hours or as directed by MD (Patient taking differently: Place 1 patch onto the skin every 12 (twelve) hours. Remove & Discard patch within 12 hours or as directed by MD)   No facility-administered encounter medications on file as of 07/07/2020.    Allergies (verified) Levofloxacin, Macrobid [nitrofurantoin macrocrystal], Sulfamethoxazole, Tramadol, and Amoxicillin   History: Past Medical History:  Diagnosis Date  . CAD (coronary artery disease)   . CELLULITIS 06/18/2008  . CHF (congestive heart failure) ([place])   . CHRONIC OBSTRUCTIVE PULMONARY DISEASE, ACUTE EXACERBATION 11/16/2007  . COPD 05/18/2007  . COVID-19   . GERD 12/25/2007  . HYPERLIPIDEMIA 05/18/2007  . HYPOTHYROIDISM 05/18/2007  . Macular  degeneration   . Stroke ([place]) 08/2018  . Urosepsis    Past Surgical History:  Procedure Laterality Date  . CATARACT EXTRACTION    . DILATION AND CURETTAGE OF UTERUS    . KYPHOPLASTY N/A 05/02/2020   Procedure: Thoracic eleven KYPHOPLASTY;  Surgeon: Ashok Pall, MD;  Location: [facility];  Service: Neurosurgery;   Laterality: N/A;  3C  . TEE WITHOUT CARDIOVERSION N/A 09/01/2018   Procedure: TRANSESOPHAGEAL ECHOCARDIOGRAM (TEE);  Surgeon: Buford Dresser, MD;  Location: [facility] ENDOSCOPY;  Service: Cardiovascular;  Laterality: N/A;  . VARICOSE VEIN SURGERY     Family History  Problem Relation Age of Onset  . Ovarian cancer Mother   . Diabetes Mellitus II Brother    Social History   Socioeconomic History  . Marital status: Widowed    Spouse name: Not on file  . Number of children: Not on file  . Years of education: Not on file  . Highest education level: Not on file  Occupational History  . Occupation: Retired  Tobacco Use  . Smoking status: Former Smoker    Years: 40.00    Quit date: 11/29/1985    Years since quitting: 34.6  . Smokeless tobacco: Never Used  Vaping Use  . Vaping Use: Never used  Substance and Sexual Activity  . Alcohol use: No  . Drug use: No  . Sexual activity: Not on file  Other Topics Concern  . Not on file  Social History Narrative  . Not on file   Social Determinants of Health   Financial Resource Strain: Low Risk   . Difficulty of Paying Living Expenses: Not hard at all  Food Insecurity: No Food Insecurity  . Worried About Charity fundraiser in the Last Year: Never true  . Ran Out of Food in the Last Year: Never true  Transportation Needs: No Transportation Needs  . Lack of Transportation (Medical): No  . Lack of Transportation (Non-Medical): No  Physical Activity: Insufficiently Active  . Days of Exercise per Week: 5 days  . Minutes of Exercise per Session: 10 min  Stress:   . Feeling of Stress :   Social Connections: Socially Isolated  . Frequency of Communication with Friends and Family: More than three times a week  . Frequency of Social Gatherings with Friends and Family: Twice a week  . Attends Religious Services: Never  . Active Member of Clubs or Organizations: No  . Attends Archivist Meetings: Never  . Marital Status: Widowed     Tobacco Counseling Counseling given: Not Answered   Clinical Intake:  Pre-visit preparation completed: Yes  Pain : No/denies pain     BMI - recorded: 16.35 Nutritional Status: BMI <19  Underweight Nutritional Risks: None Diabetes: No  How often do you need to have someone help you when you read instructions, pamphlets, or other written materials from your doctor or pharmacy?: 1 - Never  Diabetic?No  Interpreter Needed?: No  Information entered by :: Charlott Rakes, LPN   Activities of Daily Living In your present state of health, do you have any difficulty performing the following activities: 07/07/2020 05/02/2020  Hearing? N -  Vision? N -  Difficulty concentrating or making decisions? N -  Walking or climbing stairs? N -  Comment don't use stairs -  Dressing or bathing? N -  Doing errands, shopping? N N  Preparing Food and eating ? N -  Using the Toilet? N -  In the past six months, have you accidently leaked  urine? Y -  Comment Pt has urgency -  Do you have problems with loss of bowel control? N -  Managing your Medications? N -  Managing your Finances? N -  Housekeeping or managing your Housekeeping? N -  Some recent data might be hidden    Patient Care Team: Vivi Barrack, MD as PCP - General (Family Medicine) Evans Lance, MD as PCP - Cardiology (Cardiology)  Indicate any recent Medical Services you may have received from other than [facility] providers in the past year (date may be approximate).     Assessment:   This is a routine wellness examination for Cherie.  Hearing/Vision screen  Hearing Screening   125Hz  250Hz  500Hz  1000Hz  2000Hz  3000Hz  4000Hz  6000Hz  8000Hz   Right ear:           Left ear:           Comments: Wears hearing aids   Vision Screening Comments: Follows up with Dr [name] for eye exams  Dietary issues and exercise activities discussed: Current Exercise Habits: Home exercise routine, Type of exercise: walking, Time (Minutes): 10,  Frequency (Times/Week): 5, Weekly Exercise (Minutes/Week): 50  Goals    . Patient Stated     Stay healthy      Depression Screen PHQ 2/9 Scores 07/07/2020 07/06/2019 02/01/2017 12/24/2016 12/23/2015 12/18/2014  PHQ - 2 Score 0 0 0 0 0 1    Fall Risk Fall Risk  07/07/2020 10/03/2018 02/01/2017 12/24/2016 12/23/2015  Falls in the past year? 0 0 No No No  Number falls in past yr: 0 - - - -  Injury with Fall? 0 - - - -  Risk for fall due to : Impaired balance/gait - - - -  Follow up Falls prevention discussed - - - -    Any stairs in or around the home? Yes  If so, are there any without handrails? No  Home free of loose throw rugs in walkways, pet beds, electrical cords, etc? Yes  Adequate lighting in your home to reduce risk of falls? Yes   ASSISTIVE DEVICES UTILIZED TO PREVENT FALLS:  Life alert? Yes  Use of a cane, walker or w/c? No  Grab bars in the bathroom? No  Shower chair or bench in shower? Yes  Elevated toilet seat or a handicapped toilet? No   TIMED UP AND GO:  Was the test performed? No .    Cognitive Function:     6CIT Screen 07/07/2020  What Year? 0 points  What month? 0 points  Count back from 20 0 points  Months in reverse 2 points  Repeat phrase 8 points    Immunizations Immunization History  Administered Date(s) Administered  . Fluad Quad(high Dose 65+) 11/19/2019  . Influenza Split 08/23/2011, 08/22/2012  . Influenza Whole 09/18/2007, 09/10/2008, 09/10/2009, 08/19/2010  . Influenza, High Dose Seasonal PF 09/03/2013, 12/23/2014, 09/26/2015, 08/31/2016, 09/07/2017, 09/07/2018  . Pneumococcal Conjugate-13 12/23/2014  . Pneumococcal Polysaccharide-23 09/17/2013  . Td 06/11/2008    TDAP status: Due, Education has been provided regarding the importance of this vaccine. Advised may receive this vaccine at local pharmacy or Health Dept. Aware to provide a copy of the vaccination record if obtained from local pharmacy or Health Dept. Verbalized acceptance and  understanding. Flu Vaccine status: Up to date Pneumococcal vaccine status: Up to date Covid-19 vaccine status: Declined, Education has been provided regarding the importance of this vaccine but patient still declined. Advised may receive this vaccine at local pharmacy or Health Dept.or vaccine  clinic. Aware to provide a copy of the vaccination record if obtained from local pharmacy or Health Dept. Verbalized acceptance and understanding.  Qualifies for Shingles Vaccine? Yes   Zostavax completed No   Shingrix Completed?: No.    Education has been provided regarding the importance of this vaccine. Patient has been advised to call insurance company to determine out of pocket expense if they have not yet received this vaccine. Advised may also receive vaccine at local pharmacy or Health Dept. Verbalized acceptance and understanding.  Screening Tests Health Maintenance  Topic Date Due  . COVID-19 Vaccine (1) Never done  . TETANUS/TDAP  06/11/2018  . INFLUENZA VACCINE  06/29/2020  . DEXA SCAN  11/18/2020 (Originally 01/02/1995)  . PNA vac Low Risk Adult  Completed    Health Maintenance  Health Maintenance Due  Topic Date Due  . COVID-19 Vaccine (1) Never done  . TETANUS/TDAP  06/11/2018  . INFLUENZA VACCINE  06/29/2020    Colorectal cancer screening: No longer required.  Mammogram status: No longer required.  Bone Density status: Completed postponed 12/21. Results reflect: Bone density results: NORMAL. Repeat every 0 years.    Additional Screening:   Vision Screening: Recommended annual ophthalmology exams for early detection of glaucoma and other disorders of the eye. Is the patient up to date with their annual eye exam?  Yes  Who is the provider or what is the name of the office in which the patient attends annual eye exams? Dr [name] for eye exams  Dental Screening: Recommended annual dental exams for proper oral hygiene  Community Resource Referral / Chronic Care  Management: CRR required this visit?  No   CCM required this visit?  No      Plan:     I have personally reviewed and noted the following in the patient's chart:   . Medical and social history . Use of alcohol, tobacco or illicit drugs  . Current medications and supplements . Functional ability and status . Nutritional status . Physical activity . Advanced directives . List of other physicians . Hospitalizations, surgeries, and ER visits in previous 12 months . Vitals . Screenings to include cognitive, depression, and falls . Referrals and appointments  In addition, I have reviewed and discussed with patient certain preventive protocols, quality metrics, and best practice recommendations. A written personalized care plan for preventive services as well as general preventive health recommendations were provided to patient.     Willette Brace, LPN   12/04/1094   Nurse Notes: None

## 2020-07-10 ENCOUNTER — Encounter: Payer: Self-pay | Admitting: Family Medicine

## 2020-07-10 ENCOUNTER — Other Ambulatory Visit: Payer: Self-pay

## 2020-07-10 ENCOUNTER — Ambulatory Visit (INDEPENDENT_AMBULATORY_CARE_PROVIDER_SITE_OTHER): Payer: Medicare Other | Admitting: Family Medicine

## 2020-07-10 VITALS — BP 140/62 | HR 65 | Temp 97.0°F | Ht 61.0 in | Wt 93.0 lb

## 2020-07-10 DIAGNOSIS — E038 Other specified hypothyroidism: Secondary | ICD-10-CM | POA: Diagnosis not present

## 2020-07-10 DIAGNOSIS — R600 Localized edema: Secondary | ICD-10-CM

## 2020-07-10 DIAGNOSIS — R634 Abnormal weight loss: Secondary | ICD-10-CM

## 2020-07-10 NOTE — Assessment & Plan Note (Signed)
Up about 4 pounds since last visit.  Encouraged continued diet high in protein and fat.  Follow-up in 3 months.

## 2020-07-10 NOTE — Assessment & Plan Note (Signed)
Stable.  Discussed chronic venous stasis changes and reassured patient today.  Continue conservative management.

## 2020-07-10 NOTE — Patient Instructions (Signed)
It was very nice to see you today!  No changes today.  Please continue stay well-hydrated.  I will see you back in 3 months.  Please come back to see me sooner if needed.  Take care, Dr Jerline Pain  Please try these tips to maintain a healthy lifestyle:   Eat at least 3 REAL meals and 1-2 snacks per day.  Aim for no more than 5 hours between eating.  If you eat breakfast, please do so within one hour of getting up.    Each meal should contain half fruits/vegetables, one quarter protein, and one quarter carbs (no bigger than a computer mouse)   Cut down on sweet beverages. This includes juice, soda, and sweet tea.     Drink at least 1 glass of water with each meal and aim for at least 8 glasses per day   Exercise at least 150 minutes every week.

## 2020-07-10 NOTE — Assessment & Plan Note (Signed)
Last TSH at goal.  Continue Synthroid 100 mcg daily.

## 2020-07-10 NOTE — Progress Notes (Signed)
   Holly [name] is a 86 y.o. female who presents today for an office visit.  Assessment/Plan:  Chronic Problems Addressed Today: Weight loss Up about 4 pounds since last visit.  Encouraged continued diet high in protein and fat.  Follow-up in 3 months.  Hypothyroidism Last TSH at goal.  Continue Synthroid 100 mcg daily.  Leg edema Stable.  Discussed chronic venous stasis changes and reassured patient today.  Continue conservative management.     Subjective:  HPI:  See A/p.         Objective:  Physical Exam: BP 140/62   Pulse 65   Temp (!) 97 F (36.1 C)   Ht 5\' 1"  (1.549 m)   Wt 93 lb (42.2 kg)   SpO2 99%   BMI 17.57 kg/m   Wt Readings from Last 3 Encounters:  07/10/20 93 lb (42.2 kg)  06/05/20 89 lb 6.1 oz (40.5 kg)  05/02/20 89 lb (40.4 kg)    Gen: No acute distress, resting comfortably CV: Regular rate and rhythm with no murmurs appreciated Pulm: Normal work of breathing, clear to auscultation bilaterally with no crackles, wheezes, or rhonchi Neuro: Grossly normal, moves all extremities Psych: Normal affect and thought content      Eli Pattillo M. Jerline Pain, MD 07/10/2020 2:52 PM

## 2020-07-11 DIAGNOSIS — H542X22 Low vision right eye category 2, low vision left eye category 2: Secondary | ICD-10-CM | POA: Diagnosis not present

## 2020-07-11 DIAGNOSIS — H53411 Scotoma involving central area, right eye: Secondary | ICD-10-CM | POA: Diagnosis not present

## 2020-07-17 DIAGNOSIS — H53411 Scotoma involving central area, right eye: Secondary | ICD-10-CM | POA: Diagnosis not present

## 2020-07-17 DIAGNOSIS — H542X22 Low vision right eye category 2, low vision left eye category 2: Secondary | ICD-10-CM | POA: Diagnosis not present

## 2020-07-21 DIAGNOSIS — H53411 Scotoma involving central area, right eye: Secondary | ICD-10-CM | POA: Diagnosis not present

## 2020-07-21 DIAGNOSIS — H542X22 Low vision right eye category 2, low vision left eye category 2: Secondary | ICD-10-CM | POA: Diagnosis not present

## 2020-07-28 DIAGNOSIS — S22080A Wedge compression fracture of T11-T12 vertebra, initial encounter for closed fracture: Secondary | ICD-10-CM | POA: Diagnosis not present

## 2020-08-01 DIAGNOSIS — H53411 Scotoma involving central area, right eye: Secondary | ICD-10-CM | POA: Diagnosis not present

## 2020-08-01 DIAGNOSIS — H542X22 Low vision right eye category 2, low vision left eye category 2: Secondary | ICD-10-CM | POA: Diagnosis not present

## 2020-09-12 ENCOUNTER — Other Ambulatory Visit: Payer: Self-pay | Admitting: Family Medicine

## 2020-09-12 NOTE — Telephone Encounter (Signed)
MEDICATION: Synthroid 100 mcg  PHARMACY: [facility] [address]  Comments:   **Let patient know to contact pharmacy at the end of the day to make sure medication is ready. **  ** Please notify patient to allow 48-72 hours to process**  **Encourage patient to contact the pharmacy for refills or they can request refills through [facility]**

## 2020-10-10 ENCOUNTER — Encounter: Payer: Self-pay | Admitting: Family Medicine

## 2020-10-10 ENCOUNTER — Ambulatory Visit (INDEPENDENT_AMBULATORY_CARE_PROVIDER_SITE_OTHER): Payer: Medicare Other | Admitting: Family Medicine

## 2020-10-10 ENCOUNTER — Other Ambulatory Visit: Payer: Self-pay

## 2020-10-10 VITALS — BP 146/74 | HR 58 | Temp 97.2°F | Ht 61.0 in | Wt 92.2 lb

## 2020-10-10 DIAGNOSIS — Z23 Encounter for immunization: Secondary | ICD-10-CM

## 2020-10-10 DIAGNOSIS — J449 Chronic obstructive pulmonary disease, unspecified: Secondary | ICD-10-CM

## 2020-10-10 DIAGNOSIS — R634 Abnormal weight loss: Secondary | ICD-10-CM

## 2020-10-10 DIAGNOSIS — R002 Palpitations: Secondary | ICD-10-CM | POA: Diagnosis not present

## 2020-10-10 DIAGNOSIS — J310 Chronic rhinitis: Secondary | ICD-10-CM

## 2020-10-10 DIAGNOSIS — D692 Other nonthrombocytopenic purpura: Secondary | ICD-10-CM | POA: Diagnosis not present

## 2020-10-10 DIAGNOSIS — I6359 Cerebral infarction due to unspecified occlusion or stenosis of other cerebral artery: Secondary | ICD-10-CM

## 2020-10-10 MED ORDER — ALBUTEROL SULFATE HFA 108 (90 BASE) MCG/ACT IN AERS: 1.0000 | INHALATION_SPRAY | Freq: Four times a day (QID) | RESPIRATORY_TRACT | 5 refills | Status: DC | PRN

## 2020-10-10 MED ORDER — ATORVASTATIN CALCIUM 40 MG PO TABS: 40.0000 mg | ORAL_TABLET | Freq: Every day | ORAL | 3 refills | Status: DC

## 2020-10-10 MED ORDER — AZELASTINE HCL 0.1 % NA SOLN
2.0000 | Freq: Two times a day (BID) | NASAL | 12 refills | Status: DC
Start: 2020-10-10 — End: 2021-02-19

## 2020-10-10 NOTE — Patient Instructions (Signed)
It was very nice to see you today!  We will give you your flu vaccine today.  Please start Astelin for your nose.  Please start aspirin.  Come back in 3 months.  Please come back to see me sooner if needed.  Take care, Dr Jerline Pain  Please try these tips to maintain a healthy lifestyle:   Eat at least 3 REAL meals and 1-2 snacks per day.  Aim for no more than 5 hours between eating.  If you eat breakfast, please do so within one hour of getting up.    Each meal should contain half fruits/vegetables, one quarter protein, and one quarter carbs (no bigger than a computer mouse)   Cut down on sweet beverages. This includes juice, soda, and sweet tea.     Drink at least 1 glass of water with each meal and aim for at least 8 glasses per day   Exercise at least 150 minutes every week.

## 2020-10-10 NOTE — Assessment & Plan Note (Addendum)
Stable at 89.  Continue high calorie diets.

## 2020-10-10 NOTE — Assessment & Plan Note (Signed)
No red flags.  Possibly allergic. Will start astelin.

## 2020-10-10 NOTE — Assessment & Plan Note (Signed)
No red flags. There was concern about PAF in the past. Will restart ASA 81mg  daily. She does not want to be on a blood thinner and does not want further cardiac work up.

## 2020-10-10 NOTE — Progress Notes (Signed)
   Holly [name] is a 86 y.o. female who presents today for an office visit.  Assessment/Plan:  New/Acute Problems: Seborrheic keratosis Reassured patient.  Chronic Problems Addressed Today: Rhinitis No red flags.  Possibly allergic. Will start astelin.   Weight loss Stable at 92.  Continue high calorie diets.  Senile purpura (HCC) Stable. Secondary to OTC analgesics.  Palpitations No red flags. There was concern about PAF in the past. Will restart ASA 81mg  daily. She does not want to be on a blood thinner and does not want further cardiac work up.   CVA (cerebral vascular accident) [facility]) We will restart aspirin.  She has already on Lipitor 40 mg daily.  COPD mixed type ([place]) Stable.  Refill albuterol today.     Subjective:  HPI:  Patient in for follow-up.  See A/P for status of chronic conditions.  Has been doing well.  Had one episode of palpitations recently but subsided.  She only notices it because she was checking her pulse.  No reported chest pain or shortness of breath.       Objective:  Physical Exam: BP (!) 146/74   Pulse (!) 58   Temp (!) 97.2 F (36.2 C) (Temporal)   Ht 5\' 1"  (1.549 m)   Wt 92 lb 3.2 oz (41.8 kg)   SpO2 96%   BMI 17.42 kg/m   Wt Readings from Last 3 Encounters:  10/10/20 92 lb 3.2 oz (41.8 kg)  07/10/20 93 lb (42.2 kg)  06/05/20 89 lb 6.1 oz (40.5 kg)    Gen: No acute distress, resting comfortably CV: Regular rate and rhythm with no murmurs appreciated Pulm: Normal work of breathing, clear to auscultation bilaterally with no crackles, wheezes, or rhonchi Skin: Several scattered seborrheic keratosis noted. Neuro: Grossly normal, moves all extremities Psych: Normal affect and thought content      Demani Mcbrien M. Jerline Pain, MD 10/10/2020 2:08 PM

## 2020-10-10 NOTE — Assessment & Plan Note (Signed)
Stable.  Refill albuterol today.

## 2020-10-10 NOTE — Assessment & Plan Note (Signed)
Stable. Secondary to OTC analgesics.

## 2020-10-10 NOTE — Assessment & Plan Note (Signed)
We will restart aspirin.  She has already on Lipitor 40 mg daily.

## 2021-01-09 ENCOUNTER — Ambulatory Visit: Payer: Medicare Other | Admitting: Family Medicine

## 2021-02-02 ENCOUNTER — Encounter: Payer: Self-pay | Admitting: Family Medicine

## 2021-02-02 ENCOUNTER — Other Ambulatory Visit: Payer: Self-pay | Admitting: *Deleted

## 2021-02-02 MED ORDER — FLUTICASONE-SALMETEROL 500-50 MCG/DOSE IN AEPB: 1.0000 | INHALATION_SPRAY | Freq: Two times a day (BID) | RESPIRATORY_TRACT | 1 refills | Status: DC

## 2021-02-02 MED ORDER — ALBUTEROL SULFATE HFA 108 (90 BASE) MCG/ACT IN AERS: 1.0000 | INHALATION_SPRAY | Freq: Four times a day (QID) | RESPIRATORY_TRACT | 5 refills | Status: DC | PRN

## 2021-02-05 ENCOUNTER — Telehealth: Payer: Self-pay

## 2021-02-05 ENCOUNTER — Other Ambulatory Visit: Payer: Self-pay | Admitting: *Deleted

## 2021-02-05 ENCOUNTER — Emergency Department (HOSPITAL_COMMUNITY): Payer: Medicare Other

## 2021-02-05 ENCOUNTER — Other Ambulatory Visit: Payer: Self-pay

## 2021-02-05 ENCOUNTER — Emergency Department (HOSPITAL_COMMUNITY)
Admission: EM | Admit: 2021-02-05 | Discharge: 2021-02-05 | Disposition: A | Discharge status: Home / Self Care | Payer: Medicare Other | Attending: Emergency Medicine | Admitting: Emergency Medicine

## 2021-02-05 DIAGNOSIS — Z8673 Personal history of transient ischemic attack (TIA), and cerebral infarction without residual deficits: Secondary | ICD-10-CM | POA: Diagnosis not present

## 2021-02-05 DIAGNOSIS — R0602 Shortness of breath: Secondary | ICD-10-CM | POA: Diagnosis not present

## 2021-02-05 DIAGNOSIS — I2510 Atherosclerotic heart disease of native coronary artery without angina pectoris: Secondary | ICD-10-CM | POA: Diagnosis not present

## 2021-02-05 DIAGNOSIS — Z20822 Contact with and (suspected) exposure to covid-19: Secondary | ICD-10-CM | POA: Insufficient documentation

## 2021-02-05 DIAGNOSIS — E039 Hypothyroidism, unspecified: Secondary | ICD-10-CM | POA: Insufficient documentation

## 2021-02-05 DIAGNOSIS — Z8616 Personal history of COVID-19: Secondary | ICD-10-CM | POA: Insufficient documentation

## 2021-02-05 DIAGNOSIS — R011 Cardiac murmur, unspecified: Secondary | ICD-10-CM | POA: Diagnosis not present

## 2021-02-05 DIAGNOSIS — Z87891 Personal history of nicotine dependence: Secondary | ICD-10-CM | POA: Diagnosis not present

## 2021-02-05 DIAGNOSIS — J439 Emphysema, unspecified: Secondary | ICD-10-CM | POA: Diagnosis not present

## 2021-02-05 DIAGNOSIS — J441 Chronic obstructive pulmonary disease with (acute) exacerbation: Secondary | ICD-10-CM | POA: Diagnosis not present

## 2021-02-05 DIAGNOSIS — Z79899 Other long term (current) drug therapy: Secondary | ICD-10-CM | POA: Insufficient documentation

## 2021-02-05 LAB — COMPREHENSIVE METABOLIC PANEL
ALT: 18 U/L (ref 0–44)
AST: 34 U/L (ref 15–41)
Albumin: 3.6 g/dL (ref 3.5–5.0)
Alkaline Phosphatase: 73 U/L (ref 38–126)
Anion gap: 9 (ref 5–15)
BUN: 14 mg/dL (ref 8–23)
CO2: 25 mmol/L (ref 22–32)
Calcium: 9.1 mg/dL (ref 8.9–10.3)
Chloride: 103 mmol/L (ref 98–111)
Creatinine, Ser: 0.92 mg/dL (ref 0.44–1.00)
GFR, Estimated: 59 mL/min — ABNORMAL LOW (ref >60)
Glucose, Bld: 98 mg/dL (ref 70–99)
Potassium: 4.2 mmol/L (ref 3.5–5.1)
Sodium: 137 mmol/L (ref 135–145)
Total Bilirubin: 0.9 mg/dL (ref 0.3–1.2)
Total Protein: 6.8 g/dL (ref 6.5–8.1)

## 2021-02-05 LAB — CBC
HCT: 41.1 % (ref 36.0–46.0)
Hemoglobin: 13.4 g/dL (ref 12.0–15.0)
MCH: 31.6 pg (ref 26.0–34.0)
MCHC: 32.6 g/dL (ref 30.0–36.0)
MCV: 96.9 fL (ref 80.0–100.0)
Platelets: 210 10*3/uL (ref 150–400)
RBC: 4.24 MIL/uL (ref 3.87–5.11)
RDW: 14.8 % (ref 11.5–15.5)
WBC: 5.5 10*3/uL (ref 4.0–10.5)
nRBC: 0 % (ref 0.0–0.2)

## 2021-02-05 LAB — SARS CORONAVIRUS 2 (TAT 6-24 HRS): SARS Coronavirus 2: NEGATIVE

## 2021-02-05 MED ORDER — PREDNISONE 20 MG PO TABS
40.0000 mg | ORAL_TABLET | Freq: Once | ORAL | Status: AC
  Administered 2021-02-05: 40 mg via ORAL
  Filled 2021-02-05: qty 2

## 2021-02-05 MED ORDER — AZITHROMYCIN 250 MG PO TABS
500.0000 mg | ORAL_TABLET | Freq: Once | ORAL | Status: AC
  Administered 2021-02-05: 500 mg via ORAL
  Filled 2021-02-05: qty 2

## 2021-02-05 MED ORDER — PREDNISONE 10 MG PO TABS: 40.0000 mg | ORAL_TABLET | Freq: Every day | ORAL | 0 refills | Status: DC

## 2021-02-05 MED ORDER — ALBUTEROL SULFATE HFA 108 (90 BASE) MCG/ACT IN AERS
2.0000 | INHALATION_SPRAY | RESPIRATORY_TRACT | Status: DC | PRN
  Filled 2021-02-05: qty 6.7

## 2021-02-05 MED ORDER — AZITHROMYCIN 250 MG PO TABS: 250.0000 mg | ORAL_TABLET | Freq: Every day | ORAL | 0 refills | Status: DC

## 2021-02-05 MED ORDER — FLUTICASONE-SALMETEROL 500-50 MCG/DOSE IN AEPB: 1.0000 | INHALATION_SPRAY | Freq: Two times a day (BID) | RESPIRATORY_TRACT | 1 refills | Status: DC

## 2021-02-05 NOTE — ED Triage Notes (Signed)
Pt bib family with reports of shob X4 days along with some confusion. Family reports pt is also slower to respond. Pt alert and in NAD.

## 2021-02-05 NOTE — Telephone Encounter (Signed)
Pt at ED.

## 2021-02-05 NOTE — ED Provider Notes (Signed)
[place] EMERGENCY DEPARTMENT Provider Note   CSN: [id number] Arrival date & time: 02/05/21  1206     History Chief Complaint  Patient presents with  . Shortness of Breath  . Altered Mental Status    Holly [name] is a 87 y.o. female.  HPI 87 year old female presents with shortness of breath.  History is from patient and son.  Patient has been having some dyspnea for couple weeks but much worse over the last few days.  She was prescribed albuterol by her PCP and this does seem to transiently help.  Has had a cough that was mostly dry though today is now yellow.  No fevers or chest pain.  No leg swelling.  Son has been living with patient for the last couple months and notices that she seems to be a little slower to respond over these last couple months.  Seems like she is taking but is not really altered.   Past Medical History:  Diagnosis Date  . CAD (coronary artery disease)   . CELLULITIS 06/18/2008  . CHF (congestive heart failure) ([place])   . CHRONIC OBSTRUCTIVE PULMONARY DISEASE, ACUTE EXACERBATION 11/16/2007  . COPD 05/18/2007  . COVID-19   . GERD 12/25/2007  . HYPERLIPIDEMIA 05/18/2007  . HYPOTHYROIDISM 05/18/2007  . Macular degeneration   . Stroke ([place]) 08/2018  . Urosepsis     Patient Active Problem List   Diagnosis Date Noted  . Rhinitis 10/10/2020  . Closed wedge compression fracture of T11 vertebra ([place]) 05/02/2020  . Weight loss 11/19/2019  . Mild cognitive impairment 07/06/2019  . Leg edema 11/13/2018  . Senile purpura ([place]) 09/07/2018  . Macular degeneration 09/05/2018  . Palpitations 09/05/2018  . CVA (cerebral vascular accident) ([place]) 08/30/2018  . GERD 12/25/2007  . Hypothyroidism 05/18/2007  . Dyslipidemia 05/18/2007  . COPD mixed type ([place]) 05/18/2007    Past Surgical History:  Procedure Laterality Date  . CATARACT EXTRACTION    . DILATION AND CURETTAGE OF UTERUS    . KYPHOPLASTY N/A 05/02/2020   Procedure: Thoracic eleven  KYPHOPLASTY;  Surgeon: Ashok Pall, MD;  Location: [facility];  Service: Neurosurgery;  Laterality: N/A;  3C  . TEE WITHOUT CARDIOVERSION N/A 09/01/2018   Procedure: TRANSESOPHAGEAL ECHOCARDIOGRAM (TEE);  Surgeon: Buford Dresser, MD;  Location: [facility] ENDOSCOPY;  Service: Cardiovascular;  Laterality: N/A;  . VARICOSE VEIN SURGERY       OB History   No obstetric history on file.     Family History  Problem Relation Age of Onset  . Ovarian cancer Mother   . Diabetes Mellitus II Brother     Social History   Tobacco Use  . Smoking status: Former Smoker    Years: 40.00    Quit date: 11/29/1985    Years since quitting: 35.2  . Smokeless tobacco: Never Used  Vaping Use  . Vaping Use: Never used  Substance Use Topics  . Alcohol use: No  . Drug use: No    Home Medications Prior to Admission medications   Medication Sig Start Date End Date Taking? Authorizing Provider  azithromycin (ZITHROMAX) 250 MG tablet Take 1 tablet (250 mg total) by mouth daily. 02/06/21  Yes Sherwood Gambler, MD  predniSONE (DELTASONE) 10 MG tablet Take 4 tablets (40 mg total) by mouth daily. 02/06/21  Yes Sherwood Gambler, MD  acetaminophen (TYLENOL) 500 MG tablet Take 500 mg by mouth every 6 (six) hours as needed for mild pain.     [provider]  albuterol (PROVENTIL  HFA) 108 (90 Base) MCG/ACT inhaler Inhale 1-2 puffs into the lungs every 6 (six) hours as needed for wheezing or shortness of breath. 02/02/21   Vivi Barrack, MD  atorvastatin (LIPITOR) 40 MG tablet Take 1 tablet (40 mg total) by mouth daily. 10/10/20   Vivi Barrack, MD  azelastine (ASTELIN) 0.1 % nasal spray Place 2 sprays into both nostrils 2 (two) times daily. 10/10/20   Vivi Barrack, MD  Fluticasone-Salmeterol [facility] INHUB) 500-50 MCG/DOSE AEPB Inhale 1 puff into the lungs 2 (two) times daily. Advair 02/05/21 05/06/21  Vivi Barrack, MD  levothyroxine (SYNTHROID) 100 MCG tablet TAKE 1 TABLET BY MOUTH EVERY DAY 09/14/20   Vivi Barrack, MD  Multiple Vitamins-Minerals (ICAPS PO) Take 1 capsule by mouth 2 (two) times daily.    [provider]    Allergies    Levofloxacin, Macrobid [nitrofurantoin macrocrystal], Sulfamethoxazole, Tramadol, and Amoxicillin  Review of Systems   Review of Systems  Constitutional: Negative for fever.  Respiratory: Positive for cough and shortness of breath. Negative for wheezing.   Cardiovascular: Negative for chest pain and leg swelling.  All other systems reviewed and are negative.   Physical Exam Updated Vital Signs BP 137/89 (BP Location: Right Arm)   Pulse 79   Temp 97.7 F (36.5 C) (Oral)   Resp (!) 21   Ht 5\' 2"  (0.240 m)   Wt 39 kg   SpO2 98%   BMI 15.73 kg/m   Physical Exam Vitals and nursing note reviewed.  Constitutional:      Appearance: She is well-developed.  HENT:     Head: Normocephalic and atraumatic.     Right Ear: External ear normal.     Left Ear: External ear normal.     Nose: Nose normal.  Eyes:     General:        Right eye: No discharge.        Left eye: No discharge.  Cardiovascular:     Rate and Rhythm: Normal rate and regular rhythm.     Heart sounds: Murmur heard.    Pulmonary:     Effort: Pulmonary effort is normal. Tachypnea present. No accessory muscle usage or respiratory distress.     Breath sounds: Wheezing (diffuse, mild) present.  Abdominal:     Palpations: Abdomen is soft.     Tenderness: There is no abdominal tenderness.  Musculoskeletal:     Right lower leg: No edema.     Left lower leg: No edema.  Skin:    General: Skin is warm and dry.  Neurological:     Mental Status: She is alert and oriented to person, place, and time.     Comments: CN 3-12 grossly intact. 5/5 strength in all 4 extremities. Grossly normal sensation.  Psychiatric:        Mood and Affect: Mood is not anxious.     ED Results / Procedures / Treatments   Labs (all labs ordered are listed, but only abnormal results are  displayed) Labs Reviewed  COMPREHENSIVE METABOLIC PANEL - Abnormal; Notable for the following components:      Result Value   GFR, Estimated 59 (*)    All other components within normal limits  SARS CORONAVIRUS 2 (TAT 6-24 HRS)  CBC    EKG EKG Interpretation  Date/Time:  Thursday February 05 2021 12:48:10 EST Ventricular Rate:  83 PR Interval:  130 QRS Duration: 122 QT Interval:  390 QTC Calculation: 458 R Axis:   -  69 Text Interpretation: Sinus rhythm with Premature atrial complexes Right bundle branch block Left anterior fascicular block ** Bifascicular block ** Abnormal ECG When compared to prior, faster rate. No STEMI Confirmed by Antony Blackbird [phone]) on 02/05/2021 3:27:57 PM   Radiology DG Chest 2 View  Result Date: 02/05/2021 CLINICAL DATA:  Shortness of breath. EXAM: CHEST - 2 VIEW COMPARISON:  PA and lateral chest 12/01/2019. FINDINGS: The chest is hyperexpanded but the lungs are clear. Heart size is normal. Aortic atherosclerosis. No acute or focal bony abnormality. The patient has undergone vertebral augmentation of a lower thoracic vertebral body since the prior exam. IMPRESSION: No acute disease. Aortic Atherosclerosis (ICD10-I70.0) and Emphysema (ICD10-J43.9). Electronically Signed   By: Inge Rise M.D.   On: 02/05/2021 13:33    Procedures Procedures   Medications Ordered in ED Medications  albuterol (VENTOLIN HFA) 108 (90 Base) MCG/ACT inhaler 2 puff (2 puffs Inhalation Not Given 02/05/21 1535)  predniSONE (DELTASONE) tablet 40 mg (40 mg Oral Given 02/05/21 1624)  azithromycin (ZITHROMAX) tablet 500 mg (500 mg Oral Given 02/05/21 1623)    ED Course  I have reviewed the triage vital signs and the nursing notes.  Pertinent labs & imaging results that were available during my care of the patient were reviewed by me and considered in my medical decision making (see chart for details).    MDM Rules/Calculators/A&P                          Patient presents with  what sounds like a COPD exacerbation.  She is feeling better after taking 2 puffs of her own albuterol here in the emergency department.  Given the change in sputum production as well as increased cough and shortness of breath, I will prescribe antibiotics in addition to a burst of steroids.  I will test her for COVID, but this sounds like more generic COPD exacerbation.  Low suspicion for ACS, PE, bacterial pneumonia, CHF exacerbation.  Given return precautions. Final Clinical Impression(s) / ED Diagnoses Final diagnoses:  COPD exacerbation ([place])    Rx / DC Orders ED Discharge Orders         Ordered    predniSONE (DELTASONE) 10 MG tablet  Daily        02/05/21 1642    azithromycin (ZITHROMAX) 250 MG tablet  Daily        02/05/21 1642           Sherwood Gambler, MD 02/05/21 1648

## 2021-02-05 NOTE — Telephone Encounter (Signed)
Nurse Assessment Nurse: Thad Ranger RN, Langley Gauss Date/Time (Eastern Time): 02/05/2021 9:34:46 AM Confirm and document reason for call. If symptomatic, describe symptoms. ---Caller reports that her grandmother is having shortness of breath today. She also reports bout of being incoherent. Pt is NOT with the caller at this time. Does the patient have any new or worsening symptoms? ---Yes Will a triage be completed? ---No Select reason for no triage. ---Other Please document clinical information provided and list any resource used. ---Advised unable to assess/triage w/o speaking to the pt. Based on RN clinical knowledge, advised the pt needs to be seen in ER immed and if she is incoherent, call 911. Disp. Time Eilene Ghazi Time) Disposition Final User 02/05/2021 9:29:30 AM Send to Urgent Marlyce Huge 02/05/2021 9:36:19 AM Clinical Call Yes Carmon, RN, Sharla Kidney

## 2021-02-05 NOTE — Discharge Instructions (Signed)
Use your albuterol inhaler 2 puffs every 4 hours as needed for cough or shortness of breath.  If you develop fever, coughing up blood, chest pain, new or worsening shortness of breath, or any other new/concerning symptoms then return to the ER for evaluation.  Start your antibiotics and steroid prescriptions tomorrow, you were given your first dose today.

## 2021-02-11 ENCOUNTER — Ambulatory Visit (INDEPENDENT_AMBULATORY_CARE_PROVIDER_SITE_OTHER): Payer: Medicare Other | Admitting: Adult Health

## 2021-02-11 ENCOUNTER — Encounter: Payer: Self-pay | Admitting: Adult Health

## 2021-02-11 ENCOUNTER — Telehealth: Payer: Self-pay | Admitting: Adult Health

## 2021-02-11 VITALS — BP 109/58 | HR 75 | Ht 62.0 in | Wt 88.6 lb

## 2021-02-11 DIAGNOSIS — I639 Cerebral infarction, unspecified: Secondary | ICD-10-CM

## 2021-02-11 DIAGNOSIS — I671 Cerebral aneurysm, nonruptured: Secondary | ICD-10-CM | POA: Diagnosis not present

## 2021-02-11 MED ORDER — ASPIRIN EC 81 MG PO TBEC: 81.0000 mg | DELAYED_RELEASE_TABLET | Freq: Every day | ORAL | 11 refills | Status: DC

## 2021-02-11 NOTE — Progress Notes (Signed)
[facility] [address] [place]. [place] [address] [phone]       OFFICE FOLLOW-UP NOTE  Holly [name] Date of Birth:  [birth year] Medical Record Number:  [id number]  [facility] provider: Dr. Pearlean Brownie    Chief complaint: Chief Complaint  Patient presents with  . Follow-up    Doing ok, seen in ED for respiratory issue, finished Zpak, and is on prednisone.       HPI:   Today, 02/11/2021, Holly [name] returns for 1 year stroke follow-up accompanied by her son, [name] since prior visit without new recurrent stroke/TIA symptoms Despite recommendation at prior visit, she again discontinued aspirin as she was told by her ophthalmologist that she should not be on this medication -this was discussed at prior visit and was advised to restart aspirin after further discussion with her ophthalmologist -highly encourage restarting Reports compliance on atorvastatin -denies side effects Blood pressure today 109/58 -monitors at home and typically stable  Recently seen in ED for likely COPD exacerbation  No further concerns at this time    History provided for reference purposes only Update 02/12/2020 JM: Holly [name] is a 87 year old female who is being seen today, 02/12/2020, for stroke follow-up accompanied by her granddaughter.  She has been stable from a stroke standpoint without residual deficits, new or reoccurring stroke/TIA symptoms.  After prior visit, patient discontinued aspirin due to recommendations by ophthalmologist due to history of macular degeneration and she was advised to restart aspirin for secondary stroke prevention after speaking with ophthalmologist but has not done so at this time.  Continues on atorvastatin with myalgias.  Blood pressure today initially elevated at 157/75 but on recheck 122/52.  Of note, she was Covid positive in January but recovered well without residual symptoms.  No concerns at this time.  Update 08/01/2019 JM: Holly [name] is a  87 year old female who is being seen today for stroke follow-up.  She has been doing well from a stroke standpoint.  She continues to live with her son but is able to maintain ADLs independently.  She did undergo a 30-day cardiac event monitor which was unremarkable for atrial fibrillation.  Discussion with Dr. Ladona Ridgel, cardiology, in regards to placement of ILR but she declined placement.  She discontinued aspirin 81 mg due to bruises after bumping her legs and reports she was told by her prior ophthalmologist that she should discontinue aspirin due to risk of bleeding in her eyes and history of macular degeneration.  Son has attempted to schedule follow-up visit with ophthalmology as prior visit canceled due to COVID-19 but he has not been able to schedule a follow-up visit at this time.  He is requesting switching ophthalmologist from [facility] to [facility].  She has continued on atorvastatin without myalgias.  Recent lipid panel on 07/06/2019 showed LDL 53.  Blood pressure today 147/70.  No further concerns at this time.  Initial visit 10/03/2018 PS: Holly [name] is a 87 year old pleasant Caucasian lady seen today for initial office follow-up visit following hospital admission for stroke.  She is accompanied by her daughter [name].  History is obtained from them and review of electronic medical records.  I personally reviewed imaging films.HPI ( Dr Laurence Slate ) ;Holly [name] is an 87 y.o. female  with PMH of CHF, CAD, hypthyorodism, HLD presents with transient episode of slurred speech and gait instability that occurred around 6 PM yesterday evening witnessed by her son.  Patient's son insisted she come to the hospital  and evaluated today in the ER.  MRI brain shows small acute infarcts in the right parietal lobe and insula.  She was admitted for stroke work-up and neurology was consulted.  Patient not on ASA due to advice from her ophthalmologist for macular degeneration?  She also states that she had some  palpitations/quivering in her neck prior to this episode. Date last known well: 10.1.19 Time last known well: 6pm tPA Given: no, outside window NIHSS: 0 Baseline MRS 0  .  CT scan of the head showed diffuse atrophy and white matter changes no acute abnormality.  MRI scan of the brain showed 2 tiny right insular cortex and right parietal embolic infarcts.  MRI of the brain showed no large vessel stenosis but small 1 to 2 mm left anterior communicating artery aneurysm.  LDL cholesterol was 52 mg percent.  Hemoglobin A1c was 5.2.  Carotid ultrasound showed no extracranial stenosis.  Transthoracic echo showed normal ejection fraction and transesophageal echo showed no definite cardiac source of embolism.  Cardiology was consulted for loop recorder placement but given patient's history of palpitations the night prior to the stroke decided to do a 30-day external heart monitor instead.  Patient is still wearing a heart monitor and so far proximal A. fib has not yet been found.  She is states she is done well since discharge.  Her speech and balance and walking have improved.  She is on aspirin 81 mg daily which is tolerating well with only minor bruising.  She is still quite independent and does all activities of daily living by herself.  She has no new complaints today.  Her blood pressure is apparently well controlled today it is 117/68.     ROS:   14 system review of systems is positive for no complaints and all other systems negative   PMH:  Past Medical History:  Diagnosis Date  . CAD (coronary artery disease)   . CELLULITIS 06/18/2008  . CHF (congestive heart failure) (HCC)   . CHRONIC OBSTRUCTIVE PULMONARY DISEASE, ACUTE EXACERBATION 11/16/2007  . COPD 05/18/2007  . COVID-19   . GERD 12/25/2007  . HYPERLIPIDEMIA 05/18/2007  . HYPOTHYROIDISM 05/18/2007  . Macular degeneration   . Stroke (HCC) 08/2018  . Urosepsis     Social History:  Social History   Socioeconomic History  . Marital  status: Widowed    Spouse name: Not on file  . Number of children: Not on file  . Years of education: Not on file  . Highest education level: Not on file  Occupational History  . Occupation: Retired  Tobacco Use  . Smoking status: Former Smoker    Years: 40.00    Quit date: 11/29/1985    Years since quitting: 35.2  . Smokeless tobacco: Never Used  Vaping Use  . Vaping Use: Never used  Substance and Sexual Activity  . Alcohol use: No  . Drug use: No  . Sexual activity: Not on file  Other Topics Concern  . Not on file  Social History Narrative  . Not on file   Social Determinants of Health   Financial Resource Strain: Low Risk   . Difficulty of Paying Living Expenses: Not hard at all  Food Insecurity: No Food Insecurity  . Worried About Programme researcher, broadcasting/film/video in the Last Year: Never true  . Ran Out of Food in the Last Year: Never true  Transportation Needs: No Transportation Needs  . Lack of Transportation (Medical): No  . Lack of Transportation (Non-Medical):  No  Physical Activity: Insufficiently Active  . Days of Exercise per Week: 5 days  . Minutes of Exercise per Session: 10 min  Stress: Not on file  Social Connections: Socially Isolated  . Frequency of Communication with Friends and Family: More than three times a week  . Frequency of Social Gatherings with Friends and Family: Twice a week  . Attends Religious Services: Never  . Active Member of Clubs or Organizations: No  . Attends Banker Meetings: Never  . Marital Status: Widowed  Intimate Partner Violence: Not on file    Medications:   Current Outpatient Medications on File Prior to Visit  Medication Sig Dispense Refill  . acetaminophen (TYLENOL) 500 MG tablet Take 500 mg by mouth every 6 (six) hours as needed for mild pain.     Marland Kitchen albuterol (PROVENTIL HFA) 108 (90 Base) MCG/ACT inhaler Inhale 1-2 puffs into the lungs every 6 (six) hours as needed for wheezing or shortness of breath. 20.1 g 5  .  atorvastatin (LIPITOR) 40 MG tablet Take 1 tablet (40 mg total) by mouth daily. 90 tablet 3  . azelastine (ASTELIN) 0.1 % nasal spray Place 2 sprays into both nostrils 2 (two) times daily. 30 mL 12  . Fluticasone-Salmeterol (WIXELA INHUB) 500-50 MCG/DOSE AEPB Inhale 1 puff into the lungs 2 (two) times daily. Advair 180 each 1  . levothyroxine (SYNTHROID) 100 MCG tablet TAKE 1 TABLET BY MOUTH EVERY DAY 90 tablet 1  . Multiple Vitamins-Minerals (ICAPS PO) Take 1 capsule by mouth 2 (two) times daily.    . predniSONE (DELTASONE) 10 MG tablet Take 4 tablets (40 mg total) by mouth daily. 8 tablet 0   No current facility-administered medications on file prior to visit.    Allergies:   Allergies  Allergen Reactions  . Levofloxacin     REACTION: unspecified  . Macrobid [Nitrofurantoin Macrocrystal] Nausea Only  . Sulfamethoxazole     REACTION: unspecified  . Tramadol Other (See Comments)    Shakes   . Amoxicillin Rash    Has patient had a PCN reaction causing immediate rash, facial/tongue/throat swelling, SOB or lightheadedness with hypotension: No Has patient had a PCN reaction causing severe rash involving mucus membranes or skin necrosis: No Has patient had a PCN reaction that required hospitalization: No Has patient had a PCN reaction occurring within the last 10 years: No If all of the above answers are "NO", then may proceed with Cephalosporin use.   Made "bottom area" very raw    Today's Vitals   02/11/21 1043  BP: (!) 109/58  Pulse: 75  Weight: 88 lb 9.6 oz (40.2 kg)  Height: 5\' 2"  (1.575 m)   Body mass index is 16.21 kg/m.   Physical Exam General: Frail very pleasant elderly Caucasian lady seated, in no evident distress Head: head normocephalic and atraumatic.  Neck: supple with no carotid or supraclavicular bruits Cardiovascular: regular rate and rhythm, no murmurs Musculoskeletal: no deformity Skin:  no rash/petichiae Vascular:  Normal pulses all  extremities  Neurologic Exam Mental Status: Awake and fully alert.  Normal speech and language.  Oriented to place and time. Recent memory impaired and remote memory intact. Attention span, concentration and fund of knowledge mostly appropriate with granddaughter needing to provide some information. Mood and affect appropriate.  Cranial Nerves: Pupils equal, briskly reactive to light. Extraocular movements full without nystagmus. Visual fields full to confrontation. Hearing intact. Facial sensation intact.  Face, tongue, palate moves normally and symmetrically.  Motor: Normal bulk  and tone. Normal strength in all tested extremity muscles  Sensory.: intact to touch ,pinprick .position and vibratory sensation.  Coordination: Rapid alternating movements normal in all extremities. Finger-to-nose and heel-to-shin performed accurately bilaterally. Gait and Station: Arises from chair without difficulty. Stance is slightly hunched. Gait demonstrates normal stride length and balance without need of assistive device Reflexes: 1+ and symmetric. Toes downgoing.     ASSESSMENT/PLAN: 87 year old Caucasian lady with right MCA embolic infarction October 2019 of cryptogenic etiology.  30-day cardiac event monitor negative for atrial fibrillation.  Patient declined ILR placement.  Vascular risk factors of age and hyperlipidemia only.  Incidental finding of 1 to 2 mm left A-comm aneurysm in 2019.       Hx R MCA stroke -No residual deficits -Highly encourage restarting aspirin 81 mg daily and continued atorvastatin for secondary stroke prevention -Discussed secondary stroke prevention measures and importance of close PCP follow-up for aggressive stroke risk factor management -HLD: LDL goal<70.  On atorvastatin 40 mg daily per PCP.  LDL 53 on 07/06/2019 - needs to be repeated -not currently fasting but plans on following up with PCP in the near future for repeat lab work  Left A-comm aneurysm -Obtain CTA head for  further evaluation -if continued no evidence of aneurysms, no indication for continued surveillance monitoring in the future -MRA head 03/05/2020 prominent infundibular enlargements at junction of ACA with the right and left A2 segments but no definite aneurysmal dilations are seen -MRA head 08/30/2018 1-58mm L A-comm aneurysm      CC:  [facility] provider: Dr. Magdalene River, Katina Degree, MD    I spent 35 minutes of face-to-face and non-face-to-face time with patient and son.  This included previsit chart review, lab review, study review, order entry, electronic health record documentation, patient and son education and discussion regarding history of prior stroke, importance of recommended medications and routine follow-up with PCP for aggressive stroke risk factor management, indication for repeat imaging for possible aneurysm and answered all other questions to patient and son satisfaction   Ihor Austin, AGNP-BC  [facility] [address] [place], [place] [address]  Phone [phone] Fax [phone] Note: This document was prepared with digital dictation and possible smart phrase technology. Any transcriptional errors that result from this process are unintentional.

## 2021-02-11 NOTE — Telephone Encounter (Signed)
medicare order sent to GI. No auth they will reach out to the patient to schedule.

## 2021-02-11 NOTE — Patient Instructions (Signed)
Your Plan:  Highly encourage restarting aspirin 81 mg daily for secondary stroke prevention as benefit greatly outweigh any type of risk otherwise continue current treatment regimen  You will be called to schedule CT angio for further evaluation of possible aneurysm or vessel irregularity        Thank you for coming to see [place] at [facility]. I hope we have been able to provide you high quality care today.  You may receive a patient satisfaction survey over the next few weeks. We would appreciate your feedback and comments so that we may continue to improve ourselves and the health of our patients.

## 2021-02-15 NOTE — Progress Notes (Signed)
I agree with the above plan 

## 2021-02-19 ENCOUNTER — Other Ambulatory Visit: Payer: Self-pay | Admitting: Family Medicine

## 2021-02-19 MED ORDER — AZELASTINE HCL 0.1 % NA SOLN: 2.0000 | Freq: Two times a day (BID) | NASAL | 12 refills | Status: DC

## 2021-02-19 NOTE — Telephone Encounter (Signed)
Left message for patient to return call.

## 2021-02-19 NOTE — Telephone Encounter (Signed)
Please let patient know it is backordered. We can send to a different pharmacy if needed.

## 2021-02-19 NOTE — Telephone Encounter (Signed)
Called in medication to a different pharmacy per patient.

## 2021-02-19 NOTE — Telephone Encounter (Signed)
Please advise medication, per pharmacy it is on backorder.

## 2021-02-19 NOTE — Addendum Note (Signed)
Addended by: Gean Birchwood on: 02/19/2021 04:45 PM   Modules accepted: Orders

## 2021-02-27 ENCOUNTER — Ambulatory Visit
Admission: RE | Admit: 2021-02-27 | Discharge: 2021-02-27 | Disposition: A | Discharge status: Home / Self Care | Payer: Medicare Other | Source: Ambulatory Visit | Attending: Adult Health | Admitting: Adult Health

## 2021-02-27 DIAGNOSIS — I671 Cerebral aneurysm, nonruptured: Secondary | ICD-10-CM | POA: Diagnosis not present

## 2021-02-27 DIAGNOSIS — I6523 Occlusion and stenosis of bilateral carotid arteries: Secondary | ICD-10-CM | POA: Diagnosis not present

## 2021-02-27 DIAGNOSIS — I672 Cerebral atherosclerosis: Secondary | ICD-10-CM | POA: Diagnosis not present

## 2021-02-27 DIAGNOSIS — G9389 Other specified disorders of brain: Secondary | ICD-10-CM | POA: Diagnosis not present

## 2021-02-27 MED ORDER — IOPAMIDOL (ISOVUE-370) INJECTION 76%
75.0000 mL | Freq: Once | INTRAVENOUS | Status: AC | PRN
  Administered 2021-02-27: 75 mL via INTRAVENOUS

## 2021-03-02 ENCOUNTER — Telehealth: Payer: Self-pay

## 2021-03-02 NOTE — Telephone Encounter (Signed)
Contacted pt at number listed, granddaughter answered who is not on DPR so I contacted son, Holly [name], who is. Informed him that patients recent CTA did not show any concern for aneurysm which was previously reported on the MRA (previously reported aneurysm likely artifactual or misinterpretation) - no indication for future monitoring or imaging. Advised to call the office with any concerns, he was pleased and understood.

## 2021-03-06 ENCOUNTER — Other Ambulatory Visit: Payer: Self-pay

## 2021-03-06 ENCOUNTER — Encounter: Payer: Self-pay | Admitting: Family Medicine

## 2021-03-06 ENCOUNTER — Ambulatory Visit (INDEPENDENT_AMBULATORY_CARE_PROVIDER_SITE_OTHER): Payer: Medicare Other | Admitting: Family Medicine

## 2021-03-06 VITALS — BP 136/71 | HR 70 | Temp 97.5°F | Ht 62.0 in | Wt 90.0 lb

## 2021-03-06 DIAGNOSIS — J449 Chronic obstructive pulmonary disease, unspecified: Secondary | ICD-10-CM | POA: Diagnosis not present

## 2021-03-06 DIAGNOSIS — R634 Abnormal weight loss: Secondary | ICD-10-CM | POA: Diagnosis not present

## 2021-03-06 DIAGNOSIS — E038 Other specified hypothyroidism: Secondary | ICD-10-CM | POA: Diagnosis not present

## 2021-03-06 MED ORDER — ALBUTEROL SULFATE HFA 108 (90 BASE) MCG/ACT IN AERS: 1.0000 | INHALATION_SPRAY | Freq: Four times a day (QID) | RESPIRATORY_TRACT | 3 refills | Status: DC | PRN

## 2021-03-06 NOTE — Assessment & Plan Note (Signed)
Continue Synthroid 100 mcg daily.  Check TSH next blood draw.

## 2021-03-06 NOTE — Assessment & Plan Note (Signed)
Recently had flare was seen in the ED.  Was given prednisone burst.  Symptoms have resolved.  She is doing well with albuterol as needed.

## 2021-03-06 NOTE — Assessment & Plan Note (Signed)
Weight stable at 90.  Discussed lifestyle modifications and high calorie diet.  We will follow-up again in 3 to 6 months.  She is up about 4 pounds over the last several weeks.

## 2021-03-06 NOTE — Patient Instructions (Signed)
It was very nice to see you today!  No changes today.  Please keep up the good work.  I will see back in 3 to 6 months.  Come back to see me sooner if needed.  Take care, Dr Jerline Pain  PLEASE NOTE:  If you had any lab tests please let us know if you have not heard back within a few days. You may see your results on mychart before we have a chance to review them but we will give you a call once they are reviewed by [place]. If we ordered any referrals today, please let us know if you have not heard from their office within the next week.   Please try these tips to maintain a healthy lifestyle:   Eat at least 3 REAL meals and 1-2 snacks per day.  Aim for no more than 5 hours between eating.  If you eat breakfast, please do so within one hour of getting up.    Each meal should contain half fruits/vegetables, one quarter protein, and one quarter carbs (no bigger than a computer mouse)   Cut down on sweet beverages. This includes juice, soda, and sweet tea.     Drink at least 1 glass of water with each meal and aim for at least 8 glasses per day   Exercise at least 150 minutes every week.

## 2021-03-06 NOTE — Progress Notes (Signed)
   Holly [name] is a 87 y.o. female who presents today for an office visit.  Assessment/Plan:  Chronic Problems Addressed Today: Weight loss Weight stable at 90.  Discussed lifestyle modifications and high calorie diet.  We will follow-up again in 3 to 6 months.  She is up about 4 pounds over the last several weeks.  COPD mixed type [facility]) Recently had flare was seen in the ED.  Was given prednisone burst.  Symptoms have resolved.  She is doing well with albuterol as needed.  Hypothyroidism Continue Synthroid 100 mcg daily.  Check TSH next blood draw.    Subjective:  HPI:  See a/p.         Objective:  Physical Exam: BP 136/71   Pulse 70   Temp (!) 97.5 F (36.4 C) (Temporal)   Ht 5\' 2"  (1.575 m)   Wt 90 lb (40.8 kg)   SpO2 98%   BMI 16.46 kg/m   Wt Readings from Last 3 Encounters:  03/06/21 90 lb (40.8 kg)  02/11/21 88 lb 9.6 oz (40.2 kg)  02/05/21 86 lb (39 kg)  Gen: No acute distress, resting comfortably CV: Regular rate and rhythm with no murmurs appreciated Pulm: Normal work of breathing, clear to auscultation bilaterally with no crackles, wheezes, or rhonchi Neuro: Grossly normal, moves all extremities Psych: Normal affect and thought content      Virgel Haro M. Jerline Pain, MD 03/06/2021 3:23 PM

## 2021-03-09 ENCOUNTER — Other Ambulatory Visit: Payer: Self-pay | Admitting: Family Medicine

## 2021-04-02 ENCOUNTER — Other Ambulatory Visit: Payer: Self-pay | Admitting: Family Medicine

## 2021-04-29 DIAGNOSIS — H43813 Vitreous degeneration, bilateral: Secondary | ICD-10-CM | POA: Diagnosis not present

## 2021-04-29 DIAGNOSIS — H353233 Exudative age-related macular degeneration, bilateral, with inactive scar: Secondary | ICD-10-CM | POA: Diagnosis not present

## 2021-05-28 ENCOUNTER — Telehealth: Payer: Self-pay

## 2021-05-28 NOTE — Chronic Care Management (AMB) (Signed)
Chronic Care Management   Outreach Note  05/28/2021 Name: Holly [name] MRN: [id number] DOB: 02-Nov-1928  Holly [name] is a 87 y.o. year old female who is a primary care patient of Ardith Dark, MD. I reached out to Holly [name] by phone today in response to a referral sent by [name]'s PCP, Ardith Dark, MD      An unsuccessful telephone outreach was attempted today. The patient was referred to the case management team for assistance with care management and care coordination.   Follow Up Plan: A HIPAA compliant phone message was left for the patient providing contact information and requesting a return call.  The care management team will reach out to the patient again over the next 7 days.  If patient returns call to provider office, please advise to call Embedded Care Management Care Guide Holly [name] at [phone]  Holly [name], RMA Care Guide, Embedded Care Coordination [facility]  [place], [place] [address] Direct Dial: [phone] [name]: [name]

## 2021-06-08 NOTE — Chronic Care Management (AMB) (Signed)
Chronic Care Management   Note  06/08/2021 Name: [name] MRN: [id number] DOB: [birth year]  Holly [name] is a 87 y.o. year old female who is a primary care patient of Ardith Dark, MD. I reached out to Holly [name] by phone today in response to a referral sent by Ms. Annia Friendly Harcum's PCP, Ardith Dark, MD      Ms. Unruh was given information about Chronic Care Management services today including:  CCM service includes personalized support from designated clinical staff supervised by her physician, including individualized plan of care and coordination with other care providers 24/7 contact phone numbers for assistance for urgent and routine care needs. Service will only be billed when office clinical staff spend 20 minutes or more in a month to coordinate care. Only one practitioner may furnish and bill the service in a calendar month. The patient may stop CCM services at any time (effective at the end of the month) by phone call to the office staff. The patient will be responsible for cost sharing (co-pay) of up to 20% of the service fee (after annual deductible is met).  Patient agreed to services and verbal consent obtained.   Follow up plan: Telephone appointment with care management team member scheduled for:06/25/2021  Penne Lash, RMA Care Guide, Embedded Care Coordination [facility]  [place], [place] [address] Direct Dial: [phone] [email or web address]

## 2021-06-25 ENCOUNTER — Ambulatory Visit (INDEPENDENT_AMBULATORY_CARE_PROVIDER_SITE_OTHER): Payer: Medicare Other | Admitting: *Deleted

## 2021-06-25 DIAGNOSIS — E785 Hyperlipidemia, unspecified: Secondary | ICD-10-CM

## 2021-06-25 DIAGNOSIS — J449 Chronic obstructive pulmonary disease, unspecified: Secondary | ICD-10-CM | POA: Diagnosis not present

## 2021-06-25 NOTE — Chronic Care Management (AMB) (Signed)
Care Management   Follow Up Note   06/25/2021 Name: Holly [name] MRN: [id number] DOB: [birth year]   Referred by: Ardith Dark, MD Reason for referral : Chronic Care Management (Initial, COPD)   Successful telephone outreach to patient and family for initial telephone assessment.  Son and patient returned call back.  RNCM introduced self and role and reviewed chronic care management program.  Son, Holly [name] states, granddaughter is Lawanna Kobus is in charge of patient's medical information.  He would like to discuss Chronic Care Management program with Lawanna Kobus and patient and have Lawanna Kobus return call with answer of participation.  Follow Up Plan: The care management team will reach out to the patient again over the next 30 business days to verify participation.   Rhae Lerner RN, MSN RN Care Management Coordinator  [facility] [phone] [email or web address]

## 2021-07-08 ENCOUNTER — Ambulatory Visit (HOSPITAL_COMMUNITY)
Admission: RE | Admit: 2021-07-08 | Discharge: 2021-07-08 | Disposition: A | Discharge status: Home / Self Care | Payer: Medicare Other | Source: Ambulatory Visit | Attending: Physician Assistant | Admitting: Physician Assistant

## 2021-07-08 ENCOUNTER — Other Ambulatory Visit: Payer: Self-pay

## 2021-07-08 ENCOUNTER — Encounter: Payer: Self-pay | Admitting: Physician Assistant

## 2021-07-08 ENCOUNTER — Ambulatory Visit (INDEPENDENT_AMBULATORY_CARE_PROVIDER_SITE_OTHER): Payer: Medicare Other | Admitting: Physician Assistant

## 2021-07-08 ENCOUNTER — Encounter (HOSPITAL_COMMUNITY): Payer: Self-pay

## 2021-07-08 VITALS — BP 150/70 | HR 64 | Temp 97.0°F | Ht 62.0 in | Wt 90.2 lb

## 2021-07-08 DIAGNOSIS — R519 Headache, unspecified: Secondary | ICD-10-CM | POA: Diagnosis not present

## 2021-07-08 DIAGNOSIS — Z8673 Personal history of transient ischemic attack (TIA), and cerebral infarction without residual deficits: Secondary | ICD-10-CM | POA: Diagnosis not present

## 2021-07-08 DIAGNOSIS — H539 Unspecified visual disturbance: Secondary | ICD-10-CM

## 2021-07-08 DIAGNOSIS — L84 Corns and callosities: Secondary | ICD-10-CM

## 2021-07-08 NOTE — Progress Notes (Signed)
Acute Office Visit  Subjective:    Patient ID: Holly [name], female    DOB: 1927/12/19, 87 y.o.   MRN: [id number]  Chief Complaint  Patient presents with   Nail Problem    Right 4th toe    Headache   HPI: She is here with her son today.  Patient is in today for initial complaint of pain at tip of right second toe, ongoing for "some time." States it hurts whenever she has a shoe on or something touches the end of her toe, otherwise it doesn't bother her. No injury. No numbness. No redness or discharge.   Patient also notes the last 5 days she has had two "very bad" headaches. Most recent was 24 hours again. She has had associated blurred vision in her right eye with each HA. Hx of two previous CVAs w/o residual issues. Denies any weakness or numbness. No CP or SOB. She feels good in office right now, but says she was stressed prior to coming in thinking I might have to take her toenail off.   BP Readings from Last 3 Encounters:  07/08/21 (!) 150/70  03/06/21 136/71  02/11/21 (!) 109/58   Wt Readings from Last 3 Encounters:  07/08/21 90 lb 4 oz (40.9 kg)  03/06/21 90 lb (40.8 kg)  02/11/21 88 lb 9.6 oz (40.2 kg)      Past Medical History:  Diagnosis Date   CAD (coronary artery disease)    CELLULITIS 06/18/2008   CHF (congestive heart failure) (HCC)    CHRONIC OBSTRUCTIVE PULMONARY DISEASE, ACUTE EXACERBATION 11/16/2007   COPD 05/18/2007   COVID-19    GERD 12/25/2007   HYPERLIPIDEMIA 05/18/2007   HYPOTHYROIDISM 05/18/2007   Macular degeneration    Stroke (HCC) 08/2018   Urosepsis     Past Surgical History:  Procedure Laterality Date   CATARACT EXTRACTION     DILATION AND CURETTAGE OF UTERUS     KYPHOPLASTY N/A 05/02/2020   Procedure: Thoracic eleven KYPHOPLASTY;  Surgeon: Coletta Memos, MD;  Location: [facility] OR;  Service: Neurosurgery;  Laterality: N/A;  3C   TEE WITHOUT CARDIOVERSION N/A 09/01/2018   Procedure: TRANSESOPHAGEAL ECHOCARDIOGRAM (TEE);  Surgeon: Jodelle Red, MD;  Location: [facility] ENDOSCOPY;  Service: Cardiovascular;  Laterality: N/A;   VARICOSE VEIN SURGERY      Family History  Problem Relation Age of Onset   Ovarian cancer Mother    Diabetes Mellitus II Brother     Social History   Socioeconomic History   Marital status: Widowed    Spouse name: Not on file   Number of children: Not on file   Years of education: Not on file   Highest education level: Not on file  Occupational History   Occupation: Retired  Tobacco Use   Smoking status: Former    Years: 40.00    Types: Cigarettes    Quit date: 11/29/1985    Years since quitting: 35.6   Smokeless tobacco: Never  Vaping Use   Vaping Use: Never used  Substance and Sexual Activity   Alcohol use: No   Drug use: No   Sexual activity: Not on file  Other Topics Concern   Not on file  Social History Narrative   Not on file   Social Determinants of Health   Financial Resource Strain: Not on file  Food Insecurity: Not on file  Transportation Needs: Not on file  Physical Activity: Not on file  Stress: Not on file  Social Connections: Not on file  Intimate Partner Violence: Not on file    Outpatient Medications Prior to Visit  Medication Sig Dispense Refill   acetaminophen (TYLENOL) 500 MG tablet Take 500 mg by mouth every 6 (six) hours as needed for mild pain.      albuterol (PROVENTIL HFA) 108 (90 Base) MCG/ACT inhaler Inhale 1-2 puffs into the lungs every 6 (six) hours as needed for wheezing or shortness of breath. 3 each 3   aspirin EC 81 MG tablet Take 1 tablet (81 mg total) by mouth daily. Swallow whole. 30 tablet 11   atorvastatin (LIPITOR) 40 MG tablet Take 1 tablet (40 mg total) by mouth daily. 90 tablet 3   azelastine (ASTELIN) 0.1 % nasal spray PLACE 2 SPRAYS INTO BOTH NOSTRILS 2 (TWO) TIMES DAILY 30 mL 12   Fluticasone-Salmeterol (WIXELA INHUB) 500-50 MCG/DOSE AEPB Inhale 1 puff into the lungs 2 (two) times daily. Advair 180 each 1   levothyroxine (SYNTHROID)  100 MCG tablet TAKE 1 TABLET BY MOUTH EVERY DAY 90 tablet 1   Multiple Vitamins-Minerals (ICAPS PO) Take 1 capsule by mouth 2 (two) times daily.     No facility-administered medications prior to visit.    Allergies  Allergen Reactions   Levofloxacin     REACTION: unspecified   Macrobid [Nitrofurantoin Macrocrystal] Nausea Only   Sulfamethoxazole     REACTION: unspecified   Tramadol Other (See Comments)    Shakes    Amoxicillin Rash    Has patient had a PCN reaction causing immediate rash, facial/tongue/throat swelling, SOB or lightheadedness with hypotension: No Has patient had a PCN reaction causing severe rash involving mucus membranes or skin necrosis: No Has patient had a PCN reaction that required hospitalization: No Has patient had a PCN reaction occurring within the last 10 years: No If all of the above answers are "NO", then may proceed with Cephalosporin use.   Made "bottom area" very raw    Review of Systems  Neurological:  Positive for headaches.  REFER TO HPI FOR PERTINENT POSITIVES AND NEGATIVES     Objective:    Physical Exam Vitals and nursing note reviewed.  Constitutional:      Appearance: Normal appearance. She is not toxic-appearing.     Comments: THIN  HENT:     Head: Normocephalic and atraumatic.     Right Ear: Tympanic membrane, ear canal and external ear normal.     Left Ear: Tympanic membrane, ear canal and external ear normal.     Nose: Nose normal.     Mouth/Throat:     Mouth: Mucous membranes are moist.  Eyes:     Extraocular Movements: Extraocular movements intact.     Conjunctiva/sclera: Conjunctivae normal.     Pupils: Pupils are equal, round, and reactive to light.  Cardiovascular:     Rate and Rhythm: Normal rate and regular rhythm.     Pulses: Normal pulses.     Heart sounds: Normal heart sounds.  Pulmonary:     Effort: Pulmonary effort is normal.     Breath sounds: Normal breath sounds.  Abdominal:     General: Abdomen is  flat.  Musculoskeletal:        General: Normal range of motion.     Cervical back: Normal range of motion and neck supple.  Skin:    General: Skin is warm and dry.     Comments: Tip of right second toe there is thickened skin; no surrounding redness. No pus. N/V intact.   Neurological:  General: No focal deficit present.     Mental Status: She is alert and oriented to person, place, and time.     Cranial Nerves: No cranial nerve deficit, dysarthria or facial asymmetry.     Gait: Gait normal.  Psychiatric:        Mood and Affect: Mood normal.        Behavior: Behavior normal.        Thought Content: Thought content normal.        Judgment: Judgment normal.    BP (!) 150/70   Pulse 64   Temp (!) 97 F (36.1 C)   Ht 5\' 2"  (1.575 m)   Wt 90 lb 4 oz (40.9 kg)   SpO2 95%   BMI 16.51 kg/m  Wt Readings from Last 3 Encounters:  07/08/21 90 lb 4 oz (40.9 kg)  03/06/21 90 lb (40.8 kg)  02/11/21 88 lb 9.6 oz (40.2 kg)    Health Maintenance Due  Topic Date Due   COVID-19 Vaccine (1) Never done   Zoster Vaccines- Shingrix (1 of 2) Never done   DEXA SCAN  Never done   INFLUENZA VACCINE  06/29/2021    There are no preventive care reminders to display for this patient.   Lab Results  Component Value Date   TSH 1.96 04/07/2020   Lab Results  Component Value Date   WBC 5.5 02/05/2021   HGB 13.4 02/05/2021   HCT 41.1 02/05/2021   MCV 96.9 02/05/2021   PLT 210 02/05/2021   Lab Results  Component Value Date   NA 137 02/05/2021   K 4.2 02/05/2021   CO2 25 02/05/2021   GLUCOSE 98 02/05/2021   BUN 14 02/05/2021   CREATININE 0.92 02/05/2021   BILITOT 0.9 02/05/2021   ALKPHOS 73 02/05/2021   AST 34 02/05/2021   ALT 18 02/05/2021   PROT 6.8 02/05/2021   ALBUMIN 3.6 02/05/2021   CALCIUM 9.1 02/05/2021   ANIONGAP 9 02/05/2021   GFR 54.57 (L) 04/07/2020   Lab Results  Component Value Date   CHOL 129 07/06/2019   Lab Results  Component Value Date   HDL 63.40  07/06/2019   Lab Results  Component Value Date   LDLCALC 53 07/06/2019   Lab Results  Component Value Date   TRIG 62.0 07/06/2019   Lab Results  Component Value Date   CHOLHDL 2 07/06/2019   Lab Results  Component Value Date   HGBA1C 5.7 07/06/2019       Assessment & Plan:   Problem List Items Addressed This Visit   None Visit Diagnoses     Callus of foot    -  Primary   Right-sided headache       Relevant Orders   CT HEAD WO CONTRAST ( ) (Completed)   Change in vision       Relevant Orders   CT HEAD WO CONTRAST ( ) (Completed)   History of CVA in adulthood       Relevant Orders   CT HEAD WO CONTRAST ( ) (Completed)       1. Callus of foot Pared with #10 blade today and pt did feel some relief. Recommend OTC pads for corns and calluses as well.   2. Right-sided headache 3. Change in vision 4. History of CVA in adulthood Discussed with Dr. Jimmey Ralph. Recommended STAT CT head. Will schedule for today. Pt is to go to ED if any sudden return of symptoms. Otherwise no focal neuro findings on exam currently.  Shae Augello M Vincenta Steffey, PA-C

## 2021-07-13 ENCOUNTER — Ambulatory Visit (INDEPENDENT_AMBULATORY_CARE_PROVIDER_SITE_OTHER): Payer: Medicare Other

## 2021-07-13 VITALS — BP 90/65 | HR 91 | Temp 97.7°F | Wt 91.0 lb

## 2021-07-13 DIAGNOSIS — Z0000 Encounter for general adult medical examination without abnormal findings: Secondary | ICD-10-CM

## 2021-07-13 NOTE — Patient Instructions (Addendum)
[name] , Thank you for taking time to come for your Medicare Wellness Visit. I appreciate your ongoing commitment to your health goals. Please review the following plan we discussed and let me know if I can assist you in the future.   Screening recommendations/referrals: Colonoscopy: No longer required Mammogram: No longer required  Recommended yearly ophthalmology/optometry visit for glaucoma screening and checkup Recommended yearly dental visit for hygiene and checkup  Vaccinations: Influenza vaccine: Due  Pneumococcal vaccine: Completed  Tdap vaccine: Due and discussed Shingles vaccine: Shingrix discussed. Please contact your pharmacy for coverage information.    Covid-19:declined   Advanced directives: Advance directive discussed with you today. I have provided a copy for you to complete at home and have notarized. Once this is complete please bring a copy in to our office so we can scan it into your chart.  Conditions/risks identified: None at this time   Next appointment: Follow up in one year for your annual wellness visit    Preventive Care 65 Years and Older, Female Preventive care refers to lifestyle choices and visits with your health care provider that can promote health and wellness. What does preventive care include? A yearly physical exam. This is also called an annual well check. Dental exams once or twice a year. Routine eye exams. Ask your health care provider how often you should have your eyes checked. Personal lifestyle choices, including: Daily care of your teeth and gums. Regular physical activity. Eating a healthy diet. Avoiding tobacco and drug use. Limiting alcohol use. Practicing safe sex. Taking low-dose aspirin every day. Taking vitamin and mineral supplements as recommended by your health care provider. What happens during an annual well check? The services and screenings done by your health care provider during your annual well check will  depend on your age, overall health, lifestyle risk factors, and family history of disease. Counseling  Your health care provider may ask you questions about your: Alcohol use. Tobacco use. Drug use. Emotional well-being. Home and relationship well-being. Sexual activity. Eating habits. History of falls. Memory and ability to understand (cognition). Work and work Statistician. Reproductive health. Screening  You may have the following tests or measurements: Height, weight, and BMI. Blood pressure. Lipid and cholesterol levels. These may be checked every 5 years, or more frequently if you are over 85 years old. Skin check. Lung cancer screening. You may have this screening every year starting at age 31 if you have a 30-pack-year history of smoking and currently smoke or have quit within the past 15 years. Fecal occult blood test (FOBT) of the stool. You may have this test every year starting at age 32. Flexible sigmoidoscopy or colonoscopy. You may have a sigmoidoscopy every 5 years or a colonoscopy every 10 years starting at age 32. Hepatitis C blood test. Hepatitis B blood test. Sexually transmitted disease (STD) testing. Diabetes screening. This is done by checking your blood sugar (glucose) after you have not eaten for a while (fasting). You may have this done every 1-3 years. Bone density scan. This is done to screen for osteoporosis. You may have this done starting at age 27. Mammogram. This may be done every 1-2 years. Talk to your health care provider about how often you should have regular mammograms. Talk with your health care provider about your test results, treatment options, and if necessary, the need for more tests. Vaccines  Your health care provider may recommend certain vaccines, such as: Influenza vaccine. This is recommended every year. Tetanus, diphtheria,  and acellular pertussis (Tdap, Td) vaccine. You may need a Td booster every 10 years. Zoster vaccine. You may  need this after age 5. Pneumococcal 13-valent conjugate (PCV13) vaccine. One dose is recommended after age 35. Pneumococcal polysaccharide (PPSV23) vaccine. One dose is recommended after age 47. Talk to your health care provider about which screenings and vaccines you need and how often you need them. This information is not intended to replace advice given to you by your health care provider. Make sure you discuss any questions you have with your health care provider. Document Released: 12/12/2015 Document Revised: 08/04/2016 Document Reviewed: 09/16/2015 Elsevier Interactive Patient Education  2017 [place] Prevention in the Home Falls can cause injuries. They can happen to people of all ages. There are many things you can do to make your home safe and to help prevent falls. What can I do on the outside of my home? Regularly fix the edges of walkways and driveways and fix any cracks. Remove anything that might make you trip as you walk through a door, such as a raised step or threshold. Trim any bushes or trees on the path to your home. Use bright outdoor lighting. Clear any walking paths of anything that might make someone trip, such as rocks or tools. Regularly check to see if handrails are loose or broken. Make sure that both sides of any steps have handrails. Any raised decks and porches should have guardrails on the edges. Have any leaves, snow, or ice cleared regularly. Use sand or salt on walking paths during winter. Clean up any spills in your garage right away. This includes oil or grease spills. What can I do in the bathroom? Use night lights. Install grab bars by the toilet and in the tub and shower. Do not use towel bars as grab bars. Use non-skid mats or decals in the tub or shower. If you need to sit down in the shower, use a plastic, non-slip stool. Keep the floor dry. Clean up any water that spills on the floor as soon as it happens. Remove soap buildup in  the tub or shower regularly. Attach bath mats securely with double-sided non-slip rug tape. Do not have throw rugs and other things on the floor that can make you trip. What can I do in the bedroom? Use night lights. Make sure that you have a light by your bed that is easy to reach. Do not use any sheets or blankets that are too big for your bed. They should not hang down onto the floor. Have a firm chair that has side arms. You can use this for support while you get dressed. Do not have throw rugs and other things on the floor that can make you trip. What can I do in the kitchen? Clean up any spills right away. Avoid walking on wet floors. Keep items that you use a lot in easy-to-reach places. If you need to reach something above you, use a strong step stool that has a grab bar. Keep electrical cords out of the way. Do not use floor polish or wax that makes floors slippery. If you must use wax, use non-skid floor wax. Do not have throw rugs and other things on the floor that can make you trip. What can I do with my stairs? Do not leave any items on the stairs. Make sure that there are handrails on both sides of the stairs and use them. Fix handrails that are broken or loose. Make sure  that handrails are as long as the stairways. Check any carpeting to make sure that it is firmly attached to the stairs. Fix any carpet that is loose or worn. Avoid having throw rugs at the top or bottom of the stairs. If you do have throw rugs, attach them to the floor with carpet tape. Make sure that you have a light switch at the top of the stairs and the bottom of the stairs. If you do not have them, ask someone to add them for you. What else can I do to help prevent falls? Wear shoes that: Do not have high heels. Have rubber bottoms. Are comfortable and fit you well. Are closed at the toe. Do not wear sandals. If you use a stepladder: Make sure that it is fully opened. Do not climb a closed  stepladder. Make sure that both sides of the stepladder are locked into place. Ask someone to hold it for you, if possible. Clearly mark and make sure that you can see: Any grab bars or handrails. First and last steps. Where the edge of each step is. Use tools that help you move around (mobility aids) if they are needed. These include: Canes. Walkers. Scooters. Crutches. Turn on the lights when you go into a dark area. Replace any light bulbs as soon as they burn out. Set up your furniture so you have a clear path. Avoid moving your furniture around. If any of your floors are uneven, fix them. If there are any pets around you, be aware of where they are. Review your medicines with your doctor. Some medicines can make you feel dizzy. This can increase your chance of falling. Ask your doctor what other things that you can do to help prevent falls. This information is not intended to replace advice given to you by your health care provider. Make sure you discuss any questions you have with your health care provider. Document Released: 09/11/2009 Document Revised: 04/22/2016 Document Reviewed: 12/20/2014 Elsevier Interactive Patient Education  2017 [employer].

## 2021-07-13 NOTE — Progress Notes (Addendum)
Subjective:   Holly [name] is a 87 y.o. female who presents for Medicare Annual (Subsequent) preventive examination.  Review of Systems     Cardiac Risk Factors include: advanced age (>56mn, >>60women);dyslipidemia     Objective:    Today's Vitals   07/13/21 1138  BP: 90/65  Pulse: 91  Temp: 97.7 F (36.5 C)  SpO2: 91%  Weight: 91 lb (41.3 kg)   Body mass index is 16.64 kg/m.  Advanced Directives 07/13/2021 02/05/2021 07/07/2020 12/01/2019 08/31/2018 08/30/2018  Does Patient Have a Medical Advance Directive? No No - Yes Yes Yes  Type of Advance Directive - - Living will;Healthcare Power of [place] will  Does patient want to make changes to medical advance directive? - - - - No - Patient declined -  Copy of [place] Chart? - - No - copy requested No - copy requested - -  Would patient like information on creating a medical advance directive? Yes (MAU/Ambulatory/Procedural Areas - Information given) No - Patient declined - - - -    Current Medications (verified) Outpatient Encounter Medications as of 07/13/2021  Medication Sig   acetaminophen (TYLENOL) 500 MG tablet Take 500 mg by mouth every 6 (six) hours as needed for mild pain.    albuterol (PROVENTIL HFA) 108 (90 Base) MCG/ACT inhaler Inhale 1-2 puffs into the lungs every 6 (six) hours as needed for wheezing or shortness of breath.   aspirin EC 81 MG tablet Take 1 tablet (81 mg total) by mouth daily. Swallow whole.   atorvastatin (LIPITOR) 40 MG tablet Take 1 tablet (40 mg total) by mouth daily.   azelastine (ASTELIN) 0.1 % nasal spray PLACE 2 SPRAYS INTO BOTH NOSTRILS 2 (TWO) TIMES DAILY   levothyroxine (SYNTHROID) 100 MCG tablet TAKE 1 TABLET BY MOUTH EVERY DAY   Multiple Vitamins-Minerals (ICAPS PO) Take 1 capsule by mouth 2 (two) times daily.   Fluticasone-Salmeterol (WIXELA INHUB) 500-50 MCG/DOSE AEPB  Inhale 1 puff into the lungs 2 (two) times daily. Advair   No facility-administered encounter medications on file as of 07/13/2021.    Allergies (verified) Levofloxacin, Macrobid [nitrofurantoin macrocrystal], Sulfamethoxazole, Tramadol, and Amoxicillin   History: Past Medical History:  Diagnosis Date   CAD (coronary artery disease)    CELLULITIS 06/18/2008   CHF ([place]estive heart failure) ([place]    CHRONIC OBSTRUCTIVE PULMONARY DISEASE, ACUTE EXACERBATION 11/16/2007   COPD 05/18/2007   COVID-19    GERD 12/25/2007   HYPERLIPIDEMIA 05/18/2007   HYPOTHYROIDISM 05/18/2007   Macular degeneration    Stroke [place] 08/[place]   Urosepsis    Past Surgical History:  Procedure Laterality Date   CATARACT EXTRACTION     DILATION AND CURETTAGE OF UTERUS     KYPHOPLASTY N/A 05/02/2020   Procedure: Thoracic eleven KYPHOPLASTY;  Surgeon: CAshok Pall MD;  [facility]  Service: Neurosurgery;  Laterality: N/A;  3C   TEE WITHOUT CARDIOVERSION N/A 09/01/2018   Procedure: TRANSESOPHAGEAL ECHOCARDIOGRAM (TEE);  Surgeon: CBuford Dresser MD;  [facility]  Service: Cardiovascular;  Laterality: N/A;   VARICOSE VEIN SURGERY     Family History  Problem Relation Age of Onset   Ovarian cancer Mother    Diabetes Mellitus II Brother    Social History   Socioeconomic History   Marital status: Widowed    Spouse name: Not on file   Number of children: Not on file   Years of education: Not  on file   Highest education level: Not on file  Occupational History   Occupation: Retired  Tobacco Use   Smoking status: Former    Years: 40.00    Types: Cigarettes    Quit date: 11/29/1985    Years since quitting: 35.6   Smokeless tobacco: Never  Vaping Use   Vaping Use: Never used  Substance and Sexual Activity   Alcohol use: No   Drug use: No   Sexual activity: Not on file  Other Topics Concern   Not on file  Social History Narrative   Not on file   Social Determinants of Health    Financial Resource Strain: Low Risk    Difficulty of Paying Living Expenses: Not hard at all  Food Insecurity: No Food Insecurity   Worried About Charity fundraiser in the Last Year: Never true   [place] in the Last Year: Never true  Transportation Needs: No Transportation Needs   Lack of Transportation (Medical): No   Lack of Transportation (Non-Medical): No  Physical Activity: Insufficiently Active   Days of Exercise per Week: 5 days   Minutes of Exercise per Session: 10 min  Stress: No Stress Concern Present   Feeling of Stress : Not at all  Social Connections: Socially Isolated   Frequency of Communication with Friends and Family: More than three times a week   Frequency of Social Gatherings with Friends and Family: More than three times a week   Attends Religious Services: Never   Marine scientist or Organizations: No   Attends Archivist Meetings: Never   Marital Status: Widowed    Tobacco Counseling Counseling given: Not Answered   Clinical Intake:  Pre-visit preparation completed: Yes  Pain : No/denies pain     BMI - recorded: 16.64 Nutritional Status: BMI <19  Underweight Nutritional Risks: None Diabetes: No  How often do you need to have someone help you when you read instructions, pamphlets, or other written materials from your doctor or pharmacy?: 1 - Never  Diabetic?No  Interpreter Needed?: No  Information entered by :: Charlott Rakes, LPN   Activities of Daily Living In your present state of health, do you have any difficulty performing the following activities: 07/13/2021  Hearing? Y  Comment hearing aid  Vision? N  Difficulty concentrating or making decisions? N  Walking or climbing stairs? N  Dressing or bathing? N  Doing errands, shopping? N  Preparing Food and eating ? N  Using the Toilet? N  In the past six months, have you accidently leaked urine? N  Do you have problems with loss of bowel control? N   Managing your Medications? N  Managing your Finances? N  Housekeeping or managing your Housekeeping? N  Some recent data might be hidden    Patient Care Team: Vivi Barrack, MD as PCP - General (Family Medicine) Evans Lance, MD as PCP - Cardiology (Cardiology) Leona Singleton, RN as Case Manager  Indicate any recent Medical Services you may have received from other than [facility] providers in the past year (date may be approximate).     Assessment:   This is a routine wellness examination for Nayanna.  Hearing/Vision screen Hearing Screening - Comments:: Wears hearing aids  Vision Screening - Comments:: Pt follows up with dr Lorrene Reid for annual eye exams   Dietary issues and exercise activities discussed: Current Exercise Habits: Home exercise routine, Type of exercise: walking, Time (Minutes): 15, Frequency (Times/Week):  5, Weekly Exercise (Minutes/Week): 75   Goals Addressed             This Visit's Progress    Patient Stated       None at this time        Depression Screen PHQ 2/9 Scores 07/13/2021 07/07/2020 07/06/2019 02/01/2017 12/24/2016 12/23/2015 12/18/2014  PHQ - 2 Score 0 0 0 0 0 0 1    Fall Risk Fall Risk  07/13/2021 07/07/2020 10/03/2018 02/01/2017 12/24/2016  Falls in the past year? 0 0 0 No No  Number falls in past yr: 0 0 - - -  Injury with Fall? 0 0 - - -  Risk for fall due to : - Impaired balance/gait - - -  Follow up Falls prevention discussed Falls prevention discussed - - -    FALL RISK PREVENTION PERTAINING TO THE HOME:  Any stairs in or around the home? Yes  If so, are there any without handrails? No  Home free of loose throw rugs in walkways, pet beds, electrical cords, etc? Yes  Adequate lighting in your home to reduce risk of falls? Yes   ASSISTIVE DEVICES UTILIZED TO PREVENT FALLS:  Life alert? Yes  Use of a cane, walker or w/c? No  Grab bars in the bathroom? Yes  Shower chair or bench in shower? Yes  Elevated toilet seat or a  handicapped toilet? No   TIMED UP AND GO:  Was the test performed? Yes .  Length of time to ambulate 10 feet: 10 sec.   Gait steady and fast without use of assistive device  Cognitive Function:     6CIT Screen 07/13/2021 07/07/2020  What Year? 0 points 0 points  What month? 0 points 0 points  What time? 0 points -  Count back from 20 - 0 points  Months in reverse 0 points 2 points  Repeat phrase 4 points 8 points    Immunizations Immunization History  Administered Date(s) Administered   Fluad Quad(high Dose 65+) 11/19/2019, 10/10/2020   Influenza Split 08/23/2011, 08/22/2012   Influenza Whole 09/18/2007, 09/10/2008, 09/10/2009, 08/19/2010   Influenza, High Dose Seasonal PF 09/03/2013, 12/23/2014, 09/26/2015, 08/31/2016, 09/07/2017, 09/07/2018   Pneumococcal Conjugate-13 12/23/2014   Pneumococcal Polysaccharide-23 09/17/2013   Td 06/11/2008    TDAP status: Due, Education has been provided regarding the importance of this vaccine. Advised may receive this vaccine at local pharmacy or Health Dept. Aware to provide a copy of the vaccination record if obtained from local pharmacy or Health Dept. Verbalized acceptance and understanding.  Flu Vaccine status: Due, Education has been provided regarding the importance of this vaccine. Advised may receive this vaccine at local pharmacy or Health Dept. Aware to provide a copy of the vaccination record if obtained from local pharmacy or Health Dept. Verbalized acceptance and understanding.  Pneumococcal vaccine status: Up to date  Covid-19 vaccine status: Declined, Education has been provided regarding the importance of this vaccine but patient still declined. Advised may receive this vaccine at local pharmacy or Health Dept.or vaccine clinic. Aware to provide a copy of the vaccination record if obtained from local pharmacy or Health Dept. Verbalized acceptance and understanding.  Qualifies for Shingles Vaccine? Yes   Zostavax completed No    Shingrix Completed?: No.    Education has been provided regarding the importance of this vaccine. Patient has been advised to call insurance company to determine out of pocket expense if they have not yet received this vaccine. Advised may also receive vaccine at  local pharmacy or Health Dept. Verbalized acceptance and understanding.  Screening Tests Health Maintenance  Topic Date Due   Zoster Vaccines- Shingrix (1 of 2) Never done   DEXA SCAN  Never done   TETANUS/TDAP  06/11/2018   INFLUENZA VACCINE  06/29/2021   COVID-19 Vaccine (1) 07/29/2021 (Originally 01/02/1933)   PNA vac Low Risk Adult  Completed   HPV VACCINES  Aged Out    Health Maintenance  Health Maintenance Due  Topic Date Due   Zoster Vaccines- Shingrix (1 of 2) Never done   DEXA SCAN  Never done   TETANUS/TDAP  06/11/2018   INFLUENZA VACCINE  06/29/2021    Colorectal cancer screening: No longer required.   Mammogram status: No longer required due to age.     Additional Screening:  Hepatitis C Screening: does not qualify;  Vision Screening: Recommended annual ophthalmology exams for early detection of glaucoma and other disorders of the eye. Is the patient up to date with their annual eye exam?  Yes  Who is the provider or what is the name of the office in which the patient attends annual eye exams? Dr Tye Savoy  If pt is not established with a provider, would they like to be referred to a provider to establish care? No .   Dental Screening: Recommended annual dental exams for proper oral hygiene  Community Resource Referral / Chronic Care Management: CRR required this visit?  No   CCM required this visit?  No      Plan:     I have personally reviewed and noted the following in the patient's chart:   Medical and social history Use of alcohol, tobacco or illicit drugs  Current medications and supplements including opioid prescriptions.  Functional ability and status Nutritional status Physical  activity Advanced directives List of other physicians Hospitalizations, surgeries, and ER visits in previous 12 months Vitals Screenings to include cognitive, depression, and falls Referrals and appointments  In addition, I have reviewed and discussed with patient certain preventive protocols, quality metrics, and best practice recommendations. A written personalized care plan for preventive services as well as general preventive health recommendations were provided to patient.     Willette Brace, LPN   [id number]   Nurse Notes: pt concerned about right foot 4th digit pain in the area pt will make an appt to have it checked . Pt also stated wanted a podiatrist as well.

## 2021-07-20 ENCOUNTER — Other Ambulatory Visit: Payer: Self-pay

## 2021-07-20 ENCOUNTER — Ambulatory Visit (INDEPENDENT_AMBULATORY_CARE_PROVIDER_SITE_OTHER): Payer: Medicare Other | Admitting: Family Medicine

## 2021-07-20 ENCOUNTER — Encounter: Payer: Self-pay | Admitting: Family Medicine

## 2021-07-20 VITALS — BP 150/84 | HR 62 | Temp 97.6°F | Ht 62.0 in | Wt 90.0 lb

## 2021-07-20 DIAGNOSIS — B351 Tinea unguium: Secondary | ICD-10-CM | POA: Diagnosis not present

## 2021-07-20 DIAGNOSIS — L600 Ingrowing nail: Secondary | ICD-10-CM | POA: Diagnosis not present

## 2021-07-20 NOTE — Patient Instructions (Addendum)
It was very nice to see you today!  Fingernail or Toenail Removal, Adult, Care After This sheet gives you information about how to care for yourself after your procedure. Your health care provider may also give you more specific instructions. If you have problems or questions, contact your health careprovider. What can I expect after the procedure? After the procedure, it is common to have: Pain. Redness. Swelling. Soreness. Follow these instructions at home: Medicines Take over-the-counter and prescription medicines only as told by your health care provider. If you were prescribed an antibiotic medicine, take or apply it as told by your health care provider. Do not stop using the antibiotic even if you start to feel better. Wound care Follow instructions from your health care provider about how to take care of your wound. Make sure you: Wash your hands with soap and water for at least 20 seconds before and after you change your bandage (dressing). If soap and water are not available, use hand sanitizer. Change your dressing as told by your health care provider. Keep your dressing dry until your health care provider says it can be removed. Check your wound every day for signs of infection. Check for: More redness, swelling, or pain. More fluid or blood. Warmth. Pus or a bad smell. If you have a splint: Bilirubin Wear the splint as told by your health care provider. Remove it only as told by your health care provider. Loosen the splint if your fingers tingle, become numb, or turn cold and blue. Keep the splint clean. If the splint is not waterproof: Do not let it get wet. Cover it with a watertight covering when you take a bath or a shower.  Managing pain, stiffness, and swelling Move your fingers or toes often to reduce stiffness and swelling. Raise (elevate) the injured area above the level of your heart while you are sitting or lying down. You may need to keep your hand or foot  raised or supported on a pillow for 24 hours or as told by your health care provider. General instructions If you were given a shoe to wear, wear it as told by your health care provider. Keep all follow-up visits as told by your health care provider. This is important. Contact a health care provider if: You have more redness, swelling, or pain around your wound. You have more fluid or blood coming from your wound. You have pus or a bad smell coming from your wound. Your wound feels warm to the touch. You have a fever. Get help right away if: Your finger or toe looks pale, blue, or black. You are not able to move your finger or toe. Summary After the procedure, it is common to have pain and swelling. Keep the hand or foot raised (elevated) or supported on a pillow as told by your health care provider. Take over-the-counter and prescription medicines only as told by your health care provider. Check your wound every day for signs of infection. This information is not intended to replace advice given to you by your health care provider. Make sure you discuss any questions you have with your healthcare provider. Document Revised: 07/09/2019 Document Reviewed: 07/09/2019 Elsevier Patient Education  2022 [place].   Take care, Dr Jerline Pain  PLEASE NOTE:  If you had any lab tests please let us know if you have not heard back within a few days. You may see your results on mychart before we have a chance to review them but we will give  you a call once they are reviewed by [place]. If we ordered any referrals today, please let us know if you have not heard from their office within the next week.   Please try these tips to maintain a healthy lifestyle:  Eat at least 3 REAL meals and 1-2 snacks per day.  Aim for no more than 5 hours between eating.  If you eat breakfast, please do so within one hour of getting up.   Each meal should contain half fruits/vegetables, one quarter protein, and one  quarter carbs (no bigger than a computer mouse)  Cut down on sweet beverages. This includes juice, soda, and sweet tea.   Drink at least 1 glass of water with each meal and aim for at least 8 glasses per day  Exercise at least 150 minutes every week.

## 2021-07-20 NOTE — Progress Notes (Signed)
   Holly [name] is a 87 y.o. female who presents today for an office visit.  Assessment/Plan:  New/Acute Problems: Onychomycosis/painful ingrown toenail Discussed treatment options.  Patient elected to have no removal today.  See below procedure note.  She tolerated well.  Can use over-the-counter medications as needed.    HPI:  CC of the patient is ingrown toenail. She has been treating the area with a topical anti-fungal liquid treatment. The pain is mostly present when she is trying to put shoes on.          Objective:  Physical Exam: BP (!) 150/84   Pulse 62   Temp 97.6 F (36.4 C) (Temporal)   Ht '5\' 2"'$  (1.575 m)   Wt 90 lb (40.8 kg)   SpO2 99%   BMI 16.46 kg/m   Gen: No acute distress, resting comfortably MSK: Onychomycosis noted on all toenails on right foot predominantly second digit. Neuro: Grossly normal, moves all extremities Psych: Normal affect and thought content  Toenail Removal Procedure note:  Written consent was obtained. A digital block was performed using 2% lidocaine without epinephrine. Using a tourniquet for hemostasis and sterile instruments, the nail was freed from the nail bed.   This was well tolerated with minimal bleeding. Antibiotic ointment and a dressing were applied. OTC analgesics were recommended pain. Care after instructions were discussed and handout was given.        I,Jordan Kelly,acting as a Education administrator for Dimas Chyle, MD.,have documented all relevant documentation on the behalf of Dimas Chyle, MD,as directed by  Dimas Chyle, MD while in the presence of Dimas Chyle, MD.  I, Dimas Chyle, MD, have reviewed all documentation for this visit. The documentation on 07/20/21 for the exam, diagnosis, procedures, and orders are all accurate and complete.  [name]. Jerline Pain, MD 07/20/2021 1:40 PM

## 2021-07-21 ENCOUNTER — Telehealth: Payer: Medicare Other

## 2021-10-10 ENCOUNTER — Other Ambulatory Visit: Payer: Self-pay | Admitting: Family Medicine

## 2021-10-12 ENCOUNTER — Other Ambulatory Visit: Payer: Self-pay

## 2021-10-12 ENCOUNTER — Ambulatory Visit (INDEPENDENT_AMBULATORY_CARE_PROVIDER_SITE_OTHER): Payer: Medicare Other | Admitting: Family Medicine

## 2021-10-12 ENCOUNTER — Encounter: Payer: Self-pay | Admitting: Family Medicine

## 2021-10-12 VITALS — BP 138/79 | HR 88 | Temp 98.0°F | Ht 62.0 in | Wt 87.0 lb

## 2021-10-12 DIAGNOSIS — E785 Hyperlipidemia, unspecified: Secondary | ICD-10-CM | POA: Diagnosis not present

## 2021-10-12 DIAGNOSIS — J449 Chronic obstructive pulmonary disease, unspecified: Secondary | ICD-10-CM

## 2021-10-12 DIAGNOSIS — B351 Tinea unguium: Secondary | ICD-10-CM

## 2021-10-12 MED ORDER — AZITHROMYCIN 250 MG PO TABS: ORAL_TABLET | ORAL | 0 refills | Status: DC

## 2021-10-12 MED ORDER — AZELASTINE HCL 0.1 % NA SOLN: 2.0000 | Freq: Two times a day (BID) | NASAL | 12 refills | Status: DC

## 2021-10-12 MED ORDER — ATORVASTATIN CALCIUM 40 MG PO TABS: 40.0000 mg | ORAL_TABLET | Freq: Every day | ORAL | 3 refills | Status: DC

## 2021-10-12 MED ORDER — ALBUTEROL SULFATE HFA 108 (90 BASE) MCG/ACT IN AERS: 1.0000 | INHALATION_SPRAY | Freq: Four times a day (QID) | RESPIRATORY_TRACT | 3 refills | Status: DC | PRN

## 2021-10-12 NOTE — Patient Instructions (Signed)
It was very nice to see you today!  Please start the Z-Pak.  I will refill your other medications.  Let us know if your symptoms are improving over the next several days.  Take care, Dr Jerline Pain  PLEASE NOTE:  If you had any lab tests please let us know if you have not heard back within a few days. You may see your results on mychart before we have a chance to review them but we will give you a call once they are reviewed by [place]. If we ordered any referrals today, please let us know if you have not heard from their office within the next week.   Please try these tips to maintain a healthy lifestyle:  Eat at least 3 REAL meals and 1-2 snacks per day.  Aim for no more than 5 hours between eating.  If you eat breakfast, please do so within one hour of getting up.   Each meal should contain half fruits/vegetables, one quarter protein, and one quarter carbs (no bigger than a computer mouse)  Cut down on sweet beverages. This includes juice, soda, and sweet tea.   Drink at least 1 glass of water with each meal and aim for at least 8 glasses per day  Exercise at least 150 minutes every week.

## 2021-10-12 NOTE — Assessment & Plan Note (Signed)
Refilled lipitor today.  She is tolerating well.

## 2021-10-12 NOTE — Assessment & Plan Note (Signed)
Mild flare due to her URI.  Start Z-Pak.  Refilled inhalers.

## 2021-10-12 NOTE — Progress Notes (Signed)
   Holly [name] is a 87 y.o. female who presents today for an office visit.  Assessment/Plan:  New/Acute Problems: Sinusitis Start Astelin nasal spray.  Start Z-Pak.  She likely has a mild COPD flare as well.  Discussed reasons to return to care.  Follow-up as needed.  Onychomycosis Will refer to podiatry.  Chronic Problems Addressed Today: COPD mixed type ([place]) Mild flare due to her URI.  Start Z-Pak.  Refilled inhalers.  Dyslipidemia Refilled lipitor today.  She is tolerating well.     Subjective:  HPI: CC of the patient is laryngitis and fever, and she complains that she is unable to get warm. An additional symptom that she has noted is that she hears a noise " like wind beating against her ears". The sore throat had been painful, but was noticeably improved with throat lozenges given to her by her son.  She has been experiencing difficulty speaking due to loss of voice. She states that it feels like a sinus infection. Difficulty breathing is present when sleeping and when she first wakes up in the morning.  The ingrown toenail that was removed last time has grown back. It does not always feel as bad as it did previously, but it does occasionally have pain, which is then similar in quality and level. Currently, she is not experiencing pain regarding this fungal infection. A callus is noted on the 2nd toe of her right foot.  She has tried the nasal spray Astelin which alleviates her rhinorrhea, but says it does not improve her cough.        Objective:  Physical Exam: BP 138/79   Pulse 88   Temp 98 F (36.7 C) (Temporal)   Ht 5\' 2"  (1.575 m)   Wt 87 lb (39.5 kg)   SpO2 95%   BMI 15.91 kg/m   Gen: No acute distress, resting comfortably HEENT: TMs with clear effusion.  OP erythematous.  Nasal mucosa erythematous and boggy with clear discharge. CV: Regular rate and rhythm with no murmurs appreciated Pulm: Normal work of breathing, clear to auscultation bilaterally with no  crackles, wheezes, or rhonchi MSK: Onychomycosis in bilateral feet noted.  Neuro: Grossly normal, moves all extremities Psych: Normal affect and thought content      Latrece Nitta M. Jerline Pain, MD 10/12/2021 3:49 PM

## 2021-10-19 ENCOUNTER — Other Ambulatory Visit: Payer: Self-pay

## 2021-10-19 ENCOUNTER — Ambulatory Visit (INDEPENDENT_AMBULATORY_CARE_PROVIDER_SITE_OTHER): Payer: Medicare Other | Admitting: Podiatry

## 2021-10-19 DIAGNOSIS — D2371 Other benign neoplasm of skin of right lower limb, including hip: Secondary | ICD-10-CM | POA: Diagnosis not present

## 2021-10-19 NOTE — Progress Notes (Signed)
   Subjective: 87 y.o. female presenting to the office today for evaluation of a symptomatic callus to the distal tip of the right second toe.  It is very tender to palpation with closed toed shoes.  She presents for further treatment evaluation   Past Medical History:  Diagnosis Date   CAD (coronary artery disease)    CELLULITIS 06/18/2008   CHF (congestive heart failure) ([place])    CHRONIC OBSTRUCTIVE PULMONARY DISEASE, ACUTE EXACERBATION 11/16/2007   COPD 05/18/2007   COVID-19    GERD 12/25/2007   HYPERLIPIDEMIA 05/18/2007   HYPOTHYROIDISM 05/18/2007   Macular degeneration    Stroke ([place]) 08/2018   Urosepsis      Objective:  Physical Exam General: Alert and oriented x3 in no acute distress  Dermatology: Hyperkeratotic lesion(s) present on the distal tip of the right second toe. Pain on palpation with a central nucleated core noted. Skin is warm, dry and supple bilateral lower extremities. Negative for open lesions or macerations.  Vascular: Palpable pedal pulses bilaterally. No edema or erythema noted. Capillary refill within normal limits.  Neurological: Epicritic and protective threshold grossly intact bilaterally.   Musculoskeletal Exam: Pain on palpation at the keratotic lesion(s) noted. Range of motion within normal limits bilateral. Muscle strength 5/5 in all groups bilateral.  Assessment: 1. Preulcerative callus of distal tip of second toe right foot   Plan of Care:  1. Patient evaluated 2. Excisional debridement of keratoic lesion(s) using a chisel blade was performed without incident.  3. Recommend shoes that do not irritate the foot the foot 4. Patient is to return to the clinic PRN.   Edrick Kins, DPM [facility]  Dr. Edrick Kins, [address]                                        [place], [place] [address]                Office [phone]  Fax [phone]

## 2021-12-01 DIAGNOSIS — Z23 Encounter for immunization: Secondary | ICD-10-CM | POA: Diagnosis not present

## 2021-12-23 ENCOUNTER — Telehealth: Payer: Self-pay

## 2021-12-23 NOTE — Telephone Encounter (Signed)
Holly [name] tested positive for COVID yesterday. She has fever, headache and started coughing and spitting up phlegm.

## 2021-12-25 NOTE — Telephone Encounter (Signed)
Pt is feeling better, fever dropped and is eating and sitting up in the bed. Will let us know if she gets worse

## 2021-12-25 NOTE — Telephone Encounter (Signed)
Can we call and check on patient? If her symptoms are worsening recommend she get evaluated.  [name]. Jerline Pain, MD 12/25/2021 4:02 PM

## 2022-02-24 ENCOUNTER — Other Ambulatory Visit: Payer: Self-pay | Admitting: Adult Health

## 2022-03-08 IMAGING — CR DG LUMBAR SPINE COMPLETE 4+V
5 series · 5 of 5 positions shown · non-contrast
Comparison: None.

CLINICAL DATA: Back pain

EXAM:
LUMBAR SPINE - COMPLETE 4+ VIEW

[l-spine ap]
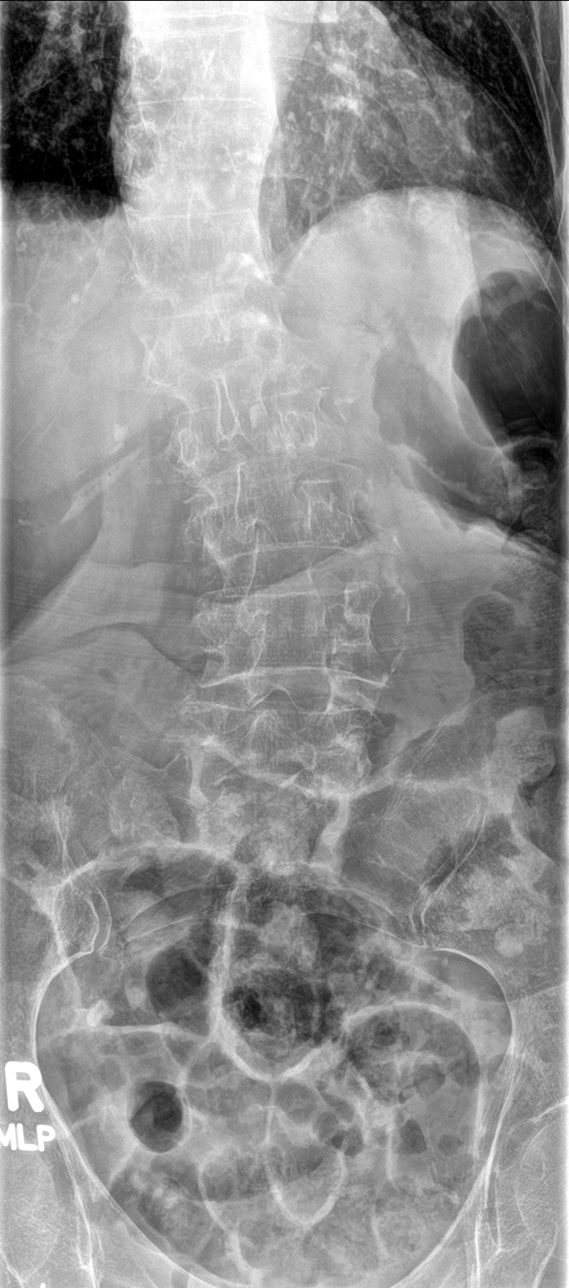

[l-spine obl (1 of 2)]
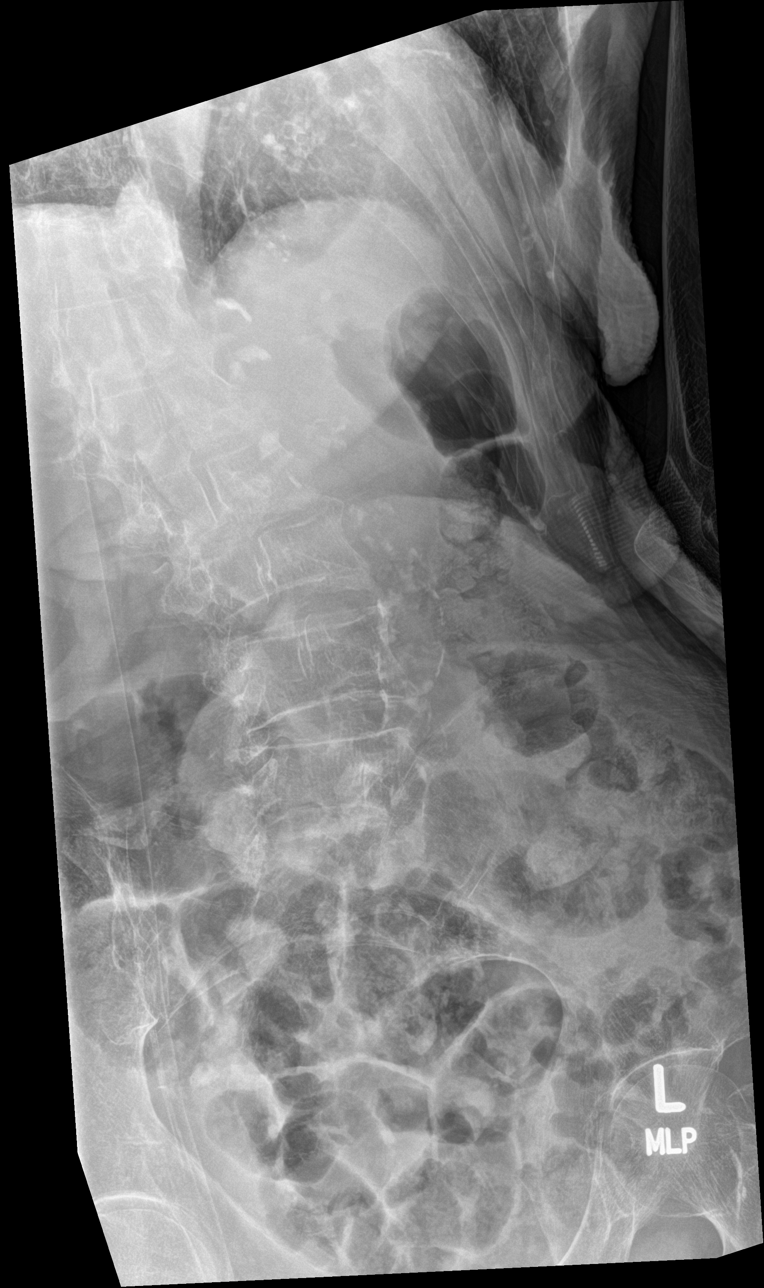

[l-spine lat]
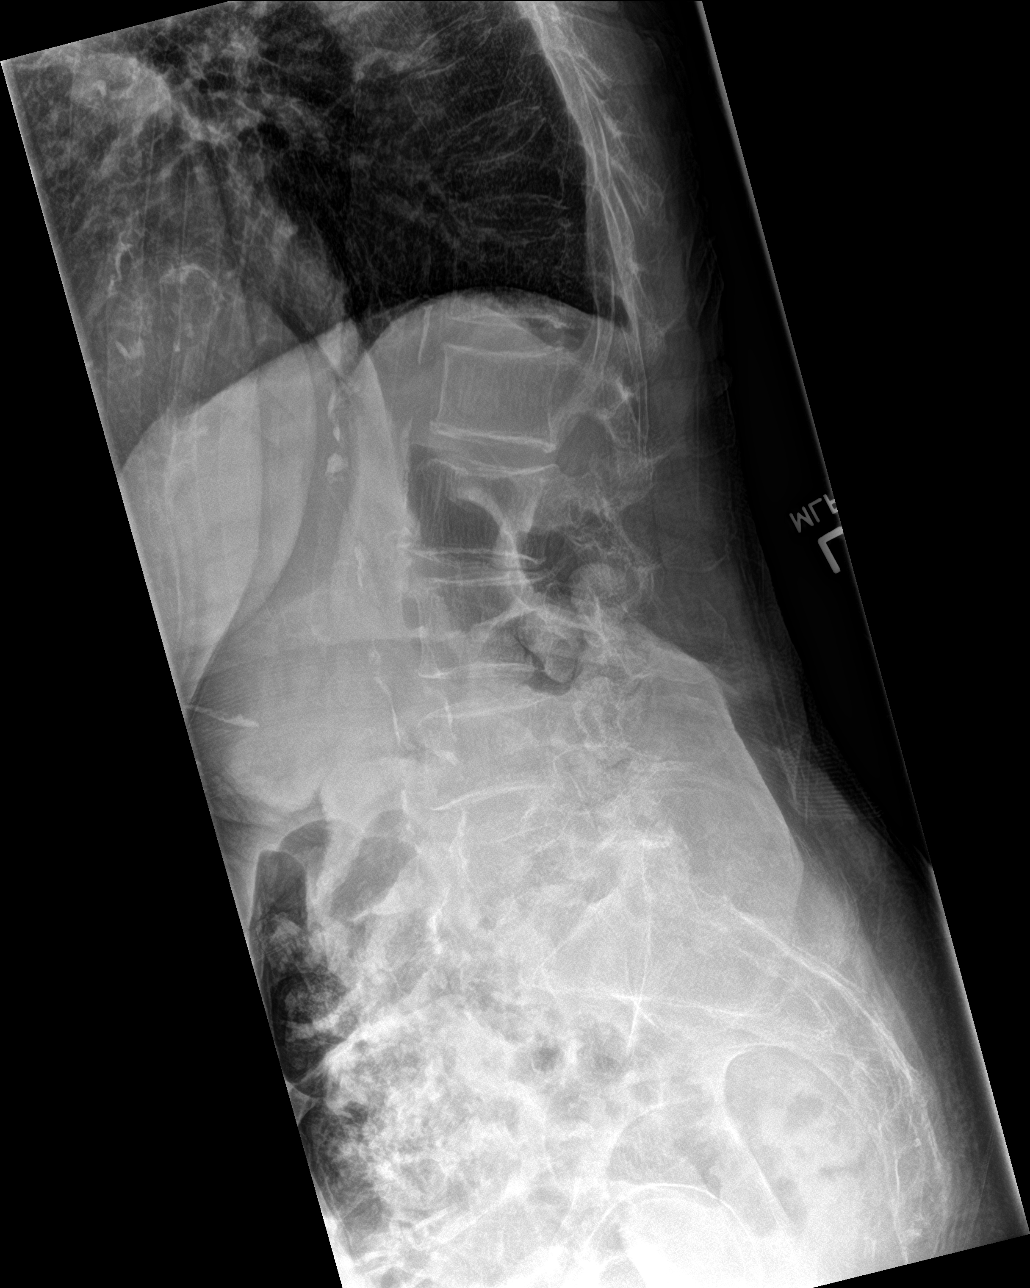

[l-spine spot]
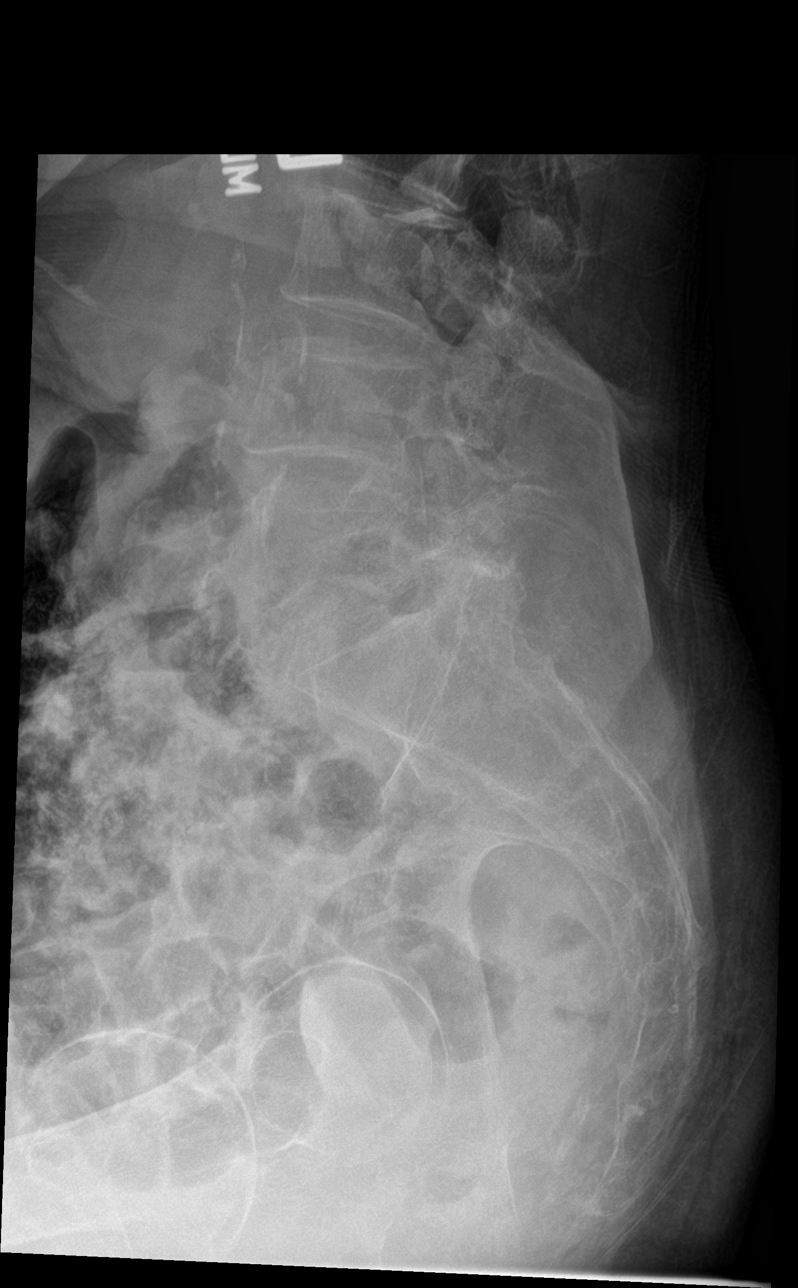

[l-spine obl (2 of 2)]
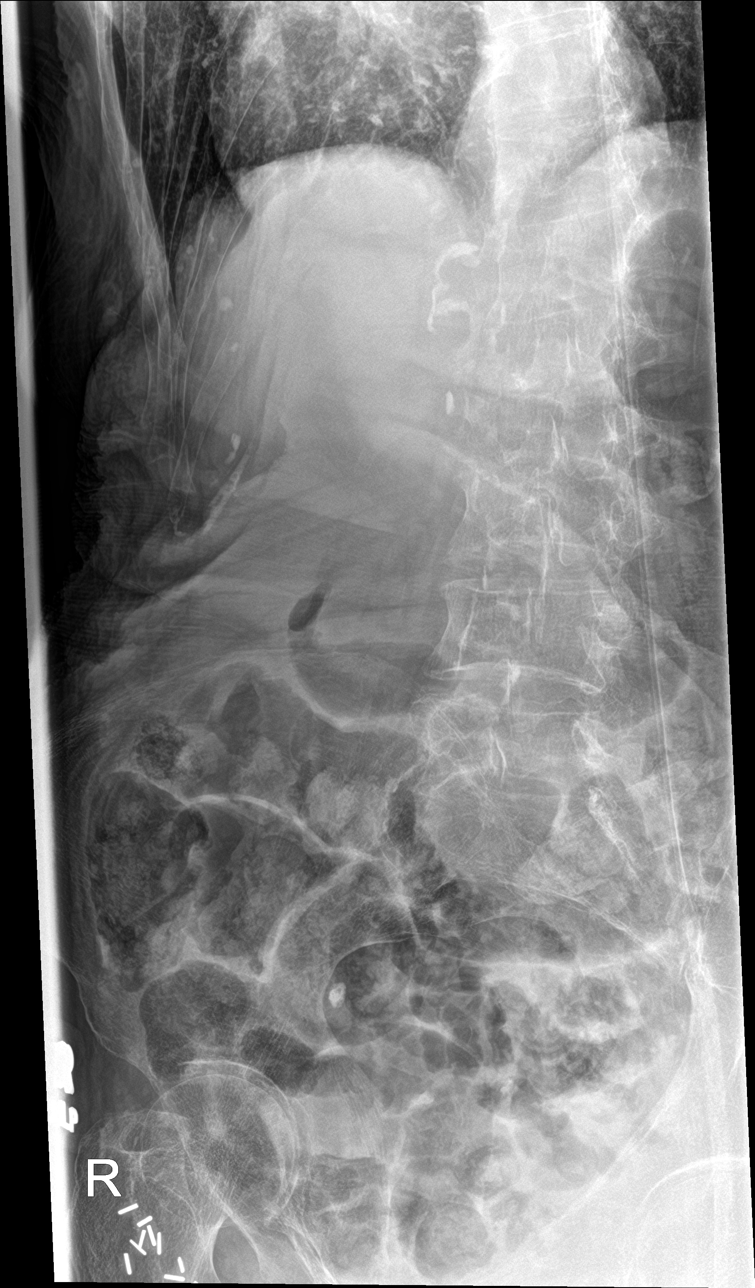

[5 of 5 positions shown; findings below may reference images not displayed]

FINDINGS: There is diffuse osteopenia which limits detection of nondisplaced
fractures. There is some mild age-indeterminate height loss of the
L5 vertebral body. There is age-indeterminate height loss of the T11
and T10 vertebral bodies. Multilevel disc height loss is noted
throughout the lumbar spine with multilevel facet arthrosis.
Vascular calcifications are noted of the abdominal aorta. There is a
degenerative levoscoliosis centered in the mid lumbar spine.
IMPRESSION: 1. Detection of fractures is severely limited by diffuse osteopenia.
2. There is age-indeterminate height loss of the T10, T11, and
likely the L5 vertebral body.
3.  Aortic Atherosclerosis ([id number]).

## 2022-03-31 ENCOUNTER — Other Ambulatory Visit: Payer: Self-pay | Admitting: Family Medicine

## 2022-04-16 ENCOUNTER — Encounter (HOSPITAL_COMMUNITY): Payer: Self-pay | Admitting: Emergency Medicine

## 2022-04-16 ENCOUNTER — Emergency Department (HOSPITAL_COMMUNITY): Payer: Medicare Other

## 2022-04-16 ENCOUNTER — Other Ambulatory Visit: Payer: Self-pay

## 2022-04-16 ENCOUNTER — Emergency Department (HOSPITAL_COMMUNITY)
Admission: EM | Admit: 2022-04-16 | Discharge: 2022-04-16 | Disposition: A | Discharge status: Home / Self Care | Payer: Medicare Other | Attending: Emergency Medicine | Admitting: Emergency Medicine

## 2022-04-16 ENCOUNTER — Telehealth: Payer: Self-pay

## 2022-04-16 DIAGNOSIS — R441 Visual hallucinations: Secondary | ICD-10-CM | POA: Diagnosis not present

## 2022-04-16 DIAGNOSIS — R9431 Abnormal electrocardiogram [ECG] [EKG]: Secondary | ICD-10-CM | POA: Diagnosis not present

## 2022-04-16 DIAGNOSIS — E039 Hypothyroidism, unspecified: Secondary | ICD-10-CM | POA: Insufficient documentation

## 2022-04-16 DIAGNOSIS — I6782 Cerebral ischemia: Secondary | ICD-10-CM | POA: Diagnosis not present

## 2022-04-16 DIAGNOSIS — Z79899 Other long term (current) drug therapy: Secondary | ICD-10-CM | POA: Diagnosis not present

## 2022-04-16 DIAGNOSIS — Z8616 Personal history of COVID-19: Secondary | ICD-10-CM | POA: Insufficient documentation

## 2022-04-16 DIAGNOSIS — F29 Unspecified psychosis not due to a substance or known physiological condition: Secondary | ICD-10-CM | POA: Diagnosis not present

## 2022-04-16 DIAGNOSIS — Z7982 Long term (current) use of aspirin: Secondary | ICD-10-CM | POA: Diagnosis not present

## 2022-04-16 DIAGNOSIS — J449 Chronic obstructive pulmonary disease, unspecified: Secondary | ICD-10-CM | POA: Diagnosis not present

## 2022-04-16 DIAGNOSIS — Z7951 Long term (current) use of inhaled steroids: Secondary | ICD-10-CM | POA: Diagnosis not present

## 2022-04-16 DIAGNOSIS — R443 Hallucinations, unspecified: Secondary | ICD-10-CM | POA: Diagnosis present

## 2022-04-16 DIAGNOSIS — I509 Heart failure, unspecified: Secondary | ICD-10-CM | POA: Insufficient documentation

## 2022-04-16 DIAGNOSIS — I2510 Atherosclerotic heart disease of native coronary artery without angina pectoris: Secondary | ICD-10-CM | POA: Insufficient documentation

## 2022-04-16 LAB — CBC WITH DIFFERENTIAL/PLATELET
Abs Immature Granulocytes: 0.01 10*3/uL (ref 0.00–0.07)
Basophils Absolute: 0.1 10*3/uL (ref 0.0–0.1)
Basophils Relative: 1 %
Eosinophils Absolute: 0.2 10*3/uL (ref 0.0–0.5)
Eosinophils Relative: 5 %
HCT: 37.5 % (ref 36.0–46.0)
Hemoglobin: 11.8 g/dL — ABNORMAL LOW (ref 12.0–15.0)
Immature Granulocytes: 0 %
Lymphocytes Relative: 24 %
Lymphs Abs: 1.1 10*3/uL (ref 0.7–4.0)
MCH: 30.3 pg (ref 26.0–34.0)
MCHC: 31.5 g/dL (ref 30.0–36.0)
MCV: 96.4 fL (ref 80.0–100.0)
Monocytes Absolute: 0.6 10*3/uL (ref 0.1–1.0)
Monocytes Relative: 12 %
Neutro Abs: 2.7 10*3/uL (ref 1.7–7.7)
Neutrophils Relative %: 58 %
Platelets: 207 10*3/uL (ref 150–400)
RBC: 3.89 MIL/uL (ref 3.87–5.11)
RDW: 15.1 % (ref 11.5–15.5)
WBC: 4.7 10*3/uL (ref 4.0–10.5)
nRBC: 0 % (ref 0.0–0.2)

## 2022-04-16 LAB — RAPID URINE DRUG SCREEN, HOSP PERFORMED
Amphetamines: NOT DETECTED
Barbiturates: NOT DETECTED
Benzodiazepines: NOT DETECTED
Cocaine: NOT DETECTED
Opiates: NOT DETECTED
Tetrahydrocannabinol: NOT DETECTED

## 2022-04-16 LAB — URINALYSIS, ROUTINE W REFLEX MICROSCOPIC
Bilirubin Urine: NEGATIVE
Glucose, UA: NEGATIVE mg/dL
Hgb urine dipstick: NEGATIVE
Ketones, ur: NEGATIVE mg/dL
Nitrite: NEGATIVE
Protein, ur: NEGATIVE mg/dL
Specific Gravity, Urine: 1.01 (ref 1.005–1.030)
pH: 6 (ref 5.0–8.0)

## 2022-04-16 LAB — COMPREHENSIVE METABOLIC PANEL
ALT: 11 U/L (ref 0–44)
AST: 25 U/L (ref 15–41)
Albumin: 3.8 g/dL (ref 3.5–5.0)
Alkaline Phosphatase: 91 U/L (ref 38–126)
Anion gap: 10 (ref 5–15)
BUN: 23 mg/dL (ref 8–23)
CO2: 25 mmol/L (ref 22–32)
Calcium: 9.3 mg/dL (ref 8.9–10.3)
Chloride: 102 mmol/L (ref 98–111)
Creatinine, Ser: 1.3 mg/dL — ABNORMAL HIGH (ref 0.44–1.00)
GFR, Estimated: 39 mL/min — ABNORMAL LOW (ref >60)
Glucose, Bld: 94 mg/dL (ref 70–99)
Potassium: 4.4 mmol/L (ref 3.5–5.1)
Sodium: 137 mmol/L (ref 135–145)
Total Bilirubin: 0.7 mg/dL (ref 0.3–1.2)
Total Protein: 7 g/dL (ref 6.5–8.1)

## 2022-04-16 LAB — T4, FREE: Free T4: 1.45 ng/dL — ABNORMAL HIGH (ref 0.61–1.12)

## 2022-04-16 LAB — TSH: TSH: 2.315 u[IU]/mL (ref 0.350–4.500)

## 2022-04-16 LAB — AMMONIA: Ammonia: 15 umol/L (ref 9–35)

## 2022-04-16 LAB — ETHANOL: Alcohol, Ethyl (B): <10 mg/dL (ref <10)

## 2022-04-16 NOTE — ED Provider Notes (Signed)
[facility] EMERGENCY DEPARTMENT Provider Note   CSN: [id number] Arrival date & time: 04/16/22  1517     History  Chief Complaint  Patient presents with   Hallucinations    Holly [name] is a 88 y.o. female.  HPI      88 year old female with a history of coronary artery disease, CHF, COPD, hyperlipidemia, CVA, severe macular degeneration, presents with concern for hallucinations for the past 2 weeks.  Reports over the last 2 weeks she been having very vivid hallucinations, seeing things like seaweed coming and moving out of her TV, see creatures sitting around on the walls.  She recognized that these are hallucinations and they are not actually happening, but is continuing to see them.  She otherwise denies any acute changes.  No headaches, nausea, vomiting, urinary symptoms, fevers.  Denies numbness, weakness, difficulty talking, walking, or visual changes other than continued visual changes related to her macular degeneration degeneration.  Denies alcohol or drug use, possibility of withdrawal from medications, or other new medications.  Daughter and patient denies any signs of memory problems.  No history of trauma.  Past Medical History:  Diagnosis Date   CAD (coronary artery disease)    CELLULITIS 06/18/2008   CHF (congestive heart failure) ([place])    CHRONIC OBSTRUCTIVE PULMONARY DISEASE, ACUTE EXACERBATION 11/16/2007   COPD 05/18/2007   COVID-19    GERD 12/25/2007   HYPERLIPIDEMIA 05/18/2007   HYPOTHYROIDISM 05/18/2007   Macular degeneration    Stroke ([place]) 08/2018   Urosepsis      Home Medications Prior to Admission medications   Medication Sig Start Date End Date Taking? Authorizing Provider  acetaminophen (TYLENOL) 500 MG tablet Take 500 mg by mouth every 6 (six) hours as needed for mild pain.     [provider]  albuterol (PROVENTIL HFA) 108 (90 Base) MCG/ACT inhaler Inhale 1-2 puffs into the lungs every 6 (six) hours as needed for  wheezing or shortness of breath. 10/12/21   Vivi Barrack, MD  aspirin EC 81 MG tablet Take 1 tablet (81 mg total) by mouth daily. Swallow whole. 02/11/21   Frann Rider, NP  atorvastatin (LIPITOR) 40 MG tablet Take 1 tablet (40 mg total) by mouth daily. 10/12/21   Vivi Barrack, MD  azelastine (ASTELIN) 0.1 % nasal spray Place 2 sprays into both nostrils 2 (two) times daily. 10/12/21   Vivi Barrack, MD  azithromycin [facility]) 250 MG tablet Take 2 tabs day 1, then 1 tab daily 10/12/21   Vivi Barrack, MD  Fluticasone-Salmeterol [facility] INHUB) 500-50 MCG/DOSE AEPB Inhale 1 puff into the lungs 2 (two) times daily. Advair 02/05/21 07/08/21  Vivi Barrack, MD  levothyroxine (SYNTHROID) 100 MCG tablet TAKE 1 TABLET BY MOUTH EVERY DAY 03/31/22   Vivi Barrack, MD  Multiple Vitamins-Minerals (ICAPS PO) Take 1 capsule by mouth 2 (two) times daily.    [provider]      Allergies    Levofloxacin, Macrobid [nitrofurantoin macrocrystal], Sulfamethoxazole, Tramadol, and Amoxicillin    Review of Systems   Review of Systems  Physical Exam Updated Vital Signs BP (!) 166/73 (BP Location: Right Arm)   Pulse 61   Temp 98.3 F (36.8 C) (Oral)   Resp 18   SpO2 100%  Physical Exam Vitals and nursing note reviewed.  Constitutional:      General: She is not in acute distress.    Appearance: Normal appearance. She is well-developed. She is not ill-appearing or diaphoretic.  HENT:     Head: Normocephalic and atraumatic.  Eyes:     General: No visual field deficit.    Extraocular Movements: Extraocular movements intact.     Conjunctiva/sclera: Conjunctivae normal.     Pupils: Pupils are equal, round, and reactive to light.  Cardiovascular:     Rate and Rhythm: Normal rate and regular rhythm.     Pulses: Normal pulses.     Heart sounds: Normal heart sounds. No murmur heard.   No friction rub. No gallop.  Pulmonary:     Effort: Pulmonary effort is normal. No respiratory distress.      Breath sounds: Normal breath sounds. No wheezing or rales.  Abdominal:     General: There is no distension.     Palpations: Abdomen is soft.     Tenderness: There is no abdominal tenderness. There is no guarding.  Musculoskeletal:        General: No swelling or tenderness.     Cervical back: Normal range of motion.  Skin:    General: Skin is warm and dry.     Findings: No erythema or rash.  Neurological:     General: No focal deficit present.     Mental Status: She is alert and oriented to person, place, and time.     GCS: GCS eye subscore is 4. GCS verbal subscore is 5. GCS motor subscore is 6.     Cranial Nerves: No cranial nerve deficit, dysarthria or facial asymmetry.     Sensory: No sensory deficit.     Motor: No weakness or tremor.     Coordination: Coordination normal. Finger-Nose-Finger Test normal.     Gait: Gait normal.     Comments: +visual hallucinations noted while examining pt    ED Results / Procedures / Treatments   Labs (all labs ordered are listed, but only abnormal results are displayed) Labs Reviewed  COMPREHENSIVE METABOLIC PANEL - Abnormal; Notable for the following components:      Result Value   Creatinine, Ser 1.30 (*)    GFR, Estimated 39 (*)    All other components within normal limits  CBC WITH DIFFERENTIAL/PLATELET - Abnormal; Notable for the following components:   Hemoglobin 11.8 (*)    All other components within normal limits  URINALYSIS, ROUTINE W REFLEX MICROSCOPIC - Abnormal; Notable for the following components:   Leukocytes,Ua TRACE (*)    Bacteria, UA RARE (*)    All other components within normal limits  T4, FREE - Abnormal; Notable for the following components:   Free T4 1.45 (*)    All other components within normal limits  TSH  RAPID URINE DRUG SCREEN, HOSP PERFORMED  ETHANOL  AMMONIA    EKG EKG Interpretation  Date/Time:  Friday Apr 16 2022 18:50:13 EDT Ventricular Rate:  61 PR Interval:  149 QRS Duration: 130 QT  Interval:  444 QTC Calculation: 448 R Axis:   -43 Text Interpretation: Sinus rhythm RBBB and LAFB No significant change since last tracing Confirmed by Gareth Morgan [id number]) on 04/16/2022 6:52:51 PM  Radiology MR BRAIN WO CONTRAST  Result Date: 04/16/2022 CLINICAL DATA:  Psychosis EXAM: MRI HEAD WITHOUT CONTRAST TECHNIQUE: Multiplanar, multiecho pulse sequences of the brain and surrounding structures were obtained without intravenous contrast. COMPARISON:  2019 FINDINGS: Brain: There is no acute infarction or intracranial hemorrhage. There is no intracranial mass, mass effect, or edema. There is no hydrocephalus or extra-axial fluid collection. Prominence of the ventricles and sulci reflects parenchymal volume loss. There is mild  parietal dominance. Patchy and confluent areas of T2 hyperintensity in the supratentorial white matter are nonspecific but probably reflect moderate chronic microvascular ischemic changes. A punctate focus of susceptibility in the lateral left frontoparietal region is most compatible with a chronic microhemorrhage. Vascular: Major vessel flow voids at the skull base are preserved. Skull and upper cervical spine: Normal marrow signal is preserved. Sinuses/Orbits: Paranasal sinuses are aerated. Bilateral lens replacements. Other: Sella is unremarkable.  Mastoid air cells are clear. IMPRESSION: No acute infarction, hemorrhage, or mass. Moderate chronic microvascular ischemic changes. Electronically Signed   By: Macy Mis M.D.   On: 04/16/2022 18:39    Procedures Procedures    Medications Ordered in ED Medications - No data to display  ED Course/ Medical Decision Making/ A&P                           Medical Decision Making Amount and/or Complexity of Data Reviewed Labs: ordered. Radiology: ordered.    88 year old female with a history of coronary artery disease, CHF, COPD, hyperlipidemia, CVA, severe macular degeneration, presents with concern for  hallucinations for the past 2 weeks.  DDx includes CVA, other structural central etiology, delirium, dementia, medication effect or withdrawal, Charles Bonnet syndrome.  History of consistent visual hallucinations over the last 2 weeks, without other signs of delirium.  Labs obtained and show no significant electrolyte abnormalities, No leukocytosis.  TSH is within normal limits.  Ammonia and alcohol levels are also within normal limits.  Urinalysis not consistent with a urinary tract infection.  She otherwise does not have any other infectious symptoms.  Given her history of stroke, MRI was ordered and showed no evidence of acute abnormalities or CVA.  Pt and family deny signs of memory problems. Discussed that it is possible that her visual hallucinations in the setting of her macular degeneration may reflect Hilario Quarry syndrome, recommend follow-up with neurology and ophthalmology as well as her PCP. Do not see indication for admission. Patient discharged in stable condition with understanding of reasons to return.          Final Clinical Impression(s) / ED Diagnoses Final diagnoses:  Visual hallucinations    Rx / DC Orders ED Discharge Orders     None         Gareth Morgan, MD 04/18/22 [phone]

## 2022-04-16 NOTE — ED Provider Triage Note (Signed)
Emergency Medicine Provider Triage Evaluation Note  Holly [name] , [name] 88 y.o. female  was evaluated in triage.  Pt complains of visual hallucinations onset 2 weeks.  Patient notes that she is seeing sea creatures.  She endorses seeing sea creatures, while in the room. Patient's daughter notes that patient appears to be at her baseline until she begins to have hallucinations.  She was told to come into the emergency department for further evaluation of her symptoms. Patient notes associated burning with urination.  No meds tried prior to arrival.  Denies chest pain, shortness of breath, fever, chills, nausea, vomiting, hematuria.  Review of Systems  Positive: As per HPI above Negative:   Physical Exam  BP (!) 158/82 (BP Location: Right Arm)   Pulse 88   Temp 97.7 F (36.5 C) (Oral)   Resp 15   SpO2 95%  Gen:   Awake, no distress   Resp:  Normal effort  MSK:   Moves extremities without difficulty  Other:  Negative pronator drift.  No focal neurological deficits on exam.  Medical Decision Making  Medically screening exam initiated at 4:20 PM.  Appropriate orders placed.  Holly [name] was informed that the remainder of the evaluation will be completed by another provider, this initial triage assessment does not replace that evaluation, and the importance of remaining in the ED until their evaluation is complete.  4:24 PM - Discussed with RN that patient is in need of [name] room immediately. RN aware and working on room placement.     Holly [name] A, PA-C 04/16/22 1624

## 2022-04-16 NOTE — ED Notes (Signed)
Pt ambulated to the car. Daughter was the driver. Pt expresses no concerns at this time.

## 2022-04-16 NOTE — Telephone Encounter (Signed)
Patient sent to ED    Patient Name: Holly [name] Gender: Female DOB: 24-Jun-1928 Age: 88 Y 3 M 15 D Return Phone Number: [phone] (Primary), [phone] (Secondary) Address: City/ State/ Zip: [place] [address] Client [facility] [employer] [facility] Provider Jacquiline Doe- MD Contact Type Call Who Is Calling Patient / Member / Family / Caregiver Call Type Triage / Clinical Caller Name Gloriann Loan Relationship To Patient Grandchild Return Phone Number [phone] (Secondary) Chief Complaint CONFUSION - new onset Reason for Call Symptomatic / Request for Health Information Initial Comment Her grandmother is have illusions of sea creatures come out of her tv, vivied colors. She is seeing things when she looks around eveywhere. Translation No Nurse Assessment Nurse: Stefano Gaul, RN, Dwana Curd Date/Time (Eastern Time): 04/16/2022 12:24:29 PM Confirm and document reason for call. If symptomatic, describe symptoms. ---Caller states she is seeing worms on her front porch. she is seeing trees that aren't there. She was watching TV and she saw creatures coming out of the TV. Has been seeing thing about 2 weeks. Does the patient have any new or worsening symptoms? ---Yes Will a triage be completed? ---Yes Related visit to physician within the last 2 weeks? ---No Does the PT have any chronic conditions? (i.e. diabetes, asthma, this includes High risk factors for pregnancy, etc.) ---Yes List chronic conditions. ---hypothyroidism; macular degeneration Is this a behavioral health or substance abuse call? ---No Nurse: Lexine Baton, RN, Belenda Cruise Date/Time (Eastern Time): 04/16/2022 12:40:18 PM Confirm and document reason for call. If symptomatic, describe symptoms. ---Caller states she is seeing some things coming out of the tv and floating in the air. Does the patient have any new or worsening symptoms?  ---Yes Will a triage be completed? ---Yes Related visit to physician within the last 2 weeks? ---No PLEASE NOTE: All timestamps contained within this report are represented as [place] Standard Time. CONFIDENTIALTY NOTICE: This fax transmission is intended only for the addressee. It contains information that is legally privileged, confidential or otherwise protected from use or disclosure. If you are not the intended recipient, you are strictly prohibited from reviewing, disclosing, copying using or disseminating any of this information or taking any action in reliance on or regarding this information. If you have received this fax in error, please notify us immediately by telephone so that we can arrange for its return to [place]. Phone: [phone], Toll-Free: [phone], Fax: [phone] Page: 2 of 3 Call Id: [id number] Nurse Assessment Does the PT have any chronic conditions? (i.e. diabetes, asthma, this includes High risk factors for pregnancy, etc.) ---No Is this a behavioral health or substance abuse call? ---No Guidelines Guideline Title Affirmed Question Affirmed Notes Nurse Date/Time Lamount Cohen Time) Vision Loss or Change [1] Blurred vision or visual changes AND [2] present now AND [3] sudden onset or new (e.g., minutes, hours, days) (Exception: seeing floaters / black specks OR previously diagnosed migraine headaches with same symptoms) Lexine Baton, RN, Belenda Cruise 04/16/2022 12:42:37 PM Disp. Time Lamount Cohen Time) Disposition Final User 04/16/2022 12:21:49 PM Send to Urgent Queue Salvatore Marvel 04/16/2022 12:38:53 PM Send To RN Personal Stefano Gaul, RN, Dwana Curd 04/16/2022 12:45:40 PM Go to ED Now (or PCP triage) Yes Lexine Baton, RN, Edd Arbour Disagree/Comply Comply Caller Understands Yes PreDisposition Did not know what to do Care Advice Given Per Guideline GO TO ED NOW (OR PCP TRIAGE): * IF NO PCP (PRIMARY CARE PROVIDER) SECOND-LEVEL TRIAGE: You need to be seen within the next hour.  Go  to the ED/UCC at _____________ Hospital. Leave as soon as you can. ANOTHER ADULT SHOULD DRIVE: * It is better and safer if another adult drives instead of you. CARE ADVICE given per Vision Loss or Change (Adult) guideline. Comments User: Art Buff, RN Date/Time Lamount Cohen Time): 04/16/2022 12:26:22 PM PLEASE NOTE: All timestamps contained within this report are represented as [place] Standard Time. CONFIDENTIALTY NOTICE: This fax transmission is intended only for the addressee. It contains information that is legally privileged, confidential or otherwise protected from use or disclosure. If you are not the intended recipient, you are strictly prohibited from reviewing, disclosing, copying using or disseminating any of this information or taking any action in reliance on or regarding this information. If you have received this fax in error, please notify us immediately by telephone so that we can arrange for its return to [place]. Phone: [phone], Toll-Free: [phone], Fax: [phone] Page: 3 of 3 Call Id: [id number] Comments Caller states she is not with her grandmother. her phone number of [phone] User: Art Buff, RN Date/Time Lamount Cohen Time): 04/16/2022 38:75:64 PM pt's phone number is [phone] Referrals [facility] - ED

## 2022-04-16 NOTE — Discharge Instructions (Signed)
It is possible your visual hallucinations are secondary to [place] in the setting of your macular degeneration.  Please follow up with your Neurologist and Ophthalmologist.

## 2022-04-16 NOTE — Telephone Encounter (Signed)
Please see note; Pt sent to ED

## 2022-04-16 NOTE — ED Triage Notes (Signed)
Patient coming from home, complaint of visual hallucinations for approx 1 week. Pt seeing "sea creatures, and men flying in the air." Endorses history of macular degeneration. VSS. NAD.

## 2022-04-16 NOTE — Telephone Encounter (Signed)
Granddaughter has called in stating that patient is seeing sea creatures come out of her TV.  I have sent over to triage.

## 2022-04-19 ENCOUNTER — Other Ambulatory Visit: Payer: Self-pay | Admitting: Family Medicine

## 2022-04-19 ENCOUNTER — Encounter: Payer: Self-pay | Admitting: Family Medicine

## 2022-04-27 ENCOUNTER — Ambulatory Visit (INDEPENDENT_AMBULATORY_CARE_PROVIDER_SITE_OTHER): Payer: Medicare Other | Admitting: Family Medicine

## 2022-04-27 ENCOUNTER — Encounter: Payer: Self-pay | Admitting: Family Medicine

## 2022-04-27 VITALS — BP 162/73 | HR 63 | Temp 97.3°F | Ht 62.0 in | Wt 87.2 lb

## 2022-04-27 DIAGNOSIS — H3530 Unspecified macular degeneration: Secondary | ICD-10-CM | POA: Diagnosis not present

## 2022-04-27 DIAGNOSIS — R441 Visual hallucinations: Secondary | ICD-10-CM | POA: Diagnosis not present

## 2022-04-27 DIAGNOSIS — G3184 Mild cognitive impairment, so stated: Secondary | ICD-10-CM | POA: Diagnosis not present

## 2022-04-27 DIAGNOSIS — L821 Other seborrheic keratosis: Secondary | ICD-10-CM | POA: Diagnosis not present

## 2022-04-27 NOTE — Progress Notes (Signed)
   Holly [name] is a 88 y.o. female who presents today for an office visit.  Assessment/Plan:  New/Acute Problems: Visual Hallucination Concern for Charles Bonnett syndrome.  She had work-up in the ED including labs and MRI which is negative.  Reassuring exam here.  Symptoms have improved the last couple of days.  They would like to follow-up with neurology.  We will place referral today.  We discussed reasons to return to care.  Chronic Problems Addressed Today: Seborrheic keratoses We will refer to dermatology.  Mild cognitive impairment We will place referral to neurology to further evaluate.  Macular degeneration She has follow-up upcoming with ophthalmology.       Subjective:  HPI:  Patient here for ED follow-up.  Went to the ED on 04/16/2022 with worsening visual hallucinations for a couple of weeks. She was seeing "wiggly" lines coming out of the TV as well as when looking out into the distance. Had work-up in the ED including labs and MRI.  None of these showed any acute abnormalities.  It was thought that her hallucinations may have been due to her macular degeneration and Charles Bonnett syndrome. She was discharged home with recommendation to follow up with neurology.   Over the last couple of weeks, she has had continued to have visual hallucinations however it seems to be getting better. She is aware of what she is seeing is not real. She is not hearing any auditory hallucinations.       Objective:  Physical Exam: BP (!) 162/73   Pulse 63   Temp (!) 97.3 F (36.3 C) (Temporal)   Ht '5\' 2"'$  (1.575 m)   Wt 87 lb 3.2 oz (39.6 kg)   SpO2 98%   BMI 15.95 kg/m   Wt Readings from Last 3 Encounters:  04/27/22 87 lb 3.2 oz (39.6 kg)  10/12/21 87 lb (39.5 kg)  07/20/21 90 lb (40.8 kg)    Gen: No acute distress, resting comfortably CV: Regular rate and rhythm with no murmurs appreciated Pulm: Normal work of breathing, clear to auscultation bilaterally with no crackles,  wheezes, or rhonchi Neuro: CN2-12 intact.  Strength 5 out of 5 in all extremities.  No apparent audio or visual hallucinations. Psych: Normal affect and thought content  Time Spent: 45 minutes of total time was spent on the date of the encounter performing the following actions: chart review prior to seeing the patient including recent ED visit, obtaining history, performing a medically necessary exam, counseling on the treatment plan, placing orders, and documenting in our EHR.        [name]. Jerline Pain, MD 04/27/2022 12:37 PM

## 2022-04-27 NOTE — Assessment & Plan Note (Signed)
We will refer to dermatology. 

## 2022-04-27 NOTE — Progress Notes (Deleted)
   Holly [name] is a 88 y.o. female who presents today for an office visit.  Assessment/Plan:  New/Acute Problems: ***  Chronic Problems Addressed Today: No problem-specific Assessment & Plan notes found for this encounter.     Subjective:  HPI:  Patient here for ED follow-up.  Went to the ED on 04/16/2022 with worsening visual hallucinations.  Had work-up in the ED including labs and MRI.  None of these showed any acute abnormalities.  She was discharged home.       Objective:  Physical Exam: There were no vitals taken for this visit.  Gen: No acute distress, resting comfortably*** CV: Regular rate and rhythm with no murmurs appreciated Pulm: Normal work of breathing, clear to auscultation bilaterally with no crackles, wheezes, or rhonchi Neuro: Grossly normal, moves all extremities Psych: Normal affect and thought content      Adren Dollins M. Jerline Pain, MD 04/27/2022 7:59 AM

## 2022-04-27 NOTE — Patient Instructions (Signed)
It was very nice to see you today!  I am glad that you are doing well.  I will refer you to see the neurologist and dermatologist.  Take care, Dr Jerline Pain  PLEASE NOTE:  If you had any lab tests please let us know if you have not heard back within a few days. You may see your results on mychart before we have a chance to review them but we will give you a call once they are reviewed by [place]. If we ordered any referrals today, please let us know if you have not heard from their office within the next week.   Please try these tips to maintain a healthy lifestyle:  Eat at least 3 REAL meals and 1-2 snacks per day.  Aim for no more than 5 hours between eating.  If you eat breakfast, please do so within one hour of getting up.   Each meal should contain half fruits/vegetables, one quarter protein, and one quarter carbs (no bigger than a computer mouse)  Cut down on sweet beverages. This includes juice, soda, and sweet tea.   Drink at least 1 glass of water with each meal and aim for at least 8 glasses per day  Exercise at least 150 minutes every week.

## 2022-04-27 NOTE — Assessment & Plan Note (Signed)
She has follow-up upcoming with ophthalmology.

## 2022-04-27 NOTE — Assessment & Plan Note (Signed)
We will place referral to neurology to further evaluate.

## 2022-05-04 DIAGNOSIS — H43813 Vitreous degeneration, bilateral: Secondary | ICD-10-CM | POA: Diagnosis not present

## 2022-05-04 DIAGNOSIS — R441 Visual hallucinations: Secondary | ICD-10-CM | POA: Diagnosis not present

## 2022-05-04 DIAGNOSIS — H353233 Exudative age-related macular degeneration, bilateral, with inactive scar: Secondary | ICD-10-CM | POA: Diagnosis not present

## 2022-05-13 DIAGNOSIS — L821 Other seborrheic keratosis: Secondary | ICD-10-CM | POA: Diagnosis not present

## 2022-05-13 DIAGNOSIS — X32XXXA Exposure to sunlight, initial encounter: Secondary | ICD-10-CM | POA: Diagnosis not present

## 2022-05-13 DIAGNOSIS — L570 Actinic keratosis: Secondary | ICD-10-CM | POA: Diagnosis not present

## 2022-07-21 ENCOUNTER — Encounter: Payer: Self-pay | Admitting: Family Medicine

## 2022-07-22 NOTE — Telephone Encounter (Signed)
Please advise 

## 2022-07-23 NOTE — Telephone Encounter (Signed)
Ok to send in but not sure if insurance will pay for it because it is OTC.  [name]. Jerline Pain, MD 07/23/2022 10:28 AM

## 2022-08-06 ENCOUNTER — Encounter (HOSPITAL_BASED_OUTPATIENT_CLINIC_OR_DEPARTMENT_OTHER): Payer: Self-pay

## 2022-08-06 ENCOUNTER — Emergency Department (HOSPITAL_BASED_OUTPATIENT_CLINIC_OR_DEPARTMENT_OTHER)
Admission: EM | Admit: 2022-08-06 | Discharge: 2022-08-06 | Disposition: A | Discharge status: Home / Self Care | Payer: Medicare Other | Attending: Emergency Medicine | Admitting: Emergency Medicine

## 2022-08-06 ENCOUNTER — Emergency Department (HOSPITAL_BASED_OUTPATIENT_CLINIC_OR_DEPARTMENT_OTHER): Payer: Medicare Other

## 2022-08-06 ENCOUNTER — Other Ambulatory Visit: Payer: Self-pay

## 2022-08-06 DIAGNOSIS — W19XXXA Unspecified fall, initial encounter: Secondary | ICD-10-CM | POA: Insufficient documentation

## 2022-08-06 DIAGNOSIS — Z7982 Long term (current) use of aspirin: Secondary | ICD-10-CM | POA: Diagnosis not present

## 2022-08-06 DIAGNOSIS — Z79899 Other long term (current) drug therapy: Secondary | ICD-10-CM | POA: Diagnosis not present

## 2022-08-06 DIAGNOSIS — M189 Osteoarthritis of first carpometacarpal joint, unspecified: Secondary | ICD-10-CM | POA: Diagnosis not present

## 2022-08-06 DIAGNOSIS — M79632 Pain in left forearm: Secondary | ICD-10-CM | POA: Insufficient documentation

## 2022-08-06 DIAGNOSIS — R9431 Abnormal electrocardiogram [ECG] [EKG]: Secondary | ICD-10-CM | POA: Diagnosis not present

## 2022-08-06 DIAGNOSIS — M25532 Pain in left wrist: Secondary | ICD-10-CM | POA: Diagnosis not present

## 2022-08-06 NOTE — ED Triage Notes (Signed)
Patient states she had a mechanical trip and fall x today and is having jaw and left wrist pain. Patient denies loc. Patient is not on any blood thinners.

## 2022-08-06 NOTE — ED Provider Notes (Signed)
[facility] DEPARTMENT Provider Note   CSN: [id number] Arrival date & time: 08/06/22  1836     History  Chief Complaint  Patient presents with   Fall   Wrist Pain    JOURI THREAT is a 88 y.o. female.  Patient presents to the emergency department today for evaluation of left wrist and forearm pain starting acutely a couple of hours prior to arrival.  Patient states that she tripped over something in her yard that caused her to fall.  She sustained a minimal abrasion above her upper lip.  She did not lose consciousness.  She denies headache, confusion, vomiting, neck pain, weakness in the arms of the legs.  Pain is worse with movement.  No treatments prior to arrival.  She is not on anticoagulation.  No shoulder or elbow pain.       Home Medications Prior to Admission medications   Medication Sig Start Date End Date Taking? Authorizing Provider  acetaminophen (TYLENOL) 500 MG tablet Take 500 mg by mouth every 6 (six) hours as needed for mild pain.     [provider]  ADVAIR DISKUS 500-50 MCG/ACT AEPB INHALE 1 PUFF BY MOUTH 2 (TWO) TIMES DAILY. ADVAIR 04/19/22   Vivi Barrack, MD  albuterol (PROVENTIL HFA) 108 (90 Base) MCG/ACT inhaler Inhale 1-2 puffs into the lungs every 6 (six) hours as needed for wheezing or shortness of breath. 10/12/21   Vivi Barrack, MD  aspirin EC 81 MG tablet Take 1 tablet (81 mg total) by mouth daily. Swallow whole. 02/11/21   Frann Rider, NP  atorvastatin (LIPITOR) 40 MG tablet Take 1 tablet (40 mg total) by mouth daily. 10/12/21   Vivi Barrack, MD  azelastine (ASTELIN) 0.1 % nasal spray Place 2 sprays into both nostrils 2 (two) times daily. 10/12/21   Vivi Barrack, MD  levothyroxine (SYNTHROID) 100 MCG tablet TAKE 1 TABLET BY MOUTH EVERY DAY 03/31/22   Vivi Barrack, MD  Multiple Vitamins-Minerals (ICAPS PO) Take 1 capsule by mouth 2 (two) times daily.    [provider]      Allergies    Levofloxacin,  Macrobid [nitrofurantoin macrocrystal], Sulfamethoxazole, Tramadol, and Amoxicillin    Review of Systems   Review of Systems  Physical Exam Updated Vital Signs BP (!) 155/93   Pulse 77   Temp 98.2 F (36.8 C) (Oral)   Resp 19   Ht '5\' 2"'$  (1.575 m)   Wt 39.6 kg   SpO2 94%   BMI 15.97 kg/m   Physical Exam Vitals and nursing note reviewed.  Constitutional:      Appearance: She is well-developed.  HENT:     Head: Normocephalic.     Comments: Minimal superficial abrasion to the left upper lip area, no laceration    Right Ear: External ear normal.     Left Ear: External ear normal.     Nose: Nose normal. No congestion.  Eyes:     Pupils: Pupils are equal, round, and reactive to light.  Cardiovascular:     Pulses: Normal pulses. No decreased pulses.  Musculoskeletal:        General: Tenderness present.     Left elbow: Normal range of motion. No tenderness.     Left forearm: Tenderness present. No bony tenderness.     Left wrist: Tenderness and bony tenderness present. No deformity. Normal range of motion.     Left hand: No tenderness or bony tenderness. Normal range of motion.  Cervical back: Normal range of motion and neck supple.     Comments: Mild left wrist swelling. Patient voluntarily flexes and extends with some apparent discomfort.   Skin:    General: Skin is warm and dry.  Neurological:     Mental Status: She is alert.     Sensory: No sensory deficit.     Comments: Motor, sensation, and vascular distal to the injury is fully intact.   Psychiatric:        Mood and Affect: Mood normal.     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 EXAM: LEFT WRIST - COMPLETE 3+ VIEW COMPARISON:  None Available. FINDINGS: Osseous alignment is normal. Bone mineralization is normal. No fracture line or displaced fracture fragment. Degenerative osteoarthritic changes at the first [facility] joint, mild to moderate in degree. IMPRESSION: 1. No acute findings. No osseous fracture or dislocation. 2. Degenerative osteoarthritis at the first [facility] joint, mild to moderate in degree. Electronically Signed   By: Franki Cabot M.D.   On: 08/06/2022 19:43   DG Forearm Left  Result Date: 08/06/2022 CLINICAL DATA:  Fall, forearm pain. EXAM: LEFT FOREARM - 2 VIEW COMPARISON:  None Available. FINDINGS: Slight deformity at the radial head, of uncertain age but likely chronic. Remainder of the radius appears intact and normally aligned. Ulna appears intact and normally aligned. Visualized portion of the distal humerus appears intact and normally aligned. Visualized carpal bones appear intact and normally aligned. Soft tissues about the forearm are unremarkable. IMPRESSION: 1. Slight deformity at the radial head, of uncertain age but likely chronic. Recommend dedicated plain film of the LEFT elbow for further characterization if any localizable pain at the LEFT elbow. 2. Otherwise unremarkable plain film of the LEFT forearm. Electronically Signed   By: Franki Cabot M.D.   On: 08/06/2022 19:42    Procedures Procedures    Medications Ordered in ED Medications - No data to display  ED Course/ Medical Decision Making/ A&P    Patient seen and examined. History obtained directly from patient and family member at bedside.  Labs/EKG: EKG ordered at triage.  Results as above.  Personally reviewed and interpreted.  Imaging: Ordered x-ray of the forearm and  wrist  Medications/Fluids: Offered Tylenol, patient declines.  Most recent vital signs reviewed and are as follows: BP (!) 155/93   Pulse 77   Temp 98.2 F (36.8 C) (Oral)   Resp 19   Ht '5\' 2"'$  (1.575 m)   Wt 39.6 kg   SpO2 94%   BMI 15.97 kg/m   Initial impression: Wrist pain, fall, no concern for significant head injury based on exam  8:09 PM Reassessment performed. Patient appears stable.  She has not developed a headache, nausea/vomiting, or confusion.  Given x-ray findings I palpated over the radial head, and patient does not have point tenderness here.  Do not suspect radial head fracture.  Imaging personally visualized and interpreted including:X-ray of the wrist and  forearm, agree no acute fractures.  Reviewed pertinent lab work and imaging with patient and family member at bedside. Questions answered.   Most current vital signs reviewed and are as follows: BP (!) 155/93   Pulse 77   Temp 98.2 F (36.8 C) (Oral)   Resp 19   Ht '5\' 2"'$  (1.575 m)   Wt 39.6 kg   SpO2 94%   BMI 15.97 kg/m   Plan: Discharge to home, will provide with resplint  Prescriptions written for: None  Other home care instructions discussed: RICE protocol, wrist splint  ED return instructions discussed: Worsening pain, new symptoms including symptoms of head injury. Patient was counseled on head injury precautions and symptoms that should indicate their return to the ED.  These include severe worsening headache, vision changes, confusion, loss of consciousness, trouble walking, nausea & vomiting, or weakness/tingling in extremities.    Follow-up instructions discussed: Patient encouraged to follow-up with their PCP in 7 days for recheck if not improved.                           Medical Decision Making Amount and/or Complexity of Data Reviewed Radiology: ordered.   Patient presents with left wrist and forearm pain after a mechanical fall.  She tripped and fell in her yard.  X-rays are  negative for fracture.  Left upper extremity is neurovascularly intact.  She has a minimal abrasion on her left upper lip, but no headache or neck pain.  Stable during ED stay and evaluation.  Very low concern for closed head or neck injury in this patient who is not anticoagulated.  The patient's vital signs, pertinent lab work and imaging were reviewed and interpreted as discussed in the ED course. Hospitalization was considered for further testing, treatments, or serial exams/observation. However as patient is well-appearing, has a stable exam, and reassuring studies today, I do not feel that they warrant admission at this time. This plan was discussed with the patient and family member who verbalizes agreement and comfort with this plan and seems reliable and able to return to the Emergency Department with worsening or changing symptoms.          Final Clinical Impression(s) / ED Diagnoses Final diagnoses:  Left wrist pain    Rx / DC Orders ED Discharge Orders     None         Suann Larry 08/06/22 2011    Cristie Hem, MD 08/07/22 1115

## 2022-08-06 NOTE — Discharge Instructions (Signed)
Please read and follow all provided instructions.  Your diagnoses today include:  1. Left wrist pain     Tests performed today include: An x-ray of the affected area - does NOT show any broken bones EKG: no abnormal heart rhythms seen Vital signs. See below for your results today.   Medications prescribed:  None  Take any prescribed medications only as directed.  Home care instructions:  Follow any educational materials contained in this packet Follow R.I.C.E. Protocol: R - rest your injury  I  - use ice on injury without applying directly to skin C - compress injury with bandage or splint E - elevate the injury as much as possible  Follow-up instructions: Please follow-up with your primary care provider if you continue to have significant pain in 1 week. In this case you may have a more severe injury that requires further care.   Return instructions:  Please return if your fingers are numb or tingling, appear gray or blue, or you have severe pain (also elevate the arm and loosen splint or wrap if you were given one) Please return to the Emergency Department if you experience worsening symptoms.  Please return if you have any other emergent concerns.  Additional Information:  Your vital signs today were: BP (!) 155/93   Pulse 77   Temp 98.2 F (36.8 C) (Oral)   Resp 19   Ht '5\' 2"'$  (1.575 m)   Wt 39.6 kg   SpO2 94%   BMI 15.97 kg/m  If your blood pressure (BP) was elevated above 135/85 this visit, please have this repeated by your doctor within one month. --------------

## 2022-08-09 ENCOUNTER — Ambulatory Visit (INDEPENDENT_AMBULATORY_CARE_PROVIDER_SITE_OTHER): Payer: Medicare Other

## 2022-08-09 VITALS — BP 120/72 | HR 58 | Temp 97.7°F | Wt 85.0 lb

## 2022-08-09 DIAGNOSIS — Z0000 Encounter for general adult medical examination without abnormal findings: Secondary | ICD-10-CM

## 2022-08-09 NOTE — Patient Instructions (Signed)
[name] , Thank you for taking time to come for your Medicare Wellness Visit. I appreciate your ongoing commitment to your health goals. Please review the following plan we discussed and let me know if I can assist you in the future.   Screening recommendations/referrals: Colonoscopy: no longer required  Mammogram: no longer required  Bone Density: no longer required  Recommended yearly ophthalmology/optometry visit for glaucoma screening and checkup Recommended yearly dental visit for hygiene and checkup  Vaccinations: Influenza vaccine: done 11/30/21 repeat every year  Pneumococcal vaccine: Up to date Tdap vaccine: due and discussed  Shingles vaccine:     Covid-19:declined   Advanced directives: Please bring a copy of your health care power of attorney and living will to the office at your convenience.  Conditions/risks identified: keep walking   Next appointment: Follow up in one year for your annual wellness visit    Preventive Care 65 Years and Older, Female Preventive care refers to lifestyle choices and visits with your health care provider that can promote health and wellness. What does preventive care include? A yearly physical exam. This is also called an annual well check. Dental exams once or twice a year. Routine eye exams. Ask your health care provider how often you should have your eyes checked. Personal lifestyle choices, including: Daily care of your teeth and gums. Regular physical activity. Eating a healthy diet. Avoiding tobacco and drug use. Limiting alcohol use. Practicing safe sex. Taking low-dose aspirin every day. Taking vitamin and mineral supplements as recommended by your health care provider. What happens during an annual well check? The services and screenings done by your health care provider during your annual well check will depend on your age, overall health, lifestyle risk factors, and family history of disease. Counseling  Your health  care provider may ask you questions about your: Alcohol use. Tobacco use. Drug use. Emotional well-being. Home and relationship well-being. Sexual activity. Eating habits. History of falls. Memory and ability to understand (cognition). Work and work Statistician. Reproductive health. Screening  You may have the following tests or measurements: Height, weight, and BMI. Blood pressure. Lipid and cholesterol levels. These may be checked every 5 years, or more frequently if you are over 38 years old. Skin check. Lung cancer screening. You may have this screening every year starting at age 10 if you have a 30-pack-year history of smoking and currently smoke or have quit within the past 15 years. Fecal occult blood test (FOBT) of the stool. You may have this test every year starting at age 51. Flexible sigmoidoscopy or colonoscopy. You may have a sigmoidoscopy every 5 years or a colonoscopy every 10 years starting at age 42. Hepatitis C blood test. Hepatitis B blood test. Sexually transmitted disease (STD) testing. Diabetes screening. This is done by checking your blood sugar (glucose) after you have not eaten for a while (fasting). You may have this done every 1-3 years. Bone density scan. This is done to screen for osteoporosis. You may have this done starting at age 57. Mammogram. This may be done every 1-2 years. Talk to your health care provider about how often you should have regular mammograms. Talk with your health care provider about your test results, treatment options, and if necessary, the need for more tests. Vaccines  Your health care provider may recommend certain vaccines, such as: Influenza vaccine. This is recommended every year. Tetanus, diphtheria, and acellular pertussis (Tdap, Td) vaccine. You may need a Td booster every 10 years. Zoster vaccine.  You may need this after age 71. Pneumococcal 13-valent conjugate (PCV13) vaccine. One dose is recommended after age  30. Pneumococcal polysaccharide (PPSV23) vaccine. One dose is recommended after age 44. Talk to your health care provider about which screenings and vaccines you need and how often you need them. This information is not intended to replace advice given to you by your health care provider. Make sure you discuss any questions you have with your health care provider. Document Released: 12/12/2015 Document Revised: 08/04/2016 Document Reviewed: 09/16/2015 Elsevier Interactive Patient Education  2017 [place] Prevention in the Home Falls can cause injuries. They can happen to people of all ages. There are many things you can do to make your home safe and to help prevent falls. What can I do on the outside of my home? Regularly fix the edges of walkways and driveways and fix any cracks. Remove anything that might make you trip as you walk through a door, such as a raised step or threshold. Trim any bushes or trees on the path to your home. Use bright outdoor lighting. Clear any walking paths of anything that might make someone trip, such as rocks or tools. Regularly check to see if handrails are loose or broken. Make sure that both sides of any steps have handrails. Any raised decks and porches should have guardrails on the edges. Have any leaves, snow, or ice cleared regularly. Use sand or salt on walking paths during winter. Clean up any spills in your garage right away. This includes oil or grease spills. What can I do in the bathroom? Use night lights. Install grab bars by the toilet and in the tub and shower. Do not use towel bars as grab bars. Use non-skid mats or decals in the tub or shower. If you need to sit down in the shower, use a plastic, non-slip stool. Keep the floor dry. Clean up any water that spills on the floor as soon as it happens. Remove soap buildup in the tub or shower regularly. Attach bath mats securely with double-sided non-slip rug tape. Do not have throw  rugs and other things on the floor that can make you trip. What can I do in the bedroom? Use night lights. Make sure that you have a light by your bed that is easy to reach. Do not use any sheets or blankets that are too big for your bed. They should not hang down onto the floor. Have a firm chair that has side arms. You can use this for support while you get dressed. Do not have throw rugs and other things on the floor that can make you trip. What can I do in the kitchen? Clean up any spills right away. Avoid walking on wet floors. Keep items that you use a lot in easy-to-reach places. If you need to reach something above you, use a strong step stool that has a grab bar. Keep electrical cords out of the way. Do not use floor polish or wax that makes floors slippery. If you must use wax, use non-skid floor wax. Do not have throw rugs and other things on the floor that can make you trip. What can I do with my stairs? Do not leave any items on the stairs. Make sure that there are handrails on both sides of the stairs and use them. Fix handrails that are broken or loose. Make sure that handrails are as long as the stairways. Check any carpeting to make sure that it is  firmly attached to the stairs. Fix any carpet that is loose or worn. Avoid having throw rugs at the top or bottom of the stairs. If you do have throw rugs, attach them to the floor with carpet tape. Make sure that you have a light switch at the top of the stairs and the bottom of the stairs. If you do not have them, ask someone to add them for you. What else can I do to help prevent falls? Wear shoes that: Do not have high heels. Have rubber bottoms. Are comfortable and fit you well. Are closed at the toe. Do not wear sandals. If you use a stepladder: Make sure that it is fully opened. Do not climb a closed stepladder. Make sure that both sides of the stepladder are locked into place. Ask someone to hold it for you, if  possible. Clearly mark and make sure that you can see: Any grab bars or handrails. First and last steps. Where the edge of each step is. Use tools that help you move around (mobility aids) if they are needed. These include: Canes. Walkers. Scooters. Crutches. Turn on the lights when you go into a dark area. Replace any light bulbs as soon as they burn out. Set up your furniture so you have a clear path. Avoid moving your furniture around. If any of your floors are uneven, fix them. If there are any pets around you, be aware of where they are. Review your medicines with your doctor. Some medicines can make you feel dizzy. This can increase your chance of falling. Ask your doctor what other things that you can do to help prevent falls. This information is not intended to replace advice given to you by your health care provider. Make sure you discuss any questions you have with your health care provider. Document Released: 09/11/2009 Document Revised: 04/22/2016 Document Reviewed: 12/20/2014 Elsevier Interactive Patient Education  2017 [employer].

## 2022-08-09 NOTE — Progress Notes (Signed)
Subjective:   KOLLEEN OCHSNER is a 88 y.o. female who presents for Medicare Annual (Subsequent) preventive examination.  Review of Systems     Cardiac Risk Factors include: advanced age (>41mn, >>62women);dyslipidemia     Objective:    Today's Vitals   08/09/22 1137  BP: 120/72  Pulse: (!) 58  Temp: 97.7 F (36.5 C)  SpO2: 93%  Weight: 85 lb (38.6 kg)   Body mass index is 15.55 kg/m.     04/16/2022    4:16 PM 07/13/2021   11:51 AM 02/05/2021    3:39 PM 07/07/2020   11:53 AM 12/01/2019    3:56 PM 08/31/2018    3:00 AM 08/30/2018   12:12 PM  Advanced Directives  Does Patient Have a Medical Advance Directive? No No No  Yes Yes Yes  Type of Advance Directive    Living will;[place]  Does patient want to make changes to medical advance directive?      No - Patient declined   [place] of [place]?    No - copy requested No - copy requested    Would patient like information on creating a medical advance directive? No - Patient declined Yes (MAU/Ambulatory/Procedural Areas - Information given) No - Patient declined        Current Medications (verified) Outpatient Encounter Medications as of 08/09/2022  Medication Sig   acetaminophen (TYLENOL) 500 MG tablet Take 500 mg by mouth every 6 (six) hours as needed for mild pain.    ADVAIR DISKUS 500-50 MCG/ACT AEPB INHALE 1 PUFF BY MOUTH 2 (TWO) TIMES DAILY. ADVAIR   albuterol (PROVENTIL HFA) 108 (90 Base) MCG/ACT inhaler Inhale 1-2 puffs into the lungs every 6 (six) hours as needed for wheezing or shortness of breath.   aspirin EC 81 MG tablet Take 1 tablet (81 mg total) by mouth daily. Swallow whole.   atorvastatin (LIPITOR) 40 MG tablet Take 1 tablet (40 mg total) by mouth daily.   levothyroxine (SYNTHROID) 100 MCG tablet TAKE 1 TABLET BY MOUTH EVERY DAY   Multiple Vitamins-Minerals (ICAPS PO)  Take 1 capsule by mouth 2 (two) times daily.   azelastine (ASTELIN) 0.1 % nasal spray Place 2 sprays into both nostrils 2 (two) times daily. (Patient not taking: Reported on 08/09/2022)   No facility-administered encounter medications on file as of 08/09/2022.    Allergies (verified) Levofloxacin, Macrobid [nitrofurantoin macrocrystal], Sulfamethoxazole, Tramadol, and Amoxicillin   History: Past Medical History:  Diagnosis Date   CAD (coronary artery disease)    CELLULITIS 06/18/2008   [place]    CHRONIC OBSTRUCTIVE PULMONARY DISEASE, ACUTE EXACERBATION 11/16/2007   COPD 05/18/2007   COVID-19    GERD 12/25/2007   HYPERLIPIDEMIA 05/18/2007   HYPOTHYROIDISM 05/18/2007   Macular degeneration    [place]   Urosepsis    Past Surgical History:  Procedure Laterality Date   CATARACT EXTRACTION     DILATION AND CURETTAGE OF UTERUS     KYPHOPLASTY N/A 05/02/2020   Procedure: Thoracic eleven KYPHOPLASTY;  Surgeon: CAshok Pall MD;  [place] [facility]  Service: Neurosurgery;  Laterality: N/A;  3C   TEE WITHOUT CARDIOVERSION N/A 09/01/2018   Procedure: TRANSESOPHAGEAL ECHOCARDIOGRAM (TEE);  Surgeon: CBuford Dresser MD;  [facility];  Service: Cardiovascular;  Laterality: N/A;   VARICOSE VEIN SURGERY     Family History  Problem Relation Age  of Onset   Ovarian cancer Mother    Diabetes Mellitus II Brother    Social History   Socioeconomic History   Marital status: Widowed    Spouse name: Not on file   Number of children: Not on file   Years of education: Not on file   Highest education level: Not on file  Occupational History   Occupation: Retired  Tobacco Use   Smoking status: Former    Years: 40.00    Types: Cigarettes    Quit date: 11/29/1985    Years since quitting: 36.7   Smokeless tobacco: Never  Vaping Use   Vaping Use: Never used  Substance and Sexual Activity   Alcohol use: No   Drug use: No   Sexual activity: Not  on file  Other Topics Concern   Not on file  Social History Narrative   Not on file   Social Determinants of Health   Financial Resource Strain: Low Risk  (08/09/2022)   Overall Financial Resource Strain (CARDIA)    Difficulty of Paying Living Expenses: Not hard at all  Food Insecurity: No Food Insecurity (08/09/2022)   Hunger Vital Sign    Worried About Running Out of Food in the Last Year: Never true    [place] in the Last Year: Never true  Transportation Needs: No Transportation Needs (08/09/2022)   PRAPARE - Hydrologist (Medical): No    Lack of Transportation (Non-Medical): No  Physical Activity: Insufficiently Active (08/09/2022)   Exercise Vital Sign    Days of Exercise per Week: 5 days    Minutes of Exercise per Session: 10 min  Stress: No Stress Concern Present (08/09/2022)   [place]    Feeling of Stress : Not at all  Social Connections: Moderately Isolated (08/09/2022)   Social Connection and Isolation Panel [NHANES]    Frequency of Communication with Friends and Family: More than three times a week    Frequency of Social Gatherings with Friends and Family: More than three times a week    Attends Religious Services: 1 to 4 times per year    Active Member of [employer] or Organizations: No    Attends Archivist Meetings: Never    Marital Status: Widowed    Tobacco Counseling Counseling given: Not Answered   Clinical Intake:  Pre-visit preparation completed: Yes  Pain : No/denies pain     Diabetes: No  How often do you need to have someone help you when you read instructions, pamphlets, or other written materials from your doctor or pharmacy?: 1 - Never  Diabetic?no  Interpreter Needed?: No  Information entered by :: Charlott Rakes, LPN   Activities of Daily Living    08/09/2022   11:49 AM  In your present state of health, do you have any  difficulty performing the following activities:  Hearing? 1  Comment hearing aids  Vision? 1  Comment macular difficulty  Difficulty concentrating or making decisions? 1  Walking or climbing stairs? 0  Comment use a rail  Dressing or bathing? 0  Doing errands, shopping? 0  Preparing Food and eating ? N  Using the Toilet? N  In the past six months, have you accidently leaked urine? N  Do you have problems with loss of bowel control? N  Managing your Medications? N  Managing your Finances? N  Housekeeping or managing your Housekeeping? N    Patient Care  Team: Vivi Barrack, MD as PCP - General (Family Medicine) Evans Lance, MD as PCP - Cardiology (Cardiology)  Indicate any recent Medical Services you may have received from other than [facility] providers in the past year (date may be approximate).     Assessment:   This is a routine wellness examination for [place].  Hearing/Vision screen Hearing Screening - Comments:: Pt wears hearing issues  Vision Screening - Comments:: Pt follows up with Dr Tye Savoy for annual eye exams   Dietary issues and exercise activities discussed: Current Exercise Habits: Home exercise routine, Type of exercise: walking, Time (Minutes): 10, Frequency (Times/Week): 5, Weekly Exercise (Minutes/Week): 50   Goals Addressed             This Visit's Progress    Patient Stated       Walk around house        Depression Screen    08/09/2022   11:44 AM 04/27/2022   11:26 AM 07/20/2021    1:03 PM 07/13/2021   11:49 AM 07/07/2020   11:56 AM 07/06/2019    2:22 PM 02/01/2017    1:36 PM  PHQ 2/9 Scores  PHQ - 2 Score 0 0 0 0 0 0 0    Fall Risk    08/09/2022   11:49 AM 04/27/2022   11:26 AM 07/13/2021   11:52 AM 07/07/2020   11:55 AM 10/03/2018    2:30 PM  Page in the past year? 1 0 0 0 0  Number falls in past yr: 1 0 0 0   Injury with Fall? 1 0 0 0   Comment left arm sprain      Risk for fall due to : History of fall(s);Impaired  mobility;Impaired balance/gait;Impaired vision No Fall Risks  Impaired balance/gait   Follow up Falls prevention discussed  Falls prevention discussed Falls prevention discussed     FALL RISK PREVENTION PERTAINING TO THE HOME:  Any stairs in or around the home? Yes  If so, are there any without handrails? No  Home free of loose throw rugs in walkways, pet beds, electrical cords, etc? Yes  Adequate lighting in your home to reduce risk of falls? Yes   ASSISTIVE DEVICES UTILIZED TO PREVENT FALLS:  Life alert? Yes  Use of a cane, walker or w/c? No  Grab bars in the bathroom? No  Shower chair or bench in shower? No  Elevated toilet seat or a handicapped toilet? No   TIMED UP AND GO:  Was the test performed? Yes .  Length of time to ambulate 10 feet: 20 sec.   Gait slow and steady without use of assistive device  Cognitive Function:        08/09/2022   11:56 AM 07/13/2021   11:57 AM 07/07/2020   12:00 PM  6CIT Screen  What Year? 0 points 0 points 0 points  What month? 0 points 0 points 0 points  What time? 0 points 0 points   Count back from 20 0 points  0 points  Months in reverse 0 points 0 points 2 points  Repeat phrase 4 points 4 points 8 points  Total Score 4 points      Immunizations Immunization History  Administered Date(s) Administered   Fluad Quad(high Dose 65+) 11/19/2019, 10/10/2020, 11/30/2021   Influenza Split 08/23/2011, 08/22/2012   Influenza Whole 09/18/2007, 09/10/2008, 09/10/2009, 08/19/2010   Influenza, High Dose Seasonal PF 09/03/2013, 12/23/2014, 09/26/2015, 08/31/2016, 09/07/2017, 09/07/2018   Pneumococcal Conjugate-13 12/23/2014  Pneumococcal Polysaccharide-23 09/17/2013   Td 06/11/2008    TDAP status: Due, Education has been provided regarding the importance of this vaccine. Advised may receive this vaccine at local pharmacy or Health Dept. Aware to provide a copy of the vaccination record if obtained from local pharmacy or Health Dept.  Verbalized acceptance and understanding.  Flu Vaccine status: Up to date  Pneumococcal vaccine status: Up to date  Covid-19 vaccine status: Declined, Education has been provided regarding the importance of this vaccine but patient still declined. Advised may receive this vaccine at local pharmacy or Health Dept.or vaccine clinic. Aware to provide a copy of the vaccination record if obtained from local pharmacy or Health Dept. Verbalized acceptance and understanding.  Qualifies for Shingles Vaccine? Yes   Zostavax completed No   Shingrix Completed?: No.    Education has been provided regarding the importance of this vaccine. Patient has been advised to call insurance company to determine out of pocket expense if they have not yet received this vaccine. Advised may also receive vaccine at local pharmacy or Health Dept. Verbalized acceptance and understanding.  Screening Tests Health Maintenance  Topic Date Due   COVID-19 Vaccine (1) Never done   Zoster Vaccines- Shingrix (1 of 2) Never done   DEXA SCAN  Never done   TETANUS/TDAP  06/11/2018   INFLUENZA VACCINE  06/29/2022   Pneumonia Vaccine 20+ Years old  Completed   HPV VACCINES  Aged Out    Health Maintenance  Health Maintenance Due  Topic Date Due   COVID-19 Vaccine (1) Never done   Zoster Vaccines- Shingrix (1 of 2) Never done   DEXA SCAN  Never done   TETANUS/TDAP  06/11/2018   INFLUENZA VACCINE  06/29/2022    Colorectal cancer screening: No longer required.   Mammogram status: No longer required due to age .      Additional Screening:   Vision Screening: Recommended annual ophthalmology exams for early detection of glaucoma and other disorders of the eye. Is the patient up to date with their annual eye exam?  Yes  Who is the provider or what is the name of the office in which the patient attends annual eye exams? Dr Lorrene Reid  If pt is not established with a provider, would they like to be referred to a  provider to establish care? No .   Dental Screening: Recommended annual dental exams for proper oral hygiene  Community Resource Referral / Chronic Care Management: CRR required this visit?  No   CCM required this visit?  No      Plan:     I have personally reviewed and noted the following in the patient's chart:   Medical and social history Use of alcohol, tobacco or illicit drugs  Current medications and supplements including opioid prescriptions. Patient is not currently taking opioid prescriptions. Functional ability and status Nutritional status Physical activity Advanced directives List of other physicians Hospitalizations, surgeries, and ER visits in previous 12 months Vitals Screenings to include cognitive, depression, and falls Referrals and appointments  In addition, I have reviewed and discussed with patient certain preventive protocols, quality metrics, and best practice recommendations. A written personalized care plan for preventive services as well as general preventive health recommendations were provided to patient.     Willette Brace, LPN   [id number]   Nurse Notes: none

## 2022-08-23 ENCOUNTER — Encounter: Payer: Self-pay | Admitting: *Deleted

## 2022-09-03 ENCOUNTER — Ambulatory Visit (INDEPENDENT_AMBULATORY_CARE_PROVIDER_SITE_OTHER): Payer: Medicare Other | Admitting: Sports Medicine

## 2022-09-03 ENCOUNTER — Ambulatory Visit (INDEPENDENT_AMBULATORY_CARE_PROVIDER_SITE_OTHER): Payer: Medicare Other

## 2022-09-03 VITALS — HR 65 | Ht 62.0 in | Wt 85.0 lb

## 2022-09-03 DIAGNOSIS — W19XXXA Unspecified fall, initial encounter: Secondary | ICD-10-CM

## 2022-09-03 DIAGNOSIS — S6992XS Unspecified injury of left wrist, hand and finger(s), sequela: Secondary | ICD-10-CM

## 2022-09-03 DIAGNOSIS — S6992XA Unspecified injury of left wrist, hand and finger(s), initial encounter: Secondary | ICD-10-CM

## 2022-09-03 DIAGNOSIS — M25532 Pain in left wrist: Secondary | ICD-10-CM | POA: Diagnosis not present

## 2022-09-03 DIAGNOSIS — S52592A Other fractures of lower end of left radius, initial encounter for closed fracture: Secondary | ICD-10-CM

## 2022-09-03 NOTE — Progress Notes (Signed)
Benito Mccreedy D.[place] Phone: [phone]   Assessment and Plan:     1. Injury of left wrist, initial encounter 2. Fall, initial encounter 3. Other closed fracture of distal end of left radius, initial encounter  -Acute, complicated, initial sports medicine visit - Crush injury of distal left radius from fall on 08/06/2022 - X-ray obtained in clinic.  My interpretation: When compared with imaging performed in ER day of fall on 08/06/2022, new x-rays show compression of distal radius with hyperdense cortical irregularities consistent with a crush injury and consistent with patient's superficial swelling - Recommend using wrist brace at all times for the next 3 weeks or until reevaluated.  May come out of the brace to clean once a day, but otherwise recommend wearing day and night - Use Tylenol as needed for day-to-day pain relief  Pertinent previous records reviewed include ER note 08/06/2022, family medicine note 08/09/2022, left wrist x-ray 08/06/2022   Follow Up: 3 weeks for reevaluation.  Would repeat wrist x-ray to ensure gradual healing at that time.  Could consider weaning of wrist brace and starting HEP to prevent stiffness   Subjective:   I, Moenique Parris, am serving as a Education administrator for Doctor Glennon Mac  Chief Complaint: left wrist pain   HPI:   09/03/22 Patient is a 88 year old female complaining of left wrist pain. Patient states that she tripped over something in her yard that caused her to fall.  She sustained a minimal abrasion above her upper lip.  She did not lose consciousness.  She denies headache, confusion, vomiting, neck pain, weakness in the arms of the legs.  Pain is worse with movement.  No treatments prior to arrival.  She is not on anticoagulation.  No shoulder or elbow pain. Three weeks , swollen and has a know compared bilaterally, gets really cold and her fingertips will turn blue , no  meds for the pain , she wears the brace it helps relief the pressure   Relevant Historical Information: History of CVA, COPD  Additional pertinent review of systems negative.   Current Outpatient Medications:    acetaminophen (TYLENOL) 500 MG tablet, Take 500 mg by mouth every 6 (six) hours as needed for mild pain. , Disp: , Rfl:    ADVAIR DISKUS 500-50 MCG/ACT AEPB, INHALE 1 PUFF BY MOUTH 2 (TWO) TIMES DAILY. ADVAIR, Disp: 180 each, Rfl: 1   albuterol (PROVENTIL HFA) 108 (90 Base) MCG/ACT inhaler, Inhale 1-2 puffs into the lungs every 6 (six) hours as needed for wheezing or shortness of breath., Disp: 3 each, Rfl: 3   aspirin EC 81 MG tablet, Take 1 tablet (81 mg total) by mouth daily. Swallow whole., Disp: 30 tablet, Rfl: 11   atorvastatin (LIPITOR) 40 MG tablet, Take 1 tablet (40 mg total) by mouth daily., Disp: 90 tablet, Rfl: 3   azelastine (ASTELIN) 0.1 % nasal spray, Place 2 sprays into both nostrils 2 (two) times daily., Disp: 30 mL, Rfl: 12   levothyroxine (SYNTHROID) 100 MCG tablet, TAKE 1 TABLET BY MOUTH EVERY DAY, Disp: 90 tablet, Rfl: 1   Multiple Vitamins-Minerals (ICAPS PO), Take 1 capsule by mouth 2 (two) times daily., Disp: , Rfl:    Objective:     Vitals:   09/03/22 1128  Pulse: 65  SpO2: 97%  Weight: 85 lb (38.6 kg)  Height: '5\' 2"'$  (1.575 m)      Body mass index is 15.55 kg/m.  Physical Exam:    General: Appears well, nad, nontoxic and pleasant Neuro:sensation intact, strength is 5/5 with df/pf/inv/ev, muscle tone wnl Skin:no susupicious lesions or rashes  Left wrist:  No deformity  appreciated.  Moderate swelling over wrist primarily at distal radial ulnar joint ROM  Ext 70, flexion 60, radial/ulnar deviation 20 TTP distal radius nttp over the snauff box, dorsal carpals, volar carpals,  , ulnar styloid, 1st mcp, tfcc     Electronically signed by:  Benito Mccreedy D.[facility] 11:51 AM 09/03/22

## 2022-09-03 NOTE — Patient Instructions (Addendum)
Good to see you  Wear brace at all time for the next 4 weeks Tylenol as needed daily pain relief 3 week follow up

## 2022-09-23 NOTE — Progress Notes (Signed)
Benito Mccreedy D.[place] Phone: [phone]   Assessment and Plan:     1. Injury of left wrist, subsequent encounter 2. Fall, subsequent encounter 3. Other closed fracture of distal end of left radius with routine healing, subsequent encounter  -Subacute, complicated, subsequent visit - Crush injury of left distal radius from fall on 08/06/2022 - X-ray obtained in clinic.  My interpretation: Continued evidence of healing subacute distal radial metaphyseal impaction fracture - As patient still has some bony TTP, recommend 1 additional week of full-time wrist brace use.  After 1 week may gradually discontinue brace as tolerated - Start HEP for range of motion of wrist  Pertinent previous records reviewed include x-ray 09/03/2022   Follow Up: 2-3 weeks for reevaluation.  Should not need repeat x-ray at that time unless patient has had new fall or trauma.  Hope to see no pain with palpation and could potentially discontinue brace use.  Could also image C-spine if patient has had no improvement in neck discomfort   Subjective:   I, Moenique Parris, am serving as a Education administrator for Doctor Glennon Mac   Chief Complaint: left wrist pain    HPI:    09/03/22 Patient is a 88 year old female complaining of left wrist pain. Patient states that she tripped over something in her yard that caused her to fall.  She sustained a minimal abrasion above her upper lip.  She did not lose consciousness.  She denies headache, confusion, vomiting, neck pain, weakness in the arms of the legs.  Pain is worse with movement.  No treatments prior to arrival.  She is not on anticoagulation.  No shoulder or elbow pain. Three weeks , swollen and has a know compared bilaterally, gets really cold and her fingertips will turn blue , no meds for the pain , she wears the brace it helps relief the pressure   09/24/2022  Patient states feels like her  hand is good she has no problems with it    Relevant Historical Information: History of CVA, COPD  Additional pertinent review of systems negative.   Current Outpatient Medications:    acetaminophen (TYLENOL) 500 MG tablet, Take 500 mg by mouth every 6 (six) hours as needed for mild pain. , Disp: , Rfl:    ADVAIR DISKUS 500-50 MCG/ACT AEPB, INHALE 1 PUFF BY MOUTH 2 (TWO) TIMES DAILY. ADVAIR, Disp: 180 each, Rfl: 1   albuterol (PROVENTIL HFA) 108 (90 Base) MCG/ACT inhaler, Inhale 1-2 puffs into the lungs every 6 (six) hours as needed for wheezing or shortness of breath., Disp: 3 each, Rfl: 3   aspirin EC 81 MG tablet, Take 1 tablet (81 mg total) by mouth daily. Swallow whole., Disp: 30 tablet, Rfl: 11   atorvastatin (LIPITOR) 40 MG tablet, Take 1 tablet (40 mg total) by mouth daily., Disp: 90 tablet, Rfl: 3   azelastine (ASTELIN) 0.1 % nasal spray, Place 2 sprays into both nostrils 2 (two) times daily., Disp: 30 mL, Rfl: 12   levothyroxine (SYNTHROID) 100 MCG tablet, TAKE 1 TABLET BY MOUTH EVERY DAY, Disp: 90 tablet, Rfl: 1   Multiple Vitamins-Minerals (ICAPS PO), Take 1 capsule by mouth 2 (two) times daily., Disp: , Rfl:    Objective:     Vitals:   09/24/22 1113  BP: (!) 120/8  Pulse: (!) 58  SpO2: 96%  Weight: 85 lb (38.6 kg)  Height: '5\' 2"'$  (1.575 m)  Body mass index is 15.55 kg/m.    Physical Exam:     General: Appears well, nad, nontoxic and pleasant Neuro:sensation intact, strength is 5/5 with df/pf/inv/ev, muscle tone wnl Skin:no susupicious lesions or rashes   Left wrist:  No deformity  appreciated.  Moderate swelling over wrist primarily at distal radial ulnar joint ROM  Ext 80, flexion 60 and painful, radial/ulnar deviation 25 TTP distal radius nttp over the snauff box, dorsal carpals, volar carpals,  , ulnar styloid, 1st mcp, tfcc   Electronically signed by:  Benito Mccreedy D.[facility] 11:33 AM 09/24/22

## 2022-09-24 ENCOUNTER — Ambulatory Visit (INDEPENDENT_AMBULATORY_CARE_PROVIDER_SITE_OTHER): Payer: Medicare Other

## 2022-09-24 ENCOUNTER — Ambulatory Visit (INDEPENDENT_AMBULATORY_CARE_PROVIDER_SITE_OTHER): Payer: Medicare Other | Admitting: Sports Medicine

## 2022-09-24 VITALS — BP 120/8 | HR 58 | Ht 62.0 in | Wt 85.0 lb

## 2022-09-24 DIAGNOSIS — S52592D Other fractures of lower end of left radius, subsequent encounter for closed fracture with routine healing: Secondary | ICD-10-CM | POA: Diagnosis not present

## 2022-09-24 DIAGNOSIS — S6992XA Unspecified injury of left wrist, hand and finger(s), initial encounter: Secondary | ICD-10-CM

## 2022-09-24 DIAGNOSIS — W19XXXD Unspecified fall, subsequent encounter: Secondary | ICD-10-CM

## 2022-09-24 DIAGNOSIS — S6992XD Unspecified injury of left wrist, hand and finger(s), subsequent encounter: Secondary | ICD-10-CM

## 2022-09-24 DIAGNOSIS — M19032 Primary osteoarthritis, left wrist: Secondary | ICD-10-CM | POA: Diagnosis not present

## 2022-09-24 NOTE — Patient Instructions (Addendum)
Good to see you  Wrist and neck HEP  Continue brace at all times for 1 week  After 1 week may come out of brace as tolerated  Tylenol as needed  2 week follow up

## 2022-09-26 ENCOUNTER — Other Ambulatory Visit: Payer: Self-pay

## 2022-09-26 ENCOUNTER — Emergency Department (HOSPITAL_BASED_OUTPATIENT_CLINIC_OR_DEPARTMENT_OTHER): Payer: Medicare Other

## 2022-09-26 ENCOUNTER — Emergency Department (HOSPITAL_BASED_OUTPATIENT_CLINIC_OR_DEPARTMENT_OTHER)
Admission: EM | Admit: 2022-09-26 | Discharge: 2022-09-26 | Disposition: A | Discharge status: Home / Self Care | Payer: Medicare Other | Attending: Emergency Medicine | Admitting: Emergency Medicine

## 2022-09-26 ENCOUNTER — Encounter (HOSPITAL_BASED_OUTPATIENT_CLINIC_OR_DEPARTMENT_OTHER): Payer: Self-pay

## 2022-09-26 DIAGNOSIS — S93401A Sprain of unspecified ligament of right ankle, initial encounter: Secondary | ICD-10-CM

## 2022-09-26 DIAGNOSIS — Z7982 Long term (current) use of aspirin: Secondary | ICD-10-CM | POA: Insufficient documentation

## 2022-09-26 DIAGNOSIS — X501XXA Overexertion from prolonged static or awkward postures, initial encounter: Secondary | ICD-10-CM | POA: Insufficient documentation

## 2022-09-26 DIAGNOSIS — Y9339 Activity, other involving climbing, rappelling and jumping off: Secondary | ICD-10-CM | POA: Insufficient documentation

## 2022-09-26 DIAGNOSIS — S93601A Unspecified sprain of right foot, initial encounter: Secondary | ICD-10-CM | POA: Diagnosis not present

## 2022-09-26 DIAGNOSIS — Y92009 Unspecified place in unspecified non-institutional (private) residence as the place of occurrence of the external cause: Secondary | ICD-10-CM | POA: Diagnosis not present

## 2022-09-26 DIAGNOSIS — S79911A Unspecified injury of right hip, initial encounter: Secondary | ICD-10-CM | POA: Diagnosis not present

## 2022-09-26 DIAGNOSIS — M79671 Pain in right foot: Secondary | ICD-10-CM | POA: Diagnosis present

## 2022-09-26 DIAGNOSIS — S99921A Unspecified injury of right foot, initial encounter: Secondary | ICD-10-CM | POA: Diagnosis not present

## 2022-09-26 DIAGNOSIS — S8991XA Unspecified injury of right lower leg, initial encounter: Secondary | ICD-10-CM | POA: Diagnosis not present

## 2022-09-26 NOTE — ED Provider Notes (Signed)
Holly [name] Provider Note   CSN: [id number] Arrival date & time: 09/26/22  1546     History  Chief Complaint  Patient presents with   Holly [name] is a 88 y.o. female.  Patient here with pain in her right foot, right knee, right hip after fall.  She states that a dog that they are watching at home jumped up on top of her and knocked her over.  She felt like she twisted her foot and ankle.  She been ambulatory afterwards but with some discomfort.  She is not on blood thinners.  She did not hit her head or lose consciousness.  She has no headache or neck pain.  Walking makes it little bit more painful.  Denies any weakness or numbness or vision changes.  No nausea or vomiting or abdominal pain.  No significant comorbidities.  The history is provided by the patient.       Home Medications Prior to Admission medications   Medication Sig Start Date End Date Taking? Authorizing Provider  acetaminophen (TYLENOL) 500 MG tablet Take 500 mg by mouth every 6 (six) hours as needed for mild pain.     [provider]  ADVAIR DISKUS 500-50 MCG/ACT AEPB INHALE 1 PUFF BY MOUTH 2 (TWO) TIMES DAILY. ADVAIR 04/19/22   Vivi Barrack, MD  albuterol (PROVENTIL HFA) 108 (90 Base) MCG/ACT inhaler Inhale 1-2 puffs into the lungs every 6 (six) hours as needed for wheezing or shortness of breath. 10/12/21   Vivi Barrack, MD  aspirin EC 81 MG tablet Take 1 tablet (81 mg total) by mouth daily. Swallow whole. 02/11/21   Frann Rider, NP  atorvastatin (LIPITOR) 40 MG tablet Take 1 tablet (40 mg total) by mouth daily. 10/12/21   Vivi Barrack, MD  azelastine (ASTELIN) 0.1 % nasal spray Place 2 sprays into both nostrils 2 (two) times daily. 10/12/21   Vivi Barrack, MD  levothyroxine (SYNTHROID) 100 MCG tablet TAKE 1 TABLET BY MOUTH EVERY DAY 03/31/22   Vivi Barrack, MD  Multiple Vitamins-Minerals (ICAPS PO) Take 1 capsule by mouth 2 (two) times daily.     [provider]      Allergies    Levofloxacin, Macrobid [nitrofurantoin macrocrystal], Sulfamethoxazole, Tramadol, and Amoxicillin    Review of Systems   Review of Systems  Physical Exam Updated Vital Signs BP (!) 109/90 (BP Location: Right Arm)   Pulse 77   Temp 98.1 F (36.7 C) (Oral)   Resp 18   Ht '5\' 2"'$  (1.575 m)   Wt 38.6 kg   SpO2 96%   BMI 15.55 kg/m  Physical Exam Vitals and nursing note reviewed.  Constitutional:      General: She is not in acute distress.    Appearance: She is well-developed. She is not ill-appearing.  HENT:     Head: Normocephalic and atraumatic.     Nose: Nose normal.     Mouth/Throat:     Mouth: Mucous membranes are moist.  Eyes:     Extraocular Movements: Extraocular movements intact.     Conjunctiva/sclera: Conjunctivae normal.     Pupils: Pupils are equal, round, and reactive to light.  Cardiovascular:     Rate and Rhythm: Normal rate and regular rhythm.     Pulses: Normal pulses.     Heart sounds: No murmur heard. Pulmonary:     Effort: Pulmonary effort is normal. No respiratory distress.     Breath  sounds: Normal breath sounds.  Abdominal:     Palpations: Abdomen is soft.     Tenderness: There is no abdominal tenderness.  Musculoskeletal:        General: Tenderness present. No swelling or deformity.     Cervical back: Normal range of motion and neck supple.     Comments: Tenderness in the calcaneal region of the right foot/ankle, right knee, right hip, no midline spinal tenderness  Skin:    General: Skin is warm and dry.     Capillary Refill: Capillary refill takes less than 2 seconds.  Neurological:     General: No focal deficit present.     Mental Status: She is alert and oriented to person, place, and time.     Cranial Nerves: No cranial nerve deficit.     Sensory: No sensory deficit.     Motor: No weakness.     Coordination: Coordination normal.     Gait: Gait normal.     Comments: 5+ out of 5 strength  throughout, normal sensation, no drift, normal finger-nose-finger, normal speech  Psychiatric:        Mood and Affect: Mood normal.     ED Results / Procedures / Treatments   Labs (all labs ordered are listed, but only abnormal results are displayed) Labs Reviewed - No data to display  EKG None  Radiology DG Knee Complete 4 Views Right  Result Date: 09/26/2022 CLINICAL DATA:  Trauma, fall EXAM: RIGHT KNEE - COMPLETE 4+ VIEW COMPARISON:  None Available. FINDINGS: No fracture or dislocation is seen. There is no significant effusion. Osteopenia is seen in bony structures. IMPRESSION: No fracture or dislocation is seen in right knee. Electronically Signed   By: Elmer Picker M.D.   On: 09/26/2022 17:03   DG Foot Complete Right  Result Date: 09/26/2022 CLINICAL DATA:  Trauma, fall EXAM: RIGHT FOOT COMPLETE - 3+ VIEW COMPARISON:  None Available. FINDINGS: No displaced fracture or dislocation is seen. Osteopenia is seen in bony structures. Bony spurs are noted in the dorsal aspect of anterior talus. Small plantar spur is seen in calcaneus. IMPRESSION: No fracture or dislocation is seen in right foot. Electronically Signed   By: Elmer Picker M.D.   On: 09/26/2022 17:01   DG Hip Unilat  With Pelvis 2-3 Views Right  Result Date: 09/26/2022 CLINICAL DATA:  Trauma, fall EXAM: DG HIP (WITH OR WITHOUT PELVIS) 2-3V RIGHT COMPARISON:  None Available. FINDINGS: No fracture or dislocation is seen. Surgical clips are noted medial to the proximal right femur. Osteopenia is seen in bony structures. IMPRESSION: No recent fracture or dislocation is seen in right hip. Electronically Signed   By: Elmer Picker M.D.   On: 09/26/2022 16:56    Procedures Procedures    Medications Ordered in ED Medications - No data to display  ED Course/ Medical Decision Making/ A&P                           Medical Decision Making Amount and/or Complexity of Data Reviewed Radiology:  ordered.   Holly [name] is here with hip pain, knee pain, foot ankle pain after knocked over by a dog.  She is on blood thinners.  Normal vitals.  No fever.  Ambulatory afterwards.  No midline spinal pain.  No loss of consciousness or headache or neck pain.  No upper extremity pain.  She is tender in the right hip and right knee but no obvious swelling  or deformity.  She is tender at the lateral malleolus of the ankle and foot on the right.  X-rays were obtained in triage of the right hip, right knee and right foot.  Per radiology report there are no acute fracture or malalignment.  Per my review and interpretation I agree as well.  I am able to see the lower portion of the ankle pretty well and I have no concern for ankle fracture either.  Overall suspect patient with ankle sprain.  This is where she is most tender on exam.  We will put her in a walking boot and have her follow-up with her orthopedic doctor.  Recommend ice, Tylenol and rest.  She has a cane at home that she feels comfortable using.  No need for any further imaging at this time.  Discharged in good condition.  This chart was dictated using voice recognition software.  Despite best efforts to proofread,  errors can occur which can change the documentation meaning.         Final Clinical Impression(s) / ED Diagnoses Final diagnoses:  Sprain of right ankle, unspecified ligament, initial encounter  Foot sprain, right, initial encounter    Rx / DC Orders ED Discharge Orders     None         Lennice Sites, DO 09/26/22 1723

## 2022-09-26 NOTE — Discharge Instructions (Signed)
Use walking boot for comfort.  Recommend using your cane as well.  Recommend using 650 mg of Tylenol every 6 hours as needed for pain.  Recommend ice.  Follow-up with orthopedic.

## 2022-09-26 NOTE — ED Triage Notes (Signed)
Patient states a dog jumped up behind her and knocked her down x 2 hrs ago. No LOC. No blood thinners. Patient c/o right foot pain, knee, hip and lower back pain

## 2022-09-27 ENCOUNTER — Encounter: Payer: Self-pay | Admitting: Sports Medicine

## 2022-10-07 NOTE — Progress Notes (Signed)
Holly [name] D.[place] Phone: [phone]   Assessment and Plan:     1. Acute right ankle pain -Acute, uncomplicated, subsequent visit - Patient has had new right ankle pain after fall on 09/26/2022 when patient was knocked over by a dog.  Patient was seen in ER and had unremarkable x-ray imaging of knee, foot, hip, however patient had mild tenderness palpation of lateral malleolus, so ankle x-rays obtained today.  My interpretation: No acute fracture or dislocation.  Anterior tibial spurring seen consistent with foot x-ray from ER - Recommend continued boot use for 1 week.  After 1 week patient may walk without the boot while at home if pain-free, otherwise recommend full boot use. - Tylenol as needed for pain relief - May continue to use ice and compression as tolerated - DG Ankle Complete Right; Future    Pertinent previous records reviewed include ER note 09/26/2022, x-ray knee 09/26/2022, x-ray foot 09/26/2022, x-ray hip 09/26/2022  Patient's daughter present throughout examination and helps provide HPI   Follow Up: 2 weeks for reevaluation.  If pain is improving, would discontinue boot use   Subjective:   I, Holly [name], am serving as a Education administrator for Doctor Holly [name]   Chief Complaint: left wrist pain    HPI:    09/03/22 Patient is a 88 year old female complaining of left wrist pain. Patient states that she tripped over something in her yard that caused her to fall.  She sustained a minimal abrasion above her upper lip.  She did not lose consciousness.  She denies headache, confusion, vomiting, neck pain, weakness in the arms of the legs.  Pain is worse with movement.  No treatments prior to arrival.  She is not on anticoagulation.  No shoulder or elbow pain. Three weeks , swollen and has a know compared bilaterally, gets really cold and her fingertips will turn blue , no meds for the pain ,  she wears the brace it helps relief the pressure    09/24/2022  Patient states feels like her hand is good she has no problems with it   10/08/2022 Patient states she woke up this morning soaking wet , thinks she had a fever and took a tylenol, ankle is hot , dog jumped up and pushed her to the ground an , is in a walking boot , when she has pressure on it the pain is decreased   Slight wrist pain when she points down     Relevant Historical Information: History of CVA, COPD  Additional pertinent review of systems negative.   Current Outpatient Medications:    acetaminophen (TYLENOL) 500 MG tablet, Take 500 mg by mouth every 6 (six) hours as needed for mild pain. , Disp: , Rfl:    ADVAIR DISKUS 500-50 MCG/ACT AEPB, INHALE 1 PUFF BY MOUTH 2 (TWO) TIMES DAILY. ADVAIR, Disp: 180 each, Rfl: 1   albuterol (PROVENTIL HFA) 108 (90 Base) MCG/ACT inhaler, Inhale 1-2 puffs into the lungs every 6 (six) hours as needed for wheezing or shortness of breath., Disp: 3 each, Rfl: 3   aspirin EC 81 MG tablet, Take 1 tablet (81 mg total) by mouth daily. Swallow whole., Disp: 30 tablet, Rfl: 11   atorvastatin (LIPITOR) 40 MG tablet, Take 1 tablet (40 mg total) by mouth daily., Disp: 90 tablet, Rfl: 3   azelastine (ASTELIN) 0.1 % nasal spray, Place 2 sprays into both nostrils 2 (two) times daily.,  Disp: 30 mL, Rfl: 12   levothyroxine (SYNTHROID) 100 MCG tablet, TAKE 1 TABLET BY MOUTH EVERY DAY, Disp: 90 tablet, Rfl: 1   Multiple Vitamins-Minerals (ICAPS PO), Take 1 capsule by mouth 2 (two) times daily., Disp: , Rfl:    Objective:     Vitals:   10/08/22 1038  Pulse: (!) 58  SpO2: 99%  Weight: 85 lb (38.6 kg)  Height: '5\' 2"'$  (1.575 m)      Body mass index is 15.55 kg/m.    Physical Exam:    Gen: Appears well, nad, nontoxic and pleasant Psych: Alert and oriented, appropriate mood and affect Neuro: sensation intact, strength is 5/5 with df/pf/inv/ev, muscle tone wnl Skin: no susupicious lesions or  rashes.  Varicose veins throughout lower extremities bilaterally.  Darkening skin discoloration over anterior shin  Right foot/ankle: no deformity, no swelling or effusion TTP mildly lateral malleolus, ATFL, CFL NTTP over fibular head,   medial mal, achilles, navicular, base of 5th,  deltoid, calcaneous or midfoot ROM DF 30, PF 45, inv/ev intact Negative ant drawer, talar tilt, rotation test, squeeze test. Neg thompson   pain with resisted inversion or eversion over lateral ankle   Electronically signed by:  Holly [name] D.[facility] 11:15 AM 10/08/22

## 2022-10-08 ENCOUNTER — Ambulatory Visit (INDEPENDENT_AMBULATORY_CARE_PROVIDER_SITE_OTHER): Payer: Medicare Other

## 2022-10-08 ENCOUNTER — Ambulatory Visit (INDEPENDENT_AMBULATORY_CARE_PROVIDER_SITE_OTHER): Payer: Medicare Other | Admitting: Sports Medicine

## 2022-10-08 VITALS — HR 58 | Ht 62.0 in | Wt 85.0 lb

## 2022-10-08 DIAGNOSIS — M25571 Pain in right ankle and joints of right foot: Secondary | ICD-10-CM

## 2022-10-08 DIAGNOSIS — R600 Localized edema: Secondary | ICD-10-CM | POA: Diagnosis not present

## 2022-10-08 NOTE — Patient Instructions (Addendum)
Good to see you Xrays on the way out  Recommend using boot any time you are weightbearing for the next 1 week  After 1 week try walking with out boot at home and if painful continue boot use  Tylenol as needed for pain  2 week follow up

## 2022-10-14 ENCOUNTER — Encounter: Payer: Self-pay | Admitting: Family Medicine

## 2022-10-14 ENCOUNTER — Other Ambulatory Visit: Payer: Self-pay

## 2022-10-14 MED ORDER — LEVOTHYROXINE SODIUM 100 MCG PO TABS: 100.0000 ug | ORAL_TABLET | Freq: Every day | ORAL | 1 refills | Status: DC

## 2022-10-14 MED ORDER — ATORVASTATIN CALCIUM 40 MG PO TABS: 40.0000 mg | ORAL_TABLET | Freq: Every day | ORAL | 3 refills | Status: DC

## 2022-10-15 ENCOUNTER — Other Ambulatory Visit: Payer: Self-pay | Admitting: Family Medicine

## 2022-10-19 ENCOUNTER — Other Ambulatory Visit: Payer: Self-pay | Admitting: Family Medicine

## 2022-10-19 NOTE — Progress Notes (Unsigned)
Holly [name] D.[place] Phone: [phone]   Assessment and Plan:    1. Acute right ankle pain -Acute, uncomplicated, subsequent visit -Significant improvement in right ankle pain with relative rest and as needed Tylenol use - Patient self discontinued boot 1 week ago because she felt that the boot was too heavy and causing her more discomfort than help.  She is currently walking without pain - May continue physical activities as tolerated - Continue Tylenol as needed for pain relief - No further treatment needed at this time   Pertinent previous records reviewed include none   Follow Up: As needed   Subjective:   I, Holly [name], am serving as a Education administrator for Doctor Glennon Mac   Chief Complaint: left wrist pain    HPI:    09/03/22 Patient is a 88 year old female complaining of left wrist pain. Patient states that she tripped over something in her yard that caused her to fall.  She sustained a minimal abrasion above her upper lip.  She did not lose consciousness.  She denies headache, confusion, vomiting, neck pain, weakness in the arms of the legs.  Pain is worse with movement.  No treatments prior to arrival.  She is not on anticoagulation.  No shoulder or elbow pain. Three weeks , swollen and has a know compared bilaterally, gets really cold and her fingertips will turn blue , no meds for the pain , she wears the brace it helps relief the pressure    09/24/2022  Patient states feels like her hand is good she has no problems with it    10/08/2022 Patient states she woke up this morning soaking wet , thinks she had a fever and took a tylenol, ankle is hot , dog jumped up and pushed her to the ground an , is in a walking boot , when she has pressure on it the pain is decreased    Slight wrist pain when she points down   10/20/2022 Patient states she is  little sore with flexion then she also  has a little pain in her heel but it aint that bad, she is now complaining of neck pain    Relevant Historical Information: History of CVA, COPD  Additional pertinent review of systems negative.   Current Outpatient Medications:    acetaminophen (TYLENOL) 500 MG tablet, Take 500 mg by mouth every 6 (six) hours as needed for mild pain. , Disp: , Rfl:    albuterol (PROVENTIL HFA) 108 (90 Base) MCG/ACT inhaler, Inhale 1-2 puffs into the lungs every 6 (six) hours as needed for wheezing or shortness of breath., Disp: 3 each, Rfl: 3   aspirin EC 81 MG tablet, Take 1 tablet (81 mg total) by mouth daily. Swallow whole., Disp: 30 tablet, Rfl: 11   atorvastatin (LIPITOR) 40 MG tablet, TAKE 1 TABLET BY MOUTH EVERY DAY, Disp: 90 tablet, Rfl: 3   azelastine (ASTELIN) 0.1 % nasal spray, Place 2 sprays into both nostrils 2 (two) times daily., Disp: 30 mL, Rfl: 12   budesonide-formoterol (SYMBICORT) 160-4.5 MCG/ACT inhaler, Inhale 2 puffs into the lungs 2 (two) times daily., Disp: 1 each, Rfl: 3   levothyroxine (SYNTHROID) 100 MCG tablet, Take 1 tablet (100 mcg total) by mouth daily., Disp: 90 tablet, Rfl: 1   Multiple Vitamins-Minerals (ICAPS PO), Take 1 capsule by mouth 2 (two) times daily., Disp: , Rfl:    Objective:  Vitals:   10/20/22 1257  Pulse: (!) 57  SpO2: 97%  Weight: 84 lb (38.1 kg)  Height: '5\' 2"'$  (1.575 m)      Body mass index is 15.36 kg/m.    Physical Exam:    Gen: Appears well, nad, nontoxic and pleasant Psych: Alert and oriented, appropriate mood and affect Neuro: sensation intact, strength is 5/5 with df/pf/inv/ev, muscle tone wnl Skin: no susupicious lesions or rashes.  Varicose veins throughout lower extremities bilaterally.  Darkening skin discoloration over anterior shin   Right foot/ankle: no deformity, no swelling or effusion NTTP mildly lateral malleolus, ATFL, CFL NTTP over fibular head,   medial mal, achilles, navicular, base of 5th,  deltoid, calcaneous or  midfoot ROM DF 30, PF 45, inv/ev intact Negative ant drawer, talar tilt, rotation test, squeeze test. Neg thompson no  pain with resisted inversion or eversion over lateral ankle    Electronically signed by:  Holly [name] D.[name] 1:09 PM 10/20/22

## 2022-10-20 ENCOUNTER — Ambulatory Visit (INDEPENDENT_AMBULATORY_CARE_PROVIDER_SITE_OTHER): Payer: Medicare Other | Admitting: Sports Medicine

## 2022-10-20 VITALS — HR 57 | Ht 62.0 in | Wt 84.0 lb

## 2022-10-20 DIAGNOSIS — M25571 Pain in right ankle and joints of right foot: Secondary | ICD-10-CM

## 2022-10-20 NOTE — Telephone Encounter (Signed)
Dr. Jerline Pain, Rx for Advair 500-50 mcg is on backorder. Pharmacy requesting something be prescribed. Please advise.

## 2022-10-20 NOTE — Patient Instructions (Signed)
Good to see you   

## 2022-11-05 ENCOUNTER — Encounter: Payer: Self-pay | Admitting: Family Medicine

## 2022-11-05 ENCOUNTER — Ambulatory Visit (INDEPENDENT_AMBULATORY_CARE_PROVIDER_SITE_OTHER): Payer: Medicare Other | Admitting: Family Medicine

## 2022-11-05 ENCOUNTER — Telehealth: Payer: Self-pay | Admitting: Family Medicine

## 2022-11-05 VITALS — BP 118/70 | HR 65 | Temp 97.7°F | Ht 62.0 in | Wt 85.0 lb

## 2022-11-05 DIAGNOSIS — J40 Bronchitis, not specified as acute or chronic: Secondary | ICD-10-CM

## 2022-11-05 DIAGNOSIS — Z23 Encounter for immunization: Secondary | ICD-10-CM

## 2022-11-05 MED ORDER — ALBUTEROL SULFATE HFA 108 (90 BASE) MCG/ACT IN AERS: 1.0000 | INHALATION_SPRAY | Freq: Four times a day (QID) | RESPIRATORY_TRACT | 0 refills | Status: DC | PRN

## 2022-11-05 MED ORDER — AZITHROMYCIN 250 MG PO TABS: ORAL_TABLET | ORAL | 0 refills | Status: AC

## 2022-11-05 NOTE — Telephone Encounter (Signed)
Type of form received: Disability Placard  Additional comments:   Received by: Osa Craver should be Faxed to:  Form should be mailed to:    Is patient requesting call for pickup: yes   Form placed:  Providers box  Attach charge sheet. yes  Individual made aware of 3-5 business day turn around (Y/N)? y

## 2022-11-05 NOTE — Progress Notes (Signed)
Subjective:     Patient ID: Holly [name], female    DOB: 1928-07-07, 88 y.o.   MRN: [id number]  Chief Complaint  Patient presents with   Cough    Productive cough at times that started about 1 and 1/2 weeks ago   Fatigue    HPI-here w/granddaughter 1.5 wks productive cough-green. Occ sob.Marland Kitchen  "Grouchy" throat, but better..  No energy. Poor appetite.  No f/c.  No v/d.   Health Maintenance Due  Topic Date Due   DEXA SCAN  Never done   DTaP/Tdap/Td (2 - Tdap) 06/11/2018    Past Medical History:  Diagnosis Date   CAD (coronary artery disease)    CELLULITIS 06/18/2008   CHF (congestive heart failure) (HCC)    CHRONIC OBSTRUCTIVE PULMONARY DISEASE, ACUTE EXACERBATION 11/16/2007   COPD 05/18/2007   COVID-19    GERD 12/25/2007   HYPERLIPIDEMIA 05/18/2007   HYPOTHYROIDISM 05/18/2007   Macular degeneration    Stroke (HCC) 08/2018   Urosepsis     Past Surgical History:  Procedure Laterality Date   CATARACT EXTRACTION     DILATION AND CURETTAGE OF UTERUS     KYPHOPLASTY N/A 05/02/2020   Procedure: Thoracic eleven KYPHOPLASTY;  Surgeon: Coletta Memos, MD;  Location: [facility] OR;  Service: Neurosurgery;  Laterality: N/A;  3C   TEE WITHOUT CARDIOVERSION N/A 09/01/2018   Procedure: TRANSESOPHAGEAL ECHOCARDIOGRAM (TEE);  Surgeon: Jodelle Red, MD;  Location: [facility] ENDOSCOPY;  Service: Cardiovascular;  Laterality: N/A;   VARICOSE VEIN SURGERY      Outpatient Medications Prior to Visit  Medication Sig Dispense Refill   acetaminophen (TYLENOL) 500 MG tablet Take 500 mg by mouth every 6 (six) hours as needed for mild pain.      albuterol (PROVENTIL HFA) 108 (90 Base) MCG/ACT inhaler Inhale 1-2 puffs into the lungs every 6 (six) hours as needed for wheezing or shortness of breath. 3 each 3   aspirin EC 81 MG tablet Take 1 tablet (81 mg total) by mouth daily. Swallow whole. 30 tablet 11   atorvastatin (LIPITOR) 40 MG tablet TAKE 1 TABLET BY MOUTH EVERY DAY 90 tablet 3   azelastine (ASTELIN)  0.1 % nasal spray Place 2 sprays into both nostrils 2 (two) times daily. 30 mL 12   budesonide-formoterol (SYMBICORT) 160-4.5 MCG/ACT inhaler Inhale 2 puffs into the lungs 2 (two) times daily. 1 each 3   levothyroxine (SYNTHROID) 100 MCG tablet Take 1 tablet (100 mcg total) by mouth daily. 90 tablet 1   Multiple Vitamins-Minerals (ICAPS PO) Take 1 capsule by mouth 2 (two) times daily.     No facility-administered medications prior to visit.    Allergies  Allergen Reactions   Levofloxacin     REACTION: unspecified   Macrobid [Nitrofurantoin Macrocrystal] Nausea Only   Sulfamethoxazole     REACTION: unspecified   Tramadol Other (See Comments)    Shakes    Amoxicillin Rash    Has patient had a PCN reaction causing immediate rash, facial/tongue/throat swelling, SOB or lightheadedness with hypotension: No Has patient had a PCN reaction causing severe rash involving mucus membranes or skin necrosis: No Has patient had a PCN reaction that required hospitalization: No Has patient had a PCN reaction occurring within the last 10 years: No If all of the above answers are "NO", then may proceed with Cephalosporin use.   Made "bottom area" very raw   ROS neg/noncontributory except as noted HPI/below      Objective:     BP 118/70  Pulse 65   Temp 97.7 F (36.5 C) (Temporal)   Ht 5\' 2"  (1.575 m)   Wt 85 lb (38.6 kg)   SpO2 98%   BMI 15.55 kg/m  Wt Readings from Last 3 Encounters:  11/05/22 85 lb (38.6 kg)  10/20/22 84 lb (38.1 kg)  10/08/22 85 lb (38.6 kg)    Physical Exam   Gen: WDWN NAD elderly female.  HEENT: NCAT, conjunctiva not injected, sclera nonicteric NECK:  supple, no thyromegaly, no nodes, no carotid bruits CARDIAC: RRR, S1S2+, + murmur. LUNGS: CTAB. No wheezes.  Some coarseness EXT:  compression stockings.  MSK: no gross abnormalities.  Very kyphotic.  NEURO: A&O x3.  CN II-XII intact.  PSYCH: normal mood. Good eye contact     Assessment & Plan:    Problem List Items Addressed This Visit   None Visit Diagnoses     Bronchitis    -  Primary      Bronchitis-copd stable.  Zpk.  Renewed albuterol.  Worse,ER  Meds ordered this encounter  Medications   azithromycin (ZITHROMAX) 250 MG tablet    Sig: Take 2 tablets on day 1, then 1 tablet daily on days 2 through 5    Dispense:  6 tablet    Refill:  0    Angelena Sole, MD

## 2022-11-05 NOTE — Patient Instructions (Addendum)
It was very nice to see you today!  Don't take atorvastatin for 5 days.  If worse, ER.  Zpk sent and albuterol.    PLEASE NOTE:  If you had any lab tests please let us know if you have not heard back within a few days. You may see your results on MyChart before we have a chance to review them but we will give you a call once they are reviewed by [place]. If we ordered any referrals today, please let us know if you have not heard from their office within the next week.   Please try these tips to maintain a healthy lifestyle:  Eat most of your calories during the day when you are active. Eliminate processed foods including packaged sweets (pies, cakes, cookies), reduce intake of potatoes, white bread, white pasta, and white rice. Look for whole grain options, oat flour or almond flour.  Each meal should contain half fruits/vegetables, one quarter protein, and one quarter carbs (no bigger than a computer mouse).  Cut down on sweet beverages. This includes juice, soda, and sweet tea. Also watch fruit intake, though this is a healthier sweet option, it still contains natural sugar! Limit to 3 servings daily.  Drink at least 1 glass of water with each meal and aim for at least 8 glasses per day  Exercise at least 150 minutes every week.

## 2022-11-08 NOTE — Telephone Encounter (Signed)
Form ready to be pick up  Copy placed to be scan in patient chart  Patient noticed

## 2022-11-09 NOTE — Progress Notes (Unsigned)
    Benito Mccreedy D.[place] Phone: [phone]   Assessment and Plan:     There are no diagnoses linked to this encounter.  ***   Pertinent previous records reviewed include ***   Follow Up: ***     Subjective:   I, Raj Landress, am serving as a Education administrator for Doctor Glennon Mac  Chief Complaint: neck pain   HPI:   11/10/2022 Patient is a 88 year old female complaining of neck pain. Patient states  Relevant Historical Information: ***  Additional pertinent review of systems negative.   Current Outpatient Medications:    acetaminophen (TYLENOL) 500 MG tablet, Take 500 mg by mouth every 6 (six) hours as needed for mild pain. , Disp: , Rfl:    albuterol (PROVENTIL HFA) 108 (90 Base) MCG/ACT inhaler, Inhale 1-2 puffs into the lungs every 6 (six) hours as needed for wheezing or shortness of breath., Disp: 3 each, Rfl: 0   aspirin EC 81 MG tablet, Take 1 tablet (81 mg total) by mouth daily. Swallow whole., Disp: 30 tablet, Rfl: 11   atorvastatin (LIPITOR) 40 MG tablet, TAKE 1 TABLET BY MOUTH EVERY DAY, Disp: 90 tablet, Rfl: 3   azelastine (ASTELIN) 0.1 % nasal spray, Place 2 sprays into both nostrils 2 (two) times daily., Disp: 30 mL, Rfl: 12   azithromycin (ZITHROMAX) 250 MG tablet, Take 2 tablets on day 1, then 1 tablet daily on days 2 through 5, Disp: 6 tablet, Rfl: 0   budesonide-formoterol (SYMBICORT) 160-4.5 MCG/ACT inhaler, Inhale 2 puffs into the lungs 2 (two) times daily., Disp: 1 each, Rfl: 3   levothyroxine (SYNTHROID) 100 MCG tablet, Take 1 tablet (100 mcg total) by mouth daily., Disp: 90 tablet, Rfl: 1   Multiple Vitamins-Minerals (ICAPS PO), Take 1 capsule by mouth 2 (two) times daily., Disp: , Rfl:    Objective:     There were no vitals filed for this visit.    There is no height or weight on file to calculate BMI.    Physical Exam:    ***   Electronically signed by:  Benito Mccreedy D.[facility] 12:54 PM 11/09/22

## 2022-11-10 ENCOUNTER — Ambulatory Visit (INDEPENDENT_AMBULATORY_CARE_PROVIDER_SITE_OTHER): Payer: Medicare Other | Admitting: Sports Medicine

## 2022-11-10 ENCOUNTER — Ambulatory Visit (INDEPENDENT_AMBULATORY_CARE_PROVIDER_SITE_OTHER): Payer: Medicare Other

## 2022-11-10 VITALS — HR 84 | Ht 62.0 in | Wt 80.0 lb

## 2022-11-10 DIAGNOSIS — M542 Cervicalgia: Secondary | ICD-10-CM

## 2022-11-10 NOTE — Patient Instructions (Addendum)
Good to see you Tylenol as needed for pain control Neck HEP Use heating pad over neck as needed  As needed follow up

## 2023-01-17 ENCOUNTER — Encounter: Payer: Self-pay | Admitting: Family Medicine

## 2023-01-18 ENCOUNTER — Other Ambulatory Visit: Payer: Self-pay | Admitting: *Deleted

## 2023-01-18 MED ORDER — BUDESONIDE-FORMOTEROL FUMARATE 160-4.5 MCG/ACT IN AERO: 2.0000 | INHALATION_SPRAY | Freq: Two times a day (BID) | RESPIRATORY_TRACT | 3 refills | Status: DC

## 2023-01-18 MED ORDER — ATORVASTATIN CALCIUM 40 MG PO TABS: 40.0000 mg | ORAL_TABLET | Freq: Every day | ORAL | 3 refills | Status: DC

## 2023-01-18 MED ORDER — LEVOTHYROXINE SODIUM 100 MCG PO TABS: 100.0000 ug | ORAL_TABLET | Freq: Every day | ORAL | 1 refills | Status: DC

## 2023-02-22 NOTE — Progress Notes (Signed)
Holly [name] is a 89 y.o. female here for a new problem.  History of Present Illness:   No chief complaint on file.   HPI  Has been experiencing body aches, congestion, dizziness since    Past Medical History:  Diagnosis Date   CAD (coronary artery disease)    CELLULITIS 06/18/2008   CHF (congestive heart failure) ([place])    CHRONIC OBSTRUCTIVE PULMONARY DISEASE, ACUTE EXACERBATION 11/16/2007   COPD 05/18/2007   COVID-19    GERD 12/25/2007   HYPERLIPIDEMIA 05/18/2007   HYPOTHYROIDISM 05/18/2007   Macular degeneration    Stroke ([place]) 08/2018   Urosepsis      Social History   Tobacco Use   Smoking status: Former    Years: 48    Types: Cigarettes    Quit date: 11/29/1985    Years since quitting: 37.2   Smokeless tobacco: Never  Vaping Use   Vaping Use: Never used  Substance Use Topics   Alcohol use: No   Drug use: No    Past Surgical History:  Procedure Laterality Date   CATARACT EXTRACTION     DILATION AND CURETTAGE OF UTERUS     KYPHOPLASTY N/A 05/02/2020   Procedure: Thoracic eleven KYPHOPLASTY;  Surgeon: Ashok Pall, MD;  Location: [facility];  Service: Neurosurgery;  Laterality: N/A;  3C   TEE WITHOUT CARDIOVERSION N/A 09/01/2018   Procedure: TRANSESOPHAGEAL ECHOCARDIOGRAM (TEE);  Surgeon: Buford Dresser, MD;  Location: [facility] ENDOSCOPY;  Service: Cardiovascular;  Laterality: N/A;   VARICOSE VEIN SURGERY      Family History  Problem Relation Age of Onset   Ovarian cancer Mother    Diabetes Mellitus II Brother     Allergies  Allergen Reactions   Levofloxacin     REACTION: unspecified   Macrobid [Nitrofurantoin Macrocrystal] Nausea Only   Sulfamethoxazole     REACTION: unspecified   Tramadol Other (See Comments)    Shakes    Amoxicillin Rash    Has patient had a PCN reaction causing immediate rash, facial/tongue/throat swelling, SOB or lightheadedness with hypotension: No Has patient had a PCN reaction causing severe rash involving mucus membranes or  skin necrosis: No Has patient had a PCN reaction that required hospitalization: No Has patient had a PCN reaction occurring within the last 10 years: No If all of the above answers are "NO", then may proceed with Cephalosporin use.   Made "bottom area" very raw    Current Medications:   Current Outpatient Medications:    acetaminophen (TYLENOL) 500 MG tablet, Take 500 mg by mouth every 6 (six) hours as needed for mild pain. , Disp: , Rfl:    albuterol (PROVENTIL HFA) 108 (90 Base) MCG/ACT inhaler, Inhale 1-2 puffs into the lungs every 6 (six) hours as needed for wheezing or shortness of breath., Disp: 3 each, Rfl: 0   aspirin EC 81 MG tablet, Take 1 tablet (81 mg total) by mouth daily. Swallow whole., Disp: 30 tablet, Rfl: 11   atorvastatin (LIPITOR) 40 MG tablet, Take 1 tablet (40 mg total) by mouth daily., Disp: 90 tablet, Rfl: 3   azelastine (ASTELIN) 0.1 % nasal spray, Place 2 sprays into both nostrils 2 (two) times daily., Disp: 30 mL, Rfl: 12   budesonide-formoterol (SYMBICORT) 160-4.5 MCG/ACT inhaler, Inhale 2 puffs into the lungs 2 (two) times daily., Disp: 1 each, Rfl: 3   levothyroxine (SYNTHROID) 100 MCG tablet, Take 1 tablet (100 mcg total) by mouth daily., Disp: 90 tablet, Rfl: 1   Multiple Vitamins-Minerals (ICAPS PO),  Take 1 capsule by mouth 2 (two) times daily., Disp: , Rfl:    Review of Systems:   ROS  Vitals:   There were no vitals filed for this visit.   There is no height or weight on file to calculate BMI.  Physical Exam:   Physical Exam  Assessment and Plan:   ***   I,Alexander Ruley,acting as a scribe for Inda Coke, PA.,have documented all relevant documentation on the behalf of Inda Coke, PA,as directed by  Inda Coke, PA while in the presence of Inda Coke, [place].   ***  Inda Coke, PA-C

## 2023-02-23 ENCOUNTER — Ambulatory Visit (INDEPENDENT_AMBULATORY_CARE_PROVIDER_SITE_OTHER): Payer: Medicare Other | Admitting: Physician Assistant

## 2023-02-23 ENCOUNTER — Encounter: Payer: Self-pay | Admitting: Physician Assistant

## 2023-02-23 VITALS — BP 100/60 | HR 68 | Temp 97.7°F | Ht 62.0 in | Wt 85.0 lb

## 2023-02-23 DIAGNOSIS — J029 Acute pharyngitis, unspecified: Secondary | ICD-10-CM

## 2023-02-23 LAB — POC COVID19 BINAXNOW: SARS Coronavirus 2 Ag: NEGATIVE

## 2023-02-23 LAB — POCT RAPID STREP A (OFFICE): Rapid Strep A Screen: POSITIVE — AB

## 2023-02-23 MED ORDER — AZITHROMYCIN 250 MG PO TABS: ORAL_TABLET | ORAL | 0 refills | Status: AC

## 2023-02-23 NOTE — Patient Instructions (Addendum)
It was great to see you!  Start azithromycin  Start plain mucinex  Continue to rinse your mouth after inhalers!  Follow-up if any concerns  Take care,  Inda Coke PA-C

## 2023-03-24 NOTE — Progress Notes (Signed)
Aleen Sells D.[facility] [address] [place] [address] Phone: [phone]   Assessment and Plan:     1. Neck pain 2. Fall, subsequent encounter 3. DDD (degenerative disc disease), cervical  -Chronic with exacerbation, subsequent visit - Patient is continuing to experience neck pain for the past 6 months since fall on 09/26/2022.  Due to continued pain that has not improved with conservative therapy, I feel it is necessary to further evaluate with neck MRI at this time.  Order placed today - Recommend continuing Tylenol and heating pads as needed for pain relief - Continue HEP for neck stretching  Pertinent previous records reviewed include none   Follow Up: 3 days after MRI to review results and discuss treatment plan   Subjective:   I, Jerene Canny, am serving as a Neurosurgeon for Doctor Richardean Sale   Chief Complaint: neck pain    HPI:    11/10/2022 Patient is a 89 year old female complaining of neck pain. Patient states that she is so sore neck pain all the way up to the occiput , feels like a strained muscle , decreased ROM , pop and cracks whn she turns her head not painful , tylenol helps with the pain , no numbness or tingling, she gets headaches and if she turn her head to fast she feels like she might pass out   03/25/2023 Patient states she still has neck pain     Relevant Historical Information: History of CVA, COPD  Additional pertinent review of systems negative.   Current Outpatient Medications:    acetaminophen (TYLENOL) 500 MG tablet, Take 500 mg by mouth every 6 (six) hours as needed for mild pain. , Disp: , Rfl:    albuterol (PROVENTIL HFA) 108 (90 Base) MCG/ACT inhaler, Inhale 1-2 puffs into the lungs every 6 (six) hours as needed for wheezing or shortness of breath., Disp: 3 each, Rfl: 0   aspirin EC 81 MG tablet, Take 1 tablet (81 mg total) by mouth daily. Swallow whole., Disp: 30 tablet, Rfl: 11    atorvastatin (LIPITOR) 40 MG tablet, Take 1 tablet (40 mg total) by mouth daily., Disp: 90 tablet, Rfl: 3   azelastine (ASTELIN) 0.1 % nasal spray, Place 2 sprays into both nostrils 2 (two) times daily., Disp: 30 mL, Rfl: 12   budesonide-formoterol (SYMBICORT) 160-4.5 MCG/ACT inhaler, Inhale 2 puffs into the lungs 2 (two) times daily., Disp: 1 each, Rfl: 3   levothyroxine (SYNTHROID) 100 MCG tablet, Take 1 tablet (100 mcg total) by mouth daily., Disp: 90 tablet, Rfl: 1   Multiple Vitamins-Minerals (ICAPS PO), Take 1 capsule by mouth 2 (two) times daily., Disp: , Rfl:    Objective:     Vitals:   03/25/23 1035  Pulse: 86  SpO2: 98%  Weight: 86 lb (39 kg)  Height: 5\' 2"  (1.575 m)      Body mass index is 15.73 kg/m.    Physical Exam:    Cervical Spine: Posture normal Skin: normal, intact   Neurological:    Strength:   Right  Left  Deltoid (C5) 5/5 5/5 Bicep/Brachioradialis (C5/6) 5/5  5/5 Wrist Extension (C6) 5/5 5/5 Tricep (C7) 5/5 5/5 Wrist Flexion (C7) 5/5 5/5 Grip (C8) 5/5 5/5 Finger Abduction (T1) 5/5 5/5   Sensation: intact to light touch in upper extremities bilaterally   Spurling's:  negative bilaterally Neck ROM: Mild restriction in left rotation and sidebending due to right-sided paraspinal tightness, otherwise full active ROM TTP:  Bilateral cervical paraspinal, bilateral trapezius NTTP: cervical spinous processes,  thoracic paraspinal,       Electronically signed by:  Aleen Sells D.[facility] 10:44 AM 03/25/23

## 2023-03-25 ENCOUNTER — Ambulatory Visit (INDEPENDENT_AMBULATORY_CARE_PROVIDER_SITE_OTHER): Payer: Medicare Other | Admitting: Sports Medicine

## 2023-03-25 VITALS — HR 86 | Ht 62.0 in | Wt 86.0 lb

## 2023-03-25 DIAGNOSIS — M5030 Other cervical disc degeneration, unspecified cervical region: Secondary | ICD-10-CM | POA: Diagnosis not present

## 2023-03-25 DIAGNOSIS — W19XXXD Unspecified fall, subsequent encounter: Secondary | ICD-10-CM | POA: Diagnosis not present

## 2023-03-25 DIAGNOSIS — M542 Cervicalgia: Secondary | ICD-10-CM

## 2023-03-25 NOTE — Patient Instructions (Signed)
MRI referral  Follow up 3 days after

## 2023-04-14 ENCOUNTER — Other Ambulatory Visit: Payer: Self-pay | Admitting: Sports Medicine

## 2023-04-14 ENCOUNTER — Other Ambulatory Visit: Payer: Self-pay | Admitting: Family Medicine

## 2023-04-14 ENCOUNTER — Encounter: Payer: Self-pay | Admitting: Sports Medicine

## 2023-04-14 DIAGNOSIS — F40240 Claustrophobia: Secondary | ICD-10-CM

## 2023-04-14 MED ORDER — LORAZEPAM 0.5 MG PO TABS
ORAL_TABLET | ORAL | 0 refills | Status: DC
Start: 2023-04-14 — End: 2023-10-01

## 2023-04-14 MED ORDER — ALBUTEROL SULFATE HFA 108 (90 BASE) MCG/ACT IN AERS: 1.0000 | INHALATION_SPRAY | Freq: Four times a day (QID) | RESPIRATORY_TRACT | 0 refills | Status: DC | PRN

## 2023-04-14 NOTE — Progress Notes (Signed)
Ativan sent in for claustrophobia during MRI scan.

## 2023-04-15 ENCOUNTER — Encounter: Payer: Self-pay | Admitting: Family Medicine

## 2023-04-15 ENCOUNTER — Ambulatory Visit (INDEPENDENT_AMBULATORY_CARE_PROVIDER_SITE_OTHER): Payer: Medicare Other | Admitting: Family Medicine

## 2023-04-15 VITALS — BP 138/65 | HR 64 | Temp 98.0°F | Ht 62.0 in | Wt 85.8 lb

## 2023-04-15 DIAGNOSIS — R5383 Other fatigue: Secondary | ICD-10-CM | POA: Diagnosis not present

## 2023-04-15 DIAGNOSIS — J449 Chronic obstructive pulmonary disease, unspecified: Secondary | ICD-10-CM

## 2023-04-15 DIAGNOSIS — E038 Other specified hypothyroidism: Secondary | ICD-10-CM | POA: Diagnosis not present

## 2023-04-15 DIAGNOSIS — E785 Hyperlipidemia, unspecified: Secondary | ICD-10-CM

## 2023-04-15 DIAGNOSIS — R739 Hyperglycemia, unspecified: Secondary | ICD-10-CM

## 2023-04-15 LAB — CBC
Hemoglobin: 12.7 g/dL (ref 11.7–15.5)
MCV: 93.2 fL (ref 80.0–100.0)
RBC: 4.12 10*6/uL (ref 3.80–5.10)

## 2023-04-15 MED ORDER — AEROCHAMBER PLUS FLO-VU MEDIUM MISC: 0 refills | Status: DC

## 2023-04-15 MED ORDER — ALBUTEROL SULFATE HFA 108 (90 BASE) MCG/ACT IN AERS: 1.0000 | INHALATION_SPRAY | Freq: Four times a day (QID) | RESPIRATORY_TRACT | 0 refills | Status: DC | PRN

## 2023-04-15 MED ORDER — BUDESONIDE-FORMOTEROL FUMARATE 160-4.5 MCG/ACT IN AERO: 2.0000 | INHALATION_SPRAY | Freq: Two times a day (BID) | RESPIRATORY_TRACT | 3 refills | Status: DC

## 2023-04-15 NOTE — Assessment & Plan Note (Signed)
On Synthroid 100 mcg daily. Check TSH.  

## 2023-04-15 NOTE — Addendum Note (Signed)
Addended by: Nash Shearer D on: 04/15/2023 01:59 PM   Modules accepted: Orders

## 2023-04-15 NOTE — Assessment & Plan Note (Signed)
On Lipitor 40 mg daily.  Check lipids. 

## 2023-04-15 NOTE — Assessment & Plan Note (Signed)
On Symbicort and albuterol.  She is having some issues with actuation of the inhaler.  We discussed options including trial of different inhalers versus nebulizer versus spacer.  They are interested in trying spacer.  Will send this into the pharmacy today.

## 2023-04-15 NOTE — Progress Notes (Signed)
   Holly [name] is a 89 y.o. female who presents today for an office visit.  Assessment/Plan:  New/Acute Problems: Other fatigue Likely multifactorial.  Will check labs including CBC, c-Met, TSH, and B12.  Chronic Problems Addressed Today: COPD mixed type (HCC) On Symbicort and albuterol.  She is having some issues with actuation of the inhaler.  We discussed options including trial of different inhalers versus nebulizer versus spacer.  They are interested in trying spacer.  Will send this into the pharmacy today.  Dyslipidemia On Lipitor 40 mg daily.  Check lipids.  Hypothyroidism On Synthroid 100 mcg daily.  Check TSH.     Subjective:  HPI:  See Assessment / plan for status of chronic conditions.  Recently she injured her neck after being knocked down by dog.  Has been following with sports medicine for this.  She has been taking Tylenol for pain which works recently well.  She has upcoming MRI for this.  Needs refill on medications today.  Otherwise has been doing well.  She does note that is been difficult for her to use her inhaler properly due to being unable to actuate effectively.  She also does note she has been more tired here recently.  No obvious exacerbating factors.       Objective:  Physical Exam: BP 138/65   Pulse 64   Temp 98 F (36.7 C) (Temporal)   Ht 5\' 2"  (1.575 m)   Wt 85 lb 12.8 oz (38.9 kg)   SpO2 99%   BMI 15.69 kg/m   Wt Readings from Last 3 Encounters:  04/15/23 85 lb 12.8 oz (38.9 kg)  03/25/23 86 lb (39 kg)  02/23/23 85 lb (38.6 kg)    Gen: No acute distress, resting comfortably CV: Regular rate and rhythm with no murmurs appreciated Pulm: Normal work of breathing, clear to auscultation bilaterally with no crackles, wheezes, or rhonchi Neuro: Grossly normal, moves all extremities Psych: Normal affect and thought content      Tequia Wolman M. Jimmey Ralph, MD 04/15/2023 1:51 PM

## 2023-04-15 NOTE — Addendum Note (Signed)
Addended by: Nash Shearer D on: 04/15/2023 02:00 PM   Modules accepted: Orders

## 2023-04-15 NOTE — Patient Instructions (Signed)
It was very nice to see you today!  We will check blood work.  Please start using a spacer to help with your inhaler.  I will refill your medications.  Return in about 1 year (around 04/14/2024).   Take care, Dr Jimmey Ralph  PLEASE NOTE:  If you had any lab tests, please let us know if you have not heard back within a few days. You may see your results on mychart before we have a chance to review them but we will give you a call once they are reviewed by [place].   If we ordered any referrals today, please let us know if you have not heard from their office within the next week.   If you had any urgent prescriptions sent in today, please check with the pharmacy within an hour of our visit to make sure the prescription was transmitted appropriately.   Please try these tips to maintain a healthy lifestyle:  Eat at least 3 REAL meals and 1-2 snacks per day.  Aim for no more than 5 hours between eating.  If you eat breakfast, please do so within one hour of getting up.   Each meal should contain half fruits/vegetables, one quarter protein, and one quarter carbs (no bigger than a computer mouse)  Cut down on sweet beverages. This includes juice, soda, and sweet tea.   Drink at least 1 glass of water with each meal and aim for at least 8 glasses per day  Exercise at least 150 minutes every week.

## 2023-04-16 LAB — COMPREHENSIVE METABOLIC PANEL
AG Ratio: 1.4 (calc) (ref 1.0–2.5)
ALT: 9 U/L (ref 6–29)
AST: 20 U/L (ref 10–35)
Albumin: 3.9 g/dL (ref 3.6–5.1)
Alkaline phosphatase (APISO): 79 U/L (ref 37–153)
BUN/Creatinine Ratio: 22 (calc) (ref 6–22)
BUN: 23 mg/dL (ref 7–25)
CO2: 27 mmol/L (ref 20–32)
Calcium: 9.3 mg/dL (ref 8.6–10.4)
Chloride: 99 mmol/L (ref 98–110)
Creat: 1.06 mg/dL — ABNORMAL HIGH (ref 0.60–0.95)
Globulin: 2.8 g/dL (calc) (ref 1.9–3.7)
Glucose, Bld: 85 mg/dL (ref 65–99)
Potassium: 4.8 mmol/L (ref 3.5–5.3)
Sodium: 136 mmol/L (ref 135–146)
Total Bilirubin: 0.9 mg/dL (ref 0.2–1.2)
Total Protein: 6.7 g/dL (ref 6.1–8.1)

## 2023-04-16 LAB — HEMOGLOBIN A1C
Hgb A1c MFr Bld: 5.5 % of total Hgb (ref <5.7)
Mean Plasma Glucose: 111 mg/dL
eAG (mmol/L): 6.2 mmol/L

## 2023-04-16 LAB — TSH: TSH: 4.86 mIU/L — ABNORMAL HIGH (ref 0.40–4.50)

## 2023-04-16 LAB — CBC
HCT: 38.4 % (ref 35.0–45.0)
MCH: 30.8 pg (ref 27.0–33.0)
MCHC: 33.1 g/dL (ref 32.0–36.0)
MPV: 10 fL (ref 7.5–12.5)
Platelets: 202 10*3/uL (ref 140–400)
RDW: 13.2 % (ref 11.0–15.0)
WBC: 5.5 10*3/uL (ref 3.8–10.8)

## 2023-04-16 LAB — VITAMIN B12: Vitamin B-12: 361 pg/mL (ref 200–1100)

## 2023-04-16 LAB — LIPID PANEL
Cholesterol: 133 mg/dL (ref <200)
HDL: 60 mg/dL (ref >=50)
LDL Cholesterol (Calc): 59 mg/dL (calc)
Non-HDL Cholesterol (Calc): 73 mg/dL (calc) (ref <130)
Total CHOL/HDL Ratio: 2.2 (calc) (ref <5.0)
Triglycerides: 68 mg/dL (ref <150)

## 2023-04-19 ENCOUNTER — Ambulatory Visit
Admission: RE | Admit: 2023-04-19 | Discharge: 2023-04-19 | Disposition: A | Discharge status: Home / Self Care | Payer: Medicare Other | Source: Ambulatory Visit | Attending: Sports Medicine | Admitting: Sports Medicine

## 2023-04-19 DIAGNOSIS — M5030 Other cervical disc degeneration, unspecified cervical region: Secondary | ICD-10-CM

## 2023-04-19 DIAGNOSIS — M542 Cervicalgia: Secondary | ICD-10-CM

## 2023-04-19 DIAGNOSIS — W19XXXD Unspecified fall, subsequent encounter: Secondary | ICD-10-CM

## 2023-04-19 NOTE — Progress Notes (Signed)
Her thyroid level is just a little off.  Can we please verify that she has been taking Synthroid 100 mcg daily?  If so recommend we increase to 112 mcg daily and have her come back in 4 to 6 weeks to recheck.  Please place future order for TSH.  The rest of her labs are all stable and we can recheck in a year.

## 2023-04-22 ENCOUNTER — Other Ambulatory Visit: Payer: Self-pay | Admitting: *Deleted

## 2023-04-22 DIAGNOSIS — E038 Other specified hypothyroidism: Secondary | ICD-10-CM

## 2023-04-22 MED ORDER — LEVOTHYROXINE SODIUM 112 MCG PO TABS: 112.0000 ug | ORAL_TABLET | Freq: Every day | ORAL | 0 refills | Status: DC

## 2023-05-10 NOTE — Progress Notes (Signed)
    Aleen Sells D.[facility] [address] [place] [address] Phone: [phone]   Assessment and Plan:     There are no diagnoses linked to this encounter.  ***   Pertinent previous records reviewed include ***   Follow Up: ***     Subjective:   I, Holly [name], am serving as a Neurosurgeon for Doctor Richardean Sale   Chief Complaint: neck pain    HPI:    11/10/2022 Patient is a 89 year old female complaining of neck pain. Patient states that she is so sore neck pain all the way up to the occiput , feels like a strained muscle , decreased ROM , pop and cracks whn she turns her head not painful , tylenol helps with the pain , no numbness or tingling, she gets headaches and if she turn her head to fast she feels like she might pass out    03/25/2023 Patient states she still has neck pain     05/11/2023 Patient states    Relevant Historical Information: History of CVA, COPD  Additional pertinent review of systems negative.   Current Outpatient Medications:    acetaminophen (TYLENOL) 500 MG tablet, Take 500 mg by mouth every 6 (six) hours as needed for mild pain. , Disp: , Rfl:    albuterol (PROVENTIL HFA) 108 (90 Base) MCG/ACT inhaler, Inhale 1-2 puffs into the lungs every 6 (six) hours as needed for wheezing or shortness of breath., Disp: 3 each, Rfl: 0   aspirin EC 81 MG tablet, Take 1 tablet (81 mg total) by mouth daily. Swallow whole., Disp: 30 tablet, Rfl: 11   atorvastatin (LIPITOR) 40 MG tablet, Take 1 tablet (40 mg total) by mouth daily., Disp: 90 tablet, Rfl: 3   azelastine (ASTELIN) 0.1 % nasal spray, Place 2 sprays into both nostrils 2 (two) times daily., Disp: 30 mL, Rfl: 12   budesonide-formoterol (SYMBICORT) 160-4.5 MCG/ACT inhaler, Inhale 2 puffs into the lungs 2 (two) times daily., Disp: 1 each, Rfl: 3   levothyroxine (SYNTHROID) 112 MCG tablet, Take 1 tablet (112 mcg total) by mouth daily., Disp: 90 tablet, Rfl: 0    LORazepam (ATIVAN) 0.5 MG tablet, 1-2 tabs 30 - 60 min prior to MRI. Do not drive with this medicine., Disp: 4 tablet, Rfl: 0   Multiple Vitamins-Minerals (ICAPS PO), Take 1 capsule by mouth 2 (two) times daily., Disp: , Rfl:    Spacer/Aero-Holding Chambers (AEROCHAMBER PLUS FLO-VU MEDIUM) MISC, Use daily as needed, Disp: 1 each, Rfl: 0   Objective:     There were no vitals filed for this visit.    There is no height or weight on file to calculate BMI.    Physical Exam:    ***   Electronically signed by:  Aleen Sells D.[facility] 7:08 AM 05/10/23

## 2023-05-11 ENCOUNTER — Ambulatory Visit (INDEPENDENT_AMBULATORY_CARE_PROVIDER_SITE_OTHER): Payer: Medicare Other | Admitting: Sports Medicine

## 2023-05-11 VITALS — HR 75 | Ht 62.0 in | Wt 87.0 lb

## 2023-05-11 DIAGNOSIS — M5030 Other cervical disc degeneration, unspecified cervical region: Secondary | ICD-10-CM | POA: Diagnosis not present

## 2023-05-11 DIAGNOSIS — M542 Cervicalgia: Secondary | ICD-10-CM

## 2023-05-11 NOTE — Patient Instructions (Signed)
Tylenol [phone] mg 2-3 times a day for pain relief  Neck HEP  Heating pads, topical creams over areas of pain As needed follow up

## 2023-05-13 ENCOUNTER — Other Ambulatory Visit: Payer: Medicare Other

## 2023-05-16 ENCOUNTER — Other Ambulatory Visit (INDEPENDENT_AMBULATORY_CARE_PROVIDER_SITE_OTHER): Payer: Medicare Other

## 2023-05-16 DIAGNOSIS — E038 Other specified hypothyroidism: Secondary | ICD-10-CM

## 2023-05-16 LAB — TSH: TSH: 2.73 u[IU]/mL (ref 0.35–5.50)

## 2023-05-17 NOTE — Progress Notes (Signed)
Great news! Thyroid level is at goal.  He can continue her current dose and recheck again in 6 to 12 months.

## 2023-06-28 DIAGNOSIS — L719 Rosacea, unspecified: Secondary | ICD-10-CM | POA: Diagnosis not present

## 2023-06-28 DIAGNOSIS — L570 Actinic keratosis: Secondary | ICD-10-CM | POA: Diagnosis not present

## 2023-06-28 DIAGNOSIS — L821 Other seborrheic keratosis: Secondary | ICD-10-CM | POA: Diagnosis not present

## 2023-07-11 ENCOUNTER — Other Ambulatory Visit: Payer: Self-pay | Admitting: Family Medicine

## 2023-07-14 ENCOUNTER — Encounter (INDEPENDENT_AMBULATORY_CARE_PROVIDER_SITE_OTHER): Payer: Self-pay

## 2023-07-18 ENCOUNTER — Telehealth: Payer: Self-pay | Admitting: Family Medicine

## 2023-07-18 NOTE — Telephone Encounter (Signed)
Final Outcome: Go to ED Now. Per note, patient stated she would comply with going to the ED.   Patient Name First: Holly Last: [name] Gender: Female DOB: 1928-05-28 Age: 89 Y 6 M 15 D Return Phone Number: [phone] (Primary), [phone] (Secondary), [phone] (Alternate) Address: City/ State/ Zip: [place] [address] Client [facility] [employer] [facility] Provider Jacquiline Doe- MD Contact Type Call Who Is Calling Patient / Member / Family / Caregiver Call Type Triage / Clinical Caller Name Lawanna Kobus Relationship To Patient Care Giver Return Phone Number [phone] (Alternate) Chief Complaint BREATHING - shortness of breath or sounds breathless Reason for Call Symptomatic / Request for Health Information Initial Comment Caller states patient has been feeling sick for 4 days. Patient is having shortness of breath, body shakes. Translation No Nurse Assessment Nurse: [place], RN, Marchelle Folks Date/Time (Eastern Time): 07/18/2023 3:31:24 PM Confirm and document reason for call. If symptomatic, describe symptoms. ---caller states patient is short of breath. yesterday could only take a few steps and was fatigued. loss of appetite. had to use inhaler yesterday Does the patient have any new or worsening symptoms? ---Yes Will a triage be completed? ---Yes Related visit to physician within the last 2 weeks? ---No Does the PT have any chronic conditions? (i.e. diabetes, asthma, this includes High risk factors for pregnancy, etc.) ---Yes List chronic conditions. ---copd, asthma, cva 57yrs ago Is this a behavioral health or substance abuse call? ---No Guidelines Guideline Title Affirmed Question Affirmed Notes Nurse Date/Time (Eastern Time) COPD Oxygen Monitoring and Hypoxia [1] MODERATE difficulty breathing (e.g., speaks in phrases, SOB even at [facility], RN, Marchelle Folks 07/18/2023  3:32:56 PM Guidelines Guideline Title Affirmed Question Affirmed Notes Nurse Date/Time Lamount Cohen Time) rest) AND [2] worse than normal Disp. Time Lamount Cohen Time) Disposition Final User 07/18/2023 3:28:45 PM Send to Urgent Queue Norlene Duel 07/18/2023 3:34:10 PM Go to ED Now (or PCP triage) Yes [place], RN, Marchelle Folks Final Disposition 07/18/2023 3:34:10 PM Go to ED Now (or PCP triage) Yes [place], RN, Earnestine Leys Disagree/Comply Comply Caller Understands Yes PreDisposition Did not know what to do Care Advice Given Per Guideline GO TO ED NOW (OR PCP TRIAGE): * IF NO PCP (PRIMARY CARE PROVIDER) SECOND-LEVEL TRIAGE: You need to be seen within the next hour. Go to the ED/UCC at _____________ Hospital. Leave as soon as you can. CARE ADVICE given per COPD Oxygen Monitoring and Hypoxia (Adult) guideline.

## 2023-07-18 NOTE — Telephone Encounter (Signed)
FYI: This call has been transferred to triage nurse: the Triage Nurse. Once the result note has been entered staff can address the message at that time.  Patient called in with the following symptoms:  Red Word:dizziness , shakes, SOB, tired, no energy and loss of appetite  - 4 days and getting worse.   Please advise at Mobile [phone] (mobile)  Message is routed to Provider Pool.

## 2023-07-19 NOTE — Telephone Encounter (Signed)
See note

## 2023-07-20 ENCOUNTER — Encounter: Payer: Self-pay | Admitting: Family Medicine

## 2023-07-20 ENCOUNTER — Ambulatory Visit (INDEPENDENT_AMBULATORY_CARE_PROVIDER_SITE_OTHER): Payer: Medicare Other | Admitting: Family Medicine

## 2023-07-20 VITALS — BP 151/60 | HR 68 | Temp 97.0°F | Ht 62.0 in | Wt 85.0 lb

## 2023-07-20 DIAGNOSIS — R300 Dysuria: Secondary | ICD-10-CM

## 2023-07-20 DIAGNOSIS — L821 Other seborrheic keratosis: Secondary | ICD-10-CM | POA: Diagnosis not present

## 2023-07-20 DIAGNOSIS — R0602 Shortness of breath: Secondary | ICD-10-CM

## 2023-07-20 DIAGNOSIS — J449 Chronic obstructive pulmonary disease, unspecified: Secondary | ICD-10-CM

## 2023-07-20 LAB — POC COVID19 BINAXNOW: SARS Coronavirus 2 Ag: NEGATIVE

## 2023-07-20 LAB — POCT INFLUENZA A/B
Influenza A, POC: NEGATIVE
Influenza B, POC: NEGATIVE

## 2023-07-20 MED ORDER — PREDNISONE 20 MG PO TABS: 20.0000 mg | ORAL_TABLET | Freq: Every day | ORAL | 0 refills | Status: DC

## 2023-07-20 MED ORDER — AZITHROMYCIN 250 MG PO TABS: ORAL_TABLET | ORAL | 0 refills | Status: DC

## 2023-07-20 NOTE — Patient Instructions (Addendum)
It was very nice to see you today!  Please start the prednisone and antibiotic.  Make sure you are getting plenty of fluids.  Let us know if not improving.  Will check a urine sample today.  Return if symptoms worsen or fail to improve.   Take care, Dr Jimmey Ralph  PLEASE NOTE:  If you had any lab tests, please let us know if you have not heard back within a few days. You may see your results on mychart before we have a chance to review them but we will give you a call once they are reviewed by [place].   If we ordered any referrals today, please let us know if you have not heard from their office within the next week.   If you had any urgent prescriptions sent in today, please check with the pharmacy within an hour of our visit to make sure the prescription was transmitted appropriately.   Please try these tips to maintain a healthy lifestyle:  Eat at least 3 REAL meals and 1-2 snacks per day.  Aim for no more than 5 hours between eating.  If you eat breakfast, please do so within one hour of getting up.   Each meal should contain half fruits/vegetables, one quarter protein, and one quarter carbs (no bigger than a computer mouse)  Cut down on sweet beverages. This includes juice, soda, and sweet tea.   Drink at least 1 glass of water with each meal and aim for at least 8 glasses per day  Exercise at least 150 minutes every week.

## 2023-07-20 NOTE — Progress Notes (Signed)
   Holly [name] is a 89 y.o. female who presents today for an office visit.  Assessment/Plan:  New/Acute Problems: Cough / Shortness of Breath Likely mild COPD flare.  COVID and flu test negative today.  She is satting 98% on room air today without any signs of respiratory distress.  She does have mild wheezes on exam and has had significant improvement with albuterol-think this is likely pulmonary in etiology.  We will start prednisone burst and azithromycin to treat for COPD flare.  She will let us know if not improving and would consider chest x-ray at that time.  She is not having any chest pain, orthopnea or lower extremity concerning for CHF or other cardiac etiology.  Well score 0-doubt PE.  We discussed reasons to return to care and seek emergent care.  Dysuria Check urine culture to rule out UTI.  Chronic Problems Addressed Today: COPD mixed type (HCC) Mild flare likely to a URI that she required a couple of weeks ago.  Given length of symptoms we will treat with prednisone and azithromycin at this point.  She will continue her home Symbicort and albuterol.  She will let us know if not improving and would consider imaging versus referral to pulmonology at that time.  We discussed reasons to return to care and seek emergent care.  Seborrheic keratoses Still with lesions on her face.  She had previously seen dermatology however did not like the cream that they gave her.  Discussed with patient that this is a benign process and should not cause her any issues going forward though she can follow-up with dermatology again if needed for more definitive management.     Subjective:  HPI:  See A/P for status of chronic conditions.  Patient is here with 2 weeks of shortness of breath, headache, and fatigue.  She having some cough.  Having a hard time catching her breath. She has been taking her albuterol which does help. She is having some intermittent dizziness as well. No fevers but some  chills. Symptoms all started suddenly. Some nasal congestion. Sometimes her cough is productive of whitish yellow phlegm. No wheezing.  No chest pain. Symptoms started after attending a funeral and she thinks she may have gotten something from somebody at the funeral.  Overall symptoms have been stable for the last several days.  Albuterol helps significantly with her shortness of breath.  She also been having some ongoing lower abdominal discomfort and pain with urination.  She is concerned about possible UTI.  This only happens intermittently.       Objective:  Physical Exam: BP (!) 151/60   Pulse 68   Temp (!) 97 F (36.1 C) (Temporal)   Ht 5\' 2"  (1.575 m)   Wt 85 lb (38.6 kg)   SpO2 98%   BMI 15.55 kg/m   Wt Readings from Last 3 Encounters:  07/20/23 85 lb (38.6 kg)  05/11/23 87 lb (39.5 kg)  04/15/23 85 lb 12.8 oz (38.9 kg)    Gen: No acute distress, resting comfortably CV: Regular rate and rhythm with no murmurs appreciated Pulm: Normal work of breathing, speaking in full sentences.  Faint end expiratory wheezes at bilateral bases otherwise clear to auscultation bilaterally. Neuro: Grossly normal, moves all extremities Psych: Normal affect and thought content      Othon Guardia M. Jimmey Ralph, MD 07/20/2023 10:39 AM

## 2023-07-20 NOTE — Assessment & Plan Note (Signed)
Mild flare likely to a URI that she required a couple of weeks ago.  Given length of symptoms we will treat with prednisone and azithromycin at this point.  She will continue her home Symbicort and albuterol.  She will let us know if not improving and would consider imaging versus referral to pulmonology at that time.  We discussed reasons to return to care and seek emergent care.

## 2023-07-20 NOTE — Assessment & Plan Note (Signed)
Still with lesions on her face.  She had previously seen dermatology however did not like the cream that they gave her.  Discussed with patient that this is a benign process and should not cause her any issues going forward though she can follow-up with dermatology again if needed for more definitive management.

## 2023-07-21 LAB — URINE CULTURE
MICRO NUMBER:: 15362163
SPECIMEN QUALITY:: ADEQUATE

## 2023-07-22 NOTE — Progress Notes (Signed)
Urine culture is negative for urinary tract infection.

## 2023-07-27 ENCOUNTER — Other Ambulatory Visit: Payer: Self-pay | Admitting: Family Medicine

## 2023-08-09 DIAGNOSIS — L219 Seborrheic dermatitis, unspecified: Secondary | ICD-10-CM | POA: Diagnosis not present

## 2023-08-09 DIAGNOSIS — L719 Rosacea, unspecified: Secondary | ICD-10-CM | POA: Diagnosis not present

## 2023-08-15 ENCOUNTER — Ambulatory Visit (INDEPENDENT_AMBULATORY_CARE_PROVIDER_SITE_OTHER): Payer: Medicare Other

## 2023-08-15 VITALS — BP 118/64 | HR 55 | Temp 98.1°F | Wt 86.6 lb

## 2023-08-15 DIAGNOSIS — Z0000 Encounter for general adult medical examination without abnormal findings: Secondary | ICD-10-CM | POA: Diagnosis not present

## 2023-08-15 DIAGNOSIS — Z23 Encounter for immunization: Secondary | ICD-10-CM

## 2023-08-15 NOTE — Patient Instructions (Signed)
Holly [name] , Thank you for taking time to come for your Medicare Wellness Visit. I appreciate your ongoing commitment to your health goals. Please review the following plan we discussed and let me know if I can assist you in the future.   Referrals/Orders/Follow-Ups/Clinician Recommendations: continue to exercise   This is a list of the screening recommended for you and due dates:  Health Maintenance  Topic Date Due   COVID-19 Vaccine (1) Never done   DTaP/Tdap/Td vaccine (2 - Tdap) 06/11/2018   Medicare Annual Wellness Visit  08/10/2023   Zoster (Shingles) Vaccine (1 of 2) 02/23/2024*   Flu Shot  02/27/2024*   DEXA scan (bone density measurement)  04/14/2024*   Pneumonia Vaccine  Completed   HPV Vaccine  Aged Out  *Topic was postponed. The date shown is not the original due date.    Advanced directives: (Declined) Advance directive discussed with you today. Even though you declined this today, please call our office should you change your mind, and we can give you the proper paperwork for you to fill out.  Next Medicare Annual Wellness Visit scheduled for next year: Yes

## 2023-08-15 NOTE — Progress Notes (Signed)
Subjective:   Holly [name] is a 89 y.o. female who presents for Medicare Annual (Subsequent) preventive examination.  Visit Complete: In person  Patient Medicare AWV questionnaire was completed by the patient on 08/15/23; I have confirmed that all information answered by patient is correct and no changes since this date.  Cardiac Risk Factors include: advanced age (>77men, >77 women);dyslipidemia     Objective:    Today's Vitals   08/15/23 1105  BP: 118/64  Pulse: (!) 55  Temp: 98.1 F (36.7 C)  SpO2: 99%  Weight: 86 lb 9.6 oz (39.3 kg)   Body mass index is 15.84 kg/m.     08/15/2023   11:21 AM 04/16/2022    4:16 PM 07/13/2021   11:51 AM 02/05/2021    3:39 PM 07/07/2020   11:53 AM 12/01/2019    3:56 PM 08/31/2018    3:00 AM  Advanced Directives  Does Patient Have a Medical Advance Directive? No No No No  Yes Yes  Type of Advance Directive     Living will;Healthcare Power of [employer] Power of [employer] Power of Attorney  Does patient want to make changes to medical advance directive?       No - Patient declined  Copy of Healthcare Power of Attorney in Chart?     No - copy requested No - copy requested   Would patient like information on creating a medical advance directive? No - Patient declined No - Patient declined Yes (MAU/Ambulatory/Procedural Areas - Information given) No - Patient declined       Current Medications (verified) Outpatient Encounter Medications as of 08/15/2023  Medication Sig   acetaminophen (TYLENOL) 500 MG tablet Take 500 mg by mouth every 6 (six) hours as needed for mild pain.    albuterol (VENTOLIN HFA) 108 (90 Base) MCG/ACT inhaler INHALE 1-2 PUFFS BY MOUTH EVERY 6 HOURS AS NEEDED FOR WHEEZE OR SHORTNESS OF BREATH   aspirin EC 81 MG tablet Take 1 tablet (81 mg total) by mouth daily. Swallow whole.   atorvastatin (LIPITOR) 40 MG tablet Take 1 tablet (40 mg total) by mouth daily.   budesonide-formoterol (SYMBICORT) 160-4.5 MCG/ACT  inhaler Inhale 2 puffs into the lungs 2 (two) times daily.   levothyroxine (SYNTHROID) 112 MCG tablet TAKE 1 TABLET BY MOUTH EVERY DAY   Multiple Vitamins-Minerals (ICAPS PO) Take 1 capsule by mouth 2 (two) times daily.   LORazepam (ATIVAN) 0.5 MG tablet 1-2 tabs 30 - 60 min prior to MRI. Do not drive with this medicine. (Patient not taking: Reported on 08/15/2023)   Spacer/Aero-Holding Chambers (AEROCHAMBER PLUS FLO-VU MEDIUM) MISC Use daily as needed (Patient not taking: Reported on 08/15/2023)   [DISCONTINUED] azelastine (ASTELIN) 0.1 % nasal spray Place 2 sprays into both nostrils 2 (two) times daily.   [DISCONTINUED] azithromycin (ZITHROMAX) 250 MG tablet Take 2 tabs day 1, then 1 tab daily   [DISCONTINUED] predniSONE (DELTASONE) 20 MG tablet Take 1 tablet (20 mg total) by mouth daily with breakfast.   No facility-administered encounter medications on file as of 08/15/2023.    Allergies (verified) Levofloxacin, Macrobid [nitrofurantoin macrocrystal], Sulfamethoxazole, Tramadol, and Amoxicillin   History: Past Medical History:  Diagnosis Date   CAD (coronary artery disease)    CELLULITIS 06/18/2008   CHF (congestive heart failure) (HCC)    CHRONIC OBSTRUCTIVE PULMONARY DISEASE, ACUTE EXACERBATION 11/16/2007   COPD 05/18/2007   COVID-19    GERD 12/25/2007   HYPERLIPIDEMIA 05/18/2007   HYPOTHYROIDISM 05/18/2007   Macular degeneration  Stroke [facility]) 08/2018   Urosepsis    Past Surgical History:  Procedure Laterality Date   CATARACT EXTRACTION     DILATION AND CURETTAGE OF UTERUS     KYPHOPLASTY N/A 05/02/2020   Procedure: Thoracic eleven KYPHOPLASTY;  Surgeon: Coletta Memos, MD;  Location: [facility] OR;  Service: Neurosurgery;  Laterality: N/A;  3C   TEE WITHOUT CARDIOVERSION N/A 09/01/2018   Procedure: TRANSESOPHAGEAL ECHOCARDIOGRAM (TEE);  Surgeon: Jodelle Red, MD;  Location: [facility] ENDOSCOPY;  Service: Cardiovascular;  Laterality: N/A;   VARICOSE VEIN SURGERY     Family History   Problem Relation Age of Onset   Ovarian cancer Mother    Diabetes Mellitus II Brother    Social History   Socioeconomic History   Marital status: Widowed    Spouse name: Not on file   Number of children: Not on file   Years of education: Not on file   Highest education level: Not on file  Occupational History   Occupation: Retired  Tobacco Use   Smoking status: Former    Current packs/day: 0.00    Types: Cigarettes    Start date: 11/29/1945    Quit date: 11/29/1985    Years since quitting: 37.7   Smokeless tobacco: Never  Vaping Use   Vaping status: Never Used  Substance and Sexual Activity   Alcohol use: No   Drug use: No   Sexual activity: Not on file  Other Topics Concern   Not on file  Social History Narrative   Not on file   Social Determinants of Health   Financial Resource Strain: Low Risk  (08/15/2023)   Overall Financial Resource Strain (CARDIA)    Difficulty of Paying Living Expenses: Not hard at all  Food Insecurity: No Food Insecurity (08/15/2023)   Hunger Vital Sign    Worried About Running Out of Food in the Last Year: Never true    Ran Out of Food in the Last Year: Never true  Transportation Needs: No Transportation Needs (08/15/2023)   PRAPARE - Administrator, Civil Service (Medical): No    Lack of Transportation (Non-Medical): No  Physical Activity: Insufficiently Active (08/15/2023)   Exercise Vital Sign    Days of Exercise per Week: 5 days    Minutes of Exercise per Session: 10 min  Stress: No Stress Concern Present (08/15/2023)   [employer] of Occupational Health - Occupational Stress Questionnaire    Feeling of Stress : Not at all  Social Connections: Moderately Integrated (08/15/2023)   Social Connection and Isolation Panel [NHANES]    Frequency of Communication with Friends and Family: Once a week    Frequency of Social Gatherings with Friends and Family: Twice a week    Attends Religious Services: 1 to 4 times per year     Active Member of [employer] or Organizations: Yes    Attends Banker Meetings: 1 to 4 times per year    Marital Status: Widowed    Tobacco Counseling Counseling given: Not Answered   Clinical Intake:  Pre-visit preparation completed: Yes  Pain : No/denies pain     BMI - recorded: 15.84 Nutritional Status: BMI <19  Underweight Nutritional Risks: None Diabetes: No  How often do you need to have someone help you when you read instructions, pamphlets, or other written materials from your doctor or pharmacy?: 5 - Always  Interpreter Needed?: No  Information entered by :: Lanier Ensign, LPN   Activities of Daily Living    08/15/2023  7:23 AM  In your present state of health, do you have any difficulty performing the following activities:  Hearing? 1  Comment has hearing aids  Vision? 1  Difficulty concentrating or making decisions? 1  Comment at times  Walking or climbing stairs? 0  Dressing or bathing? 0  Doing errands, shopping? 1  Preparing Food and eating ? N  Using the Toilet? N  In the past six months, have you accidently leaked urine? Y  Do you have problems with loss of bowel control? N  Managing your Medications? N  Managing your Finances? Y  Housekeeping or managing your Housekeeping? N    Patient Care Team: Ardith Dark, MD as PCP - General (Family Medicine) Marinus Maw, MD as PCP - Cardiology (Cardiology)  Indicate any recent Medical Services you may have received from other than [facility] providers in the past year (date may be approximate).     Assessment:   This is a routine wellness examination for Hollin.  Hearing/Vision screen Hearing Screening - Comments:: Pt has hearing issues  Vision Screening - Comments:: Pt follows up with  Octavia Heir for annual eye exam    Goals Addressed             This Visit's Progress    Patient Stated       Continue to exercise        Depression Screen    08/15/2023   11:18 AM  07/20/2023    9:57 AM 04/15/2023    1:17 PM 08/09/2022   11:44 AM 04/27/2022   11:26 AM 07/20/2021    1:03 PM 07/13/2021   11:49 AM  PHQ 2/9 Scores  PHQ - 2 Score 2 4 0 0 0 0 0  PHQ- 9 Score 3 13         Fall Risk    08/15/2023    7:23 AM 07/20/2023    9:57 AM 04/15/2023    1:18 PM 08/09/2022   11:49 AM 04/27/2022   11:26 AM  Fall Risk   Falls in the past year? 1 1 0 1 0  Number falls in past yr: 1 1 0 1 0  Injury with Fall? 1 1 0 1 0  Comment    left arm sprain   Risk for fall due to : Impaired vision Impaired balance/gait;History of fall(s) No Fall Risks History of fall(s);Impaired mobility;Impaired balance/gait;Impaired vision No Fall Risks  Follow up Falls prevention discussed   Falls prevention discussed     MEDICARE RISK AT HOME: Medicare Risk at Home Any stairs in or around the home?: Yes If so, are there any without handrails?: Yes Home free of loose throw rugs in walkways, pet beds, electrical cords, etc?: Yes Adequate lighting in your home to reduce risk of falls?: Yes Life alert?: No Use of a cane, walker or w/c?: No Grab bars in the bathroom?: No Shower chair or bench in shower?: Yes Elevated toilet seat or a handicapped toilet?: Yes  TIMED UP AND GO:  Was the test performed?  Yes  Length of time to ambulate 10 feet: 10 sec Gait slow and steady without use of assistive device    Cognitive Function:        08/15/2023   11:22 AM 08/09/2022   11:56 AM 07/13/2021   11:57 AM 07/07/2020   12:00 PM  6CIT Screen  What Year? 0 points 0 points 0 points 0 points  What month? 0 points 0 points 0 points 0 points  What time? 0 points 0 points 0 points   Count back from 20 0 points 0 points  0 points  Months in reverse 2 points 0 points 0 points 2 points  Repeat phrase 6 points 4 points 4 points 8 points  Total Score 8 points 4 points      Immunizations Immunization History  Administered Date(s) Administered   Fluad Quad(high Dose 65+) 11/19/2019, 10/10/2020,  11/30/2021, 11/05/2022   Fluad Trivalent(High Dose 65+) 08/15/2023   Influenza Split 08/23/2011, 08/22/2012   Influenza Whole 09/18/2007, 09/10/2008, 09/10/2009, 08/19/2010   Influenza, High Dose Seasonal PF 09/03/2013, 12/23/2014, 09/26/2015, 08/31/2016, 09/07/2017, 09/07/2018   Pneumococcal Conjugate-13 12/23/2014   Pneumococcal Polysaccharide-23 09/17/2013   Td 06/11/2008    TDAP status: Due, Education has been provided regarding the importance of this vaccine. Advised may receive this vaccine at local pharmacy or Health Dept. Aware to provide a copy of the vaccination record if obtained from local pharmacy or Health Dept. Verbalized acceptance and understanding.  Flu Vaccine status: Completed at today's visit  Pneumococcal vaccine status: Up to date  Covid-19 vaccine status: Declined, Education has been provided regarding the importance of this vaccine but patient still declined. Advised may receive this vaccine at local pharmacy or Health Dept.or vaccine clinic. Aware to provide a copy of the vaccination record if obtained from local pharmacy or Health Dept. Verbalized acceptance and understanding.  Qualifies for Shingles Vaccine? Yes   Zostavax completed No   Shingrix Completed?: No.    Education has been provided regarding the importance of this vaccine. Patient has been advised to call insurance company to determine out of pocket expense if they have not yet received this vaccine. Advised may also receive vaccine at local pharmacy or Health Dept. Verbalized acceptance and understanding.  Screening Tests Health Maintenance  Topic Date Due   DTaP/Tdap/Td (2 - Tdap) 06/11/2018   COVID-19 Vaccine (1) 08/31/2023 (Originally 01/02/1933)   Zoster Vaccines- Shingrix (1 of 2) 02/23/2024 (Originally 01/02/1947)   DEXA SCAN  04/14/2024 (Originally 01/02/1995)   Medicare Annual Wellness (AWV)  08/14/2024   Pneumonia Vaccine 48+ Years old  Completed   INFLUENZA VACCINE  Completed   HPV VACCINES   Aged Out    Health Maintenance  Health Maintenance Due  Topic Date Due   DTaP/Tdap/Td (2 - Tdap) 06/11/2018    Colorectal cancer screening: No longer required.   Mammogram status: No longer required due to age .     Additional Screening:  Vision Screening: Recommended annual ophthalmology exams for early detection of glaucoma and other disorders of the eye. Is the patient up to date with their annual eye exam?  Yes  Who is the provider or what is the name of the office in which the patient attends annual eye exams? Dr Lelon Perla  If pt is not established with a provider, would they like to be referred to a provider to establish care? No .   Dental Screening: Recommended annual dental exams for proper oral hygiene    Community Resource Referral / Chronic Care Management: CRR required this visit?  No   CCM required this visit?  No     Plan:     I have personally reviewed and noted the following in the patient's chart:   Medical and social history Use of alcohol, tobacco or illicit drugs  Current medications and supplements including opioid prescriptions. Patient is not currently taking opioid prescriptions. Functional ability and status Nutritional status Physical activity Advanced directives List of other physicians  Hospitalizations, surgeries, and ER visits in previous 12 months Vitals Screenings to include cognitive, depression, and falls Referrals and appointments  In addition, I have reviewed and discussed with patient certain preventive protocols, quality metrics, and best practice recommendations. A written personalized care plan for preventive services as well as general preventive health recommendations were provided to patient.     Marzella Schlein, LPN   [id number]/82/9562   After Visit Summary: (MyChart) Due to this being a telephonic visit, the after visit summary with patients personalized plan was offered to patient via MyChart   Nurse Notes: Pt heart  rate at 55 listen audible and pulse oximetry, Daughter stated she tired at times

## 2023-09-25 ENCOUNTER — Encounter (HOSPITAL_COMMUNITY): Payer: Self-pay

## 2023-09-25 ENCOUNTER — Other Ambulatory Visit: Payer: Self-pay

## 2023-09-25 ENCOUNTER — Emergency Department (HOSPITAL_COMMUNITY): Payer: Medicare Other

## 2023-09-25 ENCOUNTER — Inpatient Hospital Stay (HOSPITAL_COMMUNITY)
Admission: EM | Admit: 2023-09-25 | Discharge: 2023-10-01 | DRG: 481 | Disposition: A | Discharge status: Home / Self Care | Payer: Medicare Other | Attending: Student | Admitting: Student

## 2023-09-25 DIAGNOSIS — N179 Acute kidney failure, unspecified: Secondary | ICD-10-CM | POA: Diagnosis present

## 2023-09-25 DIAGNOSIS — Z79890 Hormone replacement therapy (postmenopausal): Secondary | ICD-10-CM

## 2023-09-25 DIAGNOSIS — Z7401 Bed confinement status: Secondary | ICD-10-CM | POA: Diagnosis not present

## 2023-09-25 DIAGNOSIS — I130 Hypertensive heart and chronic kidney disease with heart failure and stage 1 through stage 4 chronic kidney disease, or unspecified chronic kidney disease: Secondary | ICD-10-CM | POA: Diagnosis present

## 2023-09-25 DIAGNOSIS — I472 Ventricular tachycardia, unspecified: Secondary | ICD-10-CM | POA: Diagnosis not present

## 2023-09-25 DIAGNOSIS — D696 Thrombocytopenia, unspecified: Secondary | ICD-10-CM | POA: Diagnosis not present

## 2023-09-25 DIAGNOSIS — Z7982 Long term (current) use of aspirin: Secondary | ICD-10-CM

## 2023-09-25 DIAGNOSIS — S42432A Displaced fracture (avulsion) of lateral epicondyle of left humerus, initial encounter for closed fracture: Secondary | ICD-10-CM

## 2023-09-25 DIAGNOSIS — E860 Dehydration: Secondary | ICD-10-CM | POA: Diagnosis present

## 2023-09-25 DIAGNOSIS — I10 Essential (primary) hypertension: Secondary | ICD-10-CM | POA: Insufficient documentation

## 2023-09-25 DIAGNOSIS — D649 Anemia, unspecified: Secondary | ICD-10-CM | POA: Diagnosis not present

## 2023-09-25 DIAGNOSIS — I700 Atherosclerosis of aorta: Secondary | ICD-10-CM | POA: Diagnosis present

## 2023-09-25 DIAGNOSIS — S42352A Displaced comminuted fracture of shaft of humerus, left arm, initial encounter for closed fracture: Secondary | ICD-10-CM | POA: Diagnosis not present

## 2023-09-25 DIAGNOSIS — F411 Generalized anxiety disorder: Secondary | ICD-10-CM | POA: Insufficient documentation

## 2023-09-25 DIAGNOSIS — Y92008 Other place in unspecified non-institutional (private) residence as the place of occurrence of the external cause: Secondary | ICD-10-CM | POA: Diagnosis not present

## 2023-09-25 DIAGNOSIS — I509 Heart failure, unspecified: Secondary | ICD-10-CM | POA: Diagnosis not present

## 2023-09-25 DIAGNOSIS — R9082 White matter disease, unspecified: Secondary | ICD-10-CM | POA: Diagnosis not present

## 2023-09-25 DIAGNOSIS — M25452 Effusion, left hip: Secondary | ICD-10-CM | POA: Diagnosis not present

## 2023-09-25 DIAGNOSIS — Z882 Allergy status to sulfonamides status: Secondary | ICD-10-CM

## 2023-09-25 DIAGNOSIS — S72002A Fracture of unspecified part of neck of left femur, initial encounter for closed fracture: Principal | ICD-10-CM

## 2023-09-25 DIAGNOSIS — R451 Restlessness and agitation: Secondary | ICD-10-CM | POA: Diagnosis not present

## 2023-09-25 DIAGNOSIS — I2510 Atherosclerotic heart disease of native coronary artery without angina pectoris: Secondary | ICD-10-CM | POA: Diagnosis present

## 2023-09-25 DIAGNOSIS — W01198A Fall on same level from slipping, tripping and stumbling with subsequent striking against other object, initial encounter: Secondary | ICD-10-CM | POA: Diagnosis present

## 2023-09-25 DIAGNOSIS — E039 Hypothyroidism, unspecified: Secondary | ICD-10-CM | POA: Diagnosis not present

## 2023-09-25 DIAGNOSIS — Z833 Family history of diabetes mellitus: Secondary | ICD-10-CM

## 2023-09-25 DIAGNOSIS — Z8709 Personal history of other diseases of the respiratory system: Secondary | ICD-10-CM | POA: Diagnosis not present

## 2023-09-25 DIAGNOSIS — L821 Other seborrheic keratosis: Secondary | ICD-10-CM | POA: Diagnosis present

## 2023-09-25 DIAGNOSIS — H9190 Unspecified hearing loss, unspecified ear: Secondary | ICD-10-CM | POA: Diagnosis present

## 2023-09-25 DIAGNOSIS — S52122D Displaced fracture of head of left radius, subsequent encounter for closed fracture with routine healing: Secondary | ICD-10-CM | POA: Diagnosis not present

## 2023-09-25 DIAGNOSIS — Z87891 Personal history of nicotine dependence: Secondary | ICD-10-CM | POA: Diagnosis not present

## 2023-09-25 DIAGNOSIS — K219 Gastro-esophageal reflux disease without esophagitis: Secondary | ICD-10-CM | POA: Diagnosis present

## 2023-09-25 DIAGNOSIS — R636 Underweight: Secondary | ICD-10-CM | POA: Diagnosis present

## 2023-09-25 DIAGNOSIS — Z4789 Encounter for other orthopedic aftercare: Secondary | ICD-10-CM | POA: Diagnosis not present

## 2023-09-25 DIAGNOSIS — E875 Hyperkalemia: Secondary | ICD-10-CM | POA: Diagnosis not present

## 2023-09-25 DIAGNOSIS — S72142A Displaced intertrochanteric fracture of left femur, initial encounter for closed fracture: Principal | ICD-10-CM | POA: Diagnosis present

## 2023-09-25 DIAGNOSIS — Z8673 Personal history of transient ischemic attack (TIA), and cerebral infarction without residual deficits: Secondary | ICD-10-CM

## 2023-09-25 DIAGNOSIS — H3530 Unspecified macular degeneration: Secondary | ICD-10-CM | POA: Diagnosis present

## 2023-09-25 DIAGNOSIS — J449 Chronic obstructive pulmonary disease, unspecified: Secondary | ICD-10-CM | POA: Diagnosis present

## 2023-09-25 DIAGNOSIS — Z885 Allergy status to narcotic agent status: Secondary | ICD-10-CM

## 2023-09-25 DIAGNOSIS — Z79899 Other long term (current) drug therapy: Secondary | ICD-10-CM | POA: Diagnosis not present

## 2023-09-25 DIAGNOSIS — I351 Nonrheumatic aortic (valve) insufficiency: Secondary | ICD-10-CM | POA: Diagnosis present

## 2023-09-25 DIAGNOSIS — Z681 Body mass index (BMI) 19 or less, adult: Secondary | ICD-10-CM

## 2023-09-25 DIAGNOSIS — D72829 Elevated white blood cell count, unspecified: Secondary | ICD-10-CM | POA: Diagnosis not present

## 2023-09-25 DIAGNOSIS — E785 Hyperlipidemia, unspecified: Secondary | ICD-10-CM | POA: Diagnosis not present

## 2023-09-25 DIAGNOSIS — Z880 Allergy status to penicillin: Secondary | ICD-10-CM

## 2023-09-25 DIAGNOSIS — M79603 Pain in arm, unspecified: Secondary | ICD-10-CM | POA: Diagnosis not present

## 2023-09-25 DIAGNOSIS — R4182 Altered mental status, unspecified: Secondary | ICD-10-CM | POA: Diagnosis not present

## 2023-09-25 DIAGNOSIS — S79919A Unspecified injury of unspecified hip, initial encounter: Secondary | ICD-10-CM | POA: Diagnosis not present

## 2023-09-25 DIAGNOSIS — S0990XA Unspecified injury of head, initial encounter: Secondary | ICD-10-CM | POA: Diagnosis not present

## 2023-09-25 DIAGNOSIS — S40812A Abrasion of left upper arm, initial encounter: Secondary | ICD-10-CM | POA: Diagnosis present

## 2023-09-25 DIAGNOSIS — S42402A Unspecified fracture of lower end of left humerus, initial encounter for closed fracture: Secondary | ICD-10-CM

## 2023-09-25 DIAGNOSIS — N1831 Chronic kidney disease, stage 3a: Secondary | ICD-10-CM | POA: Diagnosis not present

## 2023-09-25 DIAGNOSIS — Z23 Encounter for immunization: Secondary | ICD-10-CM | POA: Diagnosis not present

## 2023-09-25 DIAGNOSIS — M2540 Effusion, unspecified joint: Secondary | ICD-10-CM | POA: Diagnosis not present

## 2023-09-25 DIAGNOSIS — F40240 Claustrophobia: Secondary | ICD-10-CM

## 2023-09-25 DIAGNOSIS — M25422 Effusion, left elbow: Secondary | ICD-10-CM | POA: Diagnosis not present

## 2023-09-25 DIAGNOSIS — Z7951 Long term (current) use of inhaled steroids: Secondary | ICD-10-CM

## 2023-09-25 DIAGNOSIS — M25532 Pain in left wrist: Secondary | ICD-10-CM | POA: Diagnosis not present

## 2023-09-25 DIAGNOSIS — W19XXXA Unspecified fall, initial encounter: Secondary | ICD-10-CM | POA: Diagnosis present

## 2023-09-25 DIAGNOSIS — E871 Hypo-osmolality and hyponatremia: Secondary | ICD-10-CM | POA: Diagnosis not present

## 2023-09-25 DIAGNOSIS — I959 Hypotension, unspecified: Secondary | ICD-10-CM

## 2023-09-25 DIAGNOSIS — K5730 Diverticulosis of large intestine without perforation or abscess without bleeding: Secondary | ICD-10-CM | POA: Diagnosis present

## 2023-09-25 DIAGNOSIS — R54 Age-related physical debility: Secondary | ICD-10-CM | POA: Diagnosis present

## 2023-09-25 DIAGNOSIS — D62 Acute posthemorrhagic anemia: Secondary | ICD-10-CM | POA: Diagnosis not present

## 2023-09-25 DIAGNOSIS — M25552 Pain in left hip: Secondary | ICD-10-CM | POA: Diagnosis not present

## 2023-09-25 DIAGNOSIS — I9589 Other hypotension: Secondary | ICD-10-CM | POA: Diagnosis present

## 2023-09-25 DIAGNOSIS — S32512A Fracture of superior rim of left pubis, initial encounter for closed fracture: Secondary | ICD-10-CM | POA: Diagnosis not present

## 2023-09-25 DIAGNOSIS — S199XXA Unspecified injury of neck, initial encounter: Secondary | ICD-10-CM | POA: Diagnosis not present

## 2023-09-25 DIAGNOSIS — R600 Localized edema: Secondary | ICD-10-CM | POA: Diagnosis not present

## 2023-09-25 DIAGNOSIS — Z881 Allergy status to other antibiotic agents status: Secondary | ICD-10-CM

## 2023-09-25 LAB — HEMOGLOBIN AND HEMATOCRIT, BLOOD
HCT: 25.5 % — ABNORMAL LOW (ref 36.0–46.0)
Hemoglobin: 7.9 g/dL — ABNORMAL LOW (ref 12.0–15.0)

## 2023-09-25 LAB — BASIC METABOLIC PANEL
Anion gap: 11 (ref 5–15)
BUN: 25 mg/dL — ABNORMAL HIGH (ref 8–23)
CO2: 24 mmol/L (ref 22–32)
Calcium: 8.7 mg/dL — ABNORMAL LOW (ref 8.9–10.3)
Chloride: 104 mmol/L (ref 98–111)
Creatinine, Ser: 1.03 mg/dL — ABNORMAL HIGH (ref 0.44–1.00)
GFR, Estimated: 51 mL/min — ABNORMAL LOW (ref >60)
Glucose, Bld: 125 mg/dL — ABNORMAL HIGH (ref 70–99)
Potassium: 5.6 mmol/L — ABNORMAL HIGH (ref 3.5–5.1)
Sodium: 139 mmol/L (ref 135–145)

## 2023-09-25 LAB — CBC
HCT: 31.6 % — ABNORMAL LOW (ref 36.0–46.0)
Hemoglobin: 10.3 g/dL — ABNORMAL LOW (ref 12.0–15.0)
MCH: 31.5 pg (ref 26.0–34.0)
MCHC: 32.6 g/dL (ref 30.0–36.0)
MCV: 96.6 fL (ref 80.0–100.0)
Platelets: 169 10*3/uL (ref 150–400)
RBC: 3.27 MIL/uL — ABNORMAL LOW (ref 3.87–5.11)
RDW: 15.7 % — ABNORMAL HIGH (ref 11.5–15.5)
WBC: 14.9 10*3/uL — ABNORMAL HIGH (ref 4.0–10.5)
nRBC: 0 % (ref 0.0–0.2)

## 2023-09-25 LAB — CBG MONITORING, ED: Glucose-Capillary: 180 mg/dL — ABNORMAL HIGH (ref 70–99)

## 2023-09-25 LAB — HEPATIC FUNCTION PANEL
ALT: 15 U/L (ref 0–44)
AST: 29 U/L (ref 15–41)
Albumin: 3.4 g/dL — ABNORMAL LOW (ref 3.5–5.0)
Alkaline Phosphatase: 59 U/L (ref 38–126)
Bilirubin, Direct: 0.3 mg/dL — ABNORMAL HIGH (ref 0.0–0.2)
Indirect Bilirubin: 0.8 mg/dL (ref 0.3–0.9)
Total Bilirubin: 1.1 mg/dL (ref 0.3–1.2)
Total Protein: 6.2 g/dL — ABNORMAL LOW (ref 6.5–8.1)

## 2023-09-25 LAB — ABO/RH: ABO/RH(D): O POS

## 2023-09-25 LAB — PREPARE RBC (CROSSMATCH)

## 2023-09-25 LAB — CK: Total CK: 88 U/L (ref 38–234)

## 2023-09-25 MED ORDER — MIDODRINE HCL 5 MG PO TABS: 10.0000 mg | ORAL_TABLET | Freq: Three times a day (TID) | ORAL | Status: DC

## 2023-09-25 MED ORDER — FERROUS SULFATE 325 (65 FE) MG PO TABS
325.0000 mg | ORAL_TABLET | Freq: Every day | ORAL | Status: DC
  Administered 2023-09-26 – 2023-10-01 (×6): 325 mg via ORAL
  Filled 2023-09-25 (×6): qty 1

## 2023-09-25 MED ORDER — ONDANSETRON HCL 4 MG/2ML IJ SOLN: 4.0000 mg | Freq: Four times a day (QID) | INTRAMUSCULAR | Status: DC | PRN

## 2023-09-25 MED ORDER — LEVOTHYROXINE SODIUM 112 MCG PO TABS
112.0000 ug | ORAL_TABLET | Freq: Every day | ORAL | Status: DC
  Administered 2023-09-27 – 2023-10-01 (×5): 112 ug via ORAL
  Filled 2023-09-25 (×5): qty 1

## 2023-09-25 MED ORDER — SODIUM CHLORIDE 0.9% FLUSH
3.0000 mL | Freq: Two times a day (BID) | INTRAVENOUS | Status: DC
  Administered 2023-09-26 – 2023-09-27 (×5): 3 mL via INTRAVENOUS

## 2023-09-25 MED ORDER — IPRATROPIUM-ALBUTEROL 0.5-2.5 (3) MG/3ML IN SOLN: 3.0000 mL | RESPIRATORY_TRACT | Status: DC | PRN

## 2023-09-25 MED ORDER — LEVOTHYROXINE SODIUM 112 MCG PO TABS: 112.0000 ug | ORAL_TABLET | Freq: Every day | ORAL | Status: DC

## 2023-09-25 MED ORDER — ONDANSETRON HCL 4 MG PO TABS
4.0000 mg | ORAL_TABLET | Freq: Four times a day (QID) | ORAL | Status: DC | PRN
Start: 2023-09-25 — End: 2023-09-26

## 2023-09-25 MED ORDER — ONDANSETRON HCL 4 MG/2ML IJ SOLN
4.0000 mg | Freq: Once | INTRAMUSCULAR | Status: AC
  Administered 2023-09-25: 4 mg via INTRAVENOUS
  Filled 2023-09-25: qty 2

## 2023-09-25 MED ORDER — NALOXONE HCL 4 MG/0.1ML NA LIQD
NASAL | Status: AC
  Filled 2023-09-25: qty 4

## 2023-09-25 MED ORDER — CALCIUM GLUCONATE-NACL 1-0.675 GM/50ML-% IV SOLN
1.0000 g | INTRAVENOUS | Status: AC
  Administered 2023-09-26: 1000 mg via INTRAVENOUS
  Filled 2023-09-25: qty 50

## 2023-09-25 MED ORDER — ACETAMINOPHEN 10 MG/ML IV SOLN
1000.0000 mg | Freq: Four times a day (QID) | INTRAVENOUS | Status: DC
  Administered 2023-09-25: 1000 mg via INTRAVENOUS
  Filled 2023-09-25 (×3): qty 100

## 2023-09-25 MED ORDER — ACETAMINOPHEN NICU IV SYRINGE 10 MG/ML: 15.0000 mg/kg | Freq: Four times a day (QID) | INTRAVENOUS | Status: DC

## 2023-09-25 MED ORDER — SODIUM CHLORIDE 0.9 % IV SOLN: 250.0000 mL | INTRAVENOUS | Status: AC | PRN

## 2023-09-25 MED ORDER — SODIUM CHLORIDE 0.9% FLUSH: 3.0000 mL | INTRAVENOUS | Status: DC | PRN

## 2023-09-25 MED ORDER — ALBUMIN HUMAN 25 % IV SOLN
50.0000 g | INTRAVENOUS | Status: DC
  Filled 2023-09-25: qty 200

## 2023-09-25 MED ORDER — ENSURE PRE-SURGERY PO LIQD
296.0000 mL | Freq: Once | ORAL | Status: AC
  Administered 2023-09-25: 296 mL via ORAL
  Filled 2023-09-25: qty 296

## 2023-09-25 MED ORDER — ATORVASTATIN CALCIUM 40 MG PO TABS
40.0000 mg | ORAL_TABLET | Freq: Every day | ORAL | Status: DC
  Administered 2023-09-27 – 2023-10-01 (×5): 40 mg via ORAL
  Filled 2023-09-25 (×5): qty 1

## 2023-09-25 MED ORDER — ACETAMINOPHEN 10 MG/ML IV SOLN: 1000.0000 mg | Freq: Three times a day (TID) | INTRAVENOUS | Status: DC

## 2023-09-25 MED ORDER — SODIUM CHLORIDE 0.9 % IV BOLUS
1000.0000 mL | Freq: Once | INTRAVENOUS | Status: AC
  Administered 2023-09-25: 1000 mL via INTRAVENOUS

## 2023-09-25 MED ORDER — HEPARIN SODIUM (PORCINE) 5000 UNIT/ML IJ SOLN: 5000.0000 [IU] | Freq: Three times a day (TID) | INTRAMUSCULAR | Status: DC

## 2023-09-25 MED ORDER — ACETAMINOPHEN 10 MG/ML IV SOLN
500.0000 mg | Freq: Four times a day (QID) | INTRAVENOUS | Status: AC
  Administered 2023-09-26 (×2): 1000 mg via INTRAVENOUS
  Administered 2023-09-27: 500 mg via INTRAVENOUS
  Filled 2023-09-25 (×2): qty 50

## 2023-09-25 MED ORDER — SODIUM CHLORIDE 0.9 % IV SOLN: INTRAVENOUS | Status: AC

## 2023-09-25 MED ORDER — SODIUM ZIRCONIUM CYCLOSILICATE 10 G PO PACK
10.0000 g | PACK | Freq: Once | ORAL | Status: AC
  Administered 2023-09-25: 10 g via ORAL
  Filled 2023-09-25: qty 1

## 2023-09-25 MED ORDER — ATORVASTATIN CALCIUM 40 MG PO TABS: 40.0000 mg | ORAL_TABLET | Freq: Every day | ORAL | Status: DC

## 2023-09-25 MED ORDER — NALOXONE HCL 0.4 MG/ML IJ SOLN: 0.4000 mg | Freq: Once | INTRAMUSCULAR | Status: DC

## 2023-09-25 MED ORDER — SODIUM CHLORIDE 0.9 % IV BOLUS
1000.0000 mL | Freq: Once | INTRAVENOUS | Status: AC
Start: 2023-09-25 — End: 2023-09-25
  Administered 2023-09-25: 1000 mL via INTRAVENOUS

## 2023-09-25 MED ORDER — SODIUM CHLORIDE 0.9 % IV BOLUS
1000.0000 mL | INTRAVENOUS | Status: AC
  Administered 2023-09-25: 1000 mL via INTRAVENOUS

## 2023-09-25 MED ORDER — MIDODRINE HCL 5 MG PO TABS
10.0000 mg | ORAL_TABLET | Freq: Once | ORAL | Status: AC
  Administered 2023-09-25: 10 mg via ORAL
  Filled 2023-09-25: qty 2

## 2023-09-25 MED ORDER — MOMETASONE FURO-FORMOTEROL FUM 200-5 MCG/ACT IN AERO
2.0000 | INHALATION_SPRAY | Freq: Two times a day (BID) | RESPIRATORY_TRACT | Status: DC
  Administered 2023-09-26 – 2023-10-01 (×10): 2 via RESPIRATORY_TRACT
  Filled 2023-09-25: qty 8.8

## 2023-09-25 MED ORDER — MORPHINE SULFATE (PF) 2 MG/ML IV SOLN
2.0000 mg | INTRAVENOUS | Status: DC | PRN
  Administered 2023-09-25: 2 mg via INTRAVENOUS
  Filled 2023-09-25: qty 1

## 2023-09-25 MED ORDER — MORPHINE SULFATE (PF) 2 MG/ML IV SOLN
0.5000 mg | INTRAVENOUS | Status: DC | PRN
  Administered 2023-09-26: 1 mg via INTRAVENOUS
  Administered 2023-09-28: 0.5 mg via INTRAVENOUS
  Administered 2023-09-30 (×2): 1 mg via INTRAVENOUS
  Filled 2023-09-25 (×4): qty 1

## 2023-09-25 MED ORDER — MIDODRINE HCL 5 MG PO TABS
10.0000 mg | ORAL_TABLET | Freq: Three times a day (TID) | ORAL | Status: DC
  Administered 2023-09-26 – 2023-09-27 (×4): 10 mg via ORAL
  Filled 2023-09-25 (×4): qty 2

## 2023-09-25 MED ORDER — DEXTROSE 50 % IV SOLN: 1.0000 | INTRAVENOUS | Status: DC | PRN

## 2023-09-25 MED ORDER — TETANUS-DIPHTH-ACELL PERTUSSIS 5-2.5-18.5 LF-MCG/0.5 IM SUSY
0.5000 mL | PREFILLED_SYRINGE | Freq: Once | INTRAMUSCULAR | Status: AC
  Administered 2023-09-25: 0.5 mL via INTRAMUSCULAR
  Filled 2023-09-25: qty 0.5

## 2023-09-25 MED ORDER — SODIUM CHLORIDE 0.9% IV SOLUTION: Freq: Once | INTRAVENOUS | Status: DC

## 2023-09-25 MED ORDER — BISACODYL 5 MG PO TBEC: 5.0000 mg | DELAYED_RELEASE_TABLET | Freq: Every day | ORAL | Status: DC | PRN

## 2023-09-25 NOTE — Significant Event (Addendum)
TRH night coverage note:  Pt seen at bedside.  BP improved to 105 systolic after 2.5 L given of 3L following drop into the 70s-80s earlier.  IV infiltrated, new IV obtained by line team.  Will hold off on the remaining 0.5L + albumin for the moment since BP has improved currently, pt in no distress, comfortable.  Also would rather avoid volume overload (pt is 89 years old and we may still have to give PRBC transfusions depending if H/H drop is real or not).  Repeat H/H pending to see if we need to do said PRBC transfusions.  Will re-order morphine at 0.5-1mg  q2h PRN if needed later tonight (since seems they thought 2mg  might be a bit too much after they gave in ED?)  Not clear that morphine caused BP drop though.  Update: transfusing 2u PRBC for HGB 7.1 on repeat.

## 2023-09-25 NOTE — ED Triage Notes (Signed)
Per EMS and pt report, pt missed the last step on her front porch. She landed on the pavement. She is now reporting L hip pain, L head pain, and L arm pain. Denies LOC. Denies blood thinners. L arm abrasion with dressing from EMS.  EMS reports left leg shortening and rotation.

## 2023-09-25 NOTE — ED Notes (Signed)
Pt back to ct

## 2023-09-25 NOTE — ED Notes (Signed)
Another  call to [phone] on the way up  blood for type and screen and a h an h

## 2023-09-25 NOTE — Progress Notes (Signed)
Orthopedic Tech Progress Note Patient Details:  Holly [name] [birth year] [id number]  Well-padded posterior long arm splint applied to LUE in best obtainable position. Motion and sensation of digits remain intact.  Ortho Devices Type of Ortho Device: Post (long arm) splint Ortho Device/Splint Location: LUE Ortho Device/Splint Interventions: Ordered, Application, Adjustment   Post Interventions Patient Tolerated: Well Instructions Provided: Care of device, Adjustment of device  Shalla Bulluck Carmine Savoy 09/25/2023, 6:58 PM

## 2023-09-25 NOTE — ED Notes (Signed)
The pt returned from c-t  1 liter fluid infused-

## 2023-09-25 NOTE — ED Provider Notes (Signed)
[place] EMERGENCY DEPARTMENT AT [facility] Provider Note   CSN: [id number] Arrival date & time: 09/25/23  1526     History  Chief Complaint  Patient presents with   Leg Injury   Fall    Holly [name] is a 89 y.o. female.  89 year old female with past medical history of COPD, CHF, and CVA presenting to the emergency department today with left hip pain and left elbow pain after a mechanical fall.  The patient states that she tripped and fell a few hours prior to arrival.  She states that she did hit her head but did not lose consciousness.  She is having a lot of pain in her left hip and left elbow since then.  This was not a syncopal episode.  She states that prior to this that she is ambulatory without any difficulty.  She is not on any blood thinners.  She came to ER today for the evaluation.   Fall       Home Medications Prior to Admission medications   Medication Sig Start Date End Date Taking? Authorizing Provider  acetaminophen (TYLENOL) 500 MG tablet Take 500 mg by mouth every 6 (six) hours as needed for mild pain.     [provider]  albuterol (VENTOLIN HFA) 108 (90 Base) MCG/ACT inhaler INHALE 1-2 PUFFS BY MOUTH EVERY 6 HOURS AS NEEDED FOR WHEEZE OR SHORTNESS OF BREATH 07/11/23   Ardith Dark, MD  aspirin EC 81 MG tablet Take 1 tablet (81 mg total) by mouth daily. Swallow whole. 02/11/21   Ihor Austin, NP  atorvastatin (LIPITOR) 40 MG tablet Take 1 tablet (40 mg total) by mouth daily. 01/18/23   Ardith Dark, MD  budesonide-formoterol [facility]) 160-4.5 MCG/ACT inhaler Inhale 2 puffs into the lungs 2 (two) times daily. 04/15/23   Ardith Dark, MD  levothyroxine (SYNTHROID) 112 MCG tablet TAKE 1 TABLET BY MOUTH EVERY DAY 07/27/23   Ardith Dark, MD  LORazepam (ATIVAN) 0.5 MG tablet 1-2 tabs 30 - 60 min prior to MRI. Do not drive with this medicine. Patient not taking: Reported on 08/15/2023 04/14/23   Richardean Sale, DO  Multiple  Vitamins-Minerals (ICAPS PO) Take 1 capsule by mouth 2 (two) times daily.    [provider]  Spacer/Aero-Holding Chambers (AEROCHAMBER PLUS FLO-VU MEDIUM) MISC Use daily as needed Patient not taking: Reported on 08/15/2023 04/15/23   Ardith Dark, MD      Allergies    Levofloxacin, Macrobid [nitrofurantoin macrocrystal], Sulfamethoxazole, Tramadol, and Amoxicillin    Review of Systems   Review of Systems  Musculoskeletal:  Positive for arthralgias.  All other systems reviewed and are negative.   Physical Exam Updated Vital Signs BP (!) 94/42   Pulse 72   Temp 97.6 F (36.4 C) (Oral)   Resp (!) 22   Ht 5\' 2"  (1.575 m)   Wt 36.3 kg   SpO2 (!) 89%   BMI 14.63 kg/m  Physical Exam Vitals and nursing note reviewed.   Gen: NAD Eyes: PERRL, EOMI HEENT: no oropharyngeal swelling Neck: trachea midline, no midline tenderness Resp: clear to auscultation bilaterally Card: RRR, no murmurs, rubs, or gallops Abd: nontender, nondistended Extremities: The patient is tender over the left lateral aspect of her elbow with normal range of motion.  There is a skin tear noted just proximal to this with no active bleeding.  The patient has normal range of motion and no significant tenderness over the shoulder or wrist/hand on the  left.  The right upper extremities atraumatic as well as the right lower extremity.  The patient's left hip is shortened and externally rotated.  She is tender over the proximal hip. Neuro: The patient has equal strength and sensation throughout the bilateral upper extremities.  She does have normal strength and sensation with the right lower extremity with ankle flexion extension and knee flexion extension.  Hip flexion extension is not tried due to pain. Vascular: 2+ radial pulses bilaterally, 2+ DP pulses bilaterally Skin: no rashes Psyc: acting appropriately   ED Results / Procedures / Treatments   Labs (all labs ordered are listed, but only abnormal  results are displayed) Labs Reviewed  BASIC METABOLIC PANEL - Abnormal; Notable for the following components:      Result Value   Potassium 5.6 (*)    Glucose, Bld 125 (*)    BUN 25 (*)    Creatinine, Ser 1.03 (*)    Calcium 8.7 (*)    GFR, Estimated 51 (*)    All other components within normal limits  CBC - Abnormal; Notable for the following components:   WBC 14.9 (*)    RBC 3.27 (*)    Hemoglobin 10.3 (*)    HCT 31.6 (*)    RDW 15.7 (*)    All other components within normal limits  HEMOGLOBIN AND HEMATOCRIT, BLOOD    EKG None  Radiology CT ELBOW LEFT WO CONTRAST  Result Date: 09/25/2023 CLINICAL DATA:  Left elbow fracture. EXAM: CT OF THE UPPER LEFT EXTREMITY WITHOUT CONTRAST TECHNIQUE: Multidetector CT imaging of the upper left extremity was performed according to the standard protocol. RADIATION DOSE REDUCTION: This exam was performed according to the departmental dose-optimization program which includes automated exposure control, adjustment of the mA and/or kV according to patient size and/or use of iterative reconstruction technique. COMPARISON:  Radiograph of the left elbow from earlier today. FINDINGS: Bones/Joint/Cartilage There is an acute, mildly comminuted, intra-articular fracture deformity involving the lateral humeral epicondyle. Slight impaction of the fracture fragments identified. Chronic, slightly impacted fracture deformity involving the lateral aspect of the radial head is again noted in appears similar when compared with the plain film radiographs from 08/06/2022. Ligaments Suboptimally assessed by CT. Muscles and Tendons No mass identified. Soft tissues Small joint effusion. Soft tissue edema and hematoma identified overlying the lateral epicondyle of the distal humerus. IMPRESSION: 1. Acute, mildly comminuted, intra-articular fracture deformity involving the lateral humeral epicondyle. 2. Chronic, slightly impacted fracture deformity involving the lateral aspect  of the radial head is again noted in appears similar when compared with the plain film radiographs from 08/06/2022. 3. Small joint effusion. 4. Soft tissue edema and hematoma identified overlying the lateral epicondyle of the distal humerus. Electronically Signed   By: Signa Kell M.D.   On: 09/25/2023 18:43   CT Head Wo Contrast  Result Date: 09/25/2023 CLINICAL DATA:  Fall, head and neck trauma EXAM: CT HEAD WITHOUT CONTRAST CT CERVICAL SPINE WITHOUT CONTRAST TECHNIQUE: Multidetector CT imaging of the head and cervical spine was performed following the standard protocol without intravenous contrast. Multiplanar CT image reconstructions of the cervical spine were also generated. RADIATION DOSE REDUCTION: This exam was performed according to the departmental dose-optimization program which includes automated exposure control, adjustment of the mA and/or kV according to patient size and/or use of iterative reconstruction technique. COMPARISON:  MR brain, 04/16/2022 FINDINGS: CT HEAD FINDINGS Brain: No evidence of acute infarction, hemorrhage, hydrocephalus, extra-axial collection or mass lesion/mass effect. Periventricular and deep white  matter hypodensity. Vascular: No hyperdense vessel or unexpected calcification. Skull: Normal. Negative for fracture or focal lesion. Sinuses/Orbits: No acute finding. Other: None. CT CERVICAL SPINE FINDINGS Alignment: Normal. Skull base and vertebrae: No acute fracture. No primary bone lesion or focal pathologic process. Soft tissues and spinal canal: No prevertebral fluid or swelling. No visible canal hematoma. Disc levels: Mild multilevel disc space height loss and osteophytosis throughout the cervical spine. Upper chest: Negative. Other: None. IMPRESSION: 1. No acute intracranial pathology. Small-vessel white matter disease. 2. No fracture or static subluxation of the cervical spine. 3. Mild multilevel cervical disc degenerative disease. Electronically Signed   By: Jearld Lesch M.D.   On: 09/25/2023 17:16   CT Cervical Spine Wo Contrast  Result Date: 09/25/2023 CLINICAL DATA:  Fall, head and neck trauma EXAM: CT HEAD WITHOUT CONTRAST CT CERVICAL SPINE WITHOUT CONTRAST TECHNIQUE: Multidetector CT imaging of the head and cervical spine was performed following the standard protocol without intravenous contrast. Multiplanar CT image reconstructions of the cervical spine were also generated. RADIATION DOSE REDUCTION: This exam was performed according to the departmental dose-optimization program which includes automated exposure control, adjustment of the mA and/or kV according to patient size and/or use of iterative reconstruction technique. COMPARISON:  MR brain, 04/16/2022 FINDINGS: CT HEAD FINDINGS Brain: No evidence of acute infarction, hemorrhage, hydrocephalus, extra-axial collection or mass lesion/mass effect. Periventricular and deep white matter hypodensity. Vascular: No hyperdense vessel or unexpected calcification. Skull: Normal. Negative for fracture or focal lesion. Sinuses/Orbits: No acute finding. Other: None. CT CERVICAL SPINE FINDINGS Alignment: Normal. Skull base and vertebrae: No acute fracture. No primary bone lesion or focal pathologic process. Soft tissues and spinal canal: No prevertebral fluid or swelling. No visible canal hematoma. Disc levels: Mild multilevel disc space height loss and osteophytosis throughout the cervical spine. Upper chest: Negative. Other: None. IMPRESSION: 1. No acute intracranial pathology. Small-vessel white matter disease. 2. No fracture or static subluxation of the cervical spine. 3. Mild multilevel cervical disc degenerative disease. Electronically Signed   By: Jearld Lesch M.D.   On: 09/25/2023 17:16   DG HIP UNILAT WITH PELVIS 2-3 VIEWS LEFT  Result Date: 09/25/2023 CLINICAL DATA:  Larey Seat, left hip pain EXAM: DG HIP (WITH OR WITHOUT PELVIS) 2-3V LEFT COMPARISON:  None Available. FINDINGS: Frontal view of the pelvis as well  as frontal and frogleg lateral views of the left hip are obtained. There is a comminuted intertrochanteric left hip fracture, with impaction and varus angulation at the fracture site. No dislocation. Symmetrical bilateral hip osteoarthritis. The remainder of the bony pelvis is unremarkable. IMPRESSION: Upper 1 comminuted intertrochanteric left hip fracture, with impaction and varus angulation. Electronically Signed   By: Sharlet Salina M.D.   On: 09/25/2023 16:51   DG Chest Portable 1 View  Result Date: 09/25/2023 CLINICAL DATA:  Larey Seat, left hip and arm pain EXAM: PORTABLE CHEST 1 VIEW COMPARISON:  02/05/2021 FINDINGS: Single frontal view of the chest demonstrates a stable cardiac silhouette. Continued ectasia and atherosclerosis of the thoracic aorta. No acute airspace disease, effusion, or pneumothorax. No acute bony abnormality. IMPRESSION: 1. No acute intrathoracic process. Electronically Signed   By: Sharlet Salina M.D.   On: 09/25/2023 16:51   DG Elbow Complete Left  Result Date: 09/25/2023 CLINICAL DATA:  Larey Seat downstairs, left arm pain EXAM: LEFT ELBOW - COMPLETE 3+ VIEW COMPARISON:  08/06/2022 FINDINGS: Frontal, bilateral oblique, and lateral views of the left elbow are obtained. Evaluation is limited due to suboptimal positioning. There is an  acute intra-articular fracture involving the lateral humeral epicondyle, best seen on the oblique projections. Chronic healed radial head fracture is unchanged since prior exam. No other acute bony abnormalities. Alignment appears anatomic. Elevation of the anterior and posterior fat pads consistent with joint effusion. Soft tissue swelling of the elbow and proximal forearm. IMPRESSION: 1. Acute minimally displaced intra-articular lateral humeral epicondyle fracture, with associated joint effusion. 2. Chronic healed radial head fracture unchanged. 3. Soft tissue swelling. Electronically Signed   By: Sharlet Salina M.D.   On: 09/25/2023 16:50     Procedures Procedures    Medications Ordered in ED Medications  acetaminophen (OFIRMEV) IV 1,000 mg (0 mg Intravenous Stopped 09/25/23 1746)  morphine (PF) 2 MG/ML injection 2 mg (2 mg Intravenous Given 09/25/23 1744)  naloxone [facility]) injection 0.4 mg (0.4 mg Intravenous Not Given 09/25/23 1905)  Tdap (BOOSTRIX) injection 0.5 mL (0.5 mLs Intramuscular Given 09/25/23 1730)  ondansetron (ZOFRAN) injection 4 mg (4 mg Intravenous Given 09/25/23 1726)  sodium chloride 0.9 % bolus 1,000 mL (0 mLs Intravenous Stopped 09/25/23 1831)  feeding supplement (ENSURE PRE-SURGERY) liquid 296 mL (296 mLs Oral Given 09/25/23 1823)  sodium chloride 0.9 % bolus 1,000 mL (0 mLs Intravenous Stopped 09/25/23 2018)  naloxone (NARCAN) 4 MG/0.1ML nasal spray kit ( Nasal Provided for home use 09/25/23 1904)    ED Course/ Medical Decision Making/ A&P                                 Medical Decision Making 89 year old female with past medical history of COPD, CHF, and CVA presenting to the emergency department today with concern for hip fracture.  I will obtain x-ray here to evaluate for this or dislocation.  Also obtain an x-ray of her left elbow as well as a CT scan of her head and cervical spine to evaluate for acute traumatic injuries.  I will also obtain formal chest x-ray for preoperative evaluation.  I will give the patient IV Tylenol for pain initially.  If she is still having pain after I will give her morphine.  Will obtain basic labs expect she will require admission.  I will also give her a Tdap here.  Will reevaluate for ultimate disposition.  The patient does appear to have a hip fracture as well as a fracture of her left elbow.  I did discuss her case with Dr. Ave Filter.  He did order a CT scan of her elbow.  The patient is placed in a posterior splint after her skin tears were dressed.  Dr. Ave Filter recommends having the patient n.p.o. after midnight for surgery tomorrow.  After I did speak with him  the patient did have a drop in her blood pressures.  They were in the 60s and 70s systolic.  She is given IV fluids.  She had received from some morphine so she was given Narcan which did not seem to help with this.  Her blood pressures did improve with IV fluids.  I did order a CT scan of her pelvis and left hip for further evaluation for possible bleeding but I do not appreciate any large fluid collections on my read.  With her blood pressures improving a call was placed to the hospitalist service for admission.  She does have a leukocytosis here which I suspect is reactive.  I did speak with the patient as well as her daughter and she was in her normal state of health  before she fell today so I do not think that she is septic at this time.  Her daughter states that her normal blood pressures are in the 90 systolic.  Critical care time 36 minutes including reassessments, review of old records, treatment of hypotension with IV fluids, coordination care with orthopedics, coordination care with hospitalist service  Amount and/or Complexity of Data Reviewed Labs: ordered. Radiology: ordered.  Risk Prescription drug management.           Final Clinical Impression(s) / ED Diagnoses Final diagnoses:  Closed fracture of left hip, initial encounter (HCC)  Left elbow fracture, closed, initial encounter  Hypotension, unspecified hypotension type    Rx / DC Orders ED Discharge Orders     None         Durwin Glaze, MD 09/25/23 2022

## 2023-09-25 NOTE — ED Notes (Signed)
The nurse  Sharolyn Douglas has accepted the pt  will taker her up as soon as the iv team nurse has  finished

## 2023-09-25 NOTE — Consult Note (Signed)
Reason for Consult:left hip and elbow fracture Referring Physician: ED  Holly [name] is an 89 y.o. female.  HPI: 89 year old female with past medical history of COPD, CHF, and CVA presenting to the emergency department today with left hip pain and left elbow pain after a mechanical fall.  The patient states that she tripped and fell a few hours prior to arrival.  She states that she did hit her head but did not lose consciousness.  She is having a lot of pain in her left hip and left elbow since then.  This was not a syncopal episode.  She states that prior to this that she is ambulatory without any difficulty.  She is not on any blood thinners.  She came to ER today for the evaluation.    Past Medical History:  Diagnosis Date   CAD (coronary artery disease)    CELLULITIS 06/18/2008   CHF (congestive heart failure) (HCC)    CHRONIC OBSTRUCTIVE PULMONARY DISEASE, ACUTE EXACERBATION 11/16/2007   COPD 05/18/2007   COVID-19    GERD 12/25/2007   HYPERLIPIDEMIA 05/18/2007   HYPOTHYROIDISM 05/18/2007   Macular degeneration    Stroke (HCC) 08/2018   Urosepsis     Past Surgical History:  Procedure Laterality Date   CATARACT EXTRACTION     DILATION AND CURETTAGE OF UTERUS     KYPHOPLASTY N/A 05/02/2020   Procedure: Thoracic eleven KYPHOPLASTY;  Surgeon: Coletta Memos, MD;  Location: [facility] OR;  Service: Neurosurgery;  Laterality: N/A;  3C   TEE WITHOUT CARDIOVERSION N/A 09/01/2018   Procedure: TRANSESOPHAGEAL ECHOCARDIOGRAM (TEE);  Surgeon: Jodelle Red, MD;  Location: [facility] ENDOSCOPY;  Service: Cardiovascular;  Laterality: N/A;   VARICOSE VEIN SURGERY      Family History  Problem Relation Age of Onset   Ovarian cancer Mother    Diabetes Mellitus II Brother     Social History:  reports that she quit smoking about 37 years ago. Her smoking use included cigarettes. She started smoking about 77 years ago. She has never used smokeless tobacco. She reports that she does not drink alcohol and  does not use drugs.  Allergies:  Allergies  Allergen Reactions   Levofloxacin     REACTION: unspecified   Macrobid [Nitrofurantoin Macrocrystal] Nausea Only   Sulfamethoxazole     REACTION: unspecified   Tramadol Other (See Comments)    Shakes    Amoxicillin Rash    Has patient had a PCN reaction causing immediate rash, facial/tongue/throat swelling, SOB or lightheadedness with hypotension: No Has patient had a PCN reaction causing severe rash involving mucus membranes or skin necrosis: No Has patient had a PCN reaction that required hospitalization: No Has patient had a PCN reaction occurring within the last 10 years: No If all of the above answers are "NO", then may proceed with Cephalosporin use.   Made "bottom area" very raw    Medications: I have reviewed the patient's current medications.  Results for orders placed or performed during the hospital encounter of 09/25/23 (from the past 48 hour(s))  Basic metabolic panel     Status: Abnormal   Collection Time: 09/25/23  4:30 PM  Result Value Ref Range   Sodium 139 135 - 145 mmol/L   Potassium 5.6 (H) 3.5 - 5.1 mmol/L    Comment: HEMOLYSIS AT THIS LEVEL MAY AFFECT RESULT   Chloride 104 98 - 111 mmol/L   CO2 24 22 - 32 mmol/L   Glucose, Bld 125 (H) 70 - 99 mg/dL  Comment: Glucose reference range applies only to samples taken after fasting for at least 8 hours.   BUN 25 (H) 8 - 23 mg/dL   Creatinine, Ser 1.91 (H) 0.44 - 1.00 mg/dL   Calcium 8.7 (L) 8.9 - 10.3 mg/dL   GFR, Estimated 51 (L) >60 mL/min    Comment: (NOTE) Calculated using the CKD-EPI Creatinine Equation (2021)    Anion gap 11 5 - 15    Comment: Performed at [facility], [address], [place], [place] [address]  CBC     Status: Abnormal   Collection Time: 09/25/23  5:21 PM  Result Value Ref Range   WBC 14.9 (H) 4.0 - 10.5 K/uL   RBC 3.27 (L) 3.87 - 5.11 MIL/uL   Hemoglobin 10.3 (L) 12.0 - 15.0 g/dL   HCT 56.2 (L) 13.0 - 86.5 %   MCV 96.6  80.0 - 100.0 fL   MCH 31.5 26.0 - 34.0 pg   MCHC 32.6 30.0 - 36.0 g/dL   RDW 78.4 (H) 69.6 - 29.5 %   Platelets 169 150 - 400 K/uL   nRBC 0.0 0.0 - 0.2 %    Comment: Performed at [facility], [address] [place], [place] [address]    CT Head Wo Contrast  Result Date: 09/25/2023 CLINICAL DATA:  Fall, head and neck trauma EXAM: CT HEAD WITHOUT CONTRAST CT CERVICAL SPINE WITHOUT CONTRAST TECHNIQUE: Multidetector CT imaging of the head and cervical spine was performed following the standard protocol without intravenous contrast. Multiplanar CT image reconstructions of the cervical spine were also generated. RADIATION DOSE REDUCTION: This exam was performed according to the departmental dose-optimization program which includes automated exposure control, adjustment of the mA and/or kV according to patient size and/or use of iterative reconstruction technique. COMPARISON:  MR brain, 04/16/2022 FINDINGS: CT HEAD FINDINGS Brain: No evidence of acute infarction, hemorrhage, hydrocephalus, extra-axial collection or mass lesion/mass effect. Periventricular and deep white matter hypodensity. Vascular: No hyperdense vessel or unexpected calcification. Skull: Normal. Negative for fracture or focal lesion. Sinuses/Orbits: No acute finding. Other: None. CT CERVICAL SPINE FINDINGS Alignment: Normal. Skull base and vertebrae: No acute fracture. No primary bone lesion or focal pathologic process. Soft tissues and spinal canal: No prevertebral fluid or swelling. No visible canal hematoma. Disc levels: Mild multilevel disc space height loss and osteophytosis throughout the cervical spine. Upper chest: Negative. Other: None. IMPRESSION: 1. No acute intracranial pathology. Small-vessel white matter disease. 2. No fracture or static subluxation of the cervical spine. 3. Mild multilevel cervical disc degenerative disease. Electronically Signed   By: Jearld Lesch M.D.   On: 09/25/2023 17:16   CT Cervical Spine Wo  Contrast  Result Date: 09/25/2023 CLINICAL DATA:  Fall, head and neck trauma EXAM: CT HEAD WITHOUT CONTRAST CT CERVICAL SPINE WITHOUT CONTRAST TECHNIQUE: Multidetector CT imaging of the head and cervical spine was performed following the standard protocol without intravenous contrast. Multiplanar CT image reconstructions of the cervical spine were also generated. RADIATION DOSE REDUCTION: This exam was performed according to the departmental dose-optimization program which includes automated exposure control, adjustment of the mA and/or kV according to patient size and/or use of iterative reconstruction technique. COMPARISON:  MR brain, 04/16/2022 FINDINGS: CT HEAD FINDINGS Brain: No evidence of acute infarction, hemorrhage, hydrocephalus, extra-axial collection or mass lesion/mass effect. Periventricular and deep white matter hypodensity. Vascular: No hyperdense vessel or unexpected calcification. Skull: Normal. Negative for fracture or focal lesion. Sinuses/Orbits: No acute finding. Other: None. CT CERVICAL SPINE FINDINGS  Alignment: Normal. Skull base and vertebrae: No acute fracture. No primary bone lesion or focal pathologic process. Soft tissues and spinal canal: No prevertebral fluid or swelling. No visible canal hematoma. Disc levels: Mild multilevel disc space height loss and osteophytosis throughout the cervical spine. Upper chest: Negative. Other: None. IMPRESSION: 1. No acute intracranial pathology. Small-vessel white matter disease. 2. No fracture or static subluxation of the cervical spine. 3. Mild multilevel cervical disc degenerative disease. Electronically Signed   By: Jearld Lesch M.D.   On: 09/25/2023 17:16   DG HIP UNILAT WITH PELVIS 2-3 VIEWS LEFT  Result Date: 09/25/2023 CLINICAL DATA:  Holly [name], left hip pain EXAM: DG HIP (WITH OR WITHOUT PELVIS) 2-3V LEFT COMPARISON:  None Available. FINDINGS: Frontal view of the pelvis as well as frontal and frogleg lateral views of the left hip are  obtained. There is a comminuted intertrochanteric left hip fracture, with impaction and varus angulation at the fracture site. No dislocation. Symmetrical bilateral hip osteoarthritis. The remainder of the bony pelvis is unremarkable. IMPRESSION: Upper 1 comminuted intertrochanteric left hip fracture, with impaction and varus angulation. Electronically Signed   By: Sharlet Salina M.D.   On: 09/25/2023 16:51   DG Chest Portable 1 View  Result Date: 09/25/2023 CLINICAL DATA:  Holly [name], left hip and arm pain EXAM: PORTABLE CHEST 1 VIEW COMPARISON:  02/05/2021 FINDINGS: Single frontal view of the chest demonstrates a stable cardiac silhouette. Continued ectasia and atherosclerosis of the thoracic aorta. No acute airspace disease, effusion, or pneumothorax. No acute bony abnormality. IMPRESSION: 1. No acute intrathoracic process. Electronically Signed   By: Sharlet Salina M.D.   On: 09/25/2023 16:51   DG Elbow Complete Left  Result Date: 09/25/2023 CLINICAL DATA:  Holly [name] downstairs, left arm pain EXAM: LEFT ELBOW - COMPLETE 3+ VIEW COMPARISON:  08/06/2022 FINDINGS: Frontal, bilateral oblique, and lateral views of the left elbow are obtained. Evaluation is limited due to suboptimal positioning. There is an acute intra-articular fracture involving the lateral humeral epicondyle, best seen on the oblique projections. Chronic healed radial head fracture is unchanged since prior exam. No other acute bony abnormalities. Alignment appears anatomic. Elevation of the anterior and posterior fat pads consistent with joint effusion. Soft tissue swelling of the elbow and proximal forearm. IMPRESSION: 1. Acute minimally displaced intra-articular lateral humeral epicondyle fracture, with associated joint effusion. 2. Chronic healed radial head fracture unchanged. 3. Soft tissue swelling. Electronically Signed   By: Sharlet Salina M.D.   On: 09/25/2023 16:50    Review of Systems  Constitutional:  Negative for chills and fever.   Respiratory:  Negative for shortness of breath and wheezing.   Cardiovascular:  Negative for chest pain and palpitations.  Gastrointestinal:  Negative for abdominal pain.  Musculoskeletal:  Positive for arthralgias and joint swelling.  Neurological:  Negative for dizziness, light-headedness and numbness.  Psychiatric/Behavioral:  Negative for behavioral problems.    Blood pressure (!) 117/101, pulse 93, temperature 97.6 F (36.4 C), temperature source Oral, resp. rate (!) 26, height 5\' 2"  (1.575 m), weight 36.3 kg, SpO2 94%. Physical Exam Constitutional:      General: She is not in acute distress.    Appearance: Normal appearance.     Comments: Underweight  HENT:     Head: Atraumatic.  Eyes:     Extraocular Movements: Extraocular movements intact.  Cardiovascular:     Rate and Rhythm: Normal rate.     Pulses: Normal pulses.  Pulmonary:     Effort: Pulmonary effort is normal.  Abdominal:     Tenderness: There is no abdominal tenderness.  Musculoskeletal:     Cervical back: Normal range of motion.     Comments: Patient is tender to palpation about the left elbow where she developed a hematoma as well as a skin tear that has been dressed. No motion of the left elbow attempted but patient can move her fingers of the left hand. No tenderness to palpation about the left shoulder. Distally neurovascularly intact in UE and LE. Calves soft and nontender. Tenderness to palpation about the left lateral hip. Left hip externally rotated and leg shortened. No significant swelling noted about the hip or thigh. Nontender about the left knee. Dorsiflexion and plantar flexion intact BLE.   Skin:    Capillary Refill: Capillary refill takes less than 2 seconds.  Neurological:     Mental Status: She is alert and oriented to person, place, and time. Mental status is at baseline.  Psychiatric:        Behavior: Behavior normal.     Assessment/Plan: Left hip intertrochanteric fracture- Patient will  require an IM nail for fracture. Discussed the surgery with the patient and her son including risks, benefits, and expected postoperative course. Patient should be able to be WBAT post op. Will likely need SNF placement especially given concomitant left elbow fracture. NPO after midnight except for sips with meds. Patient would likely benefit from ERAS supplement up to 430am. Patient to be admitted under medicine service Left elbow lateral epicondyle fracture- long arm splint ordered by ED, waiting for ortho tech to place. Patient awaiting CT of the left elbow to determine if surgery will be necessary for left elbow. Patient is to be NWB LUE.   Denaly Gatling L. [place], PA-C 09/25/2023, 5:46 PM

## 2023-09-25 NOTE — ED Notes (Signed)
ED TO INPATIENT HANDOFF REPORT  ED Nurse Name and Phone #: chris c [phone]   S Name/Age/Gender Holly [name] 89 y.o. female Room/Bed: 002C/002C  Code Status   Code Status: Full Code  Home/SNF/Other Home Patient oriented to: self and situation Is this baseline? Yes   Triage Complete: Triage complete  Chief Complaint Fall [W19.XXXA]  Triage Note Per EMS and pt report, pt missed the last step on her front porch. She landed on the pavement. She is now reporting L hip pain, L head pain, and L arm pain. Denies LOC. Denies blood thinners. L arm abrasion with dressing from EMS.  EMS reports left leg shortening and rotation.   Allergies Allergies  Allergen Reactions   Levofloxacin     REACTION: unspecified   Macrobid [Nitrofurantoin Macrocrystal] Nausea Only   Sulfamethoxazole     REACTION: unspecified   Tramadol Other (See Comments)    Shakes    Amoxicillin Rash    Has patient had a PCN reaction causing immediate rash, facial/tongue/throat swelling, SOB or lightheadedness with hypotension: No Has patient had a PCN reaction causing severe rash involving mucus membranes or skin necrosis: No Has patient had a PCN reaction that required hospitalization: No Has patient had a PCN reaction occurring within the last 10 years: No If all of the above answers are "NO", then may proceed with Cephalosporin use.   Made "bottom area" very raw    Level of Care/Admitting Diagnosis ED Disposition     ED Disposition  Admit   Condition  --   Comment  Hospital Area: [facility] [[id number]]  Level of Care: Progressive [102]  Admit to Progressive based on following criteria: Other see comments  Comments: Hypotensive, sensitive to pain medication.  May admit patient to [facility] or [facility] if equivalent level of care is available:: No  Covid Evaluation: Asymptomatic - no recent exposure (last 10 days) testing not required  Diagnosis: Fall [[id number]]  Admitting  Physician: Tereasa Coop [[id number]]  Attending Physician: Tereasa Coop [[id number]]  Certification:: I certify this patient will need inpatient services for at least 2 midnights  Expected Medical Readiness: 09/29/2023          B Medical/Surgery History Past Medical History:  Diagnosis Date   CAD (coronary artery disease)    CELLULITIS 06/18/2008   CHF (congestive heart failure) (HCC)    CHRONIC OBSTRUCTIVE PULMONARY DISEASE, ACUTE EXACERBATION 11/16/2007   COPD 05/18/2007   COVID-19    GERD 12/25/2007   HYPERLIPIDEMIA 05/18/2007   HYPOTHYROIDISM 05/18/2007   Macular degeneration    Stroke (HCC) 08/2018   Urosepsis    Past Surgical History:  Procedure Laterality Date   CATARACT EXTRACTION     DILATION AND CURETTAGE OF UTERUS     KYPHOPLASTY N/A 05/02/2020   Procedure: Thoracic eleven KYPHOPLASTY;  Surgeon: Coletta Memos, MD;  Location: [facility] OR;  Service: Neurosurgery;  Laterality: N/A;  3C   TEE WITHOUT CARDIOVERSION N/A 09/01/2018   Procedure: TRANSESOPHAGEAL ECHOCARDIOGRAM (TEE);  Surgeon: Jodelle Red, MD;  Location: [facility] ENDOSCOPY;  Service: Cardiovascular;  Laterality: N/A;   VARICOSE VEIN SURGERY       A IV Location/Drains/Wounds Patient Lines/Drains/Airways Status     Active Line/Drains/Airways     Name Placement date Placement time Site Days   Peripheral IV 09/25/23 20 G 1" Anterior;Distal;Right Forearm 09/25/23  1719  Forearm  less than 1            Intake/Output Last 24 hours  Intake/Output Summary (  Last 24 hours) at 09/25/2023 2044 Last data filed at 09/25/2023 1831 Gross per 24 hour  Intake 1100 ml  Output --  Net 1100 ml    Labs/Imaging Results for orders placed or performed during the hospital encounter of 09/25/23 (from the past 48 hour(s))  Basic metabolic panel     Status: Abnormal   Collection Time: 09/25/23  4:30 PM  Result Value Ref Range   Sodium 139 135 - 145 mmol/L   Potassium 5.6 (H) 3.5 - 5.1 mmol/L    Comment: HEMOLYSIS AT  THIS LEVEL MAY AFFECT RESULT   Chloride 104 98 - 111 mmol/L   CO2 24 22 - 32 mmol/L   Glucose, Bld 125 (H) 70 - 99 mg/dL    Comment: Glucose reference range applies only to samples taken after fasting for at least 8 hours.   BUN 25 (H) 8 - 23 mg/dL   Creatinine, Ser 1.19 (H) 0.44 - 1.00 mg/dL   Calcium 8.7 (L) 8.9 - 10.3 mg/dL   GFR, Estimated 51 (L) >60 mL/min    Comment: (NOTE) Calculated using the CKD-EPI Creatinine Equation (2021)    Anion gap 11 5 - 15    Comment: Performed at [facility], [address], [place], [place] [address]  CBC     Status: Abnormal   Collection Time: 09/25/23  5:21 PM  Result Value Ref Range   WBC 14.9 (H) 4.0 - 10.5 K/uL   RBC 3.27 (L) 3.87 - 5.11 MIL/uL   Hemoglobin 10.3 (L) 12.0 - 15.0 g/dL   HCT 95.6 (L) 21.3 - 08.6 %   MCV 96.6 80.0 - 100.0 fL   MCH 31.5 26.0 - 34.0 pg   MCHC 32.6 30.0 - 36.0 g/dL   RDW 57.8 (H) 46.9 - 62.9 %   Platelets 169 150 - 400 K/uL   nRBC 0.0 0.0 - 0.2 %    Comment: Performed at [facility], [address] [place], [place] [address]   CT ABDOMEN PELVIS WO CONTRAST  Result Date: 09/25/2023 CLINICAL DATA:  Abdominal trauma, blunt.  Hypotensive EXAM: CT ABDOMEN AND PELVIS WITHOUT CONTRAST TECHNIQUE: Multidetector CT imaging of the abdomen and pelvis was performed following the standard protocol without IV contrast. RADIATION DOSE REDUCTION: This exam was performed according to the departmental dose-optimization program which includes automated exposure control, adjustment of the mA and/or kV according to patient size and/or use of iterative reconstruction technique. COMPARISON:  None Available. FINDINGS: Lower chest: Bronchial wall thickening. Aortic valve leaflet and four-vessel coronary calcification. Enlarged heart. Cardiac changes suggestive of anemia. Hepatobiliary: Not enlarged. No focal lesion. The gallbladder is otherwise unremarkable with no radio-opaque gallstones. No biliary ductal dilatation.  Pancreas: Normal pancreatic contour. No main pancreatic duct dilatation. Spleen: Not enlarged. No focal lesion. Adrenals/Urinary Tract: No nodularity bilaterally. No hydroureteronephrosis. No nephroureterolithiasis. No contour deforming renal mass. The urinary bladder is unremarkable. Stomach/Bowel: Slightly limited evaluation due to motion artifact. Stomach is within normal limits. No evidence of bowel wall thickening or dilatation. Colonic diverticulosis. The appendix is not definitely identified with no inflammatory changes in the right lower quadrant to suggest acute appendicitis. Vasculature/Lymphatic: No abdominal aorta or iliac aneurysm. No abdominal, pelvic, inguinal lymphadenopathy. Reproductive: Normal. Other: No simple free fluid ascites. No pneumoperitoneum. No mesenteric hematoma identified. No organized fluid collection. Musculoskeletal: Right inguinal region femoral vessel coiling. No significant soft tissue hematoma. Acute comminuted and displaced left femoral intertrochanteric fracture. Acute left superior pubic rami minimally displaced fracture. No spinal  fracture. Diffusely decreased bone density. Partially visualized acute comminuted and displaced left humeral supracondylar fracture. Ports and Devices: None. IMPRESSION: 1. Acute comminuted and displaced left femoral intertrochanteric fracture. 2. Acute left superior pubic rami minimally displaced fracture. 3. Partially visualized acute comminuted and displaced left humeral supracondylar fracture. Ports and Devices: None. Finding better evaluated on left elbow radiograph 09/25/2023. 4. No acute intra-abdominal or intrapelvic traumatic injury with mid evaluation on this noncontrast study with motion artifact. 5. No acute fracture or traumatic malalignment of the lumbar spine. Other imaging findings of potential clinical significance: 1. Colonic diverticulosis with no acute diverticulitis. 2. Aortic Atherosclerosis (ICD10-I70.0) including aortic  valve leaflet and four-vessel coronary artery calcification. Electronically Signed   By: Tish Frederickson M.D.   On: 09/25/2023 20:39   CT ELBOW LEFT WO CONTRAST  Result Date: 09/25/2023 CLINICAL DATA:  Left elbow fracture. EXAM: CT OF THE UPPER LEFT EXTREMITY WITHOUT CONTRAST TECHNIQUE: Multidetector CT imaging of the upper left extremity was performed according to the standard protocol. RADIATION DOSE REDUCTION: This exam was performed according to the departmental dose-optimization program which includes automated exposure control, adjustment of the mA and/or kV according to patient size and/or use of iterative reconstruction technique. COMPARISON:  Radiograph of the left elbow from earlier today. FINDINGS: Bones/Joint/Cartilage There is an acute, mildly comminuted, intra-articular fracture deformity involving the lateral humeral epicondyle. Slight impaction of the fracture fragments identified. Chronic, slightly impacted fracture deformity involving the lateral aspect of the radial head is again noted in appears similar when compared with the plain film radiographs from 08/06/2022. Ligaments Suboptimally assessed by CT. Muscles and Tendons No mass identified. Soft tissues Small joint effusion. Soft tissue edema and hematoma identified overlying the lateral epicondyle of the distal humerus. IMPRESSION: 1. Acute, mildly comminuted, intra-articular fracture deformity involving the lateral humeral epicondyle. 2. Chronic, slightly impacted fracture deformity involving the lateral aspect of the radial head is again noted in appears similar when compared with the plain film radiographs from 08/06/2022. 3. Small joint effusion. 4. Soft tissue edema and hematoma identified overlying the lateral epicondyle of the distal humerus. Electronically Signed   By: Signa Kell M.D.   On: 09/25/2023 18:43   CT Head Wo Contrast  Result Date: 09/25/2023 CLINICAL DATA:  Fall, head and neck trauma EXAM: CT HEAD WITHOUT  CONTRAST CT CERVICAL SPINE WITHOUT CONTRAST TECHNIQUE: Multidetector CT imaging of the head and cervical spine was performed following the standard protocol without intravenous contrast. Multiplanar CT image reconstructions of the cervical spine were also generated. RADIATION DOSE REDUCTION: This exam was performed according to the departmental dose-optimization program which includes automated exposure control, adjustment of the mA and/or kV according to patient size and/or use of iterative reconstruction technique. COMPARISON:  MR brain, 04/16/2022 FINDINGS: CT HEAD FINDINGS Brain: No evidence of acute infarction, hemorrhage, hydrocephalus, extra-axial collection or mass lesion/mass effect. Periventricular and deep white matter hypodensity. Vascular: No hyperdense vessel or unexpected calcification. Skull: Normal. Negative for fracture or focal lesion. Sinuses/Orbits: No acute finding. Other: None. CT CERVICAL SPINE FINDINGS Alignment: Normal. Skull base and vertebrae: No acute fracture. No primary bone lesion or focal pathologic process. Soft tissues and spinal canal: No prevertebral fluid or swelling. No visible canal hematoma. Disc levels: Mild multilevel disc space height loss and osteophytosis throughout the cervical spine. Upper chest: Negative. Other: None. IMPRESSION: 1. No acute intracranial pathology. Small-vessel white matter disease. 2. No fracture or static subluxation of the cervical spine. 3. Mild multilevel cervical disc degenerative disease. Electronically Signed  By: Jearld Lesch M.D.   On: 09/25/2023 17:16   CT Cervical Spine Wo Contrast  Result Date: 09/25/2023 CLINICAL DATA:  Fall, head and neck trauma EXAM: CT HEAD WITHOUT CONTRAST CT CERVICAL SPINE WITHOUT CONTRAST TECHNIQUE: Multidetector CT imaging of the head and cervical spine was performed following the standard protocol without intravenous contrast. Multiplanar CT image reconstructions of the cervical spine were also generated.  RADIATION DOSE REDUCTION: This exam was performed according to the departmental dose-optimization program which includes automated exposure control, adjustment of the mA and/or kV according to patient size and/or use of iterative reconstruction technique. COMPARISON:  MR brain, 04/16/2022 FINDINGS: CT HEAD FINDINGS Brain: No evidence of acute infarction, hemorrhage, hydrocephalus, extra-axial collection or mass lesion/mass effect. Periventricular and deep white matter hypodensity. Vascular: No hyperdense vessel or unexpected calcification. Skull: Normal. Negative for fracture or focal lesion. Sinuses/Orbits: No acute finding. Other: None. CT CERVICAL SPINE FINDINGS Alignment: Normal. Skull base and vertebrae: No acute fracture. No primary bone lesion or focal pathologic process. Soft tissues and spinal canal: No prevertebral fluid or swelling. No visible canal hematoma. Disc levels: Mild multilevel disc space height loss and osteophytosis throughout the cervical spine. Upper chest: Negative. Other: None. IMPRESSION: 1. No acute intracranial pathology. Small-vessel white matter disease. 2. No fracture or static subluxation of the cervical spine. 3. Mild multilevel cervical disc degenerative disease. Electronically Signed   By: Jearld Lesch M.D.   On: 09/25/2023 17:16   DG HIP UNILAT WITH PELVIS 2-3 VIEWS LEFT  Result Date: 09/25/2023 CLINICAL DATA:  Larey Seat, left hip pain EXAM: DG HIP (WITH OR WITHOUT PELVIS) 2-3V LEFT COMPARISON:  None Available. FINDINGS: Frontal view of the pelvis as well as frontal and frogleg lateral views of the left hip are obtained. There is a comminuted intertrochanteric left hip fracture, with impaction and varus angulation at the fracture site. No dislocation. Symmetrical bilateral hip osteoarthritis. The remainder of the bony pelvis is unremarkable. IMPRESSION: Upper 1 comminuted intertrochanteric left hip fracture, with impaction and varus angulation. Electronically Signed   By:  Sharlet Salina M.D.   On: 09/25/2023 16:51   DG Chest Portable 1 View  Result Date: 09/25/2023 CLINICAL DATA:  Larey Seat, left hip and arm pain EXAM: PORTABLE CHEST 1 VIEW COMPARISON:  02/05/2021 FINDINGS: Single frontal view of the chest demonstrates a stable cardiac silhouette. Continued ectasia and atherosclerosis of the thoracic aorta. No acute airspace disease, effusion, or pneumothorax. No acute bony abnormality. IMPRESSION: 1. No acute intrathoracic process. Electronically Signed   By: Sharlet Salina M.D.   On: 09/25/2023 16:51   DG Elbow Complete Left  Result Date: 09/25/2023 CLINICAL DATA:  Larey Seat downstairs, left arm pain EXAM: LEFT ELBOW - COMPLETE 3+ VIEW COMPARISON:  08/06/2022 FINDINGS: Frontal, bilateral oblique, and lateral views of the left elbow are obtained. Evaluation is limited due to suboptimal positioning. There is an acute intra-articular fracture involving the lateral humeral epicondyle, best seen on the oblique projections. Chronic healed radial head fracture is unchanged since prior exam. No other acute bony abnormalities. Alignment appears anatomic. Elevation of the anterior and posterior fat pads consistent with joint effusion. Soft tissue swelling of the elbow and proximal forearm. IMPRESSION: 1. Acute minimally displaced intra-articular lateral humeral epicondyle fracture, with associated joint effusion. 2. Chronic healed radial head fracture unchanged. 3. Soft tissue swelling. Electronically Signed   By: Sharlet Salina M.D.   On: 09/25/2023 16:50    Pending Labs [employer] (From admission, onward)     Start  Ordered   09/26/23 0500  Comprehensive metabolic panel  Tomorrow morning,   R        09/25/23 2035   09/26/23 0500  CBC  Tomorrow morning,   R        09/25/23 2035   09/25/23 2036  CK  Add-on,   AD        09/25/23 2035   09/25/23 2036  Hepatic function panel  Add-on,   AD        09/25/23 2035   09/25/23 1918  Hemoglobin and hematocrit, blood  Once,   STAT         09/25/23 1917            Vitals/Pain Today's Vitals   09/25/23 1930 09/25/23 1945 09/25/23 1950 09/25/23 2015  BP: (!) 89/36 (!) 94/42  (!) 87/47  Pulse: 76 72  80  Resp: 17 (!) 22  18  Temp:      TempSrc:      SpO2: (!) 89% (!) 89%  (!) 87%  Weight:      Height:      PainSc:   7      Isolation Precautions No active isolations  Medications Medications  acetaminophen (OFIRMEV) IV 1,000 mg (0 mg Intravenous Stopped 09/25/23 1746)  naloxone (NARCAN) injection 0.4 mg (0.4 mg Intravenous Not Given 09/25/23 1905)  midodrine (PROAMATINE) tablet 10 mg (has no administration in time range)  0.9 %  sodium chloride infusion (has no administration in time range)  mometasone-formoterol (DULERA) 200-5 MCG/ACT inhaler 2 puff (has no administration in time range)  ipratropium-albuterol (DUONEB) 0.5-2.5 (3) MG/3ML nebulizer solution 3 mL (has no administration in time range)  sodium chloride flush (NS) 0.9 % injection 3 mL (has no administration in time range)  sodium chloride flush (NS) 0.9 % injection 3 mL (has no administration in time range)  0.9 %  sodium chloride infusion (has no administration in time range)  bisacodyl (DULCOLAX) EC tablet 5 mg (has no administration in time range)  ondansetron (ZOFRAN) tablet 4 mg (has no administration in time range)    Or  ondansetron (ZOFRAN) injection 4 mg (has no administration in time range)  atorvastatin (LIPITOR) tablet 40 mg (has no administration in time range)  levothyroxine (SYNTHROID) tablet 112 mcg (has no administration in time range)  Tdap (BOOSTRIX) injection 0.5 mL (0.5 mLs Intramuscular Given 09/25/23 1730)  ondansetron (ZOFRAN) injection 4 mg (4 mg Intravenous Given 09/25/23 1726)  sodium chloride 0.9 % bolus 1,000 mL (0 mLs Intravenous Stopped 09/25/23 1831)  feeding supplement (ENSURE PRE-SURGERY) liquid 296 mL (296 mLs Oral Given 09/25/23 1823)  sodium chloride 0.9 % bolus 1,000 mL (0 mLs Intravenous Stopped 09/25/23  2018)  naloxone (NARCAN) 4 MG/0.1ML nasal spray kit ( Nasal Provided for home use 09/25/23 1904)    Mobility non-ambulatory     Focused Assessments Cardiac Assessment Handoff:    No results found for: "CKTOTAL", "CKMB", "CKMBINDEX", "TROPONINI" No results found for: "DDIMER" Does the Patient currently have chest pain? No    R Recommendations: See Admitting Provider Note  Report given to:   Additional Notes:

## 2023-09-25 NOTE — Progress Notes (Addendum)
  Patient's hemoglobin drop trend 10.3-7.9.  Rechecking another stat H&H.  If hemoglobin is actually low will obtain another CTA chest abdomen pelvis and preparing for blood transfusion.  If repeat hemoglobin shows actual drop of hemoglobin in that case we will transfuse blood as well.  Discontinued   Tereasa Coop, MD [facility] 09/25/2023, 10:36 PM

## 2023-09-25 NOTE — H&P (Addendum)
History and Physical    Holly [name] BMW:[id number] DOB: 1928/04/24 DOA: 09/25/2023  PCP: Ardith Dark, MD   Patient coming from: Home   Chief Complaint:  Chief Complaint  Patient presents with   Leg Injury   Fall   ED TRIAGE note:Per EMS and pt report, pt missed the last step on her front porch. She landed on the pavement. She is now reporting L hip pain, L head pain, and L arm pain. Denies LOC. Denies blood thinners. L arm abrasion with dressing from EMS. EMS reports left leg shortening and rotation.   HPI:  Holly [name] is a 89 y.o. female with medical history significant of COPD, seborrheic keratosis, hypothyroidism, generalized anxiety disorder, and hyperlipidemia presented to emergency department for evaluation for a sustained fall.  Patient reported that she missed the last step of her front porch and landed on pavement and since then reporting left-sided hip, left-sided hand and left-sided arm pain. During my evaluation at the bedside patient's daughter at the bedside who helped with the history.  As mentioned above patient had a unsustained fall at home.  Patient did not loss consciousness or hit her head.  Since the fall patient reported having severe left-sided upper and lower extremity pain.  Patient denies any chest pain, headache, blurry vision, nausea, vomiting, abdominal pain, constipation or diarrhea.  Patient's daughter reported that patient has history of chronic hypotension but previously had history of hypertension but not currently on any medication due to hypotension.  ED Course:  I presentation to ED patient was hemodynamically stable however blood pressure dropped to 94/82 and O2 sat dropped to 89% room air. BMP showing hyperkalemia 5.6 otherwise unremarkable. CBC showing leukocytosis 14.9, hemoglobin 10.3 which dropped from 12.7 as compared to 5 months ago, platelet count 169.  Extensive imaging following. Chest x-ray no acute cardiopulmonary process X-ray  left elbow showed: 1. Acute minimally displaced intra-articular lateral humeral  epicondyle fracture, with associated joint effusion. 2. Chronic healed radial head fracture unchanged.  Left hip x-ray following:Upper 1 comminuted intertrochanteric left hip fracture, with impaction and varus angulation.  CT head-no acute intracranial process. See cervical spine no subluxation of the cervical spine.  CT elbow of the left: IMPRESSION: 1. Acute, mildly comminuted, intra-articular fracture deformity involving the lateral humeral epicondyle. 2. Chronic, slightly impacted fracture deformity involving the lateral aspect of the radial head is again noted in appears similar when compared with the plain film radiographs from 08/06/2022. 3. Small joint effusion. 4. Soft tissue edema and hematoma identified overlying the lateral epicondyle of the distal humerus.  CT abdomen pelvis no acute intra-abdominal or intrapelvic traumatic injury.  Chronic diverticulosis.  Aortic atherosclerosis.  In the ED patient received morphine 2 mg, 2 L of NS bolus and Td booster.  ED physician Dr. Rhae Hammock reported that patient's blood pressure dropped to 75/39 dose of morphine in the ED.  Seems like the patient is very sensitive to morphine.  Also due to dropping of the hemoglobin CT abdomen pelvis has been obtained to rule out any intra-abdominal bleeding.  Official reading is pending by radiology.  Orthopedics has been consulted: Orthopedics PA [employer] evaluated patient.  For the left hip intertrochanteric fracture plan for IM nail fixation.  Recommended to keep n.p.o. after midnight. For the left elbow lateral epicondyle fracture-long-term splint ordered by the ED waiting for orthotic to place.  Hospitalist has been contacted for further evaluation management of fracture, hypotension and pain management.  Review of Systems:  Review of Systems  Constitutional:  Negative for chills, fever, malaise/fatigue and  weight loss.  HENT:  Positive for hearing loss.   Respiratory:  Negative for cough, sputum production and shortness of breath.   Cardiovascular:  Negative for chest pain and leg swelling.  Gastrointestinal:  Negative for abdominal pain, heartburn and nausea.  Genitourinary:  Negative for dysuria, frequency and urgency.  Musculoskeletal:  Negative for back pain.       Left-sided arm, forearm and left-sided hip joint pain.  Neurological:  Negative for dizziness, loss of consciousness, weakness and headaches.  Psychiatric/Behavioral:  The patient is not nervous/anxious.     Past Medical History:  Diagnosis Date   CAD (coronary artery disease)    CELLULITIS 06/18/2008   CHF (congestive heart failure) (HCC)    CHRONIC OBSTRUCTIVE PULMONARY DISEASE, ACUTE EXACERBATION 11/16/2007   COPD 05/18/2007   COVID-19    GERD 12/25/2007   HYPERLIPIDEMIA 05/18/2007   HYPOTHYROIDISM 05/18/2007   Macular degeneration    Stroke (HCC) 08/2018   Urosepsis     Past Surgical History:  Procedure Laterality Date   CATARACT EXTRACTION     DILATION AND CURETTAGE OF UTERUS     KYPHOPLASTY N/A 05/02/2020   Procedure: Thoracic eleven KYPHOPLASTY;  Surgeon: Coletta Memos, MD;  Location: [facility] OR;  Service: Neurosurgery;  Laterality: N/A;  3C   TEE WITHOUT CARDIOVERSION N/A 09/01/2018   Procedure: TRANSESOPHAGEAL ECHOCARDIOGRAM (TEE);  Surgeon: Jodelle Red, MD;  Location: [facility] ENDOSCOPY;  Service: Cardiovascular;  Laterality: N/A;   VARICOSE VEIN SURGERY       reports that she quit smoking about 37 years ago. Her smoking use included cigarettes. She started smoking about 77 years ago. She has never used smokeless tobacco. She reports that she does not drink alcohol and does not use drugs.  Allergies  Allergen Reactions   Levofloxacin     REACTION: unspecified   Macrobid [Nitrofurantoin Macrocrystal] Nausea Only   Sulfamethoxazole     REACTION: unspecified   Tramadol Other (See Comments)    Shakes     Amoxicillin Rash    Has patient had a PCN reaction causing immediate rash, facial/tongue/throat swelling, SOB or lightheadedness with hypotension: No Has patient had a PCN reaction causing severe rash involving mucus membranes or skin necrosis: No Has patient had a PCN reaction that required hospitalization: No Has patient had a PCN reaction occurring within the last 10 years: No If all of the above answers are "NO", then may proceed with Cephalosporin use.   Made "bottom area" very raw    Family History  Problem Relation Age of Onset   Ovarian cancer Mother    Diabetes Mellitus II Brother     Prior to Admission medications   Medication Sig Start Date End Date Taking? Authorizing Provider  acetaminophen (TYLENOL) 500 MG tablet Take 500 mg by mouth every 6 (six) hours as needed for mild pain.     [provider]  albuterol (VENTOLIN HFA) 108 (90 Base) MCG/ACT inhaler INHALE 1-2 PUFFS BY MOUTH EVERY 6 HOURS AS NEEDED FOR WHEEZE OR SHORTNESS OF BREATH 07/11/23   Ardith Dark, MD  aspirin EC 81 MG tablet Take 1 tablet (81 mg total) by mouth daily. Swallow whole. 02/11/21   Ihor Austin, NP  atorvastatin (LIPITOR) 40 MG tablet Take 1 tablet (40 mg total) by mouth daily. 01/18/23   Ardith Dark, MD  budesonide-formoterol [facility]) 160-4.5 MCG/ACT inhaler Inhale 2 puffs into the lungs 2 (two) times daily. 04/15/23  Ardith Dark, MD  levothyroxine (SYNTHROID) 112 MCG tablet TAKE 1 TABLET BY MOUTH EVERY DAY 07/27/23   Ardith Dark, MD  LORazepam (ATIVAN) 0.5 MG tablet 1-2 tabs 30 - 60 min prior to MRI. Do not drive with this medicine. Patient not taking: Reported on 08/15/2023 04/14/23   Richardean Sale, DO  Multiple Vitamins-Minerals (ICAPS PO) Take 1 capsule by mouth 2 (two) times daily.    [provider]  Spacer/Aero-Holding Chambers (AEROCHAMBER PLUS FLO-VU MEDIUM) MISC Use daily as needed Patient not taking: Reported on 08/15/2023 04/15/23   Ardith Dark, MD      Physical Exam: Vitals:   09/25/23 1930 09/25/23 1945 09/25/23 2015 09/25/23 2046  BP: (!) 89/36 (!) 94/42 (!) 87/47   Pulse: 76 72 80   Resp: 17 (!) 22 18   Temp:    98.1 F (36.7 C)  TempSrc:      SpO2: (!) 89% (!) 89% (!) 87%   Weight:      Height:        Physical Exam HENT:     Nose: Nose normal.     Mouth/Throat:     Mouth: Mucous membranes are moist.  Cardiovascular:     Rate and Rhythm: Normal rate and regular rhythm.     Pulses: Normal pulses.     Heart sounds: Normal heart sounds.  Pulmonary:     Effort: Pulmonary effort is normal.     Breath sounds: Normal breath sounds.  Abdominal:     General: Bowel sounds are normal.  Musculoskeletal:        General: Deformity present.     Cervical back: Neck supple.     Right lower leg: No edema.     Left lower leg: No edema.  Skin:    Capillary Refill: Capillary refill takes less than 2 seconds.  Neurological:     Mental Status: She is alert and oriented to person, place, and time.  Psychiatric:        Mood and Affect: Mood normal.      Labs on Admission: I have personally reviewed following labs and imaging studies  CBC: Recent Labs  Lab 09/25/23 1721 09/25/23 2038  WBC 14.9*  --   HGB 10.3* 7.9*  HCT 31.6* 25.5*  MCV 96.6  --   PLT 169  --    Basic Metabolic Panel: Recent Labs  Lab 09/25/23 1630  NA 139  K 5.6*  CL 104  CO2 24  GLUCOSE 125*  BUN 25*  CREATININE 1.03*  CALCIUM 8.7*   GFR: Estimated Creatinine Clearance: 19.6 mL/min (A) (by C-G formula based on SCr of 1.03 mg/dL (H)). Liver Function Tests: No results for input(s): "AST", "ALT", "ALKPHOS", "BILITOT", "PROT", "ALBUMIN" in the last 168 hours. No results for input(s): "LIPASE", "AMYLASE" in the last 168 hours. No results for input(s): "AMMONIA" in the last 168 hours. Coagulation Profile: No results for input(s): "INR", "PROTIME" in the last 168 hours. Cardiac Enzymes: No results for input(s): "CKTOTAL", "CKMB",  "CKMBINDEX", "TROPONINI", "TROPONINIHS" in the last 168 hours. BNP (last 3 results) No results for input(s): "BNP" in the last 8760 hours. HbA1C: No results for input(s): "HGBA1C" in the last 72 hours. CBG: No results for input(s): "GLUCAP" in the last 168 hours. Lipid Profile: No results for input(s): "CHOL", "HDL", "LDLCALC", "TRIG", "CHOLHDL", "LDLDIRECT" in the last 72 hours. Thyroid Function Tests: No results for input(s): "TSH", "T4TOTAL", "FREET4", "T3FREE", "THYROIDAB" in the last 72 hours. Anemia Panel: No results  for input(s): "VITAMINB12", "FOLATE", "FERRITIN", "TIBC", "IRON", "RETICCTPCT" in the last 72 hours. Urine analysis:    Component Value Date/Time   COLORURINE YELLOW 04/16/2022 1517   APPEARANCEUR CLEAR 04/16/2022 1517   LABSPEC 1.010 04/16/2022 1517   PHURINE 6.0 04/16/2022 1517   GLUCOSEU NEGATIVE 04/16/2022 1517   HGBUR NEGATIVE 04/16/2022 1517   HGBUR trace-lysed 12/15/2007 0858   BILIRUBINUR NEGATIVE 04/16/2022 1517   BILIRUBINUR negative 06/05/2020 1413   KETONESUR NEGATIVE 04/16/2022 1517   PROTEINUR NEGATIVE 04/16/2022 1517   UROBILINOGEN 1.0 06/05/2020 1413   UROBILINOGEN 1.0 08/27/2011 1257   NITRITE NEGATIVE 04/16/2022 1517   LEUKOCYTESUR TRACE (A) 04/16/2022 1517    Radiological Exams on Admission: I have personally reviewed images CT ABDOMEN PELVIS WO CONTRAST  Result Date: 09/25/2023 CLINICAL DATA:  Abdominal trauma, blunt.  Hypotensive EXAM: CT ABDOMEN AND PELVIS WITHOUT CONTRAST TECHNIQUE: Multidetector CT imaging of the abdomen and pelvis was performed following the standard protocol without IV contrast. RADIATION DOSE REDUCTION: This exam was performed according to the departmental dose-optimization program which includes automated exposure control, adjustment of the mA and/or kV according to patient size and/or use of iterative reconstruction technique. COMPARISON:  None Available. FINDINGS: Lower chest: Bronchial wall thickening. Aortic  valve leaflet and four-vessel coronary calcification. Enlarged heart. Cardiac changes suggestive of anemia. Hepatobiliary: Not enlarged. No focal lesion. The gallbladder is otherwise unremarkable with no radio-opaque gallstones. No biliary ductal dilatation. Pancreas: Normal pancreatic contour. No main pancreatic duct dilatation. Spleen: Not enlarged. No focal lesion. Adrenals/Urinary Tract: No nodularity bilaterally. No hydroureteronephrosis. No nephroureterolithiasis. No contour deforming renal mass. The urinary bladder is unremarkable. Stomach/Bowel: Slightly limited evaluation due to motion artifact. Stomach is within normal limits. No evidence of bowel wall thickening or dilatation. Colonic diverticulosis. The appendix is not definitely identified with no inflammatory changes in the right lower quadrant to suggest acute appendicitis. Vasculature/Lymphatic: No abdominal aorta or iliac aneurysm. No abdominal, pelvic, inguinal lymphadenopathy. Reproductive: Normal. Other: No simple free fluid ascites. No pneumoperitoneum. No mesenteric hematoma identified. No organized fluid collection. Musculoskeletal: Right inguinal region femoral vessel coiling. No significant soft tissue hematoma. Acute comminuted and displaced left femoral intertrochanteric fracture. Acute left superior pubic rami minimally displaced fracture. No spinal fracture. Diffusely decreased bone density. Partially visualized acute comminuted and displaced left humeral supracondylar fracture. Ports and Devices: None. IMPRESSION: 1. Acute comminuted and displaced left femoral intertrochanteric fracture. 2. Acute left superior pubic rami minimally displaced fracture. 3. Partially visualized acute comminuted and displaced left humeral supracondylar fracture. Ports and Devices: None. Finding better evaluated on left elbow radiograph 09/25/2023. 4. No acute intra-abdominal or intrapelvic traumatic injury with mid evaluation on this noncontrast study with  motion artifact. 5. No acute fracture or traumatic malalignment of the lumbar spine. Other imaging findings of potential clinical significance: 1. Colonic diverticulosis with no acute diverticulitis. 2. Aortic Atherosclerosis (ICD10-I70.0) including aortic valve leaflet and four-vessel coronary artery calcification. Electronically Signed   By: Tish Frederickson M.D.   On: 09/25/2023 20:39   CT ELBOW LEFT WO CONTRAST  Result Date: 09/25/2023 CLINICAL DATA:  Left elbow fracture. EXAM: CT OF THE UPPER LEFT EXTREMITY WITHOUT CONTRAST TECHNIQUE: Multidetector CT imaging of the upper left extremity was performed according to the standard protocol. RADIATION DOSE REDUCTION: This exam was performed according to the departmental dose-optimization program which includes automated exposure control, adjustment of the mA and/or kV according to patient size and/or use of iterative reconstruction technique. COMPARISON:  Radiograph of the left elbow from earlier today. FINDINGS: Bones/Joint/Cartilage  There is an acute, mildly comminuted, intra-articular fracture deformity involving the lateral humeral epicondyle. Slight impaction of the fracture fragments identified. Chronic, slightly impacted fracture deformity involving the lateral aspect of the radial head is again noted in appears similar when compared with the plain film radiographs from 08/06/2022. Ligaments Suboptimally assessed by CT. Muscles and Tendons No mass identified. Soft tissues Small joint effusion. Soft tissue edema and hematoma identified overlying the lateral epicondyle of the distal humerus. IMPRESSION: 1. Acute, mildly comminuted, intra-articular fracture deformity involving the lateral humeral epicondyle. 2. Chronic, slightly impacted fracture deformity involving the lateral aspect of the radial head is again noted in appears similar when compared with the plain film radiographs from 08/06/2022. 3. Small joint effusion. 4. Soft tissue edema and hematoma  identified overlying the lateral epicondyle of the distal humerus. Electronically Signed   By: Signa Kell M.D.   On: 09/25/2023 18:43   CT Head Wo Contrast  Result Date: 09/25/2023 CLINICAL DATA:  Fall, head and neck trauma EXAM: CT HEAD WITHOUT CONTRAST CT CERVICAL SPINE WITHOUT CONTRAST TECHNIQUE: Multidetector CT imaging of the head and cervical spine was performed following the standard protocol without intravenous contrast. Multiplanar CT image reconstructions of the cervical spine were also generated. RADIATION DOSE REDUCTION: This exam was performed according to the departmental dose-optimization program which includes automated exposure control, adjustment of the mA and/or kV according to patient size and/or use of iterative reconstruction technique. COMPARISON:  MR brain, 04/16/2022 FINDINGS: CT HEAD FINDINGS Brain: No evidence of acute infarction, hemorrhage, hydrocephalus, extra-axial collection or mass lesion/mass effect. Periventricular and deep white matter hypodensity. Vascular: No hyperdense vessel or unexpected calcification. Skull: Normal. Negative for fracture or focal lesion. Sinuses/Orbits: No acute finding. Other: None. CT CERVICAL SPINE FINDINGS Alignment: Normal. Skull base and vertebrae: No acute fracture. No primary bone lesion or focal pathologic process. Soft tissues and spinal canal: No prevertebral fluid or swelling. No visible canal hematoma. Disc levels: Mild multilevel disc space height loss and osteophytosis throughout the cervical spine. Upper chest: Negative. Other: None. IMPRESSION: 1. No acute intracranial pathology. Small-vessel white matter disease. 2. No fracture or static subluxation of the cervical spine. 3. Mild multilevel cervical disc degenerative disease. Electronically Signed   By: Jearld Lesch M.D.   On: 09/25/2023 17:16   CT Cervical Spine Wo Contrast  Result Date: 09/25/2023 CLINICAL DATA:  Fall, head and neck trauma EXAM: CT HEAD WITHOUT CONTRAST CT  CERVICAL SPINE WITHOUT CONTRAST TECHNIQUE: Multidetector CT imaging of the head and cervical spine was performed following the standard protocol without intravenous contrast. Multiplanar CT image reconstructions of the cervical spine were also generated. RADIATION DOSE REDUCTION: This exam was performed according to the departmental dose-optimization program which includes automated exposure control, adjustment of the mA and/or kV according to patient size and/or use of iterative reconstruction technique. COMPARISON:  MR brain, 04/16/2022 FINDINGS: CT HEAD FINDINGS Brain: No evidence of acute infarction, hemorrhage, hydrocephalus, extra-axial collection or mass lesion/mass effect. Periventricular and deep white matter hypodensity. Vascular: No hyperdense vessel or unexpected calcification. Skull: Normal. Negative for fracture or focal lesion. Sinuses/Orbits: No acute finding. Other: None. CT CERVICAL SPINE FINDINGS Alignment: Normal. Skull base and vertebrae: No acute fracture. No primary bone lesion or focal pathologic process. Soft tissues and spinal canal: No prevertebral fluid or swelling. No visible canal hematoma. Disc levels: Mild multilevel disc space height loss and osteophytosis throughout the cervical spine. Upper chest: Negative. Other: None. IMPRESSION: 1. No acute intracranial pathology. Small-vessel white matter disease.  2. No fracture or static subluxation of the cervical spine. 3. Mild multilevel cervical disc degenerative disease. Electronically Signed   By: Jearld Lesch M.D.   On: 09/25/2023 17:16   DG HIP UNILAT WITH PELVIS 2-3 VIEWS LEFT  Result Date: 09/25/2023 CLINICAL DATA:  Larey Seat, left hip pain EXAM: DG HIP (WITH OR WITHOUT PELVIS) 2-3V LEFT COMPARISON:  None Available. FINDINGS: Frontal view of the pelvis as well as frontal and frogleg lateral views of the left hip are obtained. There is a comminuted intertrochanteric left hip fracture, with impaction and varus angulation at the  fracture site. No dislocation. Symmetrical bilateral hip osteoarthritis. The remainder of the bony pelvis is unremarkable. IMPRESSION: Upper 1 comminuted intertrochanteric left hip fracture, with impaction and varus angulation. Electronically Signed   By: Sharlet Salina M.D.   On: 09/25/2023 16:51   DG Chest Portable 1 View  Result Date: 09/25/2023 CLINICAL DATA:  Larey Seat, left hip and arm pain EXAM: PORTABLE CHEST 1 VIEW COMPARISON:  02/05/2021 FINDINGS: Single frontal view of the chest demonstrates a stable cardiac silhouette. Continued ectasia and atherosclerosis of the thoracic aorta. No acute airspace disease, effusion, or pneumothorax. No acute bony abnormality. IMPRESSION: 1. No acute intrathoracic process. Electronically Signed   By: Sharlet Salina M.D.   On: 09/25/2023 16:51   DG Elbow Complete Left  Result Date: 09/25/2023 CLINICAL DATA:  Larey Seat downstairs, left arm pain EXAM: LEFT ELBOW - COMPLETE 3+ VIEW COMPARISON:  08/06/2022 FINDINGS: Frontal, bilateral oblique, and lateral views of the left elbow are obtained. Evaluation is limited due to suboptimal positioning. There is an acute intra-articular fracture involving the lateral humeral epicondyle, best seen on the oblique projections. Chronic healed radial head fracture is unchanged since prior exam. No other acute bony abnormalities. Alignment appears anatomic. Elevation of the anterior and posterior fat pads consistent with joint effusion. Soft tissue swelling of the elbow and proximal forearm. IMPRESSION: 1. Acute minimally displaced intra-articular lateral humeral epicondyle fracture, with associated joint effusion. 2. Chronic healed radial head fracture unchanged. 3. Soft tissue swelling. Electronically Signed   By: Sharlet Salina M.D.   On: 09/25/2023 16:50    EKG: Pending EKG.   Assessment/Plan: Principal Problem:   Fall Active Problems:   Closed left hip fracture (HCC)   Displaced fracture (avulsion) of lateral epicondyle of left  humerus, initial encounter for closed fracture   Transient hypotension   Hypothyroidism   Hyperlipidemia   Essential hypertension   History of COPD   GAD (generalized anxiety disorder)   Fall at home, initial encounter   Hyperkalemia   CKD stage 3a, GFR 45-59 ml/min (HCC)   Normocytic anemia   History of CVA (cerebrovascular accident)    Assessment and Plan: Mechanical fall Acute communicated and displaced left femoral intertrochanteric fracture Acute communicated and intra-articular left humeral epicondylar fracture -Patient presenting with a mechanical fall at home.  In the ED at presentation patient was hemodynamically stable however after morphine 2 mg patient blood pressure dropped significantly. - Chest x-ray no acute cardiopulmonary process -X-ray left elbow showed: 1. Acute minimally displaced intra-articular lateral humeral  epicondyle fracture, with associated joint effusion. 2. Chronic healed radial head fracture unchanged. -Left hip x-ray following:Upper 1 comminuted intertrochanteric left hip fracture, with impaction and varus angulation. -CT head-no acute intracranial process. -CT  cervical spine no subluxation of the cervical spine. -CT elbow of the left: IMPRESSION: 1. Acute, mildly comminuted, intra-articular fracture deformity involving the lateral humeral epicondyle. 2. Chronic, slightly impacted fracture deformity involving  the lateral aspect of the radial head is again noted in appears similar when compared with the plain film radiographs from 08/06/2022. 3. Small joint effusion. 4. Soft tissue edema and hematoma identified overlying the lateral epicondyle of the distal humerus. -CT abdomen pelvis no acute intra-abdominal or intrapelvic traumatic injury.  Chronic diverticulosis.  Aortic atherosclerosis. Plan: -Orthopedic surgery has been consulted while patient in the ED.  Evaluated patient in the ED plan for taking  to OR in the morning. -Left-sided arm  sleep splint in place. -Patient is very sensitive to IV morphine (developing severe hypotension).  Avoiding fentanyl, Dilaudid.  Given patient has CKD stage III avoiding Toradol. -Continue IV Tylenol 1000 mg every 8 hours for 1 day.  Once patient blood pressure will be improved will transition to low-dose oral Tylenol and morphine/fentanyl's/Dilaudid as needed. -Keep patient n.p.o. after midnight - Continue maintenance fluid NS 125 cc/h for 1 day. -Consulted PT and OT for evaluation. -Continue fall precaution. Addendum: -Patient has BMI of only 14.  Based on the pharmacy discussion change adult dose of IV Tylenol to pediatric weight-based dosing IV Tylenol every 6 hours scheduled.  Transient hypotension History of essential hypertension-not on medication at home currently -Echo from 08/2018 showed EF 60 to 65%, aortic regurgitation, calcified mitral valve, and no diastolic abnormality. -At presentation to ED patient blood pressure 117/101, heart rate 93, respiratory rate 26 and O2 sat 95% room air.  However patient blood pressure dropped to 75/39 after 2 mg dose of morphine.  Received 2 L of NS bolus in the ED.  During my evaluation at the bedside patient MAP 54.  Giving another 1 L of NS bolus, 50 g of IV albumin and 10 mg of midodrine.  Afterward we will continue maintenance fluid NS 125 cc/h.  However if patient's MAP still remained below 65 need to transfer to ICU for pressor support. -Initiating midodrine 10 mg 3 times daily.  If patient blood pressure runs high in that case we will reevaluate and stop. - Admitting patient to progressive unit for close follow-up.  History of CVA - Holding aspirin as patient is going for surgery in the a.m.  History of COPD -Patient's O2 sat dropped to 89% on room air. - Continue to check pulse ox and continue supplemental oxygen. -Continue DuoNeb as needed and Dulera twice daily. - Continue supplemental oxygen to keep O2 sat above 92%. - Continue  spirometry.  Generalized anxiety disorder -Not on medication at home currently.  Normocytic anemia -Baseline hemoglobin around 12-13. - Hemoglobin at presentation 10.3.  MCV 96.  Platelet within normal range.  Hemoglobin 12.75 months ago.  Unsure if this acute drop versus gradual drop of hemoglobin over the course of time. - CT abdomen pelvis did not show any evidence of intra-abdominal bleeding.   -Continue to monitor hemoglobin level. - If it drops significantly need to reevaluate with CT abdomen pelvis. Checking anemia panel and starting oral iron supplement.   Leukocytosis-reactive - WBC count 14.9.  Reactive leukocytosis in the setting of fracture and fall. - Patient is afebrile, chest x-ray no evidence of acute inflammatory process.  CT chest abdomen pelvis no acute finding. -Continue to monitor WBC.  Hyperkalemia -Potassium 5.6.  Giving calcium gluconate 1 g and Lokelma 10 g..  Checking EKG.  CKD stage IIIa -Creatinine 1.03.  Which is around patient's baseline.  GFR between 4 to 50. -Continue to monitor renal function.  Avoid nephrotoxic agent.  Hypothyroidism - Continue levothyroxine.   DVT prophylaxis:  SCDs.  Continue heparin 5000 3 times daily for DVT prophylaxis. Code Status:  Full Code.  Discussed and verified with patient's daughter at the bedside Diet: Heart healthy diet.  N.p.o. after midnight Family Communication: Patient's daughter at bedside, at the time of interview.  Opportunity was given to ask question and all questions were answered satisfactorily.  Disposition Plan: Pending clinical disposition Consults: Orthopedic surgeon Admission status:   Inpatient, progressive unit  Severity of Illness: The appropriate patient status for this patient is INPATIENT. Inpatient status is judged to be reasonable and necessary in order to provide the required intensity of service to ensure the patient's safety. The patient's presenting symptoms, physical exam findings,  and initial radiographic and laboratory data in the context of their chronic comorbidities is felt to place them at high risk for further clinical deterioration. Furthermore, it is not anticipated that the patient will be medically stable for discharge from the hospital within 2 midnights of admission.   * I certify that at the point of admission it is my clinical judgment that the patient will require inpatient hospital care spanning beyond 2 midnights from the point of admission due to high intensity of service, high risk for further deterioration and high frequency of surveillance required.Marland Kitchen    Tereasa Coop, MD [facility]  How to contact the [facility] Attending or Consulting provider 7A - 7P or covering provider during after hours 7P -7A, for this patient.  Check the care team in [facility] and look for a) attending/consulting TR[facility] provider listed and b) the [facility] team listed Log into [email or web address] and use [place]'s universal password to access. If you do not have the password, please contact the hospital operator. Locate the [facility] provider you are looking for under [facility] and page to a number that you can be directly reached. If you still have difficulty reaching the provider, please page the [facility] (Director on Call) for the Hospitalists listed on amion for assistance.  09/25/2023, 9:16 PM

## 2023-09-25 NOTE — ED Notes (Signed)
The pts iv has infiltrated  iv team here to see about another line

## 2023-09-25 NOTE — ED Notes (Signed)
Admitting doctor at the bedside 

## 2023-09-25 NOTE — ED Notes (Signed)
Iv team here 

## 2023-09-26 ENCOUNTER — Inpatient Hospital Stay (HOSPITAL_COMMUNITY): Payer: Medicare Other | Admitting: Anesthesiology

## 2023-09-26 ENCOUNTER — Inpatient Hospital Stay (HOSPITAL_COMMUNITY): Payer: Medicare Other

## 2023-09-26 ENCOUNTER — Encounter (HOSPITAL_COMMUNITY)
Admission: EM | Disposition: A | Discharge status: Home / Self Care | Payer: Self-pay | Source: Home / Self Care | Attending: Student

## 2023-09-26 ENCOUNTER — Other Ambulatory Visit: Payer: Self-pay

## 2023-09-26 ENCOUNTER — Encounter (HOSPITAL_COMMUNITY): Payer: Self-pay | Admitting: Internal Medicine

## 2023-09-26 DIAGNOSIS — I2510 Atherosclerotic heart disease of native coronary artery without angina pectoris: Secondary | ICD-10-CM | POA: Diagnosis not present

## 2023-09-26 DIAGNOSIS — S72142A Displaced intertrochanteric fracture of left femur, initial encounter for closed fracture: Secondary | ICD-10-CM

## 2023-09-26 DIAGNOSIS — S72002A Fracture of unspecified part of neck of left femur, initial encounter for closed fracture: Secondary | ICD-10-CM | POA: Diagnosis not present

## 2023-09-26 DIAGNOSIS — I959 Hypotension, unspecified: Secondary | ICD-10-CM | POA: Diagnosis not present

## 2023-09-26 DIAGNOSIS — D62 Acute posthemorrhagic anemia: Secondary | ICD-10-CM

## 2023-09-26 DIAGNOSIS — E875 Hyperkalemia: Secondary | ICD-10-CM | POA: Diagnosis not present

## 2023-09-26 HISTORY — PX: INTRAMEDULLARY (IM) NAIL INTERTROCHANTERIC: SHX5875

## 2023-09-26 LAB — COMPREHENSIVE METABOLIC PANEL
ALT: 18 U/L (ref 0–44)
AST: 24 U/L (ref 15–41)
Albumin: 2.6 g/dL — ABNORMAL LOW (ref 3.5–5.0)
Alkaline Phosphatase: 44 U/L (ref 38–126)
Anion gap: 5 (ref 5–15)
BUN: 21 mg/dL (ref 8–23)
CO2: 20 mmol/L — ABNORMAL LOW (ref 22–32)
Calcium: 7.5 mg/dL — ABNORMAL LOW (ref 8.9–10.3)
Chloride: 109 mmol/L (ref 98–111)
Creatinine, Ser: 1.08 mg/dL — ABNORMAL HIGH (ref 0.44–1.00)
GFR, Estimated: 48 mL/min — ABNORMAL LOW (ref >60)
Glucose, Bld: 120 mg/dL — ABNORMAL HIGH (ref 70–99)
Potassium: 4.2 mmol/L (ref 3.5–5.1)
Sodium: 134 mmol/L — ABNORMAL LOW (ref 135–145)
Total Bilirubin: 0.9 mg/dL (ref 0.3–1.2)
Total Protein: 4.8 g/dL — ABNORMAL LOW (ref 6.5–8.1)

## 2023-09-26 LAB — CBC
HCT: 29.3 % — ABNORMAL LOW (ref 36.0–46.0)
Hemoglobin: 9.5 g/dL — ABNORMAL LOW (ref 12.0–15.0)
MCH: 30.3 pg (ref 26.0–34.0)
MCHC: 32.4 g/dL (ref 30.0–36.0)
MCV: 93.3 fL (ref 80.0–100.0)
Platelets: 83 10*3/uL — ABNORMAL LOW (ref 150–400)
RBC: 3.14 MIL/uL — ABNORMAL LOW (ref 3.87–5.11)
RDW: 18.5 % — ABNORMAL HIGH (ref 11.5–15.5)
WBC: 11.6 10*3/uL — ABNORMAL HIGH (ref 4.0–10.5)
nRBC: 0 % (ref 0.0–0.2)

## 2023-09-26 LAB — POCT I-STAT, CHEM 8
BUN: 23 mg/dL (ref 8–23)
Calcium, Ion: 1.19 mmol/L (ref 1.15–1.40)
Chloride: 104 mmol/L (ref 98–111)
Creatinine, Ser: 1 mg/dL (ref 0.44–1.00)
Glucose, Bld: 105 mg/dL — ABNORMAL HIGH (ref 70–99)
HCT: 32 % — ABNORMAL LOW (ref 36.0–46.0)
Hemoglobin: 10.9 g/dL — ABNORMAL LOW (ref 12.0–15.0)
Potassium: 4.4 mmol/L (ref 3.5–5.1)
Sodium: 139 mmol/L (ref 135–145)
TCO2: 22 mmol/L (ref 22–32)

## 2023-09-26 LAB — GLUCOSE, CAPILLARY
Glucose-Capillary: 121 mg/dL — ABNORMAL HIGH (ref 70–99)
Glucose-Capillary: 89 mg/dL (ref 70–99)
Glucose-Capillary: 120 mg/dL — ABNORMAL HIGH (ref 70–99)

## 2023-09-26 LAB — IRON AND TIBC
Iron: 155 ug/dL (ref 28–170)
Saturation Ratios: 81 % — ABNORMAL HIGH (ref 10.4–31.8)
TIBC: 192 ug/dL — ABNORMAL LOW (ref 250–450)
UIBC: 37 ug/dL

## 2023-09-26 LAB — HEMOGLOBIN AND HEMATOCRIT, BLOOD
HCT: 22.7 % — ABNORMAL LOW (ref 36.0–46.0)
HCT: 24.8 % — ABNORMAL LOW (ref 36.0–46.0)
Hemoglobin: 7.1 g/dL — ABNORMAL LOW (ref 12.0–15.0)
Hemoglobin: 8 g/dL — ABNORMAL LOW (ref 12.0–15.0)

## 2023-09-26 LAB — SURGICAL PCR SCREEN
MRSA, PCR: NEGATIVE
Staphylococcus aureus: POSITIVE — AB

## 2023-09-26 LAB — FOLATE: Folate: 10.9 ng/mL (ref >5.9)

## 2023-09-26 LAB — FERRITIN: Ferritin: 84 ng/mL (ref 11–307)

## 2023-09-26 LAB — VITAMIN D 25 HYDROXY (VIT D DEFICIENCY, FRACTURES): Vit D, 25-Hydroxy: 26.05 ng/mL — ABNORMAL LOW (ref 30–100)

## 2023-09-26 LAB — VITAMIN B12: Vitamin B-12: 241 pg/mL (ref 180–914)

## 2023-09-26 LAB — RETICULOCYTES
Immature Retic Fract: 16.9 % — ABNORMAL HIGH (ref 2.3–15.9)
RBC.: 3.18 MIL/uL — ABNORMAL LOW (ref 3.87–5.11)
Retic Count, Absolute: 57.9 10*3/uL (ref 19.0–186.0)
Retic Ct Pct: 1.8 % (ref 0.4–3.1)

## 2023-09-26 SURGERY — FIXATION, FRACTURE, INTERTROCHANTERIC, WITH INTRAMEDULLARY ROD
Anesthesia: General | Laterality: Left

## 2023-09-26 MED ORDER — CHLORHEXIDINE GLUCONATE 0.12 % MT SOLN
15.0000 mL | Freq: Once | OROMUCOSAL | Status: AC
Start: 2023-09-26 — End: 2023-09-26

## 2023-09-26 MED ORDER — POLYETHYLENE GLYCOL 3350 17 G PO PACK
17.0000 g | PACK | Freq: Every day | ORAL | Status: DC | PRN
  Administered 2023-09-28: 17 g via ORAL
  Filled 2023-09-26: qty 1

## 2023-09-26 MED ORDER — METOCLOPRAMIDE HCL 5 MG/ML IJ SOLN: 5.0000 mg | Freq: Three times a day (TID) | INTRAMUSCULAR | Status: DC | PRN

## 2023-09-26 MED ORDER — POVIDONE-IODINE 10 % EX SWAB
2.0000 | Freq: Once | CUTANEOUS | Status: AC
  Administered 2023-09-26: 2 via TOPICAL

## 2023-09-26 MED ORDER — LIDOCAINE 2% (20 MG/ML) 5 ML SYRINGE
INTRAMUSCULAR | Status: AC
  Filled 2023-09-26: qty 5

## 2023-09-26 MED ORDER — DEXAMETHASONE SODIUM PHOSPHATE 10 MG/ML IJ SOLN
INTRAMUSCULAR | Status: DC | PRN
  Administered 2023-09-26: 10 mg via INTRAVENOUS

## 2023-09-26 MED ORDER — FENTANYL CITRATE (PF) 250 MCG/5ML IJ SOLN
INTRAMUSCULAR | Status: AC
  Filled 2023-09-26: qty 5

## 2023-09-26 MED ORDER — SODIUM CHLORIDE 0.9% IV SOLUTION: Freq: Once | INTRAVENOUS | Status: DC

## 2023-09-26 MED ORDER — LACTATED RINGERS IV SOLN: INTRAVENOUS | Status: DC | PRN

## 2023-09-26 MED ORDER — FENTANYL CITRATE (PF) 250 MCG/5ML IJ SOLN
INTRAMUSCULAR | Status: DC | PRN
  Administered 2023-09-26 (×2): 25 ug via INTRAVENOUS

## 2023-09-26 MED ORDER — ONDANSETRON HCL 4 MG/2ML IJ SOLN
INTRAMUSCULAR | Status: DC | PRN
  Administered 2023-09-26: 4 mg via INTRAVENOUS

## 2023-09-26 MED ORDER — ONDANSETRON HCL 4 MG PO TABS: 4.0000 mg | ORAL_TABLET | Freq: Four times a day (QID) | ORAL | Status: DC | PRN

## 2023-09-26 MED ORDER — CHLORHEXIDINE GLUCONATE 4 % EX SOLN: 60.0000 mL | Freq: Once | CUTANEOUS | Status: DC

## 2023-09-26 MED ORDER — ACETAMINOPHEN 325 MG PO TABS: 325.0000 mg | ORAL_TABLET | Freq: Four times a day (QID) | ORAL | Status: DC | PRN

## 2023-09-26 MED ORDER — ROCURONIUM BROMIDE 10 MG/ML (PF) SYRINGE
PREFILLED_SYRINGE | INTRAVENOUS | Status: DC | PRN
  Administered 2023-09-26: 30 mg via INTRAVENOUS
  Administered 2023-09-26: 20 mg via INTRAVENOUS

## 2023-09-26 MED ORDER — CHLORHEXIDINE GLUCONATE 0.12 % MT SOLN
OROMUCOSAL | Status: AC
  Administered 2023-09-26: 15 mL via OROMUCOSAL
  Filled 2023-09-26: qty 15

## 2023-09-26 MED ORDER — CEFAZOLIN SODIUM-DEXTROSE 2-4 GM/100ML-% IV SOLN
2.0000 g | INTRAVENOUS | Status: AC
  Administered 2023-09-26: 2 g via INTRAVENOUS
  Filled 2023-09-26: qty 100

## 2023-09-26 MED ORDER — CHLORHEXIDINE GLUCONATE CLOTH 2 % EX PADS
6.0000 | MEDICATED_PAD | Freq: Every day | CUTANEOUS | Status: AC
  Administered 2023-09-26 – 2023-09-30 (×5): 6 via TOPICAL

## 2023-09-26 MED ORDER — OXYCODONE HCL 5 MG PO TABS
5.0000 mg | ORAL_TABLET | ORAL | Status: DC | PRN
  Administered 2023-09-27 – 2023-09-30 (×4): 5 mg via ORAL
  Filled 2023-09-26 (×4): qty 1

## 2023-09-26 MED ORDER — ORAL CARE MOUTH RINSE: 15.0000 mL | Freq: Once | OROMUCOSAL | Status: AC

## 2023-09-26 MED ORDER — MUPIROCIN 2 % EX OINT
1.0000 | TOPICAL_OINTMENT | Freq: Two times a day (BID) | CUTANEOUS | Status: AC
  Administered 2023-09-26 – 2023-09-30 (×10): 1 via NASAL
  Filled 2023-09-26 (×2): qty 22

## 2023-09-26 MED ORDER — ASPIRIN 81 MG PO TBEC
81.0000 mg | DELAYED_RELEASE_TABLET | Freq: Every day | ORAL | Status: DC
  Administered 2023-09-27 – 2023-10-01 (×5): 81 mg via ORAL
  Filled 2023-09-26 (×6): qty 1

## 2023-09-26 MED ORDER — FUROSEMIDE 10 MG/ML IJ SOLN
20.0000 mg | Freq: Once | INTRAMUSCULAR | Status: AC
  Administered 2023-09-26: 20 mg via INTRAVENOUS
  Filled 2023-09-26: qty 2

## 2023-09-26 MED ORDER — TRANEXAMIC ACID-NACL 1000-0.7 MG/100ML-% IV SOLN
1000.0000 mg | Freq: Once | INTRAVENOUS | Status: AC
  Administered 2023-09-26: 1000 mg via INTRAVENOUS
  Filled 2023-09-26: qty 100

## 2023-09-26 MED ORDER — SUGAMMADEX SODIUM 200 MG/2ML IV SOLN
INTRAVENOUS | Status: DC | PRN
  Administered 2023-09-26: 150 mg via INTRAVENOUS

## 2023-09-26 MED ORDER — 0.9 % SODIUM CHLORIDE (POUR BTL) OPTIME
TOPICAL | Status: DC | PRN
  Administered 2023-09-26: 1000 mL

## 2023-09-26 MED ORDER — PROPOFOL 10 MG/ML IV BOLUS
INTRAVENOUS | Status: AC
  Filled 2023-09-26: qty 20

## 2023-09-26 MED ORDER — ONDANSETRON HCL 4 MG/2ML IJ SOLN
INTRAMUSCULAR | Status: AC
  Filled 2023-09-26: qty 2

## 2023-09-26 MED ORDER — EPHEDRINE SULFATE-NACL 50-0.9 MG/10ML-% IV SOSY
PREFILLED_SYRINGE | INTRAVENOUS | Status: DC | PRN
  Administered 2023-09-26: 10 mg via INTRAVENOUS

## 2023-09-26 MED ORDER — CEFAZOLIN SODIUM-DEXTROSE 2-4 GM/100ML-% IV SOLN
2.0000 g | Freq: Two times a day (BID) | INTRAVENOUS | Status: AC
  Administered 2023-09-26 – 2023-09-27 (×2): 2 g via INTRAVENOUS
  Filled 2023-09-26 (×2): qty 100

## 2023-09-26 MED ORDER — ONDANSETRON HCL 4 MG/2ML IJ SOLN: 4.0000 mg | Freq: Four times a day (QID) | INTRAMUSCULAR | Status: DC | PRN

## 2023-09-26 MED ORDER — DOCUSATE SODIUM 100 MG PO CAPS
100.0000 mg | ORAL_CAPSULE | Freq: Two times a day (BID) | ORAL | Status: DC
  Administered 2023-09-26 – 2023-09-29 (×5): 100 mg via ORAL
  Filled 2023-09-26 (×5): qty 1

## 2023-09-26 MED ORDER — DEXAMETHASONE SODIUM PHOSPHATE 10 MG/ML IJ SOLN
INTRAMUSCULAR | Status: AC
  Filled 2023-09-26: qty 1

## 2023-09-26 MED ORDER — PROPOFOL 10 MG/ML IV BOLUS
INTRAVENOUS | Status: DC | PRN
  Administered 2023-09-26: 20 mg via INTRAVENOUS
  Administered 2023-09-26: 80 mg via INTRAVENOUS

## 2023-09-26 MED ORDER — METOCLOPRAMIDE HCL 5 MG PO TABS: 5.0000 mg | ORAL_TABLET | Freq: Three times a day (TID) | ORAL | Status: DC | PRN

## 2023-09-26 MED ORDER — LIDOCAINE 2% (20 MG/ML) 5 ML SYRINGE
INTRAMUSCULAR | Status: DC | PRN
  Administered 2023-09-26: 60 mg via INTRAVENOUS

## 2023-09-26 SURGICAL SUPPLY — 54 items
ADH SKN CLS APL DERMABOND .7 (GAUZE/BANDAGES/DRESSINGS) ×1
ADH SKN CLS LQ APL DERMABOND (GAUZE/BANDAGES/DRESSINGS) ×1
APL PRP STRL LF DISP 70% ISPRP (MISCELLANEOUS) ×1
BAG COUNTER SPONGE SURGICOUNT (BAG) IMPLANT
BAG SPNG CNTER NS LX DISP (BAG)
BIT DRILL INTERTAN LAG SCREW (BIT) IMPLANT
BIT DRILL LONG 4.0 (BIT) IMPLANT
BRUSH SCRUB EZ PLAIN DRY (MISCELLANEOUS) ×2 IMPLANT
CHLORAPREP W/TINT 26 (MISCELLANEOUS) ×1 IMPLANT
COVER PERINEAL POST (MISCELLANEOUS) ×1 IMPLANT
COVER SURGICAL LIGHT HANDLE (MISCELLANEOUS) ×1 IMPLANT
DERMABOND ADVANCED .7 DNX12 (GAUZE/BANDAGES/DRESSINGS) ×1 IMPLANT
DERMABOND ADVANCED .7 DNX6 (GAUZE/BANDAGES/DRESSINGS) IMPLANT
DRAPE C-ARM 35X43 STRL (DRAPES) ×1 IMPLANT
DRAPE IMP U-DRAPE 54X76 (DRAPES) ×2 IMPLANT
DRAPE INCISE IOBAN 66X45 STRL (DRAPES) ×1 IMPLANT
DRAPE STERI IOBAN 125X83 (DRAPES) ×1 IMPLANT
DRAPE SURG 17X23 STRL (DRAPES) ×2 IMPLANT
DRAPE U-SHAPE 47X51 STRL (DRAPES) ×1 IMPLANT
DRESSING MEPILEX FLEX 4X4 (GAUZE/BANDAGES/DRESSINGS) ×1 IMPLANT
DRILL BIT LONG 4.0 (BIT) ×1
DRSG MEPILEX FLEX 4X4 (GAUZE/BANDAGES/DRESSINGS) ×1
DRSG MEPILEX POST OP 4X8 (GAUZE/BANDAGES/DRESSINGS) ×1 IMPLANT
DRSG MEPITEL 4X7.2 (GAUZE/BANDAGES/DRESSINGS) IMPLANT
DRSG TEGADERM 4X4.5 CHG (GAUZE/BANDAGES/DRESSINGS) IMPLANT
ELECT REM PT RETURN 9FT ADLT (ELECTROSURGICAL) ×1
ELECTRODE REM PT RTRN 9FT ADLT (ELECTROSURGICAL) ×1 IMPLANT
GAUZE SPONGE 4X4 12PLY STRL (GAUZE/BANDAGES/DRESSINGS) IMPLANT
GLOVE BIO SURGEON STRL SZ 6.5 (GLOVE) ×3 IMPLANT
GLOVE BIO SURGEON STRL SZ7.5 (GLOVE) ×4 IMPLANT
GLOVE BIOGEL PI IND STRL 6.5 (GLOVE) ×1 IMPLANT
GLOVE BIOGEL PI IND STRL 7.5 (GLOVE) ×1 IMPLANT
GOWN STRL REUS W/ TWL LRG LVL3 (GOWN DISPOSABLE) ×1 IMPLANT
GOWN STRL REUS W/TWL LRG LVL3 (GOWN DISPOSABLE) ×1
GUIDE PIN 3.2X343 (PIN) ×2
GUIDE PIN 3.2X343MM (PIN) ×2
KIT BASIN OR (CUSTOM PROCEDURE TRAY) ×1 IMPLANT
KIT TURNOVER KIT B (KITS) ×1 IMPLANT
MANIFOLD NEPTUNE II (INSTRUMENTS) ×1 IMPLANT
NAIL INTERTAN 10X18 130D 10S (Nail) IMPLANT
NS IRRIG 1000ML POUR BTL (IV SOLUTION) ×1 IMPLANT
PACK GENERAL/GYN (CUSTOM PROCEDURE TRAY) ×1 IMPLANT
PAD ARMBOARD 7.5X6 YLW CONV (MISCELLANEOUS) ×2 IMPLANT
PIN GUIDE 3.2X343MM (PIN) IMPLANT
SCREW LAG COMPR KIT 95/90 (Screw) IMPLANT
SCREW TRIGEN LOW PROF 5.0X35 (Screw) IMPLANT
SUT MNCRL AB 3-0 PS2 18 (SUTURE) ×1 IMPLANT
SUT VIC AB 0 CT1 27 (SUTURE)
SUT VIC AB 0 CT1 27XBRD ANBCTR (SUTURE) IMPLANT
SUT VIC AB 2-0 CT1 27 (SUTURE) ×2
SUT VIC AB 2-0 CT1 TAPERPNT 27 (SUTURE) ×2 IMPLANT
SUT VIC AB CT1 27XBRD ANBCTRL (SUTURE)
TOWEL GREEN STERILE (TOWEL DISPOSABLE) ×2 IMPLANT
WATER STERILE IRR 1000ML POUR (IV SOLUTION) ×1 IMPLANT

## 2023-09-26 NOTE — Plan of Care (Signed)

## 2023-09-26 NOTE — Progress Notes (Signed)
OT Cancellation Note  Patient Details Name: Holly [name] MRN: [id number] DOB: 08/27/1928   Cancelled Treatment:    Reason Eval/Treat Not Completed: Patient not medically ready. Plan for likely IM nailing of L hip fx. Will follow up after.  Tyler Deis, OTR/L [facility] Acute Rehabilitation Office: [phone]   Holly [name] 09/26/2023, 10:07 AM

## 2023-09-26 NOTE — Anesthesia Postprocedure Evaluation (Signed)
Anesthesia Post Note  Patient: Holly [name]  Procedure(s) Performed: INTRAMEDULLARY (IM) NAIL INTERTROCHANTERIC (Left)     Patient location during evaluation: PACU Anesthesia Type: General Level of consciousness: awake and alert Pain management: pain level controlled Vital Signs Assessment: post-procedure vital signs reviewed and stable Respiratory status: spontaneous breathing, nonlabored ventilation, respiratory function stable and patient connected to nasal cannula oxygen Cardiovascular status: blood pressure returned to baseline and stable Postop Assessment: no apparent nausea or vomiting Anesthetic complications: no   No notable events documented.  Last Vitals:  Vitals:   09/26/23 1008 09/26/23 1211  BP: (!) 156/44   Pulse: 70 79  Resp: 18 10  Temp: 37 C   SpO2: 92%     Last Pain:  Vitals:   09/26/23 0923  TempSrc: Oral  PainSc:                  Morse Brueggemann

## 2023-09-26 NOTE — Progress Notes (Signed)
CCC Pre-op Review  Pre-op checklist: To be completed by bedside RN  NPO: Yes  Labs: K+ 5.6-pt given lokelma in ED. Erin get in touch with lab on the unit to get it drawn ASAP. +Staph-Mupirocin to be given at 1000  Consent: Sign by daughter.   H&P: Hospitalist  Vitals: BP soft  O2 requirements: RA  MAR/PTA review: Home ASA. No BB. No GLP  IV: 20g  Floor nurse name:  Dortha Kern, RN   Additional info:  2 units of PRBCs. Last unit just finished. H&H to be drawn at 1130 per Denny Peon, RN Tele

## 2023-09-26 NOTE — Anesthesia Preprocedure Evaluation (Addendum)
Anesthesia Evaluation  Patient identified by MRN, date of birth, ID band Patient awake    Reviewed: Allergy & Precautions, NPO status , Patient's Chart, lab work & pertinent test results  Airway Mallampati: II  TM Distance: >3 FB Neck ROM: Full    Dental  (+) Dental Advisory Given, Edentulous Upper, Poor Dentition, Missing   Pulmonary COPD, former smoker   breath sounds clear to auscultation       Cardiovascular hypertension, + CAD, + Peripheral Vascular Disease and +CHF  + Valvular Problems/Murmurs AS  Rhythm:Regular Rate:Normal  08/2018 Echo. Normal EF. Mild AS   Neuro/Psych CVA    GI/Hepatic Neg liver ROS,GERD  ,,  Endo/Other  Hypothyroidism    Renal/GU negative Renal ROS     Musculoskeletal   Abdominal   Peds  Hematology  (+) Blood dyscrasia, anemia   Anesthesia Other Findings   Reproductive/Obstetrics                             Anesthesia Physical Anesthesia Plan  ASA: 4 and emergent  Anesthesia Plan: General   Post-op Pain Management: Minimal or no pain anticipated and Ofirmev IV (intra-op)*   Induction: Intravenous  PONV Risk Score and Plan: 3 and Dexamethasone, Ondansetron and Treatment may vary due to age or medical condition  Airway Management Planned: Oral ETT  Additional Equipment: None  Intra-op Plan:   Post-operative Plan: Extubation in OR  Informed Consent: I have reviewed the patients History and Physical, chart, labs and discussed the procedure including the risks, benefits and alternatives for the proposed anesthesia with the patient or authorized representative who has indicated his/her understanding and acceptance.    Discussed DNR with power of attorney, Discussed DNR with patient and Continue DNR.   Dental advisory given  Plan Discussed with: CRNA and Anesthesiologist  Anesthesia Plan Comments: (Long discussion with Patient in presence of  granddaughter who is the POA. Exclude chest compressions. OK with intubation and cardiac meds)        Anesthesia Quick Evaluation

## 2023-09-26 NOTE — Interval H&P Note (Signed)
History and Physical Interval Note:  09/26/2023 10:30 AM  Holly [name]  has presented today for surgery, with the diagnosis of Left hip fracture.  The various methods of treatment have been discussed with the patient and family. After consideration of risks, benefits and other options for treatment, the patient has consented to  Procedure(s): INTRAMEDULLARY (IM) NAIL INTERTROCHANTERIC (Left) as a surgical intervention.  The patient's history has been reviewed, patient examined, no change in status, stable for surgery.  I have reviewed the patient's chart and labs.  Questions were answered to the patient's satisfaction.     Caryn Bee P Algie Cales

## 2023-09-26 NOTE — Consult Note (Signed)
Orthopaedic Trauma Service (OTS) Consult   Patient ID: Holly [name] MRN: [id number] DOB/AGE: Apr 04, 1928 89 y.o.  Reason for Consult:Left hip fracture and left distal humerus fracture Referring Physician: Dr. Jackquline Bosch, MD Holly [name]  HPI: Holly [name] is an 89 y.o. female who is being seen in consultation at the request of Dr. Santa Genera for left hip fracture and left distal humerus fracture.  Patient had a ground-level fall and sustained of the above injuries.  She was admitted for surgery.  Her hemoglobin dropped significantly and was transfused 2 units.  She otherwise is doing well only complains of left hip pain and left elbow pain.  Her granddaughter is at bedside currently.  Patient ambulates at baseline.  She denies any blood thinners.  Past Medical History:  Diagnosis Date   CAD (coronary artery disease)    CELLULITIS 06/18/2008   CHF (congestive heart failure) (HCC)    CHRONIC OBSTRUCTIVE PULMONARY DISEASE, ACUTE EXACERBATION 11/16/2007   COPD 05/18/2007   COVID-19    GERD 12/25/2007   HYPERLIPIDEMIA 05/18/2007   HYPOTHYROIDISM 05/18/2007   Macular degeneration    Stroke (HCC) 08/2018   Urosepsis     Past Surgical History:  Procedure Laterality Date   CATARACT EXTRACTION     DILATION AND CURETTAGE OF UTERUS     KYPHOPLASTY N/A 05/02/2020   Procedure: Thoracic eleven KYPHOPLASTY;  Surgeon: Coletta Memos, MD;  Location: [facility] OR;  Service: Neurosurgery;  Laterality: N/A;  3C   TEE WITHOUT CARDIOVERSION N/A 09/01/2018   Procedure: TRANSESOPHAGEAL ECHOCARDIOGRAM (TEE);  Surgeon: Jodelle Red, MD;  Location: [facility] ENDOSCOPY;  Service: Cardiovascular;  Laterality: N/A;   VARICOSE VEIN SURGERY      Family History  Problem Relation Age of Onset   Ovarian cancer Mother    Diabetes Mellitus II Brother     Social History:  reports that she quit smoking about 37 years ago. Her smoking use included cigarettes. She started smoking about 77 years ago. She has never used  smokeless tobacco. She reports that she does not drink alcohol and does not use drugs.  Allergies:  Allergies  Allergen Reactions   Levofloxacin     Unknown reaction   Macrobid [Nitrofurantoin Macrocrystal] Nausea Only   Sulfamethoxazole     Unknown reaction   Tramadol Other (See Comments)    Shakes    Amoxicillin Rash    Has patient had a PCN reaction causing immediate rash, facial/tongue/throat swelling, SOB or lightheadedness with hypotension: No Has patient had a PCN reaction causing severe rash involving mucus membranes or skin necrosis: No Has patient had a PCN reaction that required hospitalization: No Has patient had a PCN reaction occurring within the last 10 years: No If all of the above answers are "NO", then may proceed with Cephalosporin use.   Made "bottom area" very raw    Medications: I have reviewed the patient's current medications.  ROS: Constitutional: No fever or chills Vision: No changes in vision ENT: No difficulty swallowing CV: No chest pain Pulm: No SOB or wheezing GI: No nausea or vomiting GU: No urgency or inability to hold urine Skin: No poor wound healing Neurologic: No numbness or tingling Psychiatric: No depression or anxiety Heme: No bruising Allergic: No reaction to medications or food   Exam: Blood pressure (!) 156/44, pulse 70, temperature 98.6 F (37 C), resp. rate 18, height 5\' 2"  (1.575 m), weight 44.1 kg, SpO2 92%. General: No acute distress Orientation: Awake alert and oriented x 3 Mood and Affect:  Cooperative and pleasant Gait: Unable to assess due to her fractures Coordination and balance: Within normal limits   Left lower extremity: Leg is shortened and externally rotated.  There is moderate edema in the lower extremity.  She is warm well-perfused foot.  Compartments are soft and compressible.  She has active dorsiflexion plantarflexion of foot and ankle.  She has sensation grossly intact to light touch.  Left upper  extremity: Long-arm splint is in place is clean dry and intact.  Compartments are soft compressible.  She has intact motor and sensory function to median, radial and ulnar nerve distribution.  She is warm well-perfused hand with brisk cap refill.  Right upper extremity right lower extremity: Skin without lesions. No tenderness to palpation. Full painless ROM, full strength in each muscle groups without evidence of instability.   Medical Decision Making: Data: Imaging: X-rays of the left hip show a displaced varus angulated intertrochanteric femur fracture.  X-rays and CT scan of the distal humerus and elbow are reviewed which shows a lateral condyle fracture with intra-articular extension.  Significant comminution along the lateral cortex.  Overall alignment of the capitellum and the trochlea maintained on.  Labs:  Results for orders placed or performed during the hospital encounter of 09/25/23 (from the past 24 hour(s))  Basic metabolic panel     Status: Abnormal   Collection Time: 09/25/23  4:30 PM  Result Value Ref Range   Sodium 139 135 - 145 mmol/L   Potassium 5.6 (H) 3.5 - 5.1 mmol/L   Chloride 104 98 - 111 mmol/L   CO2 24 22 - 32 mmol/L   Glucose, Bld 125 (H) 70 - 99 mg/dL   BUN 25 (H) 8 - 23 mg/dL   Creatinine, Ser 8.65 (H) 0.44 - 1.00 mg/dL   Calcium 8.7 (L) 8.9 - 10.3 mg/dL   GFR, Estimated 51 (L) >60 mL/min   Anion gap 11 5 - 15  CBC     Status: Abnormal   Collection Time: 09/25/23  5:21 PM  Result Value Ref Range   WBC 14.9 (H) 4.0 - 10.5 K/uL   RBC 3.27 (L) 3.87 - 5.11 MIL/uL   Hemoglobin 10.3 (L) 12.0 - 15.0 g/dL   HCT 78.4 (L) 69.6 - 29.5 %   MCV 96.6 80.0 - 100.0 fL   MCH 31.5 26.0 - 34.0 pg   MCHC 32.6 30.0 - 36.0 g/dL   RDW 28.4 (H) 13.2 - 44.0 %   Platelets 169 150 - 400 K/uL   nRBC 0.0 0.0 - 0.2 %  ABO/Rh     Status: None   Collection Time: 09/25/23  5:21 PM  Result Value Ref Range   ABO/RH(D)      O POS Performed at [facility], [address] [place], [place] [address]   Hemoglobin and hematocrit, blood     Status: Abnormal   Collection Time: 09/25/23  8:38 PM  Result Value Ref Range   Hemoglobin 7.9 (L) 12.0 - 15.0 g/dL   HCT 53.6 (L) 64.4 - 03.4 %  CK     Status: None   Collection Time: 09/25/23  8:38 PM  Result Value Ref Range   Total CK 88 38 - 234 U/L  Hepatic function panel     Status: Abnormal   Collection Time: 09/25/23  8:38 PM  Result Value Ref Range   Total Protein 6.2 (L) 6.5 - 8.1 g/dL   Albumin 3.4 (L) 3.5 - 5.0 g/dL   AST 29  15 - 41 U/L   ALT 15 0 - 44 U/L   Alkaline Phosphatase 59 38 - 126 U/L   Total Bilirubin 1.1 0.3 - 1.2 mg/dL   Bilirubin, Direct 0.3 (H) 0.0 - 0.2 mg/dL   Indirect Bilirubin 0.8 0.3 - 0.9 mg/dL  Type and screen [facility]     Status: None (Preliminary result)   Collection Time: 09/25/23 11:40 PM  Result Value Ref Range   ABO/RH(D) O POS    Antibody Screen NEG    Sample Expiration 09/28/2023,2359    Unit Number [id number]    Blood Component Type RBC LR PHER2    Unit division 00    Status of Unit ISSUED    Transfusion Status OK TO TRANSFUSE    Crossmatch Result Compatible    Unit Number [id number]    Blood Component Type RED CELLS,LR    Unit division 00    Status of Unit ISSUED    Transfusion Status OK TO TRANSFUSE    Crossmatch Result      Compatible Performed at [facility], [address] [place], [place] [address]    Unit Number [id number]    Blood Component Type RED CELLS,LR    Unit division 00    Status of Unit ALLOCATED    Transfusion Status OK TO TRANSFUSE    Crossmatch Result Compatible   Hemoglobin and hematocrit, blood     Status: Abnormal   Collection Time: 09/25/23 11:47 PM  Result Value Ref Range   Hemoglobin 7.1 (L) 12.0 - 15.0 g/dL   HCT 32.4 (L) 40.1 - 02.7 %  Prepare RBC (crossmatch)     Status: None   Collection Time: 09/25/23 11:47 PM  Result Value Ref Range   Order Confirmation      ORDER PROCESSED BY BLOOD  BANK Performed at [facility], [address] [place], [place] [address]   CBG monitoring, ED     Status: Abnormal   Collection Time: 09/25/23 11:52 PM  Result Value Ref Range   Glucose-Capillary 180 (H) 70 - 99 mg/dL  Surgical pcr screen     Status: Abnormal   Collection Time: 09/26/23  2:35 AM   Specimen: Nasal Mucosa; Nasal Swab  Result Value Ref Range   MRSA, PCR NEGATIVE NEGATIVE   Staphylococcus aureus POSITIVE (A) NEGATIVE  Glucose, capillary     Status: Abnormal   Collection Time: 09/26/23  6:05 AM  Result Value Ref Range   Glucose-Capillary 121 (H) 70 - 99 mg/dL   Comment 1 Notify RN    Comment 2 Document in Chart   Glucose, capillary     Status: None   Collection Time: 09/26/23 10:12 AM  Result Value Ref Range   Glucose-Capillary 89 70 - 99 mg/dL     Imaging or Labs ordered: None  Medical history and chart was reviewed and case discussed with medical provider.  Assessment/Plan: 89 year old female with left intertrochanteric femur fracture and left lateral condyle distal humerus fracture with intra-articular extension.  Due to the unstable nature of her left hip I recommend proceeding with cephalomedullary nailing of her left hip.  Risks and benefits were discussed with the patient and her family.  Risks include but not limited to bleeding, infection, malunion, nonunion, hardware failure, hardware irritation, nerve and blood vessel injury, DVT, they agreed to proceed with surgery and consent was obtained.  In regards to the distal humerus architecture is well aligned.  She does have an intra-articular split.  Due to her bone quality and the distal nature of her fracture and the comminution makes me feel that reliable fixation is likely.  Also with intra-articular nature she would likely need an osteotomy which at this age with the high morbidity.  Recommend nonoperative management with long-arm splinting for 1 to 2 weeks with potential transition to gentle range of  motion versus casting versus bracing.  There is always the option of potential total elbow arthroplasty if she goes on to develop significant posttraumatic arthritis.  Roby Lofts, MD [facility] [phone] (office) [email or web address]

## 2023-09-26 NOTE — Progress Notes (Signed)
PT Cancellation Note  Patient Details Name: Holly [name] MRN: [id number] DOB: [birth year]   Cancelled Treatment:    Reason Eval/Treat Not Completed: Patient not medically ready; noted pt likely for IM nail fixation of L hip fracture.  Will follow up for mobility once plans for intervention determined per ortho.   [name] 09/26/2023, 9:34 AM [name], PT Acute Rehabilitation Services Office:[phone] 09/26/2023

## 2023-09-26 NOTE — Op Note (Signed)
Orthopaedic Surgery Operative Note (CSN: [id number] ) Date of Surgery: 09/26/2023  Admit Date: 09/25/2023   Diagnoses: Pre-Op Diagnoses: Left intertrochanteric femur fracture  Post-Op Diagnosis: Same  Procedures: CPT 27245-Cephalomedullary nailing of left intertrochanteric femur fracture  Surgeons : Primary: Roby Lofts, MD  Assistant: Ulyses Southward, PA-C  Location: OR 3   Anesthesia: General   Antibiotics: Ancef 2g preop   Tourniquet time: none    Estimated Blood Loss: 30 mL  Complications:* No complications entered in OR log *   Specimens:* No specimens in log *   Implants: Implant Name Type Inv. Item Serial No. Manufacturer Lot No. LRB No. Used Action  NAIL INTERTAN 10X18 130D 10S - [id number] Nail NAIL INTERTAN 10X18 130D 10S  [employer] [id number] Left 1 Implanted  SCREW LAG COMPR KIT 95/90 - [id number] Screw SCREW LAG COMPR KIT 95/90  [employer] [id number] Left 1 Implanted  SCREW TRIGEN LOW PROF 5.0X35 - [id number] Screw SCREW TRIGEN LOW PROF 5.0X35  [employer] [id number] Left 1 Implanted     Indications for Surgery: 89 year old female who sustained a ground-level fall with a left intertrochanteric femur fracture.  Due to the unstable nature of her injury I recommend proceeding with a cephalomedullary nailing of the left hip.  Risks and benefits were discussed with the patient and her family.  Risks included but not limited to bleeding, infection, malunion, nonunion, hardware failure, hardware irritation, nerve and blood vessel injury, DVT, even the possibility anesthetic complications.  She also had a left lateral condyle fracture of distal humerus.  Due to her age and bone quality I felt that nonoperative management was most appropriate.  We would plan to treat that nonoperatively.  Operative Findings: Cephalomedullary nailing of left intertrochanteric femur fracture using [employer] InterTAN 10 x 180 mm nail  with a 95 mm lag screw and a 90 mm compression screw.  Procedure: The patient was identified in the preoperative holding area. Consent was confirmed with the patient and their family and all questions were answered. The operative extremity was marked after confirmation with the patient. she was then brought back to the operating room by our anesthesia colleagues.  She was placed under general anesthetic and carefully transferred over to a Hana table.  All bony prominences were well-padded.  Traction was applied to the left lower extremity and fluoroscopic imaging showed adequate alignment of the fracture.  The left lower extremity was then prepped and draped in usual sterile fashion.  A timeout was performed to verify the patient, the procedure, and the extremity.  Preoperative antibiotics were dosed.  A small incision proximal to the greater trochanter was made and carried down through skin and subcutaneous tissue.  A threaded guidewire was placed the tip of the greater trochanter advanced into the proximal metaphysis.  An entry reamer was used to enter the medullary canal.  A 10 x 180 mm nail attached to a targeting arm was placed down the center of the canal.  I then directed a threaded guidewire up into the head/neck segment of the femoral head.  I confirmed adequate tip apex distance and then measured the length and chose to use a 95 mm lag screw.  I drilled the path for the compression screw and placed an antirotation bar.  I then drilled the path for the lag screw and placed the 95 mm lag screw.  I then placed a compression screw and compressed approximately 4 to 5 mm.  I statically  locked the proximal portion of the nail.  I then used the targeting arm to place a distal interlocking screw from lateral to medial.  Targeting arm was removed and final fluoroscopic imaging was obtained.  The incisions were irrigated and closed with 2-0 Monocryl and Dermabond.  Sterile dressings were applied.  The patient  was awoken from anesthesia and taken to the PACU in stable condition.  Post Op Plan/Instructions: Patient will be weightbearing as tolerated to left lower extremity.  She may use a platform on her left upper extremity to help assist with weightbearing through the left elbow.  She will receive postoperative Ancef.  She will be placed on aspirin for DVT prophylaxis.  We will have her mobilize with physical and Occupational Therapy.  I was present and performed the entire surgery.  Ulyses Southward, PA-C did assist me throughout the case. An assistant was necessary given the difficulty in approach, maintenance of reduction and ability to instrument the fracture.   Truitt Merle, MD [facility]

## 2023-09-26 NOTE — Progress Notes (Signed)
[facility] PROGRESS NOTE   Holly [name] MWN:[id number] DOB: 1927/12/31 DOA: 09/25/2023  PCP: Ardith Dark, MD  Brief History: 89 y.o. female with medical history significant for COPD, seborrheic keratosis, hypothyroidism, generalized anxiety disorder, and hyperlipidemia presented to emergency department for evaluation after sustaining a fall at home.  Found to have left hip fracture as well as injury to the distal humerus.  Hospitalized for further management.    Consultants: Orthopedics  Procedures: None yet    Subjective/Interval History: Patient is very hard of hearing.  Denies any pain while lying on the bed.  Her 2 daughters are at the bedside.    Assessment/Plan:  Hypotension Noted to have low blood pressures overnight.  Attributed to morphine that was given for pain.  Blood pressures have stabilized.  She remains asymptomatic.  Midodrine was initiated.  Not on any antihypertensive medications.  Left hip fracture Management per orthopedics.  Remains a moderate to high surgical risk considering her advanced age but may proceed to surgery without any further testing. Pain control.  Left humeral epicondylar fracture Management per orthopedics.  Acute blood loss anemia Hemoglobin was 10.3 when she was initially evaluated.  Dropped down to 7.1 overnight.  2 units of PRBC ordered.  Drop in hemoglobin likely due to hip fracture.  No other overt bleeding noted.  Recheck hemoglobin posttransfusion.  History of stroke Holding aspirin.  History of COPD Continue inhalers.  Supplemental oxygen.  Generalized anxiety disorder Stable.  Leukocytosis Likely reactive.  Hyperkalemia She was given calcium gluconate and Lokelma overnight.  Recheck labs today.  Chronic kidney disease stage IIIa Stable.  Monitor urine output.  Avoid nephrotoxic agents.  Hypothyroidism Continue levothyroxine.   DVT Prophylaxis: Definitive prophylaxis after surgery Code Status:  Full code Family Communication: Discussed with her 2 daughters Disposition Plan: To be determined  Status is: Inpatient Remains inpatient appropriate because: Left hip fracture, blood loss anemia      Medications: Scheduled:  sodium chloride   Intravenous Once   sodium chloride   Intravenous Once   [START ON 09/27/2023] atorvastatin  40 mg Oral Daily   chlorhexidine  60 mL Topical Once   Chlorhexidine Gluconate Cloth  6 each Topical Q0600   ferrous sulfate  325 mg Oral Q breakfast   levothyroxine  112 mcg Oral Q0600   midodrine  10 mg Oral TID WC   mometasone-formoterol  2 puff Inhalation BID   mupirocin ointment  1 Application Nasal BID   povidone-iodine  2 Application Topical Once   sodium chloride flush  3 mL Intravenous Q12H   Continuous:  sodium chloride 125 mL/hr at 09/26/23 0137   sodium chloride     acetaminophen 1,000 mg (09/26/23 0142)    ceFAZolin (ANCEF) IV     QIH:KVQQVZ chloride, bisacodyl, dextrose, ipratropium-albuterol, morphine injection, ondansetron **OR** ondansetron (ZOFRAN) IV, sodium chloride flush  Antibiotics: Anti-infectives (From admission, onward)    Start     Dose/Rate Route Frequency Ordered Stop   09/26/23 1500  ceFAZolin (ANCEF) IVPB 2g/100 mL premix        2 g 200 mL/hr over 30 Minutes Intravenous On call to O.R. 09/26/23 0044 09/27/23 0559       Objective:  Vital Signs  Vitals:   09/26/23 0545 09/26/23 0618 09/26/23 0805 09/26/23 0818  BP: (!) 107/52 (!) 106/50 (!) 111/53   Pulse: 68 69 68   Resp: 16 14 15    Temp: 97.7 F (36.5 C) 97.6 F (36.4 C) 97.8 F (36.6  C)   TempSrc: Oral Axillary Axillary   SpO2: 91%  92% 92%  Weight:      Height:        Intake/Output Summary (Last 24 hours) at 09/26/2023 0847 Last data filed at 09/26/2023 0530 Gross per 24 hour  Intake 1573.96 ml  Output --  Net 1573.96 ml   Filed Weights   09/25/23 1542 09/26/23 0020  Weight: 36.3 kg 44.1 kg    General appearance: Awake alert.  In  no distress.  Distracted.  Very hard of hearing. Resp: Clear to auscultation bilaterally.  Normal effort Cardio: S1-S2 is normal regular.  No S3-S4.  No rubs murmurs or bruit GI: Abdomen is soft.  Nontender nondistended.  Bowel sounds are present normal.  No masses organomegaly Extremities: Left upper extremity covered in wrap.  Left lower extremity externally rotated. Neurologic: No focal neurological deficits.    Lab Results:  Data Reviewed: I have personally reviewed following labs and reports of the imaging studies  CBC: Recent Labs  Lab 09/25/23 1721 09/25/23 2038 09/25/23 2347  WBC 14.9*  --   --   HGB 10.3* 7.9* 7.1*  HCT 31.6* 25.5* >75.0*  MCV 96.6  --   --   PLT 169  --   --     Basic Metabolic Panel: Recent Labs  Lab 09/25/23 1630  NA 139  K 5.6*  CL 104  CO2 24  GLUCOSE 125*  BUN 25*  CREATININE 1.03*  CALCIUM 8.7*    GFR: Estimated Creatinine Clearance: 23.8 mL/min (A) (by C-G formula based on SCr of 1.03 mg/dL (H)).  Liver Function Tests: Recent Labs  Lab 09/25/23 2038  AST 29  ALT 15  ALKPHOS 59  BILITOT 1.1  PROT 6.2*  ALBUMIN 3.4*    Cardiac Enzymes: Recent Labs  Lab 09/25/23 2038  CKTOTAL 88    CBG: Recent Labs  Lab 09/25/23 2352 09/26/23 0605  GLUCAP 180* 121*    Recent Results (from the past 240 hour(s))  Surgical pcr screen     Status: Abnormal   Collection Time: 09/26/23  2:35 AM   Specimen: Nasal Mucosa; Nasal Swab  Result Value Ref Range Status   MRSA, PCR NEGATIVE NEGATIVE Final   Staphylococcus aureus POSITIVE (A) NEGATIVE Final    Comment: (NOTE) The Xpert SA Assay (FDA approved for NASAL specimens in patients 47 years of age and older), is one component of a comprehensive surveillance program. It is not intended to diagnose infection nor to guide or monitor treatment. Performed at [facility], [address] [place], [place] [address]       Radiology Studies: CT ABDOMEN PELVIS WO  CONTRAST  Result Date: 09/25/2023 CLINICAL DATA:  Abdominal trauma, blunt.  Hypotensive EXAM: CT ABDOMEN AND PELVIS WITHOUT CONTRAST TECHNIQUE: Multidetector CT imaging of the abdomen and pelvis was performed following the standard protocol without IV contrast. RADIATION DOSE REDUCTION: This exam was performed according to the departmental dose-optimization program which includes automated exposure control, adjustment of the mA and/or kV according to patient size and/or use of iterative reconstruction technique. COMPARISON:  None Available. FINDINGS: Lower chest: Bronchial wall thickening. Aortic valve leaflet and four-vessel coronary calcification. Enlarged heart. Cardiac changes suggestive of anemia. Hepatobiliary: Not enlarged. No focal lesion. The gallbladder is otherwise unremarkable with no radio-opaque gallstones. No biliary ductal dilatation. Pancreas: Normal pancreatic contour. No main pancreatic duct dilatation. Spleen: Not enlarged. No focal lesion. Adrenals/Urinary Tract: No nodularity bilaterally. No hydroureteronephrosis. No nephroureterolithiasis. No contour deforming  renal mass. The urinary bladder is unremarkable. Stomach/Bowel: Slightly limited evaluation due to motion artifact. Stomach is within normal limits. No evidence of bowel wall thickening or dilatation. Colonic diverticulosis. The appendix is not definitely identified with no inflammatory changes in the right lower quadrant to suggest acute appendicitis. Vasculature/Lymphatic: No abdominal aorta or iliac aneurysm. No abdominal, pelvic, inguinal lymphadenopathy. Reproductive: Normal. Other: No simple free fluid ascites. No pneumoperitoneum. No mesenteric hematoma identified. No organized fluid collection. Musculoskeletal: Right inguinal region femoral vessel coiling. No significant soft tissue hematoma. Acute comminuted and displaced left femoral intertrochanteric fracture. Acute left superior pubic rami minimally displaced fracture. No  spinal fracture. Diffusely decreased bone density. Partially visualized acute comminuted and displaced left humeral supracondylar fracture. Ports and Devices: None. IMPRESSION: 1. Acute comminuted and displaced left femoral intertrochanteric fracture. 2. Acute left superior pubic rami minimally displaced fracture. 3. Partially visualized acute comminuted and displaced left humeral supracondylar fracture. Ports and Devices: None. Finding better evaluated on left elbow radiograph 09/25/2023. 4. No acute intra-abdominal or intrapelvic traumatic injury with mid evaluation on this noncontrast study with motion artifact. 5. No acute fracture or traumatic malalignment of the lumbar spine. Other imaging findings of potential clinical significance: 1. Colonic diverticulosis with no acute diverticulitis. 2. Aortic Atherosclerosis (ICD10-I70.0) including aortic valve leaflet and four-vessel coronary artery calcification. Electronically Signed   By: Tish Frederickson M.D.   On: 09/25/2023 20:39   CT ELBOW LEFT WO CONTRAST  Result Date: 09/25/2023 CLINICAL DATA:  Left elbow fracture. EXAM: CT OF THE UPPER LEFT EXTREMITY WITHOUT CONTRAST TECHNIQUE: Multidetector CT imaging of the upper left extremity was performed according to the standard protocol. RADIATION DOSE REDUCTION: This exam was performed according to the departmental dose-optimization program which includes automated exposure control, adjustment of the mA and/or kV according to patient size and/or use of iterative reconstruction technique. COMPARISON:  Radiograph of the left elbow from earlier today. FINDINGS: Bones/Joint/Cartilage There is an acute, mildly comminuted, intra-articular fracture deformity involving the lateral humeral epicondyle. Slight impaction of the fracture fragments identified. Chronic, slightly impacted fracture deformity involving the lateral aspect of the radial head is again noted in appears similar when compared with the plain film  radiographs from 08/06/2022. Ligaments Suboptimally assessed by CT. Muscles and Tendons No mass identified. Soft tissues Small joint effusion. Soft tissue edema and hematoma identified overlying the lateral epicondyle of the distal humerus. IMPRESSION: 1. Acute, mildly comminuted, intra-articular fracture deformity involving the lateral humeral epicondyle. 2. Chronic, slightly impacted fracture deformity involving the lateral aspect of the radial head is again noted in appears similar when compared with the plain film radiographs from 08/06/2022. 3. Small joint effusion. 4. Soft tissue edema and hematoma identified overlying the lateral epicondyle of the distal humerus. Electronically Signed   By: Signa Kell M.D.   On: 09/25/2023 18:43   CT Head Wo Contrast  Result Date: 09/25/2023 CLINICAL DATA:  Fall, head and neck trauma EXAM: CT HEAD WITHOUT CONTRAST CT CERVICAL SPINE WITHOUT CONTRAST TECHNIQUE: Multidetector CT imaging of the head and cervical spine was performed following the standard protocol without intravenous contrast. Multiplanar CT image reconstructions of the cervical spine were also generated. RADIATION DOSE REDUCTION: This exam was performed according to the departmental dose-optimization program which includes automated exposure control, adjustment of the mA and/or kV according to patient size and/or use of iterative reconstruction technique. COMPARISON:  MR brain, 04/16/2022 FINDINGS: CT HEAD FINDINGS Brain: No evidence of acute infarction, hemorrhage, hydrocephalus, extra-axial collection or mass lesion/mass effect. Periventricular  and deep white matter hypodensity. Vascular: No hyperdense vessel or unexpected calcification. Skull: Normal. Negative for fracture or focal lesion. Sinuses/Orbits: No acute finding. Other: None. CT CERVICAL SPINE FINDINGS Alignment: Normal. Skull base and vertebrae: No acute fracture. No primary bone lesion or focal pathologic process. Soft tissues and spinal  canal: No prevertebral fluid or swelling. No visible canal hematoma. Disc levels: Mild multilevel disc space height loss and osteophytosis throughout the cervical spine. Upper chest: Negative. Other: None. IMPRESSION: 1. No acute intracranial pathology. Small-vessel white matter disease. 2. No fracture or static subluxation of the cervical spine. 3. Mild multilevel cervical disc degenerative disease. Electronically Signed   By: Jearld Lesch M.D.   On: 09/25/2023 17:16   CT Cervical Spine Wo Contrast  Result Date: 09/25/2023 CLINICAL DATA:  Fall, head and neck trauma EXAM: CT HEAD WITHOUT CONTRAST CT CERVICAL SPINE WITHOUT CONTRAST TECHNIQUE: Multidetector CT imaging of the head and cervical spine was performed following the standard protocol without intravenous contrast. Multiplanar CT image reconstructions of the cervical spine were also generated. RADIATION DOSE REDUCTION: This exam was performed according to the departmental dose-optimization program which includes automated exposure control, adjustment of the mA and/or kV according to patient size and/or use of iterative reconstruction technique. COMPARISON:  MR brain, 04/16/2022 FINDINGS: CT HEAD FINDINGS Brain: No evidence of acute infarction, hemorrhage, hydrocephalus, extra-axial collection or mass lesion/mass effect. Periventricular and deep white matter hypodensity. Vascular: No hyperdense vessel or unexpected calcification. Skull: Normal. Negative for fracture or focal lesion. Sinuses/Orbits: No acute finding. Other: None. CT CERVICAL SPINE FINDINGS Alignment: Normal. Skull base and vertebrae: No acute fracture. No primary bone lesion or focal pathologic process. Soft tissues and spinal canal: No prevertebral fluid or swelling. No visible canal hematoma. Disc levels: Mild multilevel disc space height loss and osteophytosis throughout the cervical spine. Upper chest: Negative. Other: None. IMPRESSION: 1. No acute intracranial pathology. Small-vessel  white matter disease. 2. No fracture or static subluxation of the cervical spine. 3. Mild multilevel cervical disc degenerative disease. Electronically Signed   By: Jearld Lesch M.D.   On: 09/25/2023 17:16   DG HIP UNILAT WITH PELVIS 2-3 VIEWS LEFT  Result Date: 09/25/2023 CLINICAL DATA:  Larey Seat, left hip pain EXAM: DG HIP (WITH OR WITHOUT PELVIS) 2-3V LEFT COMPARISON:  None Available. FINDINGS: Frontal view of the pelvis as well as frontal and frogleg lateral views of the left hip are obtained. There is a comminuted intertrochanteric left hip fracture, with impaction and varus angulation at the fracture site. No dislocation. Symmetrical bilateral hip osteoarthritis. The remainder of the bony pelvis is unremarkable. IMPRESSION: Upper 1 comminuted intertrochanteric left hip fracture, with impaction and varus angulation. Electronically Signed   By: Sharlet Salina M.D.   On: 09/25/2023 16:51   DG Chest Portable 1 View  Result Date: 09/25/2023 CLINICAL DATA:  Larey Seat, left hip and arm pain EXAM: PORTABLE CHEST 1 VIEW COMPARISON:  02/05/2021 FINDINGS: Single frontal view of the chest demonstrates a stable cardiac silhouette. Continued ectasia and atherosclerosis of the thoracic aorta. No acute airspace disease, effusion, or pneumothorax. No acute bony abnormality. IMPRESSION: 1. No acute intrathoracic process. Electronically Signed   By: Sharlet Salina M.D.   On: 09/25/2023 16:51   DG Elbow Complete Left  Result Date: 09/25/2023 CLINICAL DATA:  Larey Seat downstairs, left arm pain EXAM: LEFT ELBOW - COMPLETE 3+ VIEW COMPARISON:  08/06/2022 FINDINGS: Frontal, bilateral oblique, and lateral views of the left elbow are obtained. Evaluation is limited due to suboptimal positioning.  There is an acute intra-articular fracture involving the lateral humeral epicondyle, best seen on the oblique projections. Chronic healed radial head fracture is unchanged since prior exam. No other acute bony abnormalities. Alignment appears  anatomic. Elevation of the anterior and posterior fat pads consistent with joint effusion. Soft tissue swelling of the elbow and proximal forearm. IMPRESSION: 1. Acute minimally displaced intra-articular lateral humeral epicondyle fracture, with associated joint effusion. 2. Chronic healed radial head fracture unchanged. 3. Soft tissue swelling. Electronically Signed   By: Sharlet Salina M.D.   On: 09/25/2023 16:50       LOS: 1 day   [employer]  [facility] Pager on [email or web address]  09/26/2023, 8:47 AM

## 2023-09-26 NOTE — Anesthesia Procedure Notes (Signed)
Procedure Name: Intubation Date/Time: 09/26/2023 11:32 AM  Performed by: Pincus Large, CRNAPre-anesthesia Checklist: Patient identified, Emergency Drugs available, Suction available and Patient being monitored Patient Re-evaluated:Patient Re-evaluated prior to induction Oxygen Delivery Method: Circle System Utilized Preoxygenation: Pre-oxygenation with 100% oxygen Induction Type: IV induction Ventilation: Mask ventilation without difficulty Laryngoscope Size: Mac and 3 Grade View: Grade I Tube type: Oral Tube size: 7.0 mm Number of attempts: 1 Airway Equipment and Method: Stylet and Oral airway Placement Confirmation: ETT inserted through vocal cords under direct vision, positive ETCO2 and breath sounds checked- equal and bilateral Secured at: 22 cm Tube secured with: Tape Dental Injury: Teeth and Oropharynx as per pre-operative assessment

## 2023-09-26 NOTE — TOC CAGE-AID Note (Signed)
Transition of Care [facility]) - CAGE-AID Screening   Patient Details  Name: Holly [name] MRN: [id number] Date of Birth: 1928/10/13  Transition of Care [facility]) CM/SW Contact:    Katha Hamming, RN Phone Number: 09/26/2023, 7:39 PM   Clinical Narrative:  Chart review, no hx of alcohol or drug use noted. No resources indicated.  CAGE-AID Screening:    Have You Ever Felt You Ought to Cut Down on Your Drinking or Drug Use?: No Have People Annoyed You By Critizing Your Drinking Or Drug Use?: No Have You Felt Bad Or Guilty About Your Drinking Or Drug Use?: No Have You Ever Had a Drink or Used Drugs First Thing In The Morning to Steady Your Nerves or to Get Rid of a Hangover?: No CAGE-AID Score: 0  Substance Abuse Education Offered: No

## 2023-09-26 NOTE — Transfer of Care (Signed)
Immediate Anesthesia Transfer of Care Note  Patient: Holly [name]  Procedure(s) Performed: INTRAMEDULLARY (IM) NAIL INTERTROCHANTERIC (Left)  Patient Location: PACU  Anesthesia Type:General  Level of Consciousness: awake, alert , and oriented  Airway & Oxygen Therapy: Patient Spontanous Breathing and Patient connected to face mask oxygen  Post-op Assessment: Report given to RN and Post -op Vital signs reviewed and stable  Post vital signs: Reviewed and stable  Last Vitals:  Vitals Value Taken Time  BP 117/72   Temp 97.5   Pulse 85   Resp 12 09/26/23 1215  SpO2 96 %   Vitals shown include unfiled device data.  Last Pain:  Vitals:   09/26/23 0923  TempSrc: Oral  PainSc:          Complications: No notable events documented.

## 2023-09-26 NOTE — Plan of Care (Signed)
  Problem: Activity: Goal: Risk for activity intolerance will decrease Outcome: Not Progressing   

## 2023-09-26 NOTE — H&P (View-Only) (Signed)
Orthopaedic Trauma Service (OTS) Consult   Patient ID: Holly [name] MRN: [id number] DOB/AGE: Apr 04, 1928 89 y.o.  Reason for Consult:Left hip fracture and left distal humerus fracture Referring Physician: Dr. Jackquline Bosch, MD Lala Lund  HPI: Holly [name] is an 89 y.o. female who is being seen in consultation at the request of Dr. Santa Genera for left hip fracture and left distal humerus fracture.  Patient had a ground-level fall and sustained of the above injuries.  She was admitted for surgery.  Her hemoglobin dropped significantly and was transfused 2 units.  She otherwise is doing well only complains of left hip pain and left elbow pain.  Her granddaughter is at bedside currently.  Patient ambulates at baseline.  She denies any blood thinners.  Past Medical History:  Diagnosis Date   CAD (coronary artery disease)    CELLULITIS 06/18/2008   CHF (congestive heart failure) (HCC)    CHRONIC OBSTRUCTIVE PULMONARY DISEASE, ACUTE EXACERBATION 11/16/2007   COPD 05/18/2007   COVID-19    GERD 12/25/2007   HYPERLIPIDEMIA 05/18/2007   HYPOTHYROIDISM 05/18/2007   Macular degeneration    Stroke (HCC) 08/2018   Urosepsis     Past Surgical History:  Procedure Laterality Date   CATARACT EXTRACTION     DILATION AND CURETTAGE OF UTERUS     KYPHOPLASTY N/A 05/02/2020   Procedure: Thoracic eleven KYPHOPLASTY;  Surgeon: Coletta Memos, MD;  Location: [facility] OR;  Service: Neurosurgery;  Laterality: N/A;  3C   TEE WITHOUT CARDIOVERSION N/A 09/01/2018   Procedure: TRANSESOPHAGEAL ECHOCARDIOGRAM (TEE);  Surgeon: Jodelle Red, MD;  Location: [facility] ENDOSCOPY;  Service: Cardiovascular;  Laterality: N/A;   VARICOSE VEIN SURGERY      Family History  Problem Relation Age of Onset   Ovarian cancer Mother    Diabetes Mellitus II Brother     Social History:  reports that she quit smoking about 37 years ago. Her smoking use included cigarettes. She started smoking about 77 years ago. She has never used  smokeless tobacco. She reports that she does not drink alcohol and does not use drugs.  Allergies:  Allergies  Allergen Reactions   Levofloxacin     Unknown reaction   Macrobid [Nitrofurantoin Macrocrystal] Nausea Only   Sulfamethoxazole     Unknown reaction   Tramadol Other (See Comments)    Shakes    Amoxicillin Rash    Has patient had a PCN reaction causing immediate rash, facial/tongue/throat swelling, SOB or lightheadedness with hypotension: No Has patient had a PCN reaction causing severe rash involving mucus membranes or skin necrosis: No Has patient had a PCN reaction that required hospitalization: No Has patient had a PCN reaction occurring within the last 10 years: No If all of the above answers are "NO", then may proceed with Cephalosporin use.   Made "bottom area" very raw    Medications: I have reviewed the patient's current medications.  ROS: Constitutional: No fever or chills Vision: No changes in vision ENT: No difficulty swallowing CV: No chest pain Pulm: No SOB or wheezing GI: No nausea or vomiting GU: No urgency or inability to hold urine Skin: No poor wound healing Neurologic: No numbness or tingling Psychiatric: No depression or anxiety Heme: No bruising Allergic: No reaction to medications or food   Exam: Blood pressure (!) 156/44, pulse 70, temperature 98.6 F (37 C), resp. rate 18, height 5\' 2"  (1.575 m), weight 44.1 kg, SpO2 92%. General: No acute distress Orientation: Awake alert and oriented x 3 Mood and Affect:  Cooperative and pleasant Gait: Unable to assess due to her fractures Coordination and balance: Within normal limits   Left lower extremity: Leg is shortened and externally rotated.  There is moderate edema in the lower extremity.  She is warm well-perfused foot.  Compartments are soft and compressible.  She has active dorsiflexion plantarflexion of foot and ankle.  She has sensation grossly intact to light touch.  Left upper  extremity: Long-arm splint is in place is clean dry and intact.  Compartments are soft compressible.  She has intact motor and sensory function to median, radial and ulnar nerve distribution.  She is warm well-perfused hand with brisk cap refill.  Right upper extremity right lower extremity: Skin without lesions. No tenderness to palpation. Full painless ROM, full strength in each muscle groups without evidence of instability.   Medical Decision Making: Data: Imaging: X-rays of the left hip show a displaced varus angulated intertrochanteric femur fracture.  X-rays and CT scan of the distal humerus and elbow are reviewed which shows a lateral condyle fracture with intra-articular extension.  Significant comminution along the lateral cortex.  Overall alignment of the capitellum and the trochlea maintained on.  Labs:  Results for orders placed or performed during the hospital encounter of 09/25/23 (from the past 24 hour(s))  Basic metabolic panel     Status: Abnormal   Collection Time: 09/25/23  4:30 PM  Result Value Ref Range   Sodium 139 135 - 145 mmol/L   Potassium 5.6 (H) 3.5 - 5.1 mmol/L   Chloride 104 98 - 111 mmol/L   CO2 24 22 - 32 mmol/L   Glucose, Bld 125 (H) 70 - 99 mg/dL   BUN 25 (H) 8 - 23 mg/dL   Creatinine, Ser 8.65 (H) 0.44 - 1.00 mg/dL   Calcium 8.7 (L) 8.9 - 10.3 mg/dL   GFR, Estimated 51 (L) >60 mL/min   Anion gap 11 5 - 15  CBC     Status: Abnormal   Collection Time: 09/25/23  5:21 PM  Result Value Ref Range   WBC 14.9 (H) 4.0 - 10.5 K/uL   RBC 3.27 (L) 3.87 - 5.11 MIL/uL   Hemoglobin 10.3 (L) 12.0 - 15.0 g/dL   HCT 78.4 (L) 69.6 - 29.5 %   MCV 96.6 80.0 - 100.0 fL   MCH 31.5 26.0 - 34.0 pg   MCHC 32.6 30.0 - 36.0 g/dL   RDW 28.4 (H) 13.2 - 44.0 %   Platelets 169 150 - 400 K/uL   nRBC 0.0 0.0 - 0.2 %  ABO/Rh     Status: None   Collection Time: 09/25/23  5:21 PM  Result Value Ref Range   ABO/RH(D)      O POS Performed at [facility], [address] [place], [place] [address]   Hemoglobin and hematocrit, blood     Status: Abnormal   Collection Time: 09/25/23  8:38 PM  Result Value Ref Range   Hemoglobin 7.9 (L) 12.0 - 15.0 g/dL   HCT 53.6 (L) 64.4 - 03.4 %  CK     Status: None   Collection Time: 09/25/23  8:38 PM  Result Value Ref Range   Total CK 88 38 - 234 U/L  Hepatic function panel     Status: Abnormal   Collection Time: 09/25/23  8:38 PM  Result Value Ref Range   Total Protein 6.2 (L) 6.5 - 8.1 g/dL   Albumin 3.4 (L) 3.5 - 5.0 g/dL   AST 29  15 - 41 U/L   ALT 15 0 - 44 U/L   Alkaline Phosphatase 59 38 - 126 U/L   Total Bilirubin 1.1 0.3 - 1.2 mg/dL   Bilirubin, Direct 0.3 (H) 0.0 - 0.2 mg/dL   Indirect Bilirubin 0.8 0.3 - 0.9 mg/dL  Type and screen [facility]     Status: None (Preliminary result)   Collection Time: 09/25/23 11:40 PM  Result Value Ref Range   ABO/RH(D) O POS    Antibody Screen NEG    Sample Expiration 09/28/2023,2359    Unit Number [id number]    Blood Component Type RBC LR PHER2    Unit division 00    Status of Unit ISSUED    Transfusion Status OK TO TRANSFUSE    Crossmatch Result Compatible    Unit Number [id number]    Blood Component Type RED CELLS,LR    Unit division 00    Status of Unit ISSUED    Transfusion Status OK TO TRANSFUSE    Crossmatch Result      Compatible Performed at [facility], [address] [place], [place] [address]    Unit Number [id number]    Blood Component Type RED CELLS,LR    Unit division 00    Status of Unit ALLOCATED    Transfusion Status OK TO TRANSFUSE    Crossmatch Result Compatible   Hemoglobin and hematocrit, blood     Status: Abnormal   Collection Time: 09/25/23 11:47 PM  Result Value Ref Range   Hemoglobin 7.1 (L) 12.0 - 15.0 g/dL   HCT 32.4 (L) 40.1 - 02.7 %  Prepare RBC (crossmatch)     Status: None   Collection Time: 09/25/23 11:47 PM  Result Value Ref Range   Order Confirmation      ORDER PROCESSED BY BLOOD  BANK Performed at [facility], [address] [place], [place] [address]   CBG monitoring, ED     Status: Abnormal   Collection Time: 09/25/23 11:52 PM  Result Value Ref Range   Glucose-Capillary 180 (H) 70 - 99 mg/dL  Surgical pcr screen     Status: Abnormal   Collection Time: 09/26/23  2:35 AM   Specimen: Nasal Mucosa; Nasal Swab  Result Value Ref Range   MRSA, PCR NEGATIVE NEGATIVE   Staphylococcus aureus POSITIVE (A) NEGATIVE  Glucose, capillary     Status: Abnormal   Collection Time: 09/26/23  6:05 AM  Result Value Ref Range   Glucose-Capillary 121 (H) 70 - 99 mg/dL   Comment 1 Notify RN    Comment 2 Document in Chart   Glucose, capillary     Status: None   Collection Time: 09/26/23 10:12 AM  Result Value Ref Range   Glucose-Capillary 89 70 - 99 mg/dL     Imaging or Labs ordered: None  Medical history and chart was reviewed and case discussed with medical provider.  Assessment/Plan: 89 year old female with left intertrochanteric femur fracture and left lateral condyle distal humerus fracture with intra-articular extension.  Due to the unstable nature of her left hip I recommend proceeding with cephalomedullary nailing of her left hip.  Risks and benefits were discussed with the patient and her family.  Risks include but not limited to bleeding, infection, malunion, nonunion, hardware failure, hardware irritation, nerve and blood vessel injury, DVT, they agreed to proceed with surgery and consent was obtained.  In regards to the distal humerus architecture is well aligned.  She does have an intra-articular split.  Due to her bone quality and the distal nature of her fracture and the comminution makes me feel that reliable fixation is likely.  Also with intra-articular nature she would likely need an osteotomy which at this age with the high morbidity.  Recommend nonoperative management with long-arm splinting for 1 to 2 weeks with potential transition to gentle range of  motion versus casting versus bracing.  There is always the option of potential total elbow arthroplasty if she goes on to develop significant posttraumatic arthritis.  Roby Lofts, MD [facility] [phone] (office) [email or web address]

## 2023-09-26 NOTE — Progress Notes (Signed)
Orthopedic Tech Progress Note Patient Details:  Holly [name] 08/01/1928 [id number]  Patient ID: Holly [name], female   DOB: September 24, 1928, 89 y.o.   MRN: [id number] Pt doesn't meet criteria for ohf. Pt must be under 70 to get ohf. Trinna Post 09/26/2023, 7:37 PM

## 2023-09-27 ENCOUNTER — Encounter (HOSPITAL_COMMUNITY): Payer: Self-pay | Admitting: Student

## 2023-09-27 DIAGNOSIS — D62 Acute posthemorrhagic anemia: Secondary | ICD-10-CM | POA: Diagnosis not present

## 2023-09-27 DIAGNOSIS — D696 Thrombocytopenia, unspecified: Secondary | ICD-10-CM | POA: Diagnosis not present

## 2023-09-27 DIAGNOSIS — S72002A Fracture of unspecified part of neck of left femur, initial encounter for closed fracture: Secondary | ICD-10-CM | POA: Diagnosis not present

## 2023-09-27 DIAGNOSIS — I959 Hypotension, unspecified: Secondary | ICD-10-CM | POA: Diagnosis not present

## 2023-09-27 LAB — CBC
HCT: 23.1 % — ABNORMAL LOW (ref 36.0–46.0)
HCT: 21.8 % — ABNORMAL LOW (ref 36.0–46.0)
Hemoglobin: 7.6 g/dL — ABNORMAL LOW (ref 12.0–15.0)
Hemoglobin: 7.2 g/dL — ABNORMAL LOW (ref 12.0–15.0)
MCH: 31 pg (ref 26.0–34.0)
MCH: 30.8 pg (ref 26.0–34.0)
MCHC: 32.9 g/dL (ref 30.0–36.0)
MCHC: 33 g/dL (ref 30.0–36.0)
MCV: 94.3 fL (ref 80.0–100.0)
MCV: 93.2 fL (ref 80.0–100.0)
Platelets: 72 10*3/uL — ABNORMAL LOW (ref 150–400)
Platelets: 83 10*3/uL — ABNORMAL LOW (ref 150–400)
RBC: 2.45 MIL/uL — ABNORMAL LOW (ref 3.87–5.11)
RBC: 2.34 MIL/uL — ABNORMAL LOW (ref 3.87–5.11)
RDW: 18.7 % — ABNORMAL HIGH (ref 11.5–15.5)
RDW: 18.6 % — ABNORMAL HIGH (ref 11.5–15.5)
WBC: 8.9 10*3/uL (ref 4.0–10.5)
WBC: 9.2 10*3/uL (ref 4.0–10.5)
nRBC: 0 % (ref 0.0–0.2)
nRBC: 0 % (ref 0.0–0.2)

## 2023-09-27 LAB — HEMOGLOBIN AND HEMATOCRIT, BLOOD
HCT: 21.5 % — ABNORMAL LOW (ref 36.0–46.0)
HCT: 24.9 % — ABNORMAL LOW (ref 36.0–46.0)
Hemoglobin: 7 g/dL — ABNORMAL LOW (ref 12.0–15.0)
Hemoglobin: 8.2 g/dL — ABNORMAL LOW (ref 12.0–15.0)

## 2023-09-27 LAB — DIFFERENTIAL
Abs Immature Granulocytes: 0.03 10*3/uL (ref 0.00–0.07)
Basophils Absolute: 0 10*3/uL (ref 0.0–0.1)
Basophils Relative: 0 %
Eosinophils Absolute: 0 10*3/uL (ref 0.0–0.5)
Eosinophils Relative: 0 %
Immature Granulocytes: 0 %
Lymphocytes Relative: 13 %
Lymphs Abs: 1.2 10*3/uL (ref 0.7–4.0)
Monocytes Absolute: 1.3 10*3/uL — ABNORMAL HIGH (ref 0.1–1.0)
Monocytes Relative: 15 %
Neutro Abs: 6.6 10*3/uL (ref 1.7–7.7)
Neutrophils Relative %: 72 %

## 2023-09-27 LAB — TECHNOLOGIST SMEAR REVIEW

## 2023-09-27 LAB — BASIC METABOLIC PANEL
Anion gap: 6 (ref 5–15)
BUN: 25 mg/dL — ABNORMAL HIGH (ref 8–23)
CO2: 22 mmol/L (ref 22–32)
Calcium: 7.9 mg/dL — ABNORMAL LOW (ref 8.9–10.3)
Chloride: 107 mmol/L (ref 98–111)
Creatinine, Ser: 1.36 mg/dL — ABNORMAL HIGH (ref 0.44–1.00)
GFR, Estimated: 36 mL/min — ABNORMAL LOW (ref >60)
Glucose, Bld: 129 mg/dL — ABNORMAL HIGH (ref 70–99)
Potassium: 4.8 mmol/L (ref 3.5–5.1)
Sodium: 135 mmol/L (ref 135–145)

## 2023-09-27 LAB — GLUCOSE, CAPILLARY
Glucose-Capillary: 124 mg/dL — ABNORMAL HIGH (ref 70–99)
Glucose-Capillary: 127 mg/dL — ABNORMAL HIGH (ref 70–99)
Glucose-Capillary: 144 mg/dL — ABNORMAL HIGH (ref 70–99)

## 2023-09-27 LAB — PREPARE RBC (CROSSMATCH)

## 2023-09-27 MED ORDER — SODIUM CHLORIDE 0.9% IV SOLUTION: Freq: Once | INTRAVENOUS | Status: AC

## 2023-09-27 MED ORDER — VITAMIN D 25 MCG (1000 UNIT) PO TABS
2000.0000 [IU] | ORAL_TABLET | Freq: Every day | ORAL | Status: DC
  Administered 2023-09-27 – 2023-10-01 (×5): 2000 [IU] via ORAL
  Filled 2023-09-27 (×5): qty 2

## 2023-09-27 MED ORDER — DEXTROSE-SODIUM CHLORIDE 5-0.9 % IV SOLN: INTRAVENOUS | Status: DC

## 2023-09-27 NOTE — Progress Notes (Addendum)
[facility] PROGRESS NOTE   Holly [name] JXB:[id number] DOB: [birth year] DOA: 09/25/2023  PCP: Ardith Dark, MD  Brief History: 89 y.o. female with medical history significant for COPD, seborrheic keratosis, hypothyroidism, generalized anxiety disorder, and hyperlipidemia presented to emergency department for evaluation after sustaining a fall at home.  Found to have left hip fracture as well as injury to the distal humerus.  Hospitalized for further management.    Consultants: Orthopedics  Procedures: None yet    Subjective/Interval History: Patient is very hard of hearing.  Was asleep but wakes up easily.  Denies any complaints.  Daughter is at the bedside.    Assessment/Plan:  Left hip fracture Seen by orthopedics.  Underwent surgery on 10/28.  PT OT evaluation.  Pain control.    Left humeral epicondylar fracture Management per orthopedics.  Conservative management being pursued at this time.  Hypotension Blood pressures were low initially.  Appears to have stabilized after she was started on midodrine.  Not on any antihypertensives prior to admission.    Acute blood loss anemia Hemoglobin was 10.3 when she was initially evaluated.  Dropped down to 7.1 overnight.  2 units of PRBC were transfused.  Drop in hemoglobin likely due to hip fracture.  Hemoglobin again drifting down.  No other overt bleeding noted.  Will give additional unit of PRBC today.  Recheck labs tomorrow. LFTs were normal yesterday.  Do not suspect pneumolysis.  Thrombocytopenia Platelet count was 169,000 on 10/27.  Decrease in counts noted.  She is not on any anticoagulants at this time.  Continue to monitor on a daily basis for now. Request peripheral smear examination.  Check LDH.    Chronic kidney disease stage IIIa Slight increase in creatinine noted today.  Stable compared to previous values.  Monitor urine output which has been somewhat low.  Could still be volume depleted.  Continue with  IV fluids.  Avoid nephrotoxic agents.   Echocardiogram from 2019 showed normal systolic function.  History of stroke Noted to be on aspirin.  History of COPD Continue inhalers.  Supplemental oxygen as needed.  Generalized anxiety disorder Stable.  Leukocytosis Likely reactive.  Normal this morning.  Hyperkalemia She was given calcium gluconate and Lokelma with improvement in potassium levels.  Hypothyroidism Continue levothyroxine.   DVT Prophylaxis: Noted to be on aspirin.  Hold off on anticoagulants considering drop in hemoglobin. Code Status: Full code Family Communication: Discussed with her daughter who was at the bedside Disposition Plan: To be determined  Status is: Inpatient Remains inpatient appropriate because: Left hip fracture, blood loss anemia      Medications: Scheduled:  sodium chloride   Intravenous Once   sodium chloride   Intravenous Once   sodium chloride   Intravenous Once   aspirin EC  81 mg Oral Daily   atorvastatin  40 mg Oral Daily   Chlorhexidine Gluconate Cloth  6 each Topical Q0600   docusate sodium  100 mg Oral BID   ferrous sulfate  325 mg Oral Q breakfast   levothyroxine  112 mcg Oral Q0600   midodrine  10 mg Oral TID WC   mometasone-formoterol  2 puff Inhalation BID   mupirocin ointment  1 Application Nasal BID   sodium chloride flush  3 mL Intravenous Q12H   Continuous:   ceFAZolin (ANCEF) IV 2 g (09/26/23 2012)   dextrose 5 % and 0.9 % NaCl     ZHY:QMVHQIONGEXBM, bisacodyl, dextrose, ipratropium-albuterol, metoCLOPramide **OR** metoCLOPramide (REGLAN) injection, morphine injection, ondansetron **  OR** ondansetron (ZOFRAN) IV, oxyCODONE, polyethylene glycol, sodium chloride flush  Antibiotics: Anti-infectives (From admission, onward)    Start     Dose/Rate Route Frequency Ordered Stop   09/26/23 2000  ceFAZolin (ANCEF) IVPB 2g/100 mL premix        2 g 200 mL/hr over 30 Minutes Intravenous Every 12 hours 09/26/23 1309  09/27/23 1959   09/26/23 1500  ceFAZolin (ANCEF) IVPB 2g/100 mL premix        2 g 200 mL/hr over 30 Minutes Intravenous On call to O.R. 09/26/23 0044 09/26/23 1152       Objective:  Vital Signs  Vitals:   09/27/23 0300 09/27/23 0400 09/27/23 0409 09/27/23 0820  BP:   (!) 103/44   Pulse:   60   Resp: 14 15 14    Temp:   97.9 F (36.6 C)   TempSrc:   Oral   SpO2:   99% 93%  Weight:      Height:        Intake/Output Summary (Last 24 hours) at 09/27/2023 0932 Last data filed at 09/27/2023 0519 Gross per 24 hour  Intake 1870.95 ml  Output 730 ml  Net 1140.95 ml   Filed Weights   09/25/23 1542 09/26/23 0020 09/26/23 1008  Weight: 36.3 kg 44.1 kg 44.1 kg    General appearance: Awake alert.  In no distress Resp: Clear to auscultation bilaterally.  Normal effort Cardio: S1-S2 is normal regular.  No S3-S4.  No rubs murmurs or bruit GI: Abdomen is soft.  Nontender nondistended.  Bowel sounds are present normal.  No masses organomegaly Extremities: Swelling of the left thigh noted.  No obvious bruising.  Able to move her feet.  Left arm is in a wrap. No obvious focal neurological deficits.   Lab Results:  Data Reviewed: I have personally reviewed following labs and reports of the imaging studies  CBC: Recent Labs  Lab 09/25/23 1721 09/25/23 2038 09/26/23 1027 09/26/23 1405 09/26/23 2105 09/27/23 0416 09/27/23 0821  WBC 14.9*  --   --  11.6*  --  8.9  --   HGB 10.3*   < > 10.9* 9.5* 8.0* 7.6* 7.0*  HCT 31.6*   < > 32.0* 29.3* 24.8* 23.1* 21.5*  MCV 96.6  --   --  93.3  --  94.3  --   PLT 169  --   --  83*  --  72*  --    < > = values in this interval not displayed.    Basic Metabolic Panel: Recent Labs  Lab 09/25/23 1630 09/26/23 1027 09/26/23 1405 09/27/23 0416  NA 139 139 134* 135  K 5.6* 4.4 4.2 4.8  CL 104 104 109 107  CO2 24  --  20* 22  GLUCOSE 125* 105* 120* 129*  BUN 25* 23 21 25*  CREATININE 1.03* 1.00 1.08* 1.36*  CALCIUM 8.7*  --  7.5*  7.9*    GFR: Estimated Creatinine Clearance: 18 mL/min (A) (by C-G formula based on SCr of 1.36 mg/dL (H)).  Liver Function Tests: Recent Labs  Lab 09/25/23 2038 09/26/23 1405  AST 29 24  ALT 15 18  ALKPHOS 59 44  BILITOT 1.1 0.9  PROT 6.2* 4.8*  ALBUMIN 3.4* 2.6*    Cardiac Enzymes: Recent Labs  Lab 09/25/23 2038  CKTOTAL 88    CBG: Recent Labs  Lab 09/26/23 0605 09/26/23 1012 09/26/23 1638 09/27/23 0013 09/27/23 0600  GLUCAP 121* 89 120* 144* 124*    Recent Results (from  the past 240 hour(s))  Surgical pcr screen     Status: Abnormal   Collection Time: 09/26/23  2:35 AM   Specimen: Nasal Mucosa; Nasal Swab  Result Value Ref Range Status   MRSA, PCR NEGATIVE NEGATIVE Final   Staphylococcus aureus POSITIVE (A) NEGATIVE Final    Comment: (NOTE) The Xpert SA Assay (FDA approved for NASAL specimens in patients 67 years of age and older), is one component of a comprehensive surveillance program. It is not intended to diagnose infection nor to guide or monitor treatment. Performed at [facility], [address] [place], [place] [address]       Radiology Studies: DG HIP PORT UNILAT W OR W/O PELVIS 1V LEFT  Result Date: 09/26/2023 CLINICAL DATA:  Fracture, postop. EXAM: DG HIP (WITH OR WITHOUT PELVIS) 1V PORT LEFT COMPARISON:  Preoperative imaging. FINDINGS: Femoral intramedullary nail with trans trochanteric and distal locking screw fixation traversing proximal femur fracture. Improved fracture alignment from preoperative imaging. Recent postsurgical change includes air and edema in the soft tissues. IMPRESSION: ORIF of proximal femur fracture. Electronically Signed   By: Narda Rutherford M.D.   On: 09/26/2023 14:58   DG HIP UNILAT WITH PELVIS 2-3 VIEWS LEFT  Result Date: 09/26/2023 CLINICAL DATA:  Elective surgery. EXAM: DG HIP (WITH OR WITHOUT PELVIS) 2-3V LEFT COMPARISON:  Preoperative imaging FINDINGS: Six fluoroscopic spot views of the left hip  obtained in the operating room. Imaging obtained during femoral intramedullary nail with trans trochanteric and distal locking screw fixation of proximal femur fracture. Fluoroscopy time 46 seconds. Dose 5.36 mGy. IMPRESSION: Intraoperative fluoroscopy during proximal femur fracture fixation. Electronically Signed   By: Narda Rutherford M.D.   On: 09/26/2023 12:45   DG C-Arm 1-60 Min-No Report  Result Date: 09/26/2023 Fluoroscopy was utilized by the requesting physician.  No radiographic interpretation.   CT ABDOMEN PELVIS WO CONTRAST  Result Date: 09/25/2023 CLINICAL DATA:  Abdominal trauma, blunt.  Hypotensive EXAM: CT ABDOMEN AND PELVIS WITHOUT CONTRAST TECHNIQUE: Multidetector CT imaging of the abdomen and pelvis was performed following the standard protocol without IV contrast. RADIATION DOSE REDUCTION: This exam was performed according to the departmental dose-optimization program which includes automated exposure control, adjustment of the mA and/or kV according to patient size and/or use of iterative reconstruction technique. COMPARISON:  None Available. FINDINGS: Lower chest: Bronchial wall thickening. Aortic valve leaflet and four-vessel coronary calcification. Enlarged heart. Cardiac changes suggestive of anemia. Hepatobiliary: Not enlarged. No focal lesion. The gallbladder is otherwise unremarkable with no radio-opaque gallstones. No biliary ductal dilatation. Pancreas: Normal pancreatic contour. No main pancreatic duct dilatation. Spleen: Not enlarged. No focal lesion. Adrenals/Urinary Tract: No nodularity bilaterally. No hydroureteronephrosis. No nephroureterolithiasis. No contour deforming renal mass. The urinary bladder is unremarkable. Stomach/Bowel: Slightly limited evaluation due to motion artifact. Stomach is within normal limits. No evidence of bowel wall thickening or dilatation. Colonic diverticulosis. The appendix is not definitely identified with no inflammatory changes in the  right lower quadrant to suggest acute appendicitis. Vasculature/Lymphatic: No abdominal aorta or iliac aneurysm. No abdominal, pelvic, inguinal lymphadenopathy. Reproductive: Normal. Other: No simple free fluid ascites. No pneumoperitoneum. No mesenteric hematoma identified. No organized fluid collection. Musculoskeletal: Right inguinal region femoral vessel coiling. No significant soft tissue hematoma. Acute comminuted and displaced left femoral intertrochanteric fracture. Acute left superior pubic rami minimally displaced fracture. No spinal fracture. Diffusely decreased bone density. Partially visualized acute comminuted and displaced left humeral supracondylar fracture. Ports and Devices: None. IMPRESSION: 1. Acute comminuted and displaced left  femoral intertrochanteric fracture. 2. Acute left superior pubic rami minimally displaced fracture. 3. Partially visualized acute comminuted and displaced left humeral supracondylar fracture. Ports and Devices: None. Finding better evaluated on left elbow radiograph 09/25/2023. 4. No acute intra-abdominal or intrapelvic traumatic injury with mid evaluation on this noncontrast study with motion artifact. 5. No acute fracture or traumatic malalignment of the lumbar spine. Other imaging findings of potential clinical significance: 1. Colonic diverticulosis with no acute diverticulitis. 2. Aortic Atherosclerosis (ICD10-I70.0) including aortic valve leaflet and four-vessel coronary artery calcification. Electronically Signed   By: Tish Frederickson M.D.   On: 09/25/2023 20:39   CT ELBOW LEFT WO CONTRAST  Result Date: 09/25/2023 CLINICAL DATA:  Left elbow fracture. EXAM: CT OF THE UPPER LEFT EXTREMITY WITHOUT CONTRAST TECHNIQUE: Multidetector CT imaging of the upper left extremity was performed according to the standard protocol. RADIATION DOSE REDUCTION: This exam was performed according to the departmental dose-optimization program which includes automated exposure  control, adjustment of the mA and/or kV according to patient size and/or use of iterative reconstruction technique. COMPARISON:  Radiograph of the left elbow from earlier today. FINDINGS: Bones/Joint/Cartilage There is an acute, mildly comminuted, intra-articular fracture deformity involving the lateral humeral epicondyle. Slight impaction of the fracture fragments identified. Chronic, slightly impacted fracture deformity involving the lateral aspect of the radial head is again noted in appears similar when compared with the plain film radiographs from 08/06/2022. Ligaments Suboptimally assessed by CT. Muscles and Tendons No mass identified. Soft tissues Small joint effusion. Soft tissue edema and hematoma identified overlying the lateral epicondyle of the distal humerus. IMPRESSION: 1. Acute, mildly comminuted, intra-articular fracture deformity involving the lateral humeral epicondyle. 2. Chronic, slightly impacted fracture deformity involving the lateral aspect of the radial head is again noted in appears similar when compared with the plain film radiographs from 08/06/2022. 3. Small joint effusion. 4. Soft tissue edema and hematoma identified overlying the lateral epicondyle of the distal humerus. Electronically Signed   By: Signa Kell M.D.   On: 09/25/2023 18:43   CT Head Wo Contrast  Result Date: 09/25/2023 CLINICAL DATA:  Fall, head and neck trauma EXAM: CT HEAD WITHOUT CONTRAST CT CERVICAL SPINE WITHOUT CONTRAST TECHNIQUE: Multidetector CT imaging of the head and cervical spine was performed following the standard protocol without intravenous contrast. Multiplanar CT image reconstructions of the cervical spine were also generated. RADIATION DOSE REDUCTION: This exam was performed according to the departmental dose-optimization program which includes automated exposure control, adjustment of the mA and/or kV according to patient size and/or use of iterative reconstruction technique. COMPARISON:  MR  brain, 04/16/2022 FINDINGS: CT HEAD FINDINGS Brain: No evidence of acute infarction, hemorrhage, hydrocephalus, extra-axial collection or mass lesion/mass effect. Periventricular and deep white matter hypodensity. Vascular: No hyperdense vessel or unexpected calcification. Skull: Normal. Negative for fracture or focal lesion. Sinuses/Orbits: No acute finding. Other: None. CT CERVICAL SPINE FINDINGS Alignment: Normal. Skull base and vertebrae: No acute fracture. No primary bone lesion or focal pathologic process. Soft tissues and spinal canal: No prevertebral fluid or swelling. No visible canal hematoma. Disc levels: Mild multilevel disc space height loss and osteophytosis throughout the cervical spine. Upper chest: Negative. Other: None. IMPRESSION: 1. No acute intracranial pathology. Small-vessel white matter disease. 2. No fracture or static subluxation of the cervical spine. 3. Mild multilevel cervical disc degenerative disease. Electronically Signed   By: Jearld Lesch M.D.   On: 09/25/2023 17:16   CT Cervical Spine Wo Contrast  Result Date: 09/25/2023 CLINICAL DATA:  Fall, head and neck trauma EXAM: CT HEAD WITHOUT CONTRAST CT CERVICAL SPINE WITHOUT CONTRAST TECHNIQUE: Multidetector CT imaging of the head and cervical spine was performed following the standard protocol without intravenous contrast. Multiplanar CT image reconstructions of the cervical spine were also generated. RADIATION DOSE REDUCTION: This exam was performed according to the departmental dose-optimization program which includes automated exposure control, adjustment of the mA and/or kV according to patient size and/or use of iterative reconstruction technique. COMPARISON:  MR brain, 04/16/2022 FINDINGS: CT HEAD FINDINGS Brain: No evidence of acute infarction, hemorrhage, hydrocephalus, extra-axial collection or mass lesion/mass effect. Periventricular and deep white matter hypodensity. Vascular: No hyperdense vessel or unexpected  calcification. Skull: Normal. Negative for fracture or focal lesion. Sinuses/Orbits: No acute finding. Other: None. CT CERVICAL SPINE FINDINGS Alignment: Normal. Skull base and vertebrae: No acute fracture. No primary bone lesion or focal pathologic process. Soft tissues and spinal canal: No prevertebral fluid or swelling. No visible canal hematoma. Disc levels: Mild multilevel disc space height loss and osteophytosis throughout the cervical spine. Upper chest: Negative. Other: None. IMPRESSION: 1. No acute intracranial pathology. Small-vessel white matter disease. 2. No fracture or static subluxation of the cervical spine. 3. Mild multilevel cervical disc degenerative disease. Electronically Signed   By: Jearld Lesch M.D.   On: 09/25/2023 17:16   DG HIP UNILAT WITH PELVIS 2-3 VIEWS LEFT  Result Date: 09/25/2023 CLINICAL DATA:  Larey Seat, left hip pain EXAM: DG HIP (WITH OR WITHOUT PELVIS) 2-3V LEFT COMPARISON:  None Available. FINDINGS: Frontal view of the pelvis as well as frontal and frogleg lateral views of the left hip are obtained. There is a comminuted intertrochanteric left hip fracture, with impaction and varus angulation at the fracture site. No dislocation. Symmetrical bilateral hip osteoarthritis. The remainder of the bony pelvis is unremarkable. IMPRESSION: Upper 1 comminuted intertrochanteric left hip fracture, with impaction and varus angulation. Electronically Signed   By: Sharlet Salina M.D.   On: 09/25/2023 16:51   DG Chest Portable 1 View  Result Date: 09/25/2023 CLINICAL DATA:  Larey Seat, left hip and arm pain EXAM: PORTABLE CHEST 1 VIEW COMPARISON:  02/05/2021 FINDINGS: Single frontal view of the chest demonstrates a stable cardiac silhouette. Continued ectasia and atherosclerosis of the thoracic aorta. No acute airspace disease, effusion, or pneumothorax. No acute bony abnormality. IMPRESSION: 1. No acute intrathoracic process. Electronically Signed   By: Sharlet Salina M.D.   On: 09/25/2023  16:51   DG Elbow Complete Left  Result Date: 09/25/2023 CLINICAL DATA:  Larey Seat downstairs, left arm pain EXAM: LEFT ELBOW - COMPLETE 3+ VIEW COMPARISON:  08/06/2022 FINDINGS: Frontal, bilateral oblique, and lateral views of the left elbow are obtained. Evaluation is limited due to suboptimal positioning. There is an acute intra-articular fracture involving the lateral humeral epicondyle, best seen on the oblique projections. Chronic healed radial head fracture is unchanged since prior exam. No other acute bony abnormalities. Alignment appears anatomic. Elevation of the anterior and posterior fat pads consistent with joint effusion. Soft tissue swelling of the elbow and proximal forearm. IMPRESSION: 1. Acute minimally displaced intra-articular lateral humeral epicondyle fracture, with associated joint effusion. 2. Chronic healed radial head fracture unchanged. 3. Soft tissue swelling. Electronically Signed   By: Sharlet Salina M.D.   On: 09/25/2023 16:50       LOS: 2 days   Holly [name]  [facility] Pager on [email or web address]  09/27/2023, 9:32 AM

## 2023-09-27 NOTE — Progress Notes (Signed)
PT Cancellation Note  Patient Details Name: Holly [name] MRN: [id number] DOB: 1928/03/07   Cancelled Treatment:    Reason Eval/Treat Not Completed: Medical issues which prohibited therapy; patient's daughter in the room and reported MD stated she would not do PT today.  Noted hemoglobin 7.0.  Will follow up for mobility another day per pt's daughter.    [name] 09/27/2023, 3:13 PM [name], PT Acute Rehabilitation Services Office:[phone] 09/27/2023

## 2023-09-27 NOTE — Progress Notes (Signed)
OT Cancellation Note  Patient Details Name: Holly [name] MRN: [id number] DOB: 01-Jun-1928   Cancelled Treatment:    Reason Eval/Treat Not Completed: Other (comment). Per pt daughter, MD asked not to get OOB today and pt with low hgb at 7.2. Despite follow up with RN about this and plans to receive blood today, daughter requesting wait until tomorrow. Will follow up as pt/family agreeable and schedule allows.   Tyler Deis, OTR/L [facility] Acute Rehabilitation Office: [phone]   Holly [name] 09/27/2023, 11:21 AM

## 2023-09-27 NOTE — Progress Notes (Signed)
Orthopaedic Trauma Progress Note  SUBJECTIVE: Doing well today.  Pain in the left arm and left leg controlled.  Has not been up out of bed yet since surgery.  Denies any numbness or tingling throughout the left upper or lower extremity.  No chest pain. No SOB. No nausea/vomiting. No other complaints.  No family at bedside currently.  OBJECTIVE:  Vitals:   09/27/23 0409 09/27/23 0820  BP: (!) 103/44   Pulse: 60   Resp: 14   Temp: 97.9 F (36.6 C)   SpO2: 99% 93%    General: Sitting up in bed, no acute distress.  Pleasant and cooperative Respiratory: No increased work of breathing.  Left upper extremity: Long-arm splint in place.  Nontender above splint.  Tolerates gentle shoulder motion.  Some swelling throughout the dorsum of the hand.  Able to wiggle fingers.  Dorsal sensation to light touch over the hand.  Fingers warm well-perfused Left lower extremity: Dressings clean, dry, intact.  No significant tenderness with palpation over the hip or throughout the thigh.  Ankle DF/PF intact. + EHL/FHL.  No tenderness with palpation throughout the calf. + DP pulse  IMAGING: Stable post op imaging.   LABS:  Results for orders placed or performed during the hospital encounter of 09/25/23 (from the past 24 hour(s))  Glucose, capillary     Status: None   Collection Time: 09/26/23 10:12 AM  Result Value Ref Range   Glucose-Capillary 89 70 - 99 mg/dL  I-STAT, chem 8     Status: Abnormal   Collection Time: 09/26/23 10:27 AM  Result Value Ref Range   Sodium 139 135 - 145 mmol/L   Potassium 4.4 3.5 - 5.1 mmol/L   Chloride 104 98 - 111 mmol/L   BUN 23 8 - 23 mg/dL   Creatinine, Ser 4.74 0.44 - 1.00 mg/dL   Glucose, Bld 259 (H) 70 - 99 mg/dL   Calcium, Ion 5.63 8.75 - 1.40 mmol/L   TCO2 22 22 - 32 mmol/L   Hemoglobin 10.9 (L) 12.0 - 15.0 g/dL   HCT 64.3 (L) 32.9 - 51.8 %  Comprehensive metabolic panel     Status: Abnormal   Collection Time: 09/26/23  2:05 PM  Result Value Ref Range   Sodium  134 (L) 135 - 145 mmol/L   Potassium 4.2 3.5 - 5.1 mmol/L   Chloride 109 98 - 111 mmol/L   CO2 20 (L) 22 - 32 mmol/L   Glucose, Bld 120 (H) 70 - 99 mg/dL   BUN 21 8 - 23 mg/dL   Creatinine, Ser 8.41 (H) 0.44 - 1.00 mg/dL   Calcium 7.5 (L) 8.9 - 10.3 mg/dL   Total Protein 4.8 (L) 6.5 - 8.1 g/dL   Albumin 2.6 (L) 3.5 - 5.0 g/dL   AST 24 15 - 41 U/L   ALT 18 0 - 44 U/L   Alkaline Phosphatase 44 38 - 126 U/L   Total Bilirubin 0.9 0.3 - 1.2 mg/dL   GFR, Estimated 48 (L) >60 mL/min   Anion gap 5 5 - 15  CBC     Status: Abnormal   Collection Time: 09/26/23  2:05 PM  Result Value Ref Range   WBC 11.6 (H) 4.0 - 10.5 K/uL   RBC 3.14 (L) 3.87 - 5.11 MIL/uL   Hemoglobin 9.5 (L) 12.0 - 15.0 g/dL   HCT 66.0 (L) 63.0 - 16.0 %   MCV 93.3 80.0 - 100.0 fL   MCH 30.3 26.0 - 34.0 pg   MCHC  32.4 30.0 - 36.0 g/dL   RDW 14.7 (H) 82.9 - 56.2 %   Platelets 83 (L) 150 - 400 K/uL   nRBC 0.0 0.0 - 0.2 %  Reticulocytes     Status: Abnormal   Collection Time: 09/26/23  2:05 PM  Result Value Ref Range   Retic Ct Pct 1.8 0.4 - 3.1 %   RBC. 3.18 (L) 3.87 - 5.11 MIL/uL   Retic Count, Absolute 57.9 19.0 - 186.0 K/uL   Immature Retic Fract 16.9 (H) 2.3 - 15.9 %  Iron and TIBC     Status: Abnormal   Collection Time: 09/26/23  2:05 PM  Result Value Ref Range   Iron 155 28 - 170 ug/dL   TIBC 130 (L) 865 - 784 ug/dL   Saturation Ratios 81 (H) 10.4 - 31.8 %   UIBC 37 ug/dL  Ferritin     Status: None   Collection Time: 09/26/23  2:05 PM  Result Value Ref Range   Ferritin 84 11 - 307 ng/mL  VITAMIN D 25 Hydroxy (Vit-D Deficiency, Fractures)     Status: Abnormal   Collection Time: 09/26/23  2:05 PM  Result Value Ref Range   Vit D, 25-Hydroxy 26.05 (L) 30 - 100 ng/mL  Vitamin B12     Status: None   Collection Time: 09/26/23  2:06 PM  Result Value Ref Range   Vitamin B-12 241 180 - 914 pg/mL  Folate     Status: None   Collection Time: 09/26/23  2:06 PM  Result Value Ref Range   Folate 10.9 >5.9 ng/mL   Glucose, capillary     Status: Abnormal   Collection Time: 09/26/23  4:38 PM  Result Value Ref Range   Glucose-Capillary 120 (H) 70 - 99 mg/dL  Hemoglobin and hematocrit, blood     Status: Abnormal   Collection Time: 09/26/23  9:05 PM  Result Value Ref Range   Hemoglobin 8.0 (L) 12.0 - 15.0 g/dL   HCT 69.6 (L) 29.5 - 28.4 %  Glucose, capillary     Status: Abnormal   Collection Time: 09/27/23 12:13 AM  Result Value Ref Range   Glucose-Capillary 144 (H) 70 - 99 mg/dL  CBC     Status: Abnormal   Collection Time: 09/27/23  4:16 AM  Result Value Ref Range   WBC 8.9 4.0 - 10.5 K/uL   RBC 2.45 (L) 3.87 - 5.11 MIL/uL   Hemoglobin 7.6 (L) 12.0 - 15.0 g/dL   HCT 13.2 (L) 44.0 - 10.2 %   MCV 94.3 80.0 - 100.0 fL   MCH 31.0 26.0 - 34.0 pg   MCHC 32.9 30.0 - 36.0 g/dL   RDW 72.5 (H) 36.6 - 44.0 %   Platelets 72 (L) 150 - 400 K/uL   nRBC 0.0 0.0 - 0.2 %  Basic metabolic panel     Status: Abnormal   Collection Time: 09/27/23  4:16 AM  Result Value Ref Range   Sodium 135 135 - 145 mmol/L   Potassium 4.8 3.5 - 5.1 mmol/L   Chloride 107 98 - 111 mmol/L   CO2 22 22 - 32 mmol/L   Glucose, Bld 129 (H) 70 - 99 mg/dL   BUN 25 (H) 8 - 23 mg/dL   Creatinine, Ser 3.47 (H) 0.44 - 1.00 mg/dL   Calcium 7.9 (L) 8.9 - 10.3 mg/dL   GFR, Estimated 36 (L) >60 mL/min   Anion gap 6 5 - 15  Glucose, capillary     Status: Abnormal  Collection Time: 09/27/23  6:00 AM  Result Value Ref Range   Glucose-Capillary 124 (H) 70 - 99 mg/dL  Hemoglobin and hematocrit, blood     Status: Abnormal   Collection Time: 09/27/23  8:21 AM  Result Value Ref Range   Hemoglobin 7.0 (L) 12.0 - 15.0 g/dL   HCT 09.8 (L) 11.9 - 14.7 %  Prepare RBC (crossmatch)     Status: None   Collection Time: 09/27/23  8:27 AM  Result Value Ref Range   Order Confirmation      ORDER PROCESSED BY BLOOD BANK BB SAMPLE OR UNITS ALREADY AVAILABLE Performed at [facility], [address], [place], [place] [address]     ASSESSMENT:  Holly [name] is a 89 y.o. female, 1 Day Post-Op s/p INTRAMEDULLARY NAIL LEFT INTERTROCHANTERIC FEMUR FRACTURE  NONOPERATIVE MANAGEMENT LEFT ELBOW FRACTURE  CV/Blood loss: Acute blood loss anemia, Hgb 7.6 this morning.  1 unit PRBCs ordered per primary team. Hemodynamically stable  PLAN: Weightbearing: WBAT LLE. NWB LUE ROM:  LUE - maintain splint LLE - unrestricted ROM  Incisional and dressing care: Reinforce dressings as needed  Showering:  Ok to begin getting LLE incisions wet 09/29/23. Keep splint dry when showering Orthopedic device(s): Splint LUE Pain management:  1. Tylenol 325-650 mg q 6 hours PRN 2. Robaxin 500 mg q 6 hours PRN 3. Oxycodone 5 mg q 4 hours PRN 4. Morphine 0.5-1 mg q 2 hours PRN VTE prophylaxis: Aspirin, SCDs ID:  Ancef 2gm post op Foley/Lines:  No foley, KVO IVFs Impediments to Fracture Healing: Vit D level 26. Start supplementation Dispo: PT/OT evaluation today, patient from home prior to admission.  Will likely need SNF at discharge.  Continue to monitor CBC.    D/C recommendations: -Oxycodone 5 mg for pain control -Aspirin 81 mg for DVT prophylaxis -Continue 2000 units Vit D supplementation daily x 90 days  Follow - up plan: 2 weeks after discharge for wound check and repeat x-rays   Contact information:  Truitt Merle MD, Thyra Breed PA-C. After hours and holidays please check [email or web address] for group call information for [facility]   Thompson Caul, PA-C [phone] (office) [email or web address]

## 2023-09-28 ENCOUNTER — Other Ambulatory Visit: Payer: Self-pay

## 2023-09-28 DIAGNOSIS — Y92009 Unspecified place in unspecified non-institutional (private) residence as the place of occurrence of the external cause: Secondary | ICD-10-CM

## 2023-09-28 DIAGNOSIS — I10 Essential (primary) hypertension: Secondary | ICD-10-CM | POA: Diagnosis not present

## 2023-09-28 DIAGNOSIS — S72002A Fracture of unspecified part of neck of left femur, initial encounter for closed fracture: Secondary | ICD-10-CM | POA: Diagnosis not present

## 2023-09-28 DIAGNOSIS — S42432A Displaced fracture (avulsion) of lateral epicondyle of left humerus, initial encounter for closed fracture: Secondary | ICD-10-CM | POA: Diagnosis not present

## 2023-09-28 DIAGNOSIS — W19XXXA Unspecified fall, initial encounter: Secondary | ICD-10-CM | POA: Diagnosis not present

## 2023-09-28 LAB — BPAM RBC
Blood Product Expiration Date: 202411202359
Blood Product Expiration Date: 202411202359
Blood Product Expiration Date: 202411202359
ISSUE DATE / TIME: 202410280253
ISSUE DATE / TIME: 202410280546
ISSUE DATE / TIME: 202410291415
Unit Type and Rh: 5100
Unit Type and Rh: 5100
Unit Type and Rh: 5100

## 2023-09-28 LAB — BASIC METABOLIC PANEL
Anion gap: 5 (ref 5–15)
BUN: 32 mg/dL — ABNORMAL HIGH (ref 8–23)
CO2: 23 mmol/L (ref 22–32)
Calcium: 7.7 mg/dL — ABNORMAL LOW (ref 8.9–10.3)
Chloride: 105 mmol/L (ref 98–111)
Creatinine, Ser: 1.45 mg/dL — ABNORMAL HIGH (ref 0.44–1.00)
GFR, Estimated: 34 mL/min — ABNORMAL LOW (ref >60)
Glucose, Bld: 125 mg/dL — ABNORMAL HIGH (ref 70–99)
Potassium: 4.8 mmol/L (ref 3.5–5.1)
Sodium: 133 mmol/L — ABNORMAL LOW (ref 135–145)

## 2023-09-28 LAB — CBC
HCT: 26.2 % — ABNORMAL LOW (ref 36.0–46.0)
Hemoglobin: 8.4 g/dL — ABNORMAL LOW (ref 12.0–15.0)
MCH: 29 pg (ref 26.0–34.0)
MCHC: 32.1 g/dL (ref 30.0–36.0)
MCV: 90.3 fL (ref 80.0–100.0)
Platelets: 88 10*3/uL — ABNORMAL LOW (ref 150–400)
RBC: 2.9 MIL/uL — ABNORMAL LOW (ref 3.87–5.11)
RDW: 19.8 % — ABNORMAL HIGH (ref 11.5–15.5)
WBC: 12.1 10*3/uL — ABNORMAL HIGH (ref 4.0–10.5)
nRBC: 0.2 % (ref 0.0–0.2)

## 2023-09-28 LAB — TYPE AND SCREEN
ABO/RH(D): O POS
Antibody Screen: NEGATIVE
Unit division: 0
Unit division: 0
Unit division: 0
Weak D: POSITIVE

## 2023-09-28 LAB — GLUCOSE, CAPILLARY
Glucose-Capillary: 103 mg/dL — ABNORMAL HIGH (ref 70–99)
Glucose-Capillary: 107 mg/dL — ABNORMAL HIGH (ref 70–99)
Glucose-Capillary: 101 mg/dL — ABNORMAL HIGH (ref 70–99)
Glucose-Capillary: 108 mg/dL — ABNORMAL HIGH (ref 70–99)

## 2023-09-28 LAB — HEPATIC FUNCTION PANEL
ALT: 6 U/L (ref 0–44)
AST: 19 U/L (ref 15–41)
Albumin: 2.4 g/dL — ABNORMAL LOW (ref 3.5–5.0)
Alkaline Phosphatase: 45 U/L (ref 38–126)
Bilirubin, Direct: 0.2 mg/dL (ref 0.0–0.2)
Indirect Bilirubin: 0.7 mg/dL (ref 0.3–0.9)
Total Bilirubin: 0.9 mg/dL (ref 0.3–1.2)
Total Protein: 4.7 g/dL — ABNORMAL LOW (ref 6.5–8.1)

## 2023-09-28 LAB — LACTATE DEHYDROGENASE: LDH: 150 U/L (ref 98–192)

## 2023-09-28 MED ORDER — MIDODRINE HCL 5 MG PO TABS: 5.0000 mg | ORAL_TABLET | Freq: Three times a day (TID) | ORAL | Status: DC

## 2023-09-28 MED ORDER — ACETAMINOPHEN 325 MG PO TABS
650.0000 mg | ORAL_TABLET | Freq: Four times a day (QID) | ORAL | Status: DC
  Administered 2023-09-29 – 2023-10-01 (×7): 650 mg via ORAL
  Filled 2023-09-28 (×8): qty 2

## 2023-09-28 MED ORDER — SODIUM CHLORIDE 0.9% FLUSH
10.0000 mL | Freq: Two times a day (BID) | INTRAVENOUS | Status: DC
  Administered 2023-09-29: 10 mL
  Administered 2023-09-29: 20 mL
  Administered 2023-09-30 (×3): 10 mL

## 2023-09-28 MED ORDER — VITAMIN D3 25 MCG PO TABS: 2000.0000 [IU] | ORAL_TABLET | Freq: Every day | ORAL | 2 refills | Status: DC

## 2023-09-28 MED ORDER — SODIUM CHLORIDE 0.9% FLUSH: 10.0000 mL | INTRAVENOUS | Status: DC | PRN

## 2023-09-28 MED ORDER — OXYCODONE HCL 5 MG PO TABS: 2.5000 mg | ORAL_TABLET | ORAL | 0 refills | Status: DC | PRN

## 2023-09-28 NOTE — Progress Notes (Signed)
Status DNR Following MD conversations with granddaughter Marylene Land this am, she stated to this nurse that the patient wanted everything done to save her life except chest compression.

## 2023-09-28 NOTE — Care Management Important Message (Signed)
Important Message  Patient Details  Name: Holly [name] MRN: [id number] Date of Birth: 05-30-1928   Important Message Given:  Yes - Medicare IM     Sherilyn Banker 09/28/2023, 12:38 PM

## 2023-09-28 NOTE — Evaluation (Signed)
Physical Therapy Evaluation Patient Details Name: Holly [name] MRN: [id number] DOB: [birth year] Today's Date: 09/28/2023  History of Present Illness  Pt presenting 10/27 after fall at home resulting in L hip, head, and arm pain. CT L elbow with Acute, mildly comminuted, intra-articular fracture deformity involving the lateral humeral epicondyle. L hip xray with comminuted intertrochanteric L hip fx. Now s/p cephalomedullary nailing of L intertrochanteric femur fx 10/28. PMH significant for COPD, seborrheic keratosis, hypothyroidism, anxiety, HLD.   Clinical Impression  Pt in bed upon arrival and agreeable to PT eval. Prior to admission, pt reported ambulating independently w/ no AD. Pt required MaxAx2 for bed mobility and to stand at this time. Pt was limited with mobility likely due to pain. Pt was able to stand with MaxAx2 and 1UE hold, however, upon shifting weight bilaterally, pt's knees buckled. Pt was able to tolerate two stands before becoming fatigued. Pt presents to therapy session below baseline with decreased strength, balance, mobility, and activity tolerance. Pt would benefit from acute skilled PT to address functional impairments. Recommending post-acute rehab <3 hrs to work towards previous level of independence. Acute PT to follow.          If plan is discharge home, recommend the following: A lot of help with walking and/or transfers;A lot of help with bathing/dressing/bathroom;Assist for transportation;Help with stairs or ramp for entrance   Can travel by private vehicle   No    Equipment Recommendations  (TBD at next level of care)     Functional Status Assessment Patient has had a recent decline in their functional status and demonstrates the ability to make significant improvements in function in a reasonable and predictable amount of time.     Precautions / Restrictions Precautions Precautions: Fall Restrictions Weight Bearing Restrictions: Yes LUE Weight Bearing:  Non weight bearing LLE Weight Bearing: Weight bearing as tolerated Other Position/Activity Restrictions: can WB through LUE with platform walker      Mobility  Bed Mobility Overal bed mobility: Needs Assistance Bed Mobility: Supine to Sit, Sit to Supine     Supine to sit: Max assist, +2 for physical assistance, HOB elevated Sit to supine: Max assist   General bed mobility comments: MaxAx2 for LE management and trunk navigation. Pt did not assist with moving legs towards EOB.    Transfers Overall transfer level: Needs assistance Equipment used: 2 person hand held assist Transfers: Sit to/from Stand Sit to Stand: Max assist, +2 physical assistance      General transfer comment: B knees buckled upon standing. Limited ability to weight shift and bear weight on L LE.    Ambulation/Gait    General Gait Details: nt, pt unable to weight shift at this time w/o B knees buckling        Balance Overall balance assessment: Needs assistance Sitting-balance support: No upper extremity supported, Feet supported Sitting balance-Leahy Scale: Fair Sitting balance - Comments: no assistance needing with sitting statically, limited ability to lean   Standing balance support: Bilateral upper extremity supported, During functional activity, Reliant on assistive device for balance Standing balance-Leahy Scale: Poor Standing balance comment: max A x2 for maintaining standing, not able to stand without UE assistance          Pertinent Vitals/Pain Pain Assessment Faces Pain Scale: Hurts even more Pain Location: L elbow, LLE with activity Pain Descriptors / Indicators: Aching, Discomfort, Grimacing    Home Living Family/patient expects to be discharged to:: Private residence Living Arrangements: Children Available Help at Discharge: Family;Available  24 hours/day Type of Home: House Home Access: Stairs to enter Entrance Stairs-Rails: Can reach both Entrance Stairs-Number of Steps:  5 Alternate Level Stairs-Number of Steps: flight Home Layout: Two level;Able to live on main level with bedroom/bathroom Home Equipment: Rolling Walker (2 wheels);Shower seat;Cane - single point Additional Comments: Pt lives with son and daughter, daughter is available 24/7    Prior Function Prior Level of Function : Independent/Modified Independent      Mobility Comments: ind, no AD ADLs Comments: ind, daughter cooks/cleans     Extremity/Trunk Assessment   Upper Extremity Assessment Upper Extremity Assessment: Defer to OT evaluation LUE Deficits / Details: L lateral condyle fx, conservative management, NWB, able to use platform walker as tolerated LUE: Shoulder pain with ROM LUE Sensation: WNL LUE Coordination: decreased gross motor;decreased fine motor    Lower Extremity Assessment Lower Extremity Assessment: Generalized weakness (limited L knee ext ROM due to pain)    Cervical / Trunk Assessment Cervical / Trunk Assessment: Kyphotic  Communication   Communication Communication: Hearing impairment  Cognition Arousal: Alert Behavior During Therapy: WFL for tasks assessed/performed Overall Cognitive Status: Within Functional Limits for tasks assessed       General Comments General comments (skin integrity, edema, etc.): VSS on RA     PT Assessment Patient needs continued PT services  PT Problem List Decreased strength;Decreased range of motion;Decreased activity tolerance;Decreased mobility;Decreased balance       PT Treatment Interventions DME instruction;Gait training;Functional mobility training;Therapeutic activities;Therapeutic exercise;Neuromuscular re-education;Balance training;Patient/family education;Wheelchair mobility training    PT Goals (Current goals can be found in the Care Plan section)  Acute Rehab PT Goals Patient Stated Goal: to get better PT Goal Formulation: With patient Time For Goal Achievement: 10/12/23 Potential to Achieve Goals: Good     Frequency Min 1X/week     Co-evaluation PT/OT/SLP Co-Evaluation/Treatment: Yes Reason for Co-Treatment: Complexity of the patient's impairments (multi-system involvement);For patient/therapist safety;To address functional/ADL transfers PT goals addressed during session: Mobility/safety with mobility;Balance OT goals addressed during session: ADL's and self-care;Proper use of Adaptive equipment and DME       AM-PAC PT "6 Clicks" Mobility  Outcome Measure Help needed turning from your back to your side while in a flat bed without using bedrails?: A Lot Help needed moving from lying on your back to sitting on the side of a flat bed without using bedrails?: Total Help needed moving to and from a bed to a chair (including a wheelchair)?: Total Help needed standing up from a chair using your arms (e.g., wheelchair or bedside chair)?: Total Help needed to walk in hospital room?: Total Help needed climbing 3-5 steps with a railing? : Total 6 Click Score: 7    End of Session Equipment Utilized During Treatment: Gait belt Activity Tolerance: Patient limited by pain Patient left: in bed;with call bell/phone within reach;with bed alarm set;with family/visitor present Nurse Communication: Mobility status PT Visit Diagnosis: Unsteadiness on feet (R26.81);Repeated falls (R29.6);Muscle weakness (generalized) (M62.81)    Time: 7829-5621 PT Time Calculation (min) (ACUTE ONLY): 30 min   Charges:   PT Evaluation $PT Eval Low Complexity: 1 Low   PT General Charges $$ ACUTE PT VISIT: 1 Visit        Hilton Cork, PT, DPT Secure Chat Preferred  Rehab Office [phone]   Arturo Morton Brion Aliment 09/28/2023, 2:50 PM

## 2023-09-28 NOTE — Progress Notes (Signed)
Dear Doctor:  This patient has been identified as a candidate for PICC for the following reason (s): poor veins/poor circulatory system (CHF, COPD, emphysema, diabetes, steroid use, IV drug abuse, etc.) If you agree, please write an order for the indicated device. For any questions contact the Vascular Access Team at [phone] if no answer, please leave a message.  Thank you for supporting the early vascular access assessment program. 

## 2023-09-28 NOTE — Progress Notes (Signed)
Orthopaedic Trauma Progress Note  SUBJECTIVE: Doing ok today.  Had a difficult night due to agitation and confusion. Pain in the left arm and left leg controlled.  PT/OT held yesterday due to low hgb.  Denies any numbness or tingling throughout the left upper or lower extremity.  No chest pain. No SOB. No nausea/vomiting. No other complaints.  Son and two grand-daughters at bedside this AM  OBJECTIVE:  Vitals:   09/28/23 0500 09/28/23 0754  BP:  (!) 145/61  Pulse:    Resp: 16 19  Temp:  97.9 F (36.6 C)  SpO2:      General: Resting comofrtably in bed, no acute distress.  Pleasant and cooperative Respiratory: No increased work of breathing.  Left upper extremity: Long-arm splint in place.  Nontender above splint.  Tolerates gentle shoulder motion.  Some swelling throughout the dorsum of the hand.  Able to wiggle fingers.  Endorses sensation to light touch over the hand.  Fingers warm well-perfused Left lower extremity: Dressings removed, incisions clean, dry, intact.  No significant tenderness with palpation over the hip or throughout the thigh.  Ankle DF/PF intact. + EHL/FHL.  Soreness through the calf but no areas of significant tenderness. + DP pulse  IMAGING: Stable post op imaging.   LABS:  Results for orders placed or performed during the hospital encounter of 09/25/23 (from the past 24 hour(s))  Hemoglobin and hematocrit, blood     Status: Abnormal   Collection Time: 09/27/23  8:39 PM  Result Value Ref Range   Hemoglobin 8.2 (L) 12.0 - 15.0 g/dL   HCT 78.2 (L) 95.6 - 21.3 %  Glucose, capillary     Status: Abnormal   Collection Time: 09/27/23 11:17 PM  Result Value Ref Range   Glucose-Capillary 127 (H) 70 - 99 mg/dL  CBC     Status: Abnormal   Collection Time: 09/28/23  5:50 AM  Result Value Ref Range   WBC 12.1 (H) 4.0 - 10.5 K/uL   RBC 2.90 (L) 3.87 - 5.11 MIL/uL   Hemoglobin 8.4 (L) 12.0 - 15.0 g/dL   HCT 08.6 (L) 57.8 - 46.9 %   MCV 90.3 80.0 - 100.0 fL   MCH 29.0  26.0 - 34.0 pg   MCHC 32.1 30.0 - 36.0 g/dL   RDW 62.9 (H) 52.8 - 41.3 %   Platelets 88 (L) 150 - 400 K/uL   nRBC 0.2 0.0 - 0.2 %  Basic metabolic panel     Status: Abnormal   Collection Time: 09/28/23  5:50 AM  Result Value Ref Range   Sodium 133 (L) 135 - 145 mmol/L   Potassium 4.8 3.5 - 5.1 mmol/L   Chloride 105 98 - 111 mmol/L   CO2 23 22 - 32 mmol/L   Glucose, Bld 125 (H) 70 - 99 mg/dL   BUN 32 (H) 8 - 23 mg/dL   Creatinine, Ser 2.44 (H) 0.44 - 1.00 mg/dL   Calcium 7.7 (L) 8.9 - 10.3 mg/dL   GFR, Estimated 34 (L) >60 mL/min   Anion gap 5 5 - 15  Lactate dehydrogenase     Status: None   Collection Time: 09/28/23  5:50 AM  Result Value Ref Range   LDH 150 98 - 192 U/L  Hepatic function panel     Status: Abnormal   Collection Time: 09/28/23  5:50 AM  Result Value Ref Range   Total Protein 4.7 (L) 6.5 - 8.1 g/dL   Albumin 2.4 (L) 3.5 - 5.0 g/dL  AST 19 15 - 41 U/L   ALT 6 0 - 44 U/L   Alkaline Phosphatase 45 38 - 126 U/L   Total Bilirubin 0.9 0.3 - 1.2 mg/dL   Bilirubin, Direct 0.2 0.0 - 0.2 mg/dL   Indirect Bilirubin 0.7 0.3 - 0.9 mg/dL  Glucose, capillary     Status: Abnormal   Collection Time: 09/28/23  6:26 AM  Result Value Ref Range   Glucose-Capillary 103 (H) 70 - 99 mg/dL    ASSESSMENT: Holly [name] is a 89 y.o. female, 2 Days Post-Op s/p INTRAMEDULLARY NAIL LEFT INTERTROCHANTERIC FEMUR FRACTURE  NONOPERATIVE MANAGEMENT LEFT ELBOW FRACTURE  CV/Blood loss: Acute blood loss anemia, Hgb 8.2 this morning. Received 1 unit PRBCs on 09/27/23. Hemodynamically stable  PLAN: Weightbearing: WBAT LLE. NWB LUE ROM:  LUE - maintain splint LLE - unrestricted ROM  Incisional and dressing care: Ok to leave incisions open to air. Showering:  Ok to begin getting LLE incisions wet 09/29/23. Keep LUE splint dry when showering Orthopedic device(s): Splint LUE Pain management:  1. Tylenol 325-650 mg q 6 hours PRN 2. Robaxin 500 mg q 6 hours PRN 3. Oxycodone 5 mg q 4 hours  PRN 4. Morphine 0.5-1 mg q 2 hours PRN VTE prophylaxis: Aspirin, SCDs ID:  Ancef 2gm post op completed Foley/Lines:  No foley, KVO IVFs Impediments to Fracture Healing: Vit D level 26. Start supplementation Dispo: PT/OT evaluation today, may require SNF at discharge. OK for d/c from ortho standpoint once cleared by medicine team and therapies.    D/C recommendations: -Oxycodone 5 mg for pain control -Aspirin 81 mg for DVT prophylaxis -Continue 2000 units Vit D supplementation daily x 90 days  Follow - up plan: 2 weeks after discharge for wound check and repeat x-rays   Contact information:  Truitt Merle MD, Thyra Breed PA-C. After hours and holidays please check [email or web address] for group call information for [facility]   Thompson Caul, PA-C [phone] (office) [email or web address]

## 2023-09-28 NOTE — Progress Notes (Signed)
Peripherally Inserted Central Catheter Placement  The IV Nurse has discussed with the patient and/or persons authorized to consent for the patient, the purpose of this procedure and the potential benefits and risks involved with this procedure.  The benefits include less needle sticks, lab draws from the catheter, and the patient may be discharged home with the catheter. Risks include, but not limited to, infection, bleeding, blood clot (thrombus formation), and puncture of an artery; nerve damage and irregular heartbeat and possibility to perform a PICC exchange if needed/ordered by physician.  Alternatives to this procedure were also discussed.  Bard Power PICC patient education guide, fact sheet on infection prevention and patient information card has been provided to patient /or left at bedside. Lawanna Kobus, granddaughter signed consent at bedside per patient request.   PICC Placement Documentation  PICC Double Lumen 09/28/23 Right Brachial 32 cm 0 cm (Active)  Indication for Insertion or Continuance of Line Limited venous access - need for IV therapy >5 days (PICC only) 09/28/23 1849  Exposed Catheter (cm) 0 cm 09/28/23 1849  Site Assessment Clean, Dry, Intact 09/28/23 1849  Lumen #1 Status Flushed;Saline locked;Blood return noted 09/28/23 1849  Lumen #2 Status Flushed;Saline locked;Blood return noted 09/28/23 1849  Dressing Type Transparent;Securing device 09/28/23 1849  Dressing Status Antimicrobial disc in place 09/28/23 1849  Line Care Connections checked and tightened 09/28/23 1849  Line Adjustment (NICU/IV Team Only) No 09/28/23 1849  Dressing Intervention New dressing;Adhesive placed at insertion site (IV team only) 09/28/23 1849  Dressing Change Due 10/05/23 09/28/23 1849       Elenore Paddy 09/28/2023, 6:52 PM

## 2023-09-28 NOTE — Plan of Care (Signed)
Following our advance care planning conversation this morning, patient's granddaughter, Marylene Land who is also HCPOA had a discussion with patient.  Marylene Land stated that patient wants everything done including intubation and ACLS meds but not chest compression.  CODE STATUS updated in epic.

## 2023-09-28 NOTE — Progress Notes (Signed)
PROGRESS NOTE  Holly [name] NWG:[id number] DOB: Nov 10, 1928   PCP: Ardith Dark, MD  Patient is from: Home  DOA: 09/25/2023 LOS: 3  Chief complaints Chief Complaint  Patient presents with   Leg Injury   Fall     Brief Narrative / Interim history: 89 y.o. F with PMH of COPD, seborrheic keratosis, hypothyroidism, anxiety and hyperlipidemia admitted for left femoral intertrochanteric and left humeral epicondylar fractures that she sustained a fall at home.    Patient underwent cephalomedullary nailing of left intertrochanteric femoral fracture by Dr. Jena Gauss on 10/28.  Ortho recommended nonoperative management for left humeral fracture.    Hospital course complicated by ABLA for which she had a total of 3 units, and AKI for which she is on IV fluids.  Therapy recommended SNF.  Subjective: Seen and examined earlier this morning.  Patient had some confusion/delirium overnight.  Currently no complaints.  Denies pain unless she moves.  Family members including son and 2 granddaughters at bedside.  Had extensive discussion about goal of care including pros and cons of CPR and intubation in light of her age and frailty.  Patient's granddaughter, Marylene Land who is also HCPOA to discuss this with patient and let us know.  Objective: Vitals:   09/28/23 0400 09/28/23 0500 09/28/23 0754 09/28/23 1100  BP: (!) 147/67  (!) 145/61 (!) 145/54  Pulse: 82     Resp: 14 16 19 15   Temp: 97.8 F (36.6 C)  97.9 F (36.6 C) 98.3 F (36.8 C)  TempSrc: Oral  Oral Oral  SpO2: 98%   96%  Weight:      Height:        Examination:  GENERAL: Very frail.  Nontoxic. HEENT: MMM.  Vision and hearing grossly intact.  NECK: Supple.  No apparent JVD.  RESP:  No IWOB.  Fair aeration bilaterally. CVS:  RRR. Heart sounds normal.  ABD/GI/GU: BS+. Abd soft, NTND.  Foley catheter in place. MSK/EXT:  Moves extremities but limited by pain on the left.  Right arm swelling from IVF.  1+ BLE edema SKIN: Surgical wound  DCI. NEURO: Awake, alert and oriented appropriately.  No apparent focal neuro deficit. PSYCH: Calm. Normal affect.   Procedures:  10/28-cephalomedullary nailing of left intertrochanteric fracture by Dr. Jena Gauss  Microbiology summarized: None  Assessment and plan: Fall at home-likely mechanical. Left femoral intertrochanteric fracture due to fall at home Left humeral epicondylar fracture due to fall at home -S/p cephalomedullary nailing of left intertrochanteric fracture by Dr. Jena Gauss on 10/28 -Nonoperative management for left humeral fracture -Scheduled Tylenol with as needed oxycodone and morphine for pain control -Subcu Lovenox discontinued due to ABLA.  Currently on ASA 81 mg daily.  -Bowel regimen -PT/OT  Acute blood loss anemia: Received a total of 3 units.  Now stable.  Recent Labs    09/25/23 2038 09/25/23 2347 09/26/23 1027 09/26/23 1405 09/26/23 2105 09/27/23 0416 09/27/23 0821 09/27/23 0950 09/27/23 2039 09/28/23 0550  HGB 7.9* 7.1* 10.9* 9.5* 8.0* 7.6* 7.0* 7.2* 8.2* 8.4*  -Continue monitoring      AKI on CKD-3A: Likely dehydration, hypotension and poor p.o. intake.  She has Foley catheter Recent Labs    04/15/23 1400 09/25/23 1630 09/26/23 1027 09/26/23 1405 09/27/23 0416 09/28/23 0550  BUN 23 25* 23 21 25* 32*  CREATININE 1.06* 1.03* 1.00 1.08* 1.36* 1.45*  -Voiding trial once able to get out of the bed   Hypotension: Resolved -Decrease midodrine to 5 mg 3 times daily  Thrombocytopenia: Stable.  Platelet count was 169,000 on 10/27.  Unlikely HIT.  Stable. -Continue monitoring  History of stroke -Low-dose aspirin as above   History of COPD: Stable -Continue inhalers   Generalized anxiety disorder   Leukocytosis: Slightly worse after interval improvement. -Continue monitoring   Hyperkalemia: Resolved.   Hypothyroidism -Continue levothyroxine.  Advance care planning: 89 year old F.  Very frail.  Comorbidity as above.  Discussed pros  and cons of CPR and intubation with patient's daughter, Marylene Land who is HCPOA.  Marylene Land to discuss this with patient.  Before making decision.  Underweight Body mass index is 17.78 kg/m. -Consult dietitian          DVT prophylaxis:  SCDs Start: 09/26/23 1310 SCDs Start: 09/25/23 2036  Code Status: Full code Family Communication: Son, granddaughters Level of care: Telemetry Medical Status is: Inpatient Remains inpatient appropriate because: Left hip fracture, AKI   Final disposition: SNF Consultants:  Orthopedic surgery  55 minutes with more than 50% spent in reviewing records, counseling patient/family and coordinating care.   Sch Meds:  Scheduled Meds:  sodium chloride   Intravenous Once   acetaminophen  650 mg Oral Q6H WA   aspirin EC  81 mg Oral Daily   atorvastatin  40 mg Oral Daily   Chlorhexidine Gluconate Cloth  6 each Topical Q0600   cholecalciferol  2,000 Units Oral Daily   docusate sodium  100 mg Oral BID   ferrous sulfate  325 mg Oral Q breakfast   levothyroxine  112 mcg Oral Q0600   midodrine  10 mg Oral TID WC   mometasone-formoterol  2 puff Inhalation BID   mupirocin ointment  1 Application Nasal BID   sodium chloride flush  3 mL Intravenous Q12H   Continuous Infusions: PRN Meds:.bisacodyl, dextrose, ipratropium-albuterol, metoCLOPramide **OR** metoCLOPramide (REGLAN) injection, morphine injection, ondansetron **OR** ondansetron (ZOFRAN) IV, oxyCODONE, polyethylene glycol, sodium chloride flush  Antimicrobials: Anti-infectives (From admission, onward)    Start     Dose/Rate Route Frequency Ordered Stop   09/26/23 2000  ceFAZolin (ANCEF) IVPB 2g/100 mL premix        2 g 200 mL/hr over 30 Minutes Intravenous Every 12 hours 09/26/23 1309 09/27/23 1040   09/26/23 1500  ceFAZolin (ANCEF) IVPB 2g/100 mL premix        2 g 200 mL/hr over 30 Minutes Intravenous On call to O.R. 09/26/23 0044 09/26/23 1152        I have personally reviewed the following  labs and images: CBC: Recent Labs  Lab 09/25/23 1721 09/25/23 2038 09/26/23 1405 09/26/23 2105 09/27/23 0416 09/27/23 0821 09/27/23 0950 09/27/23 2039 09/28/23 0550  WBC 14.9*  --  11.6*  --  8.9  --  9.2  --  12.1*  NEUTROABS  --   --   --   --   --   --  6.6  --   --   HGB 10.3*   < > 9.5*   < > 7.6* 7.0* 7.2* 8.2* 8.4*  HCT 31.6*   < > 29.3*   < > 23.1* 21.5* 21.8* 24.9* 26.2*  MCV 96.6  --  93.3  --  94.3  --  93.2  --  90.3  PLT 169  --  83*  --  72*  --  83*  --  88*   < > = values in this interval not displayed.   BMP &GFR Recent Labs  Lab 09/25/23 1630 09/26/23 1027 09/26/23 1405 09/27/23 0416 09/28/23 0550  NA 139 139 134* 135  133*  K 5.6* 4.4 4.2 4.8 4.8  CL 104 104 109 107 105  CO2 24  --  20* 22 23  GLUCOSE 125* 105* 120* 129* 125*  BUN 25* 23 21 25* 32*  CREATININE 1.03* 1.00 1.08* 1.36* 1.45*  CALCIUM 8.7*  --  7.5* 7.9* 7.7*   Estimated Creatinine Clearance: 16.9 mL/min (A) (by C-G formula based on SCr of 1.45 mg/dL (H)). Liver & Pancreas: Recent Labs  Lab 09/25/23 2038 09/26/23 1405 09/28/23 0550  AST 29 24 19   ALT 15 18 6   ALKPHOS 59 44 45  BILITOT 1.1 0.9 0.9  PROT 6.2* 4.8* 4.7*  ALBUMIN 3.4* 2.6* 2.4*   No results for input(s): "LIPASE", "AMYLASE" in the last 168 hours. No results for input(s): "AMMONIA" in the last 168 hours. Diabetic: No results for input(s): "HGBA1C" in the last 72 hours. Recent Labs  Lab 09/27/23 0013 09/27/23 0600 09/27/23 2317 09/28/23 0626 09/28/23 1239  GLUCAP 144* 124* 127* 103* 107*   Cardiac Enzymes: Recent Labs  Lab 09/25/23 2038  CKTOTAL 88   No results for input(s): "PROBNP" in the last 8760 hours. Coagulation Profile: No results for input(s): "INR", "PROTIME" in the last 168 hours. Thyroid Function Tests: No results for input(s): "TSH", "T4TOTAL", "FREET4", "T3FREE", "THYROIDAB" in the last 72 hours. Lipid Profile: No results for input(s): "CHOL", "HDL", "LDLCALC", "TRIG", "CHOLHDL",  "LDLDIRECT" in the last 72 hours. Anemia Panel: Recent Labs    09/26/23 1405 09/26/23 1406  VITAMINB12  --  241  FOLATE  --  10.9  FERRITIN 84  --   TIBC 192*  --   IRON 155  --   RETICCTPCT 1.8  --    Urine analysis:    Component Value Date/Time   COLORURINE YELLOW 04/16/2022 1517   APPEARANCEUR CLEAR 04/16/2022 1517   LABSPEC 1.010 04/16/2022 1517   PHURINE 6.0 04/16/2022 1517   GLUCOSEU NEGATIVE 04/16/2022 1517   HGBUR NEGATIVE 04/16/2022 1517   HGBUR trace-lysed 12/15/2007 0858   BILIRUBINUR NEGATIVE 04/16/2022 1517   BILIRUBINUR negative 06/05/2020 1413   KETONESUR NEGATIVE 04/16/2022 1517   PROTEINUR NEGATIVE 04/16/2022 1517   UROBILINOGEN 1.0 06/05/2020 1413   UROBILINOGEN 1.0 08/27/2011 1257   NITRITE NEGATIVE 04/16/2022 1517   LEUKOCYTESUR TRACE (A) 04/16/2022 1517   Sepsis Labs: Invalid input(s): "PROCALCITONIN", "LACTICIDVEN"  Microbiology: Recent Results (from the past 240 hour(s))  Surgical pcr screen     Status: Abnormal   Collection Time: 09/26/23  2:35 AM   Specimen: Nasal Mucosa; Nasal Swab  Result Value Ref Range Status   MRSA, PCR NEGATIVE NEGATIVE Final   Staphylococcus aureus POSITIVE (A) NEGATIVE Final    Comment: (NOTE) The Xpert SA Assay (FDA approved for NASAL specimens in patients 45 years of age and older), is one component of a comprehensive surveillance program. It is not intended to diagnose infection nor to guide or monitor treatment. Performed at [facility], [address] [place], [place] [address]     Radiology Studies: [place] EKG SITE RITE  Result Date: 09/28/2023 If Site Rite image not attached, placement could not be confirmed due to current cardiac rhythm.     Jacqualin Shirkey T. Kateryna Grantham [facility]  If 7PM-7AM, please contact night-coverage [email or web address] 09/28/2023, 3:00 PM

## 2023-09-28 NOTE — Evaluation (Signed)
Occupational Therapy Evaluation Patient Details Name: Holly [name] MRN: [id number] DOB: 1928-01-30 Today's Date: 09/28/2023   History of Present Illness Pt presenting 10/27 after fall at home resulting in L hip, head, and arm pain. CT L elbow with Acute, mildly comminuted, intra-articular fracture deformity involving the lateral humeral epicondyle. L hip xray with comminuted intertrochanteric L hip fx. Now s/p cephalomedullary nailing of L intertrochanteric femur fx 10/28. PMH significant for COPD, seborrheic keratosis, hypothyroidism, anxiety, HLD.   Clinical Impression   Pt c/o minimal pain at rest, increases with activity at L elbow and LLE. Swelling noted in B hands, L>R. Co-treat with PT, daughter present during session. Pt lives with son/daughter, daughter is there 24/7, PLOF independent with mobility/ADLs, assistance for housework. Pt currently NWB LUE, WBAT LLE, limits participation in mobility/ADLs. Mod-max for all bed mobility, STS, dressing/bathing. Pt able to use RUE for grooming/feeding with min A. Pt would benefit greatly from postacute rehab <3hrs/day to improve to functional level prior to returning home, will see acutely to progress as able.       If plan is discharge home, recommend the following: Two people to help with walking and/or transfers;A lot of help with bathing/dressing/bathroom;Assistance with cooking/housework;Assist for transportation;Help with stairs or ramp for entrance    Functional Status Assessment  Patient has had a recent decline in their functional status and demonstrates the ability to make significant improvements in function in a reasonable and predictable amount of time.  Equipment Recommendations  Other (comment) (defer)    Recommendations for Other Services       Precautions / Restrictions Precautions Precautions: Fall Restrictions Weight Bearing Restrictions: Yes LUE Weight Bearing: Non weight bearing LLE Weight Bearing: Weight bearing as  tolerated Other Position/Activity Restrictions: can WB through LUE with platform walker      Mobility Bed Mobility Overal bed mobility: Needs Assistance Bed Mobility: Supine to Sit, Sit to Supine     Supine to sit: Max assist, +2 for physical assistance, HOB elevated Sit to supine: Max assist   General bed mobility comments: max A in/out of bed for BLEs and to power sitting up    Transfers Overall transfer level: Needs assistance Equipment used: 2 person hand held assist Transfers: Sit to/from Stand Sit to Stand: Max assist, +2 physical assistance           General transfer comment: max A x2 for STS, knees buckled, would likely be able to pivot to [facility] with x2 assistance      Balance Overall balance assessment: Needs assistance Sitting-balance support: No upper extremity supported, Feet supported Sitting balance-Leahy Scale: Fair Sitting balance - Comments: little to no physical assistnace needed static sitting EOB, minimal ability to lean L/R or forward at functional level   Standing balance support: Bilateral upper extremity supported, During functional activity, Reliant on assistive device for balance Standing balance-Leahy Scale: Poor Standing balance comment: max A x2 for maintaining standing, not able to stand without therapist assistance                           ADL either performed or assessed with clinical judgement   ADL Overall ADL's : Needs assistance/impaired Eating/Feeding: Minimal assistance;Sitting   Grooming: Minimal assistance;Sitting   Upper Body Bathing: Moderate assistance;Sitting   Lower Body Bathing: Maximal assistance;Sitting/lateral leans   Upper Body Dressing : Moderate assistance;Sitting   Lower Body Dressing: Maximal assistance;Sitting/lateral leans   Toilet Transfer: Maximal assistance;+2 for physical assistance;BSC/3in1  Toileting- Clothing Manipulation and Hygiene: Maximal assistance;Sitting/lateral lean          General ADL Comments: Pt max A x2 for STS and pivot to BSC, B knees buckling during standing, minimal weight on LLE. Pt mod-max for all dressing/bathing, min A for feeding/grooming.     Vision Baseline Vision/History: 6 Macular Degeneration Ability to See in Adequate Light: 1 Impaired Patient Visual Report: No change from baseline       Perception         Praxis         Pertinent Vitals/Pain Pain Assessment Pain Assessment: Faces Faces Pain Scale: Hurts even more Pain Location: L elbow, LLE with activity Pain Descriptors / Indicators: Aching, Discomfort, Grimacing Pain Intervention(s): Monitored during session     Extremity/Trunk Assessment Upper Extremity Assessment Upper Extremity Assessment: LUE deficits/detail LUE Deficits / Details: L lateral condyle fx, conservative management, NWB, able to use platform walker as tolerated LUE: Shoulder pain with ROM LUE Sensation: WNL LUE Coordination: decreased gross motor;decreased fine motor   Lower Extremity Assessment Lower Extremity Assessment: Defer to PT evaluation       Communication Communication Communication: Hearing impairment   Cognition Arousal: Alert Behavior During Therapy: WFL for tasks assessed/performed Overall Cognitive Status: Within Functional Limits for tasks assessed                                       General Comments       Exercises     Shoulder Instructions      Home Living Family/patient expects to be discharged to:: Private residence Living Arrangements: Children Available Help at Discharge: Family;Available 24 hours/day Type of Home: House Home Access: Stairs to enter [employer] of Steps: 5 Entrance Stairs-Rails: Can reach both Home Layout: Two level;Able to live on main level with bedroom/bathroom Alternate Level Stairs-Number of Steps: flight   Bathroom Shower/Tub: Tub/shower unit         Home Equipment: Agricultural consultant (2 wheels);Shower  seat;Cane - single point   Additional Comments: Pt lives with son and daughter, daughter is available 24/7      Prior Functioning/Environment Prior Level of Function : Independent/Modified Independent             Mobility Comments: ind, no AD ADLs Comments: ind, daughter cooks/cleans        OT Problem List: Decreased strength;Decreased range of motion;Decreased activity tolerance;Impaired balance (sitting and/or standing);Impaired vision/perception;Decreased safety awareness;Impaired UE functional use;Pain;Increased edema      OT Treatment/Interventions: Self-care/ADL training;Therapeutic exercise;Energy conservation;DME and/or AE instruction;Manual therapy;Therapeutic activities;Patient/family education;Balance training    OT Goals(Current goals can be found in the care plan section) Acute Rehab OT Goals Patient Stated Goal: to improve strength OT Goal Formulation: With patient/family Time For Goal Achievement: 10/12/23 Potential to Achieve Goals: Good  OT Frequency: Min 1X/week    Co-evaluation PT/OT/SLP Co-Evaluation/Treatment: Yes Reason for Co-Treatment: Complexity of the patient's impairments (multi-system involvement);For patient/therapist safety;To address functional/ADL transfers   OT goals addressed during session: ADL's and self-care;Proper use of Adaptive equipment and DME      AM-PAC OT "6 Clicks" Daily Activity     Outcome Measure Help from another person eating meals?: A Little Help from another person taking care of personal grooming?: A Little Help from another person toileting, which includes using toliet, bedpan, or urinal?: A Lot Help from another person bathing (including washing, rinsing, drying)?: A Lot Help from  another person to put on and taking off regular upper body clothing?: A Lot Help from another person to put on and taking off regular lower body clothing?: A Lot 6 Click Score: 14   End of Session Equipment Utilized During Treatment:  Gait belt Nurse Communication: Mobility status;Other (comment) (R IV slightly bleeding, controlled, did not get worse throughout session, RN informed)  Activity Tolerance: Patient tolerated treatment well Patient left: in bed;with call bell/phone within reach;with bed alarm set;with family/visitor present  OT Visit Diagnosis: Unsteadiness on feet (R26.81);Other abnormalities of gait and mobility (R26.89);Muscle weakness (generalized) (M62.81);History of falling (Z91.81);Pain Pain - Right/Left: Left Pain - part of body: Leg;Arm                Time: 6644-0347 OT Time Calculation (min): 31 min Charges:  OT General Charges $OT Visit: 1 Visit OT Evaluation $OT Eval Moderate Complexity: 1 [address], OTR/L   Alexis Goodell 09/28/2023, 12:48 PM

## 2023-09-29 DIAGNOSIS — S72002A Fracture of unspecified part of neck of left femur, initial encounter for closed fracture: Secondary | ICD-10-CM | POA: Diagnosis not present

## 2023-09-29 DIAGNOSIS — I10 Essential (primary) hypertension: Secondary | ICD-10-CM | POA: Diagnosis not present

## 2023-09-29 DIAGNOSIS — S42432A Displaced fracture (avulsion) of lateral epicondyle of left humerus, initial encounter for closed fracture: Secondary | ICD-10-CM | POA: Diagnosis not present

## 2023-09-29 DIAGNOSIS — W19XXXA Unspecified fall, initial encounter: Secondary | ICD-10-CM | POA: Diagnosis not present

## 2023-09-29 LAB — RENAL FUNCTION PANEL
Albumin: 2 g/dL — ABNORMAL LOW (ref 3.5–5.0)
Anion gap: 6 (ref 5–15)
BUN: 30 mg/dL — ABNORMAL HIGH (ref 8–23)
CO2: 22 mmol/L (ref 22–32)
Calcium: 7.8 mg/dL — ABNORMAL LOW (ref 8.9–10.3)
Chloride: 108 mmol/L (ref 98–111)
Creatinine, Ser: 1.05 mg/dL — ABNORMAL HIGH (ref 0.44–1.00)
GFR, Estimated: 50 mL/min — ABNORMAL LOW (ref >60)
Glucose, Bld: 96 mg/dL (ref 70–99)
Phosphorus: 2.4 mg/dL — ABNORMAL LOW (ref 2.5–4.6)
Potassium: 3.7 mmol/L (ref 3.5–5.1)
Sodium: 136 mmol/L (ref 135–145)

## 2023-09-29 LAB — CBC
HCT: 21.3 % — ABNORMAL LOW (ref 36.0–46.0)
HCT: 22.3 % — ABNORMAL LOW (ref 36.0–46.0)
Hemoglobin: 7.1 g/dL — ABNORMAL LOW (ref 12.0–15.0)
Hemoglobin: 7.3 g/dL — ABNORMAL LOW (ref 12.0–15.0)
MCH: 30 pg (ref 26.0–34.0)
MCH: 29.8 pg (ref 26.0–34.0)
MCHC: 33.3 g/dL (ref 30.0–36.0)
MCHC: 32.7 g/dL (ref 30.0–36.0)
MCV: 89.9 fL (ref 80.0–100.0)
MCV: 91 fL (ref 80.0–100.0)
Platelets: 101 10*3/uL — ABNORMAL LOW (ref 150–400)
Platelets: 116 10*3/uL — ABNORMAL LOW (ref 150–400)
RBC: 2.37 MIL/uL — ABNORMAL LOW (ref 3.87–5.11)
RBC: 2.45 MIL/uL — ABNORMAL LOW (ref 3.87–5.11)
RDW: 19.1 % — ABNORMAL HIGH (ref 11.5–15.5)
RDW: 18.7 % — ABNORMAL HIGH (ref 11.5–15.5)
WBC: 8.1 10*3/uL (ref 4.0–10.5)
WBC: 8.2 10*3/uL (ref 4.0–10.5)
nRBC: 0.2 % (ref 0.0–0.2)
nRBC: 0 % (ref 0.0–0.2)

## 2023-09-29 LAB — GLUCOSE, CAPILLARY
Glucose-Capillary: 98 mg/dL (ref 70–99)
Glucose-Capillary: 126 mg/dL — ABNORMAL HIGH (ref 70–99)
Glucose-Capillary: 99 mg/dL (ref 70–99)
Glucose-Capillary: 108 mg/dL — ABNORMAL HIGH (ref 70–99)

## 2023-09-29 LAB — PREPARE RBC (CROSSMATCH)

## 2023-09-29 LAB — MAGNESIUM: Magnesium: 1.9 mg/dL (ref 1.7–2.4)

## 2023-09-29 LAB — HEMOGLOBIN AND HEMATOCRIT, BLOOD
HCT: 25.5 % — ABNORMAL LOW (ref 36.0–46.0)
Hemoglobin: 8.3 g/dL — ABNORMAL LOW (ref 12.0–15.0)

## 2023-09-29 MED ORDER — SENNOSIDES-DOCUSATE SODIUM 8.6-50 MG PO TABS
1.0000 | ORAL_TABLET | Freq: Every day | ORAL | Status: DC
  Administered 2023-09-29 – 2023-10-01 (×3): 1 via ORAL
  Filled 2023-09-29 (×3): qty 1

## 2023-09-29 MED ORDER — FUROSEMIDE 10 MG/ML IJ SOLN
20.0000 mg | Freq: Once | INTRAMUSCULAR | Status: AC
  Administered 2023-09-29: 20 mg via INTRAVENOUS
  Filled 2023-09-29: qty 2

## 2023-09-29 MED ORDER — POLYETHYLENE GLYCOL 3350 17 G PO PACK: 17.0000 g | PACK | Freq: Two times a day (BID) | ORAL | Status: DC | PRN

## 2023-09-29 MED ORDER — SODIUM CHLORIDE 0.9% IV SOLUTION: Freq: Once | INTRAVENOUS | Status: AC

## 2023-09-29 MED ORDER — POLYETHYLENE GLYCOL 3350 17 G PO PACK
17.0000 g | PACK | Freq: Once | ORAL | Status: AC
  Administered 2023-09-29: 17 g via ORAL
  Filled 2023-09-29: qty 1

## 2023-09-29 NOTE — Plan of Care (Signed)
  Problem: Activity: Goal: Risk for activity intolerance will decrease Outcome: Not Progressing   Problem: Pain Management: Goal: General experience of comfort will improve Outcome: Not Progressing

## 2023-09-29 NOTE — Progress Notes (Signed)
PROGRESS NOTE  Holly [name]:[id number] DOB: 1928/09/10   PCP: Ardith Dark, MD  Patient is from: Home  DOA: 09/25/2023 LOS: 4  Chief complaints Chief Complaint  Patient presents with   Leg Injury   Fall     Brief Narrative / Interim history: 89 y.o. F with PMH of COPD, seborrheic keratosis, hypothyroidism, anxiety and hyperlipidemia admitted for left femoral intertrochanteric and left humeral epicondylar fractures that she sustained a fall at home.    Patient underwent cephalomedullary nailing of left intertrochanteric femoral fracture by Dr. Jena Gauss on 10/28.  Ortho recommended nonoperative management for left humeral fracture.    Hospital course complicated by postoperative ABLA and AKI.  She received 3 units so far.  Hemoglobin drifted down to 7.1 again.  No overt bleeding.  Additional 1 unit ordered.  AKI resolved with IV fluid.  Therapy recommended SNF.  Subjective: Seen and examined earlier this morning.  No major events overnight of this morning.  Has no complaints other than some back pain.  Back pain is not acute but slightly worse from baseline.  She reports old back injury.  No pain at fracture site unless she moves them.  Granddaughter at bedside.  Since she is tired.   Objective: Vitals:   09/29/23 1207 09/29/23 1240 09/29/23 1259 09/29/23 1342  BP: (!) 139/50 (!) 133/55 (!) 133/52   Pulse:   92   Resp: (!) 21 20 (!) 21 20  Temp: 98.5 F (36.9 C) 98 F (36.7 C) 98.7 F (37.1 C)   TempSrc: Oral Oral Oral   SpO2: 93% 93% 92%   Weight:      Height:        Examination:  GENERAL: Very frail.  Nontoxic. HEENT: MMM.  Vision and hearing grossly intact.  NECK: Supple.  No apparent JVD.  RESP:  No IWOB.  Fair aeration bilaterally. CVS:  RRR. Heart sounds normal.  ABD/GI/GU: BS+. Abd soft, NTND.  Foley catheter in place. MSK/EXT:  Moves extremities but limited by pain on the left.  Right arm swelling from IVF.  1+ BLE edema SKIN: Surgical wound DCI. NEURO:  Awake, alert and oriented appropriately.  No apparent focal neuro deficit. PSYCH: Calm. Normal affect.   Procedures:  10/28-cephalomedullary nailing of left intertrochanteric fracture by Dr. Jena Gauss  Microbiology summarized: None  Assessment and plan: Fall at home-likely mechanical. Left femoral intertrochanteric fracture due to fall at home Left humeral epicondylar fracture due to fall at home -S/p cephalomedullary nailing of left intertrochanteric fracture by Dr. Jena Gauss on 10/28 -Nonoperative management for left humeral fracture -Scheduled Tylenol with as needed oxycodone and morphine for pain control -Subcu Lovenox discontinued due to ABLA.  Currently on ASA 81 mg daily.  -Bowel regimen -PT/OT  Acute blood loss anemia: Received a total of 3 units.  Hgb drifted down again.  No ecchymosis or hematoma at surgical site.  Doubt GI bleed without bowel movements.  She is somewhat fatigued.  Recent Labs    09/26/23 1027 09/26/23 1405 09/26/23 2105 09/27/23 0416 09/27/23 0821 09/27/23 0950 09/27/23 2039 09/28/23 0550 09/29/23 0320 09/29/23 0900  HGB 10.9* 9.5* 8.0* 7.6* 7.0* 7.2* 8.2* 8.4* 7.1* 7.3*  -Transfuse additional 1 unit -Monitor H&H     AKI on CKD-3A: Likely dehydration, hypotension and poor p.o. intake.  Resolved. Recent Labs    04/15/23 1400 09/25/23 1630 09/26/23 1027 09/26/23 1405 09/27/23 0416 09/28/23 0550 09/29/23 0320  BUN 23 25* 23 21 25* 32* 30*  CREATININE 1.06* 1.03* 1.00  1.08* 1.36* 1.45* 1.05*  -Voiding trial today. -Recheck renal function in the morning   Hypotension: Resolved.  Now with elevated blood pressure. -Discontinue midodrine  Thrombocytopenia: Stable. Platelet count was 169,000 on 10/27.  Unlikely HIT.  Improving. -Continue monitoring  History of stroke -Low-dose aspirin as above   History of COPD: Stable -Continue inhalers   Generalized anxiety disorder: Seems to be on Ativan at home.  Did not require here -Monitor    Leukocytosis: Likely demargination.  Resolved without antibiotics.   Hyperkalemia: Resolved.   Hypothyroidism -Continue levothyroxine.  Advance care planning: Full code except CPR.  See discussion from 10/30  Underweight Body mass index is 17.78 kg/m. -Consult dietitian          DVT prophylaxis:  SCDs Start: 09/26/23 1310 SCDs Start: 09/25/23 2036  Code Status: Full code Family Communication: Updated patient's son, daughter and granddaughter at bedside Level of care: Telemetry Medical Status is: Inpatient Remains inpatient appropriate because: Left hip fracture, AKI   Final disposition: SNF Consultants:  Orthopedic surgery  55 minutes with more than 50% spent in reviewing records, counseling patient/family and coordinating care.   Sch Meds:  Scheduled Meds:  sodium chloride   Intravenous Once   acetaminophen  650 mg Oral Q6H WA   aspirin EC  81 mg Oral Daily   atorvastatin  40 mg Oral Daily   Chlorhexidine Gluconate Cloth  6 each Topical Q0600   cholecalciferol  2,000 Units Oral Daily   docusate sodium  100 mg Oral BID   ferrous sulfate  325 mg Oral Q breakfast   levothyroxine  112 mcg Oral Q0600   mometasone-formoterol  2 puff Inhalation BID   mupirocin ointment  1 Application Nasal BID   sodium chloride flush  10-40 mL Intracatheter Q12H   Continuous Infusions: PRN Meds:.bisacodyl, dextrose, ipratropium-albuterol, metoCLOPramide **OR** metoCLOPramide (REGLAN) injection, morphine injection, ondansetron **OR** ondansetron (ZOFRAN) IV, oxyCODONE, polyethylene glycol, sodium chloride flush  Antimicrobials: Anti-infectives (From admission, onward)    Start     Dose/Rate Route Frequency Ordered Stop   09/26/23 2000  ceFAZolin (ANCEF) IVPB 2g/100 mL premix        2 g 200 mL/hr over 30 Minutes Intravenous Every 12 hours 09/26/23 1309 09/27/23 1040   09/26/23 1500  ceFAZolin (ANCEF) IVPB 2g/100 mL premix        2 g 200 mL/hr over 30 Minutes Intravenous On  call to O.R. 09/26/23 0044 09/26/23 1152        I have personally reviewed the following labs and images: CBC: Recent Labs  Lab 09/27/23 0416 09/27/23 0821 09/27/23 0950 09/27/23 2039 09/28/23 0550 09/29/23 0320 09/29/23 0900  WBC 8.9  --  9.2  --  12.1* 8.1 8.2  NEUTROABS  --   --  6.6  --   --   --   --   HGB 7.6*   < > 7.2* 8.2* 8.4* 7.1* 7.3*  HCT 23.1*   < > 21.8* 24.9* 26.2* 21.3* 22.3*  MCV 94.3  --  93.2  --  90.3 89.9 91.0  PLT 72*  --  83*  --  88* 101* 116*   < > = values in this interval not displayed.   BMP &GFR Recent Labs  Lab 09/25/23 1630 09/26/23 1027 09/26/23 1405 09/27/23 0416 09/28/23 0550 09/29/23 0320  NA 139 139 134* 135 133* 136  K 5.6* 4.4 4.2 4.8 4.8 3.7  CL 104 104 109 107 105 108  CO2 24  --  20*  22 23 22   GLUCOSE 125* 105* 120* 129* 125* 96  BUN 25* 23 21 25* 32* 30*  CREATININE 1.03* 1.00 1.08* 1.36* 1.45* 1.05*  CALCIUM 8.7*  --  7.5* 7.9* 7.7* 7.8*  MG  --   --   --   --   --  1.9  PHOS  --   --   --   --   --  2.4*   Estimated Creatinine Clearance: 23.3 mL/min (A) (by C-G formula based on SCr of 1.05 mg/dL (H)). Liver & Pancreas: Recent Labs  Lab 09/25/23 2038 09/26/23 1405 09/28/23 0550 09/29/23 0320  AST 29 24 19   --   ALT 15 18 6   --   ALKPHOS 59 44 45  --   BILITOT 1.1 0.9 0.9  --   PROT 6.2* 4.8* 4.7*  --   ALBUMIN 3.4* 2.6* 2.4* 2.0*   No results for input(s): "LIPASE", "AMYLASE" in the last 168 hours. No results for input(s): "AMMONIA" in the last 168 hours. Diabetic: No results for input(s): "HGBA1C" in the last 72 hours. Recent Labs  Lab 09/28/23 1239 09/28/23 1845 09/28/23 2326 09/29/23 0604 09/29/23 1207  GLUCAP 107* 101* 108* 98 126*   Cardiac Enzymes: Recent Labs  Lab 09/25/23 2038  CKTOTAL 88   No results for input(s): "PROBNP" in the last 8760 hours. Coagulation Profile: No results for input(s): "INR", "PROTIME" in the last 168 hours. Thyroid Function Tests: No results for input(s):  "TSH", "T4TOTAL", "FREET4", "T3FREE", "THYROIDAB" in the last 72 hours. Lipid Profile: No results for input(s): "CHOL", "HDL", "LDLCALC", "TRIG", "CHOLHDL", "LDLDIRECT" in the last 72 hours. Anemia Panel: Recent Labs    09/26/23 1405 09/26/23 1406  VITAMINB12  --  241  FOLATE  --  10.9  FERRITIN 84  --   TIBC 192*  --   IRON 155  --   RETICCTPCT 1.8  --    Urine analysis:    Component Value Date/Time   COLORURINE YELLOW 04/16/2022 1517   APPEARANCEUR CLEAR 04/16/2022 1517   LABSPEC 1.010 04/16/2022 1517   PHURINE 6.0 04/16/2022 1517   GLUCOSEU NEGATIVE 04/16/2022 1517   HGBUR NEGATIVE 04/16/2022 1517   HGBUR trace-lysed 12/15/2007 0858   BILIRUBINUR NEGATIVE 04/16/2022 1517   BILIRUBINUR negative 06/05/2020 1413   KETONESUR NEGATIVE 04/16/2022 1517   PROTEINUR NEGATIVE 04/16/2022 1517   UROBILINOGEN 1.0 06/05/2020 1413   UROBILINOGEN 1.0 08/27/2011 1257   NITRITE NEGATIVE 04/16/2022 1517   LEUKOCYTESUR TRACE (A) 04/16/2022 1517   Sepsis Labs: Invalid input(s): "PROCALCITONIN", "LACTICIDVEN"  Microbiology: Recent Results (from the past 240 hour(s))  Surgical pcr screen     Status: Abnormal   Collection Time: 09/26/23  2:35 AM   Specimen: Nasal Mucosa; Nasal Swab  Result Value Ref Range Status   MRSA, PCR NEGATIVE NEGATIVE Final   Staphylococcus aureus POSITIVE (A) NEGATIVE Final    Comment: (NOTE) The Xpert SA Assay (FDA approved for NASAL specimens in patients 21 years of age and older), is one component of a comprehensive surveillance program. It is not intended to diagnose infection nor to guide or monitor treatment. Performed at [facility], [address], [place], [place] [address]     Radiology Studies: [place] EKG SITE RITE  Result Date: 09/28/2023 If Site Rite image not attached, placement could not be confirmed due to current cardiac rhythm.  [place] EKG SITE RITE  Result Date: 09/28/2023 If Site Rite image not attached, placement could not be  confirmed due to  current cardiac rhythm.     Destenee Guerry T. Emmakate Hypes [facility]  If 7PM-7AM, please contact night-coverage [email or web address] 09/29/2023, 1:47 PM

## 2023-09-29 NOTE — Progress Notes (Signed)
   09/29/23 1839  Output (mL)  Urine 100 mL  Urine Characteristics  Bladder Scan Volume (mL) 299 mL   Dr. Alanda Slim notified

## 2023-09-29 NOTE — NC FL2 (Signed)
[place] MEDICAID FL2 LEVEL OF CARE FORM     IDENTIFICATION  Patient Name: Holly [name] Birthdate: 01/21/1928 Sex: female Admission Date (Current Location): 09/25/2023  [facility] and [place] Number:  Producer, television/film/video and Address:  [facility] [place] [address], [place], [place] [address]      Provider Number: [id number]  Attending Physician Name and Address:  Almon Hercules, MD  Relative Name and Phone Number:       Current Level of Care: Hospital Recommended Level of Care: Skilled Nursing Facility Prior Approval Number:    Date Approved/Denied:   PASRR Number: [id number]  Discharge Plan: SNF    Current Diagnoses: Patient Active Problem List   Diagnosis Date Noted   Closed left hip fracture (HCC) 09/25/2023   Displaced fracture (avulsion) of lateral epicondyle of left humerus, initial encounter for closed fracture 09/25/2023   Transient hypotension 09/25/2023   Essential hypertension 09/25/2023   History of COPD 09/25/2023   GAD (generalized anxiety disorder) 09/25/2023   Fall at home, initial encounter 09/25/2023   Hyperkalemia 09/25/2023   CKD stage 3a, GFR 45-59 ml/min (HCC) 09/25/2023   Fall 09/25/2023   Normocytic anemia 09/25/2023   History of CVA (cerebrovascular accident) 09/25/2023   Comminuted left humeral fracture, closed, initial encounter 09/25/2023   Seborrheic keratoses 04/27/2022   Rhinitis 10/10/2020   Closed wedge compression fracture of T11 vertebra (HCC) 05/02/2020   Weight loss 11/19/2019   Mild cognitive impairment 07/06/2019   Leg edema 11/13/2018   Senile purpura (HCC) 09/07/2018   Macular degeneration 09/05/2018   Palpitations 09/05/2018   CVA (cerebral vascular accident) (HCC) 08/30/2018   GERD 12/25/2007   Hypothyroidism 05/18/2007   Hyperlipidemia 05/18/2007   COPD mixed type (HCC) 05/18/2007    Orientation RESPIRATION BLADDER Height & Weight     Self, Time, Situation, Place  Normal Continent  Weight: 97 lb 3.6 oz (44.1 kg) Height:  5\' 2"  (157.5 cm)  BEHAVIORAL SYMPTOMS/MOOD NEUROLOGICAL BOWEL NUTRITION STATUS      Continent Diet (please see discharge summary)  AMBULATORY STATUS COMMUNICATION OF NEEDS Skin     Verbally Surgical wounds (closed incision Hip,LF)                       Personal Care Assistance Level of Assistance  Bathing, Feeding, Dressing Bathing Assistance: Maximum assistance Feeding assistance: Independent Dressing Assistance: Maximum assistance     Functional Limitations Info  Sight, Hearing, Speech Sight Info: Impaired Hearing Info: Impaired Speech Info: Adequate    SPECIAL CARE FACTORS FREQUENCY  PT (By licensed PT), OT (By licensed OT)     PT Frequency: 5x per week OT Frequency: 5x per week            Contractures Contractures Info: Not present    Additional Factors Info  Code Status, Allergies Code Status Info: DNR Allergies Info: Levofloxacin,Macrobid,Sulfamethoxazole,Tramadol,Amoxicillin           Current Medications (09/29/2023):  This is the current hospital active medication list Current Facility-Administered Medications  Medication Dose Route Frequency Provider Last Rate Last Admin   0.9 %  sodium chloride infusion (Manually program via Guardrails IV Fluids)   Intravenous Once Thyra Breed A, PA-C       0.9 %  sodium chloride infusion (Manually program via Guardrails IV Fluids)   Intravenous Once Almon Hercules, MD       acetaminophen (TYLENOL) tablet 650 mg  650 mg Oral Q6H WA Candelaria Stagers  T, MD   650 mg at 09/29/23 0900   aspirin EC tablet 81 mg  81 mg Oral Daily West Bali, PA-C   81 mg at 09/29/23 0849   atorvastatin (LIPITOR) tablet 40 mg  40 mg Oral Daily West Bali, PA-C   40 mg at 09/29/23 0848   bisacodyl (DULCOLAX) EC tablet 5 mg  5 mg Oral Daily PRN West Bali, PA-C       Chlorhexidine Gluconate Cloth 2 % PADS 6 each  6 each Topical Q0600 West Bali, PA-C   6 each at 09/29/23 2952    cholecalciferol (VITAMIN D3) 25 MCG (1000 UNIT) tablet 2,000 Units  2,000 Units Oral Daily West Bali, PA-C   2,000 Units at 09/29/23 0848   dextrose 50 % solution 50 mL  1 ampule Intravenous PRN West Bali, PA-C       docusate sodium (COLACE) capsule 100 mg  100 mg Oral BID West Bali, PA-C   100 mg at 09/29/23 0848   ferrous sulfate tablet 325 mg  325 mg Oral Q breakfast West Bali, PA-C   325 mg at 09/29/23 0848   furosemide (LASIX) injection 20 mg  20 mg Intravenous Once Gonfa, Taye T, MD       ipratropium-albuterol (DUONEB) 0.5-2.5 (3) MG/3ML nebulizer solution 3 mL  3 mL Nebulization Q4H PRN West Bali, PA-C       levothyroxine (SYNTHROID) tablet 112 mcg  112 mcg Oral Q0600 West Bali, PA-C   112 mcg at 09/29/23 0848   metoCLOPramide (REGLAN) tablet 5-10 mg  5-10 mg Oral Q8H PRN West Bali, PA-C       Or   metoCLOPramide (REGLAN) injection 5-10 mg  5-10 mg Intravenous Q8H PRN West Bali, PA-C       mometasone-formoterol (DULERA) 200-5 MCG/ACT inhaler 2 puff  2 puff Inhalation BID West Bali, PA-C   2 puff at 09/29/23 0757   morphine (PF) 2 MG/ML injection 0.5-1 mg  0.5-1 mg Intravenous Q2H PRN West Bali, PA-C   0.5 mg at 09/28/23 8413   mupirocin ointment (BACTROBAN) 2 % 1 Application  1 Application Nasal BID West Bali, PA-C   1 Application at 09/29/23 2440   ondansetron (ZOFRAN) tablet 4 mg  4 mg Oral Q6H PRN West Bali, PA-C       Or   ondansetron (ZOFRAN) injection 4 mg  4 mg Intravenous Q6H PRN Thyra Breed A, PA-C       oxyCODONE (Oxy IR/ROXICODONE) immediate release tablet 5 mg  5 mg Oral Q4H PRN West Bali, PA-C   5 mg at 09/27/23 2028   polyethylene glycol (MIRALAX / GLYCOLAX) packet 17 g  17 g Oral Daily PRN West Bali, PA-C   17 g at 09/28/23 1252   sodium chloride flush (NS) 0.9 % injection 10-40 mL  10-40 mL Intracatheter Q12H Gonfa, Taye T, MD   20 mL at 09/29/23 0853   sodium chloride flush  (NS) 0.9 % injection 10-40 mL  10-40 mL Intracatheter PRN Almon Hercules, MD         Discharge Medications: Please see discharge summary for a list of discharge medications.  Relevant Imaging Results:  Relevant Lab Results:   Additional Information SSN [id number]  Eduard Roux, LCSW

## 2023-09-29 NOTE — TOC Transition Note (Signed)
Transition of Care [facility]) - CM/SW Discharge Note   Patient Details  Name: Holly [name] MRN: [id number] Date of Birth: Jun 17, 1928  [facility] CM/SW Contact:  Eduard Roux, LCSW Phone Number: 09/29/2023, 10:50 AM   Clinical Narrative:     CSW spoke with patient's granddaughter,Angela(HCPOA). CSW introduced self and explained role. Patient lives in the home with her children. CSW discussed with family, recommendations for short term rehab at [facility]. CSW explained the SNF process and family was agreeable to begin the SNF search. Preferred SNF [facility]. All questions answered.  TOC will provide SNF bed offers once available.   Antony Blackbird, MSW, LCSW Clinical Social Worker    Final next level of care: Skilled Nursing Facility     Patient Goals and CMS Choice      Discharge Placement                         Discharge Plan and Services Additional resources added to the After Visit Summary for   In-house Referral: Clinical Social Work                                   Social Determinants of Health (SDOH) Interventions SDOH Screenings   Food Insecurity: No Food Insecurity (08/15/2023)  Housing: Low Risk  (08/15/2023)  Transportation Needs: No Transportation Needs (08/15/2023)  Utilities: Not At Risk (08/15/2023)  Alcohol Screen: Low Risk  (08/09/2022)  Depression (PHQ2-9): Low Risk  (08/15/2023)  Recent Concern: Depression (PHQ2-9) - High Risk (07/20/2023)  Financial Resource Strain: Low Risk  (08/15/2023)  Physical Activity: Insufficiently Active (08/15/2023)  Social Connections: Moderately Integrated (08/15/2023)  Stress: No Stress Concern Present (08/15/2023)  Tobacco Use: Medium Risk (09/26/2023)  Health Literacy: Inadequate Health Literacy (08/15/2023)     Readmission Risk Interventions     No data to display

## 2023-09-29 NOTE — Plan of Care (Signed)
  Problem: Activity: Goal: Risk for activity intolerance will decrease Outcome: Not Progressing   

## 2023-09-30 DIAGNOSIS — W19XXXA Unspecified fall, initial encounter: Secondary | ICD-10-CM | POA: Diagnosis not present

## 2023-09-30 DIAGNOSIS — S72002A Fracture of unspecified part of neck of left femur, initial encounter for closed fracture: Secondary | ICD-10-CM | POA: Diagnosis not present

## 2023-09-30 DIAGNOSIS — S42432A Displaced fracture (avulsion) of lateral epicondyle of left humerus, initial encounter for closed fracture: Secondary | ICD-10-CM | POA: Diagnosis not present

## 2023-09-30 DIAGNOSIS — I10 Essential (primary) hypertension: Secondary | ICD-10-CM | POA: Diagnosis not present

## 2023-09-30 LAB — RENAL FUNCTION PANEL
Albumin: 1.9 g/dL — ABNORMAL LOW (ref 3.5–5.0)
Anion gap: 9 (ref 5–15)
BUN: 29 mg/dL — ABNORMAL HIGH (ref 8–23)
CO2: 22 mmol/L (ref 22–32)
Calcium: 7.5 mg/dL — ABNORMAL LOW (ref 8.9–10.3)
Chloride: 103 mmol/L (ref 98–111)
Creatinine, Ser: 1 mg/dL (ref 0.44–1.00)
GFR, Estimated: 53 mL/min — ABNORMAL LOW (ref >60)
Glucose, Bld: 103 mg/dL — ABNORMAL HIGH (ref 70–99)
Phosphorus: 2.6 mg/dL (ref 2.5–4.6)
Potassium: 3.6 mmol/L (ref 3.5–5.1)
Sodium: 134 mmol/L — ABNORMAL LOW (ref 135–145)

## 2023-09-30 LAB — BPAM RBC
Blood Product Expiration Date: 202411202359
ISSUE DATE / TIME: 202410311230
Unit Type and Rh: 5100

## 2023-09-30 LAB — TYPE AND SCREEN
ABO/RH(D): O POS
Antibody Screen: NEGATIVE
Unit division: 0

## 2023-09-30 LAB — CBC
HCT: 25.2 % — ABNORMAL LOW (ref 36.0–46.0)
Hemoglobin: 8.2 g/dL — ABNORMAL LOW (ref 12.0–15.0)
MCH: 29 pg (ref 26.0–34.0)
MCHC: 32.5 g/dL (ref 30.0–36.0)
MCV: 89 fL (ref 80.0–100.0)
Platelets: 142 10*3/uL — ABNORMAL LOW (ref 150–400)
RBC: 2.83 MIL/uL — ABNORMAL LOW (ref 3.87–5.11)
RDW: 18.2 % — ABNORMAL HIGH (ref 11.5–15.5)
WBC: 7.1 10*3/uL (ref 4.0–10.5)
nRBC: 0.3 % — ABNORMAL HIGH (ref 0.0–0.2)

## 2023-09-30 LAB — GLUCOSE, CAPILLARY
Glucose-Capillary: 108 mg/dL — ABNORMAL HIGH (ref 70–99)
Glucose-Capillary: 90 mg/dL (ref 70–99)
Glucose-Capillary: 93 mg/dL (ref 70–99)

## 2023-09-30 LAB — MAGNESIUM: Magnesium: 1.8 mg/dL (ref 1.7–2.4)

## 2023-09-30 MED ORDER — SODIUM CHLORIDE 0.9 % IV BOLUS
500.0000 mL | Freq: Once | INTRAVENOUS | Status: AC
  Administered 2023-09-30: 500 mL via INTRAVENOUS

## 2023-09-30 MED ORDER — ENSURE ENLIVE PO LIQD: 237.0000 mL | ORAL | Status: DC

## 2023-09-30 MED ORDER — CHLORHEXIDINE GLUCONATE CLOTH 2 % EX PADS
6.0000 | MEDICATED_PAD | Freq: Every day | CUTANEOUS | Status: DC
  Administered 2023-09-30 – 2023-10-01 (×2): 6 via TOPICAL

## 2023-09-30 MED ORDER — SENNOSIDES-DOCUSATE SODIUM 8.6-50 MG PO TABS: 1.0000 | ORAL_TABLET | Freq: Two times a day (BID) | ORAL | Status: DC | PRN

## 2023-09-30 MED ORDER — METOPROLOL TARTRATE 12.5 MG HALF TABLET
12.5000 mg | ORAL_TABLET | Freq: Two times a day (BID) | ORAL | Status: DC
  Administered 2023-09-30 – 2023-10-01 (×3): 12.5 mg via ORAL
  Filled 2023-09-30 (×3): qty 1

## 2023-09-30 NOTE — Progress Notes (Signed)
Initial Nutrition Assessment  DOCUMENTATION CODES:   Underweight  INTERVENTION:  Continue regular diet as ordered Ensure Enlive po once daily, each supplement provides 350 kcal and 20 grams of protein. Magic cup TID with meals, each supplement provides 290 kcal and 9 grams of protein  NUTRITION DIAGNOSIS:   Increased nutrient needs related to hip fracture as evidenced by estimated needs.  GOAL:   Patient will meet greater than or equal to 90% of their needs  MONITOR:   PO intake, Supplement acceptance, Labs, Weight trends, Skin  REASON FOR ASSESSMENT:   Consult Assessment of nutrition requirement/status  ASSESSMENT:   Pt admitted after a fall leading to L femoral intertrochanteric and L humeral epicondylar fractures. PMH significant for COPD, seborrheic keratosis, hypothyroidism, anxiety and HLD.  10/28 s/p CM nailing of L femoral fracture; non-op management of L humeral fracture  Attempted to meet with pt at bedside however pt with other provider at time of visit. Unable to obtain detailed nutrition related history at this time. Noted to be medically stable for discharge pending placement.   No documented meal completions on file to review during admission.   Review of weight history over the last year reflects pt with a stable weight of 38-39 kg.   Medications: Vitamin D3 2000 units daily, ferrous sulfate, senna once daily  Labs:  sodium 134, BUN 29, GFR 53, CBG's 90-126 x24 hours  NUTRITION - FOCUSED PHYSICAL EXAM: Pt with other providers present at time of visit.   Diet Order:   Diet Order             Diet general           Diet regular Room service appropriate? Yes; Fluid consistency: Thin  Diet effective now                   EDUCATION NEEDS:   No education needs have been identified at this time  Skin:  Skin Assessment: Reviewed RN Assessment (closed L hip incision)  Last BM:  10/28  Height:   Ht Readings from Last 1 Encounters:   09/26/23 5\' 2"  (1.575 m)    Weight:   Wt Readings from Last 1 Encounters:  09/26/23 44.1 kg   BMI:  Body mass index is 17.78 kg/m.  Estimated Nutritional Needs:   Kcal:  1200-1400  Protein:  60-75g  Fluid:  1.2-1.4L Drusilla Kanner, RDN, LDN Clinical Nutrition

## 2023-09-30 NOTE — Progress Notes (Signed)
PROGRESS NOTE  Holly [name] NWG:[id number] DOB: 1928-06-19   PCP: Ardith Dark, MD  Patient is from: Home  DOA: 09/25/2023 LOS: 5  Chief complaints Chief Complaint  Patient presents with   Leg Injury   Fall     Brief Narrative / Interim history: 89 y.o. F with PMH of COPD, seborrheic keratosis, hypothyroidism, anxiety and hyperlipidemia admitted for left femoral intertrochanteric and left humeral epicondylar fractures that she sustained a fall at home.    Patient underwent cephalomedullary nailing of left intertrochanteric femoral fracture by Dr. Jena Gauss on 10/28.  Ortho recommended nonoperative management for left humeral fracture.    Hospital course complicated by postoperative ABLA and AKI.  She received a total of 4 units so far.  Eventually, H&H stable.  AKI resolved with IV fluid.  Passed voiding trial on 10/31.  Therapy recommended SNF.  Medically stable for discharge.  Subjective: Seen and examined earlier this morning.  Patient had 19-20 runs of NSVT overnight.  Not symptomatic from this.  Has no complaint this morning but wants to rest today.  Pain fairly controlled.  Hemoglobin improved to 8.2 and remained stable after transfusion.  Denies problem with voiding after removal of Foley yesterday.  Patient's son and daughter at bedside.  Also talked to patient's granddaughter, Lawanna Kobus over the phone.   Objective: Vitals:   09/30/23 0800 09/30/23 1234 09/30/23 1332 09/30/23 1334  BP: (!) 153/73 (!) 153/73 (!) 146/78 (!) 146/78  Pulse:   78   Resp: 19 20  20   Temp:  98.2 F (36.8 C)    TempSrc:  Oral    SpO2: 94% 98%  97%  Weight:      Height:        Examination:  GENERAL: Very frail.  Nontoxic. HEENT: MMM.  Vision and hearing grossly intact.  NECK: Supple.  No apparent JVD.  RESP:  No IWOB.  Fair aeration bilaterally. CVS:  RRR. Heart sounds normal.  ABD/GI/GU: BS+. Abd soft, NTND.  Foley catheter in place. MSK/EXT:  Moves extremities but limited by pain on the  left.  1+ BLE edema SKIN: Surgical wound DCI. NEURO: Awake, alert and oriented appropriately.  No apparent focal neuro deficit. PSYCH: Calm. Normal affect.   Procedures:  10/28-cephalomedullary nailing of left intertrochanteric fracture by Dr. Jena Gauss  Microbiology summarized: None  Assessment and plan: Fall at home-likely mechanical. Left femoral intertrochanteric fracture due to fall at home Left humeral epicondylar fracture due to fall at home -S/p cephalomedullary nailing of left intertrochanteric fracture by Dr. Jena Gauss on 10/28 -Nonoperative management for left humeral fracture -Scheduled Tylenol with as needed oxycodone and morphine for pain control -Subcu Lovenox discontinued due to ABLA.  Currently on ASA 81 mg daily.  Will increase to twice daily if H&H stable -Bowel regimen -PT/OT-recommended SNF  Acute blood loss anemia: No obvious bleeding.  No hematoma or ecchymosis at surgical site.  No report of GI bleed.  Received 4 units so far.  H&H seems to be stable now.  Recent Labs    09/26/23 2105 09/27/23 0416 09/27/23 0821 09/27/23 0950 09/27/23 2039 09/28/23 0550 09/29/23 0320 09/29/23 0900 09/29/23 1652 09/30/23 0435  HGB 8.0* 7.6* 7.0* 7.2* 8.2* 8.4* 7.1* 7.3* 8.3* 8.2*  -Monitor CBC daily     AKI on CKD-3A: Likely dehydration, hypotension and poor p.o. intake.  Resolved. Recent Labs    04/15/23 1400 09/25/23 1630 09/26/23 1027 09/26/23 1405 09/27/23 0416 09/28/23 0550 09/29/23 0320 09/30/23 0435  BUN 23 25* 23 21  25* 32* 30* 29*  CREATININE 1.06* 1.03* 1.00 1.08* 1.36* 1.45* 1.05* 1.00  -Monitor intermittently   Hypotension: Resolved.  Now with elevated BP. -Start low-dose metoprolol given NSVT  NSVT: Had 19-20 runs of NSVT overnight. -Started low-dose metoprolol -Optimize electrolytes  Thrombocytopenia: Stable. Platelet count was 169,000 on 10/27.  Unlikely HIT.  Resolving. -Continue monitoring  History of stroke -Low-dose aspirin as  above   History of COPD: Stable -Continue inhalers   Generalized anxiety disorder: Seems to be on Ativan at home.  Did not require here -Monitor   Leukocytosis: Likely demargination.  Resolved without antibiotics.   Hyperkalemia: Resolved.   Hypothyroidism -Continue levothyroxine.  Advance care planning: Full code except chest compression.  See discussion from 10/30  Underweight Body mass index is 17.78 kg/m. -Dietitian consulted          DVT prophylaxis:  SCDs Start: 09/26/23 1310 SCDs Start: 09/25/23 2036  Code Status: Full code except chest compression.  However, Lawanna Kobus stated that she will be full code for transport to SNF Family Communication: Updated patient's son and daughter at bedside and granddaughter, Marylene Land over the phone Level of care: Telemetry Medical Status is: Inpatient Remains inpatient appropriate because: Safe disposition, SNF   Final disposition: SNF Consultants:  Orthopedic surgery  55 minutes with more than 50% spent in reviewing records, counseling patient/family and coordinating care.   Sch Meds:  Scheduled Meds:  sodium chloride   Intravenous Once   acetaminophen  650 mg Oral Q6H WA   aspirin EC  81 mg Oral Daily   atorvastatin  40 mg Oral Daily   Chlorhexidine Gluconate Cloth  6 each Topical Daily   cholecalciferol  2,000 Units Oral Daily   ferrous sulfate  325 mg Oral Q breakfast   levothyroxine  112 mcg Oral Q0600   metoprolol tartrate  12.5 mg Oral BID   mometasone-formoterol  2 puff Inhalation BID   mupirocin ointment  1 Application Nasal BID   senna-docusate  1 tablet Oral Daily   sodium chloride flush  10-40 mL Intracatheter Q12H   Continuous Infusions: PRN Meds:.bisacodyl, dextrose, ipratropium-albuterol, morphine injection, ondansetron **OR** ondansetron (ZOFRAN) IV, oxyCODONE, [COMPLETED] polyethylene glycol **FOLLOWED BY** polyethylene glycol, sodium chloride flush  Antimicrobials: Anti-infectives (From admission,  onward)    Start     Dose/Rate Route Frequency Ordered Stop   09/26/23 2000  ceFAZolin (ANCEF) IVPB 2g/100 mL premix        2 g 200 mL/hr over 30 Minutes Intravenous Every 12 hours 09/26/23 1309 09/27/23 1040   09/26/23 1500  ceFAZolin (ANCEF) IVPB 2g/100 mL premix        2 g 200 mL/hr over 30 Minutes Intravenous On call to O.R. 09/26/23 0044 09/26/23 1152        I have personally reviewed the following labs and images: CBC: Recent Labs  Lab 09/27/23 0950 09/27/23 2039 09/28/23 0550 09/29/23 0320 09/29/23 0900 09/29/23 1652 09/30/23 0435  WBC 9.2  --  12.1* 8.1 8.2  --  7.1  NEUTROABS 6.6  --   --   --   --   --   --   HGB 7.2*   < > 8.4* 7.1* 7.3* 8.3* 8.2*  HCT 21.8*   < > 26.2* 21.3* 22.3* 25.5* 25.2*  MCV 93.2  --  90.3 89.9 91.0  --  89.0  PLT 83*  --  88* 101* 116*  --  142*   < > = values in this interval not displayed.   BMP &  GFR Recent Labs  Lab 09/26/23 1405 09/27/23 0416 09/28/23 0550 09/29/23 0320 09/30/23 0435  NA 134* 135 133* 136 134*  K 4.2 4.8 4.8 3.7 3.6  CL 109 107 105 108 103  CO2 20* 22 23 22 22   GLUCOSE 120* 129* 125* 96 103*  BUN 21 25* 32* 30* 29*  CREATININE 1.08* 1.36* 1.45* 1.05* 1.00  CALCIUM 7.5* 7.9* 7.7* 7.8* 7.5*  MG  --   --   --  1.9 1.8  PHOS  --   --   --  2.4* 2.6   Estimated Creatinine Clearance: 24.5 mL/min (by C-G formula based on SCr of 1 mg/dL). Liver & Pancreas: Recent Labs  Lab 09/25/23 2038 09/26/23 1405 09/28/23 0550 09/29/23 0320 09/30/23 0435  AST 29 24 19   --   --   ALT 15 18 6   --   --   ALKPHOS 59 44 45  --   --   BILITOT 1.1 0.9 0.9  --   --   PROT 6.2* 4.8* 4.7*  --   --   ALBUMIN 3.4* 2.6* 2.4* 2.0* 1.9*   No results for input(s): "LIPASE", "AMYLASE" in the last 168 hours. No results for input(s): "AMMONIA" in the last 168 hours. Diabetic: No results for input(s): "HGBA1C" in the last 72 hours. Recent Labs  Lab 09/29/23 0604 09/29/23 1207 09/29/23 1805 09/29/23 2320 09/30/23 0549   GLUCAP 98 126* 99 108* 90   Cardiac Enzymes: Recent Labs  Lab 09/25/23 2038  CKTOTAL 88   No results for input(s): "PROBNP" in the last 8760 hours. Coagulation Profile: No results for input(s): "INR", "PROTIME" in the last 168 hours. Thyroid Function Tests: No results for input(s): "TSH", "T4TOTAL", "FREET4", "T3FREE", "THYROIDAB" in the last 72 hours. Lipid Profile: No results for input(s): "CHOL", "HDL", "LDLCALC", "TRIG", "CHOLHDL", "LDLDIRECT" in the last 72 hours. Anemia Panel: No results for input(s): "VITAMINB12", "FOLATE", "FERRITIN", "TIBC", "IRON", "RETICCTPCT" in the last 72 hours.  Urine analysis:    Component Value Date/Time   COLORURINE YELLOW 04/16/2022 1517   APPEARANCEUR CLEAR 04/16/2022 1517   LABSPEC 1.010 04/16/2022 1517   PHURINE 6.0 04/16/2022 1517   GLUCOSEU NEGATIVE 04/16/2022 1517   HGBUR NEGATIVE 04/16/2022 1517   HGBUR trace-lysed 12/15/2007 0858   BILIRUBINUR NEGATIVE 04/16/2022 1517   BILIRUBINUR negative 06/05/2020 1413   KETONESUR NEGATIVE 04/16/2022 1517   PROTEINUR NEGATIVE 04/16/2022 1517   UROBILINOGEN 1.0 06/05/2020 1413   UROBILINOGEN 1.0 08/27/2011 1257   NITRITE NEGATIVE 04/16/2022 1517   LEUKOCYTESUR TRACE (A) 04/16/2022 1517   Sepsis Labs: Invalid input(s): "PROCALCITONIN", "LACTICIDVEN"  Microbiology: Recent Results (from the past 240 hour(s))  Surgical pcr screen     Status: Abnormal   Collection Time: 09/26/23  2:35 AM   Specimen: Nasal Mucosa; Nasal Swab  Result Value Ref Range Status   MRSA, PCR NEGATIVE NEGATIVE Final   Staphylococcus aureus POSITIVE (A) NEGATIVE Final    Comment: (NOTE) The Xpert SA Assay (FDA approved for NASAL specimens in patients 106 years of age and older), is one component of a comprehensive surveillance program. It is not intended to diagnose infection nor to guide or monitor treatment. Performed at [facility], [address], [place], [place] [address]     Radiology Studies: No  results found.    Tyrees Chopin T. Birgitta Uhlir [facility]  If 7PM-7AM, please contact night-coverage [email or web address] 09/30/2023, 2:46 PM

## 2023-09-30 NOTE — Progress Notes (Signed)
Physical Therapy Treatment Patient Details Name: Holly [name] MRN: [id number] DOB: [birth year] Today's Date: 09/30/2023   History of Present Illness Pt presenting 10/27 after fall at home resulting in L hip, head, and arm pain. CT L elbow with Acute, mildly comminuted, intra-articular fracture deformity involving the lateral humeral epicondyle. L hip xray with comminuted intertrochanteric L hip fx. Now s/p cephalomedullary nailing of L intertrochanteric femur fx 10/28. PMH significant for COPD, seborrheic keratosis, hypothyroidism, anxiety, HLD.    PT Comments  Pt in bed upon arrival and agreeable to PT session. Worked on transfers, balance, and LE strength in today's session. Pt required multiple cues throughout the session to adhere to L UE WB precautions and was able to follow single step commands. With Stedy, pt was able to stand with MinAx2 while using R UE to pull self forward. Pt was able to tolerate weight shifting, however, pt was unable to lift LE's off the ground. Pt had episode of incontinence requiring staff assist with pericare. Pt then stood from recliner with 1HH support and ModAx2 to rise and maintain balance. Pt had difficulty standing fully upright and slowly lowered to the recliner. Pt is progressing slowly towards goals. Current d/c plans remain appropriate. Acute PT to follow.      If plan is discharge home, recommend the following: A lot of help with walking and/or transfers;A lot of help with bathing/dressing/bathroom;Assist for transportation;Help with stairs or ramp for entrance   Can travel by private vehicle     No  Equipment Recommendations   (TBD at next level of care)       Precautions / Restrictions Precautions Precautions: Fall Restrictions Weight Bearing Restrictions: Yes LUE Weight Bearing: Non weight bearing LLE Weight Bearing: Weight bearing as tolerated Other Position/Activity Restrictions: can WB through LUE with platform walker     Mobility  Bed  Mobility Overal bed mobility: Needs Assistance Bed Mobility: Supine to Sit     Supine to sit: Max assist, +2 for physical assistance, HOB elevated     General bed mobility comments: MaxAx2 for LE management and trunk navigation. Pt assisted minimally with moving R LE towards EOB. Cues to push with R UE and to adhere to L UE WB precautions    Transfers Overall transfer level: Needs assistance Equipment used: 2 person hand held assist, Ambulation equipment used Transfers: Sit to/from Stand, Bed to chair/wheelchair/BSC Sit to Stand: +2 physical assistance, Min assist, Via lift equipment           General transfer comment: With Stedy, pt was MinAx2 pulling with R UE and assist for rise up and steadying in standing. Multiple STS from elevated surfaced with MinA. Able to weight shift in [place] with ModA support, however, unable to lift legs. From recliner, 2 STS w/ 1 HH support and ModAx2. Difficulty standing fully upright. Transfer via Lift Equipment: Stedy  Ambulation/Gait  General Gait Details: nt, pt unable to lift LEs       Balance Overall balance assessment: Needs assistance Sitting-balance support: No upper extremity supported, Feet supported Sitting balance-Leahy Scale: Fair Sitting balance - Comments: no assistance needing with sitting statically, limited ability to lean   Standing balance support: Bilateral upper extremity supported, During functional activity, Reliant on assistive device for balance Standing balance-Leahy Scale: Poor Standing balance comment: reliant on UE support, ModAx2 to stand with 1HH support         Cognition Arousal: Alert Behavior During Therapy: WFL for tasks assessed/performed Overall Cognitive Status: Impaired/Different from baseline  Area of Impairment: Memory, Following commands, Safety/judgement    Memory: Decreased recall of precautions, Decreased short-term memory Following Commands: Follows one step commands consistently, Follows  one step commands with increased time Safety/Judgement: Decreased awareness of safety, Decreased awareness of deficits     General Comments: requires repeated cues for L UE WB precautions, single step commands with increased time to process        Exercises General Exercises - Lower Extremity Ankle Circles/Pumps: AROM, Both, 10 reps, Supine Long Arc Quad: AROM, AAROM, Both, 10 reps (AAROM with L LE, AROM with R LE) Other Exercises Other Exercises: 3 STS from elevated Stedy, MinA    General Comments General comments (skin integrity, edema, etc.): VSS on RA      Pertinent Vitals/Pain Pain Assessment Pain Assessment: Faces Faces Pain Scale: Hurts even more Pain Location: L elbow, LLE with activity Pain Descriptors / Indicators: Aching, Discomfort, Grimacing Pain Intervention(s): Limited activity within patient's tolerance, Monitored during session, Repositioned     PT Goals (current goals can now be found in the care plan section) Acute Rehab PT Goals Patient Stated Goal: to get better PT Goal Formulation: With patient Time For Goal Achievement: 10/12/23 Potential to Achieve Goals: Good Progress towards PT goals: Progressing toward goals    Frequency    Min 1X/week          AM-PAC PT "6 Clicks" Mobility   Outcome Measure  Help needed turning from your back to your side while in a flat bed without using bedrails?: A Lot Help needed moving from lying on your back to sitting on the side of a flat bed without using bedrails?: Total Help needed moving to and from a bed to a chair (including a wheelchair)?: Total Help needed standing up from a chair using your arms (e.g., wheelchair or bedside chair)?: Total Help needed to walk in hospital room?: Total Help needed climbing 3-5 steps with a railing? : Total 6 Click Score: 7    End of Session Equipment Utilized During Treatment: Gait belt Activity Tolerance: Patient limited by pain Patient left: in bed;with call  bell/phone within reach;with bed alarm set;with family/visitor present Nurse Communication: Mobility status PT Visit Diagnosis: Unsteadiness on feet (R26.81);Repeated falls (R29.6);Muscle weakness (generalized) (M62.81)     Time: 1125-1204 PT Time Calculation (min) (ACUTE ONLY): 39 min  Charges:    $Therapeutic Exercise: 8-22 mins $Therapeutic Activity: 8-22 mins $Neuromuscular Re-education: 8-22 mins PT General Charges $$ ACUTE PT VISIT: 1 Visit                     Hilton Cork, PT, DPT Secure Chat Preferred  Rehab Office [phone]    Arturo Morton Brion [employer] 09/30/2023, 1:10 PM

## 2023-09-30 NOTE — Progress Notes (Signed)
Notified by CCMD that patient had a 19-20 beat run nonsustained SVT. Patient asymptomatic and sleeping.  Will continue to monitor

## 2023-09-30 NOTE — Discharge Instructions (Addendum)
Orthopaedic Trauma Service Discharge Instructions   General Discharge Instructions  WEIGHT BEARING STATUS:Weightbearing as tolerated left lower extremity  RANGE OF MOTION/ACTIVITY: ok for hip and knee range of motion as tolerated  Wound Care: Incisions can be left open to air if there is no drainage. Once the incision is completely dry and without drainage, it may be left open to air out.  Showering may begin as early as post op day 3.  Clean incision gently with soap and water.  DVT/PE prophylaxis:  Aspirin 81 mg daily x 30 days  Diet: as you were eating previously.  Can use over the counter stool softeners and bowel preparations, such as Miralax, to help with bowel movements.  Narcotics can be constipating.  Be sure to drink plenty of fluids  PAIN MEDICATION USE AND EXPECTATIONS  You have likely been given narcotic medications to help control your pain.  After a traumatic event that results in an fracture (broken bone) with or without surgery, it is ok to use narcotic pain medications to help control one's pain.  We understand that everyone responds to pain differently and each individual patient will be evaluated on a regular basis for the continued need for narcotic medications. Ideally, narcotic medication use should last no more than 6-8 weeks (coinciding with fracture healing).   As a patient it is your responsibility as well to monitor narcotic medication use and report the amount and frequency you use these medications when you come to your office visit.   We would also advise that if you are using narcotic medications, you should take a dose prior to therapy to maximize you participation.  IF YOU ARE ON NARCOTIC MEDICATIONS IT IS NOT PERMISSIBLE TO OPERATE A MOTOR VEHICLE (MOTORCYCLE/CAR/TRUCK/MOPED) OR HEAVY MACHINERY DO NOT MIX NARCOTICS WITH OTHER CNS (CENTRAL NERVOUS SYSTEM) DEPRESSANTS SUCH AS ALCOHOL   STOP SMOKING OR USING NICOTINE PRODUCTS!!!!  As discussed nicotine  severely impairs your body's ability to heal surgical and traumatic wounds but also impairs bone healing.  Wounds and bone heal by forming microscopic blood vessels (angiogenesis) and nicotine is a vasoconstrictor (essentially, shrinks blood vessels).  Therefore, if vasoconstriction occurs to these microscopic blood vessels they essentially disappear and are unable to deliver necessary nutrients to the healing tissue.  This is one modifiable factor that you can do to dramatically increase your chances of healing your injury.    (This means no smoking, no nicotine gum, patches, etc)  DO NOT USE NONSTEROIDAL ANTI-INFLAMMATORY DRUGS (NSAID'S)  Using products such as Advil (ibuprofen), Aleve (naproxen), Motrin (ibuprofen) for additional pain control during fracture healing can delay and/or prevent the healing response.  If you would like to take over the counter (OTC) medication, Tylenol (acetaminophen) is ok.  However, some narcotic medications that are given for pain control contain acetaminophen as well. Therefore, you should not exceed more than 4000 mg of tylenol in a day if you do not have liver disease.  Also note that there are may OTC medicines, such as cold medicines and allergy medicines that my contain tylenol as well.  If you have any questions about medications and/or interactions please ask your doctor/PA or your pharmacist.      ICE AND ELEVATE INJURED/OPERATIVE EXTREMITY  Using ice and elevating the injured extremity above your heart can help with swelling and pain control.  Icing in a pulsatile fashion, such as 20 minutes on and 20 minutes off, can be followed.    Do not place ice directly on  skin. Make sure there is a barrier between to skin and the ice pack.    Using frozen items such as frozen peas works well as the conform nicely to the are that needs to be iced.  USE AN ACE WRAP OR TED HOSE FOR SWELLING CONTROL  In addition to icing and elevation, Ace wraps or TED hose are used to  help limit and resolve swelling.  It is recommended to use Ace wraps or TED hose until you are informed to stop.    When using Ace Wraps start the wrapping distally (farthest away from the body) and wrap proximally (closer to the body)   Example: If you had surgery on your leg or thing and you do not have a splint on, start the ace wrap at the toes and work your way up to the thigh        If you had surgery on your upper extremity and do not have a splint on, start the ace wrap at your fingers and work your way up to the upper arm  CALL THE OFFICE WITH ANY QUESTIONS OR CONCERNS: [phone]   VISIT OUR WEBSITE FOR ADDITIONAL INFORMATION: [email or web address]   Discharge Wound Care Instructions  Do NOT apply any ointments, solutions or lotions to pin sites or surgical wounds.  These prevent needed drainage and even though solutions like hydrogen peroxide kill bacteria, they also damage cells lining the pin sites that help fight infection.  Applying lotions or ointments can keep the wounds moist and can cause them to breakdown and open up as well. This can increase the risk for infection. When in doubt call the office.  If any drainage is noted, use foam dressings - These dressing supplies should be available at local medical supply stores [facility], [facility], etc) as well as Insurance claims handler ([facility], [facility], [employer], etc)  Once the incision is completely dry and without drainage, it may be left open to air out.  Showering may begin 36-48 hours later.  Cleaning gently with soap and water.

## 2023-09-30 NOTE — Progress Notes (Addendum)
Mobility Specialist Progress Note:   09/30/23 1412  Mobility  Activity Transferred from chair to bed  Level of Assistance Moderate assist, patient does 50-74% (+2)  Assistive Device Stedy  LUE Weight Bearing NWB  LLE Weight Bearing WBAT  Activity Response Tolerated well  $Mobility charge 1 Mobility  Mobility Specialist Start Time (ACUTE ONLY) 1350  Mobility Specialist Stop Time (ACUTE ONLY) 1405  Mobility Specialist Time Calculation (min) (ACUTE ONLY) 15 min   Pt received in chair, requesting to return back to bed. ModA +2 to stand in stedy. Once standing pt found disconnected from purewick and urinated on floor.  NT present for clean up. Pt able to follow commands to keep WB precautions with LUE. Pt denied any discomfort during session. MaxA +2 for bed mobility. Pt left in supine position with NT still present in room.  Leory Plowman  Mobility Specialist Please contact via [employer] office at [phone]

## 2023-10-01 DIAGNOSIS — S72002A Fracture of unspecified part of neck of left femur, initial encounter for closed fracture: Secondary | ICD-10-CM | POA: Diagnosis not present

## 2023-10-01 DIAGNOSIS — Z8673 Personal history of transient ischemic attack (TIA), and cerebral infarction without residual deficits: Secondary | ICD-10-CM | POA: Diagnosis not present

## 2023-10-01 DIAGNOSIS — W19XXXA Unspecified fall, initial encounter: Secondary | ICD-10-CM | POA: Diagnosis not present

## 2023-10-01 DIAGNOSIS — I10 Essential (primary) hypertension: Secondary | ICD-10-CM | POA: Diagnosis not present

## 2023-10-01 LAB — CBC
HCT: 26.3 % — ABNORMAL LOW (ref 36.0–46.0)
Hemoglobin: 8.6 g/dL — ABNORMAL LOW (ref 12.0–15.0)
MCH: 29.6 pg (ref 26.0–34.0)
MCHC: 32.7 g/dL (ref 30.0–36.0)
MCV: 90.4 fL (ref 80.0–100.0)
Platelets: 187 10*3/uL (ref 150–400)
RBC: 2.91 MIL/uL — ABNORMAL LOW (ref 3.87–5.11)
RDW: 18.2 % — ABNORMAL HIGH (ref 11.5–15.5)
WBC: 7.7 10*3/uL (ref 4.0–10.5)
nRBC: 0 % (ref 0.0–0.2)

## 2023-10-01 LAB — BASIC METABOLIC PANEL
Anion gap: 6 (ref 5–15)
BUN: 27 mg/dL — ABNORMAL HIGH (ref 8–23)
CO2: 22 mmol/L (ref 22–32)
Calcium: 7.7 mg/dL — ABNORMAL LOW (ref 8.9–10.3)
Chloride: 105 mmol/L (ref 98–111)
Creatinine, Ser: 0.92 mg/dL (ref 0.44–1.00)
GFR, Estimated: 58 mL/min — ABNORMAL LOW (ref >60)
Glucose, Bld: 95 mg/dL (ref 70–99)
Potassium: 3.8 mmol/L (ref 3.5–5.1)
Sodium: 133 mmol/L — ABNORMAL LOW (ref 135–145)

## 2023-10-01 LAB — GLUCOSE, CAPILLARY: Glucose-Capillary: 78 mg/dL (ref 70–99)

## 2023-10-01 LAB — MAGNESIUM: Magnesium: 2.2 mg/dL (ref 1.7–2.4)

## 2023-10-01 MED ORDER — ASPIRIN EC 81 MG PO TBEC: DELAYED_RELEASE_TABLET | ORAL | Status: DC

## 2023-10-01 MED ORDER — METOPROLOL TARTRATE 25 MG PO TABS: 12.5000 mg | ORAL_TABLET | Freq: Two times a day (BID) | ORAL | 1 refills | Status: DC

## 2023-10-01 NOTE — TOC Progression Note (Addendum)
Transition of Care [facility]) - Progression Note    Patient Details  Name: Holly [name] MRN: [id number] Date of Birth: Aug 10, 1928  [facility] CM/SW Contact  Ronny Bacon, RN Phone Number: 10/01/2023, 10:06 AM  Clinical Narrative:   Per floor nurse, family is declining SNF placement and wants to take patient home. CSW made aware, awaiting PT/OT eval for recommendations.  1054 Cheryl with [facility] accepted patient for [facility] PT/OT. Family reports patient has all necessary DME at home.  1127: LUE platform ordered through [facility] with [employer]. Per floor nurse patient needs PTAR to get home. DME to be delivered to patient home on next available delivery day.   1138: PTAR arranged.    Expected Discharge Plan: Skilled Nursing Facility    Expected Discharge Plan and Services In-house Referral: Clinical Social Work     Living arrangements for the past 2 months: Single Family Home Expected Discharge Date: 10/01/23                                     Social Determinants of Health (SDOH) Interventions SDOH Screenings   Food Insecurity: No Food Insecurity (08/15/2023)  Housing: Low Risk  (08/15/2023)  Transportation Needs: No Transportation Needs (08/15/2023)  Utilities: Not At Risk (08/15/2023)  Alcohol Screen: Low Risk  (08/09/2022)  Depression (PHQ2-9): Low Risk  (08/15/2023)  Recent Concern: Depression (PHQ2-9) - High Risk (07/20/2023)  Financial Resource Strain: Low Risk  (08/15/2023)  Physical Activity: Insufficiently Active (08/15/2023)  Social Connections: Moderately Integrated (08/15/2023)  Stress: No Stress Concern Present (08/15/2023)  Tobacco Use: Medium Risk (09/26/2023)  Health Literacy: Inadequate Health Literacy (08/15/2023)    Readmission Risk Interventions     No data to display

## 2023-10-01 NOTE — Discharge Summary (Signed)
Physician Discharge Summary  Holly [name] YWV:[id number] DOB: [birth year] DOA: 09/25/2023  PCP: Ardith Dark, MD  Admit date: 09/25/2023 Discharge date: 10/01/2023 Admitted From: Home Disposition: Home Recommendations for Outpatient Follow-up:  Outpatient follow-up with PCP and orthopedic surgery as below Check CMP and CBC at follow-up Please follow up on the following pending results: None  Home Health: [facility] PT/OT Equipment/Devices: Patient has wheelchair and hospital bed.  Ordered BSC and RW platform   Discharge Condition: Stable CODE STATUS: Full code except chest compression  Follow-up Information     Haddix, Gillie Manners, MD. Schedule an appointment as soon as possible for a visit in 2 week(s).   Specialty: Orthopedic Surgery Why: For wound re-check Contact information: [address] [place] [address] [phone]         Ardith Dark, MD. Schedule an appointment as soon as possible for a visit in 2 week(s).   Specialty: Family Medicine Contact information: [address] [place] [address] [phone]         [facility] Follow up.   Why: Physical and Occupational therapy. Office will call to arrange follow up after discharge. Contact information: [address] [place] [address] [phone]                 Hospital course 89 y.o. F with PMH of COPD, seborrheic keratosis, hypothyroidism, anxiety and hyperlipidemia admitted for left femoral intertrochanteric and left humeral epicondylar fractures that she sustained a fall at home.     Patient underwent cephalomedullary nailing of left intertrochanteric femoral fracture by Dr. Jena Gauss on 10/28.  Ortho recommended nonoperative management for left humeral fracture.     Hospital course complicated by postoperative ABLA and AKI.  She received a total of 4 units so far.  Eventually, H&H remained stable.  AKI resolved with IV fluid.  Passed voiding trial on 10/31.  Therapy  recommended SNF but patient and family elected to go home with home health and DME.  See individual problem list below for more.   Problems addressed during this hospitalization Fall at home-likely mechanical. Left femoral intertrochanteric fracture due to fall at home Left humeral epicondylar fracture due to fall at home -S/p cephalomedullary nailing of left intertrochanteric fracture by Dr. Jena Gauss on 10/28 -Nonoperative management for left humeral fracture -Tylenol and oxycodone for pain control per surgery -Aspirin 81 mg twice daily for VTE prophylaxis -Bowel regimen -HH PT/OT/BSC and RW platform.  Patient has wheelchair and hospital bed.   Acute blood loss anemia: No obvious bleeding.  No hematoma or ecchymosis at surgical site.  No report of GI bleed.  Received 4 units so far.  H&H seems to be stable now.  Recent Labs    09/27/23 0416 09/27/23 0821 09/27/23 0950 09/27/23 2039 09/28/23 0550 09/29/23 0320 09/29/23 0900 09/29/23 1652 09/30/23 0435 10/01/23 0350  HGB 7.6* 7.0* 7.2* 8.2* 8.4* 7.1* 7.3* 8.3* 8.2* 8.6*  -Check CBC at follow-up  AKI on CKD-3A: Baseline Cr ~1.0.  Likely dehydration, hypotension and poor p.o. intake.  Resolved. Recent Labs    04/15/23 1400 09/25/23 1630 09/26/23 1027 09/26/23 1405 09/27/23 0416 09/28/23 0550 09/29/23 0320 09/30/23 0435 10/01/23 0350  BUN 23 25* 23 21 25* 32* 30* 29* 27*  CREATININE 1.06* 1.03* 1.00 1.08* 1.36* 1.45* 1.05* 1.00 0.92  -Recheck at follow-up   Hypotension: Resolved.  Now slightly hypertensive -Started on low-dose metoprolol given episode of NSVT   NSVT: Had 19-20 runs of NSVT the night  of 11/1.  Was not symptomatic. -Low-dose metoprolol as above   Thrombocytopenia: Likely reactive.  Resolved.   History of stroke -Low-dose aspirin as above   History of COPD: Stable -Continue inhalers   Generalized anxiety disorder: Seems to be on Ativan at home.  Did not require here -Discontinued on discharge    Leukocytosis: Likely demargination.  Resolved without antibiotics.   Hyperkalemia: Resolved.   Hypothyroidism -Continue levothyroxine.  Hyponatremia: Mild and stable.   Advance care planning: Full code except chest compression.   Underweight/increased nutrient needs Nutrition Problem: Increased nutrient needs Etiology: hip fracture Signs/Symptoms: estimated needs Interventions: Ensure Enlive (each supplement provides 350kcal and 20 grams of protein), Magic cup     Time spent 40 minutes  Vital signs Vitals:   10/01/23 0334 10/01/23 0745 10/01/23 0751 10/01/23 1223  BP: (!) 152/70 (!) 161/67  (!) 158/85  Pulse:  70 68   Temp: 98.1 F (36.7 C) 98.7 F (37.1 C)  97.9 F (36.6 C)  Resp: 20 20  17   Height:      Weight:      SpO2: 95% 93% 95% 96%  TempSrc: Oral Oral  Oral  BMI (Calculated):         Discharge exam GENERAL: Very frail.  Nontoxic. HEENT: MMM.  Vision and hearing grossly intact.  NECK: Supple.  No apparent JVD.  RESP:  No IWOB.  Fair aeration bilaterally. CVS:  RRR. Heart sounds normal.  ABD/GI/GU: BS+. Abd soft, NTND.  Foley catheter in place. MSK/EXT:  Moves extremities but limited by pain on the left.  Trace BLE edema. SKIN: Surgical wound DCI. NEURO: Awake, alert and oriented appropriately.  No apparent focal neuro deficit. PSYCH: Calm. Normal affect.   Discharge Instructions Discharge Instructions     Diet general   Complete by: As directed    Discharge instructions   Complete by: As directed    It has been a pleasure taking care of you!  You were hospitalized due to left hip and left arm fracture from fall.  You have been treated surgically for left hip.  You have blood loss anemia for which you have been treated with blood transfusion.  Your hemoglobin remained stable after transfusion.  We have also started you on blood pressure medication.  Follow-up with your primary care doctor in 1 to 2 weeks or sooner if needed.  Follow-up with  orthopedic surgery per the recommendation.   Take care,   Increase activity slowly   Complete by: As directed       Allergies as of 10/01/2023       Reactions   Levofloxacin    Unknown reaction   Macrobid [nitrofurantoin Macrocrystal] Nausea Only   Sulfamethoxazole    Unknown reaction   Tramadol Other (See Comments)   Shakes   Amoxicillin Rash   Has patient had a PCN reaction causing immediate rash, facial/tongue/throat swelling, SOB or lightheadedness with hypotension: No Has patient had a PCN reaction causing severe rash involving mucus membranes or skin necrosis: No Has patient had a PCN reaction that required hospitalization: No Has patient had a PCN reaction occurring within the last 10 years: No If all of the above answers are "NO", then may proceed with Cephalosporin use. Made "bottom area" very raw        Medication List     STOP taking these medications    LORazepam 0.5 MG tablet Commonly known as: ATIVAN       TAKE these medications  acetaminophen 500 MG tablet Commonly known as: TYLENOL Take 500-1,000 mg by mouth See admin instructions. Take 1 tablet by mouth twice a day and 2 tablets at bedtime   albuterol 108 (90 Base) MCG/ACT inhaler Commonly known as: VENTOLIN HFA INHALE 1-2 PUFFS BY MOUTH EVERY 6 HOURS AS NEEDED FOR WHEEZE OR SHORTNESS OF BREATH   aspirin EC 81 MG tablet Take 1 tablet (81 mg total) by mouth in the morning and at bedtime for 30 days, THEN 1 tablet (81 mg total) daily. Swallow whole.. Start taking on: October 01, 2023 What changed: See the new instructions.   atorvastatin 40 MG tablet Commonly known as: LIPITOR Take 1 tablet (40 mg total) by mouth daily.   budesonide-formoterol 160-4.5 MCG/ACT inhaler Commonly known as: SYMBICORT Inhale 2 puffs into the lungs 2 (two) times daily.   ICAPS PO Take 1 capsule by mouth 2 (two) times daily.   levothyroxine 112 MCG tablet Commonly known as: SYNTHROID TAKE 1 TABLET BY MOUTH  EVERY DAY   metoprolol tartrate 25 MG tablet Commonly known as: LOPRESSOR Take 0.5 tablets (12.5 mg total) by mouth 2 (two) times daily.   oxyCODONE 5 MG immediate release tablet Commonly known as: Oxy IR/ROXICODONE Take 0.5-1 tablets (2.5-5 mg total) by mouth every 4 (four) hours as needed (2.5 mg for pain score 4-6, 5 mg for pain score 7-10).   senna-docusate 8.6-50 MG tablet Commonly known as: Senokot-S Take 1 tablet by mouth 2 (two) times daily between meals as needed for mild constipation.   vitamin D3 25 MCG tablet Commonly known as: CHOLECALCIFEROL Take 2 tablets (2,000 Units total) by mouth daily.               Durable Medical Equipment  (From admission, onward)           Start     Ordered   10/01/23 1058  For home use only DME Walker platform  Once       Question:  Patient needs a walker to treat with the following condition  Answer:  Femoral fracture (HCC)   10/01/23 1057   10/01/23 0914  For home use only DME Bedside commode  Once       Question:  Patient needs a bedside commode to treat with the following condition  Answer:  Closed left femoral fracture [facility])   10/01/23 0913            Consultations: Orthopedic surgery  Procedures/Studies: 10/28-cephalomedullary nailing of left intertrochanteric fracture by Dr. Jena Gauss    [place] EKG SITE RITE  Result Date: 09/28/2023 If Site Rite image not attached, placement could not be confirmed due to current cardiac rhythm.  [place] EKG SITE RITE  Result Date: 09/28/2023 If Site Rite image not attached, placement could not be confirmed due to current cardiac rhythm.  DG HIP PORT UNILAT W OR W/O PELVIS 1V LEFT  Result Date: 09/26/2023 CLINICAL DATA:  Fracture, postop. EXAM: DG HIP (WITH OR WITHOUT PELVIS) 1V PORT LEFT COMPARISON:  Preoperative imaging. FINDINGS: Femoral intramedullary nail with trans trochanteric and distal locking screw fixation traversing proximal femur fracture. Improved fracture alignment  from preoperative imaging. Recent postsurgical change includes air and edema in the soft tissues. IMPRESSION: ORIF of proximal femur fracture. Electronically Signed   By: Narda Rutherford M.D.   On: 09/26/2023 14:58   DG HIP UNILAT WITH PELVIS 2-3 VIEWS LEFT  Result Date: 09/26/2023 CLINICAL DATA:  Elective surgery. EXAM: DG HIP (WITH OR WITHOUT PELVIS) 2-3V LEFT COMPARISON:  Preoperative imaging FINDINGS: Six fluoroscopic spot views of the left hip obtained in the operating room. Imaging obtained during femoral intramedullary nail with trans trochanteric and distal locking screw fixation of proximal femur fracture. Fluoroscopy time 46 seconds. Dose 5.36 mGy. IMPRESSION: Intraoperative fluoroscopy during proximal femur fracture fixation. Electronically Signed   By: Narda Rutherford M.D.   On: 09/26/2023 12:45   DG C-Arm 1-60 Min-No Report  Result Date: 09/26/2023 Fluoroscopy was utilized by the requesting physician.  No radiographic interpretation.   CT ABDOMEN PELVIS WO CONTRAST  Result Date: 09/25/2023 CLINICAL DATA:  Abdominal trauma, blunt.  Hypotensive EXAM: CT ABDOMEN AND PELVIS WITHOUT CONTRAST TECHNIQUE: Multidetector CT imaging of the abdomen and pelvis was performed following the standard protocol without IV contrast. RADIATION DOSE REDUCTION: This exam was performed according to the departmental dose-optimization program which includes automated exposure control, adjustment of the mA and/or kV according to patient size and/or use of iterative reconstruction technique. COMPARISON:  None Available. FINDINGS: Lower chest: Bronchial wall thickening. Aortic valve leaflet and four-vessel coronary calcification. Enlarged heart. Cardiac changes suggestive of anemia. Hepatobiliary: Not enlarged. No focal lesion. The gallbladder is otherwise unremarkable with no radio-opaque gallstones. No biliary ductal dilatation. Pancreas: Normal pancreatic contour. No main pancreatic duct dilatation. Spleen: Not  enlarged. No focal lesion. Adrenals/Urinary Tract: No nodularity bilaterally. No hydroureteronephrosis. No nephroureterolithiasis. No contour deforming renal mass. The urinary bladder is unremarkable. Stomach/Bowel: Slightly limited evaluation due to motion artifact. Stomach is within normal limits. No evidence of bowel wall thickening or dilatation. Colonic diverticulosis. The appendix is not definitely identified with no inflammatory changes in the right lower quadrant to suggest acute appendicitis. Vasculature/Lymphatic: No abdominal aorta or iliac aneurysm. No abdominal, pelvic, inguinal lymphadenopathy. Reproductive: Normal. Other: No simple free fluid ascites. No pneumoperitoneum. No mesenteric hematoma identified. No organized fluid collection. Musculoskeletal: Right inguinal region femoral vessel coiling. No significant soft tissue hematoma. Acute comminuted and displaced left femoral intertrochanteric fracture. Acute left superior pubic rami minimally displaced fracture. No spinal fracture. Diffusely decreased bone density. Partially visualized acute comminuted and displaced left humeral supracondylar fracture. Ports and Devices: None. IMPRESSION: 1. Acute comminuted and displaced left femoral intertrochanteric fracture. 2. Acute left superior pubic rami minimally displaced fracture. 3. Partially visualized acute comminuted and displaced left humeral supracondylar fracture. Ports and Devices: None. Finding better evaluated on left elbow radiograph 09/25/2023. 4. No acute intra-abdominal or intrapelvic traumatic injury with mid evaluation on this noncontrast study with motion artifact. 5. No acute fracture or traumatic malalignment of the lumbar spine. Other imaging findings of potential clinical significance: 1. Colonic diverticulosis with no acute diverticulitis. 2. Aortic Atherosclerosis (ICD10-I70.0) including aortic valve leaflet and four-vessel coronary artery calcification. Electronically Signed   By:  Tish Frederickson M.D.   On: 09/25/2023 20:39   CT ELBOW LEFT WO CONTRAST  Result Date: 09/25/2023 CLINICAL DATA:  Left elbow fracture. EXAM: CT OF THE UPPER LEFT EXTREMITY WITHOUT CONTRAST TECHNIQUE: Multidetector CT imaging of the upper left extremity was performed according to the standard protocol. RADIATION DOSE REDUCTION: This exam was performed according to the departmental dose-optimization program which includes automated exposure control, adjustment of the mA and/or kV according to patient size and/or use of iterative reconstruction technique. COMPARISON:  Radiograph of the left elbow from earlier today. FINDINGS: Bones/Joint/Cartilage There is an acute, mildly comminuted, intra-articular fracture deformity involving the lateral humeral epicondyle. Slight impaction of the fracture fragments identified. Chronic, slightly impacted fracture deformity involving the lateral aspect of the radial head is again noted  in appears similar when compared with the plain film radiographs from 08/06/2022. Ligaments Suboptimally assessed by CT. Muscles and Tendons No mass identified. Soft tissues Small joint effusion. Soft tissue edema and hematoma identified overlying the lateral epicondyle of the distal humerus. IMPRESSION: 1. Acute, mildly comminuted, intra-articular fracture deformity involving the lateral humeral epicondyle. 2. Chronic, slightly impacted fracture deformity involving the lateral aspect of the radial head is again noted in appears similar when compared with the plain film radiographs from 08/06/2022. 3. Small joint effusion. 4. Soft tissue edema and hematoma identified overlying the lateral epicondyle of the distal humerus. Electronically Signed   By: Signa Kell M.D.   On: 09/25/2023 18:43   CT Head Wo Contrast  Result Date: 09/25/2023 CLINICAL DATA:  Fall, head and neck trauma EXAM: CT HEAD WITHOUT CONTRAST CT CERVICAL SPINE WITHOUT CONTRAST TECHNIQUE: Multidetector CT imaging of the head  and cervical spine was performed following the standard protocol without intravenous contrast. Multiplanar CT image reconstructions of the cervical spine were also generated. RADIATION DOSE REDUCTION: This exam was performed according to the departmental dose-optimization program which includes automated exposure control, adjustment of the mA and/or kV according to patient size and/or use of iterative reconstruction technique. COMPARISON:  MR brain, 04/16/2022 FINDINGS: CT HEAD FINDINGS Brain: No evidence of acute infarction, hemorrhage, hydrocephalus, extra-axial collection or mass lesion/mass effect. Periventricular and deep white matter hypodensity. Vascular: No hyperdense vessel or unexpected calcification. Skull: Normal. Negative for fracture or focal lesion. Sinuses/Orbits: No acute finding. Other: None. CT CERVICAL SPINE FINDINGS Alignment: Normal. Skull base and vertebrae: No acute fracture. No primary bone lesion or focal pathologic process. Soft tissues and spinal canal: No prevertebral fluid or swelling. No visible canal hematoma. Disc levels: Mild multilevel disc space height loss and osteophytosis throughout the cervical spine. Upper chest: Negative. Other: None. IMPRESSION: 1. No acute intracranial pathology. Small-vessel white matter disease. 2. No fracture or static subluxation of the cervical spine. 3. Mild multilevel cervical disc degenerative disease. Electronically Signed   By: Jearld Lesch M.D.   On: 09/25/2023 17:16   CT Cervical Spine Wo Contrast  Result Date: 09/25/2023 CLINICAL DATA:  Fall, head and neck trauma EXAM: CT HEAD WITHOUT CONTRAST CT CERVICAL SPINE WITHOUT CONTRAST TECHNIQUE: Multidetector CT imaging of the head and cervical spine was performed following the standard protocol without intravenous contrast. Multiplanar CT image reconstructions of the cervical spine were also generated. RADIATION DOSE REDUCTION: This exam was performed according to the departmental  dose-optimization program which includes automated exposure control, adjustment of the mA and/or kV according to patient size and/or use of iterative reconstruction technique. COMPARISON:  MR brain, 04/16/2022 FINDINGS: CT HEAD FINDINGS Brain: No evidence of acute infarction, hemorrhage, hydrocephalus, extra-axial collection or mass lesion/mass effect. Periventricular and deep white matter hypodensity. Vascular: No hyperdense vessel or unexpected calcification. Skull: Normal. Negative for fracture or focal lesion. Sinuses/Orbits: No acute finding. Other: None. CT CERVICAL SPINE FINDINGS Alignment: Normal. Skull base and vertebrae: No acute fracture. No primary bone lesion or focal pathologic process. Soft tissues and spinal canal: No prevertebral fluid or swelling. No visible canal hematoma. Disc levels: Mild multilevel disc space height loss and osteophytosis throughout the cervical spine. Upper chest: Negative. Other: None. IMPRESSION: 1. No acute intracranial pathology. Small-vessel white matter disease. 2. No fracture or static subluxation of the cervical spine. 3. Mild multilevel cervical disc degenerative disease. Electronically Signed   By: Jearld Lesch M.D.   On: 09/25/2023 17:16   DG HIP UNILAT WITH  PELVIS 2-3 VIEWS LEFT  Result Date: 09/25/2023 CLINICAL DATA:  Larey Seat, left hip pain EXAM: DG HIP (WITH OR WITHOUT PELVIS) 2-3V LEFT COMPARISON:  None Available. FINDINGS: Frontal view of the pelvis as well as frontal and frogleg lateral views of the left hip are obtained. There is a comminuted intertrochanteric left hip fracture, with impaction and varus angulation at the fracture site. No dislocation. Symmetrical bilateral hip osteoarthritis. The remainder of the bony pelvis is unremarkable. IMPRESSION: Upper 1 comminuted intertrochanteric left hip fracture, with impaction and varus angulation. Electronically Signed   By: Sharlet Salina M.D.   On: 09/25/2023 16:51   DG Chest Portable 1 View  Result  Date: 09/25/2023 CLINICAL DATA:  Larey Seat, left hip and arm pain EXAM: PORTABLE CHEST 1 VIEW COMPARISON:  02/05/2021 FINDINGS: Single frontal view of the chest demonstrates a stable cardiac silhouette. Continued ectasia and atherosclerosis of the thoracic aorta. No acute airspace disease, effusion, or pneumothorax. No acute bony abnormality. IMPRESSION: 1. No acute intrathoracic process. Electronically Signed   By: Sharlet Salina M.D.   On: 09/25/2023 16:51   DG Elbow Complete Left  Result Date: 09/25/2023 CLINICAL DATA:  Larey Seat downstairs, left arm pain EXAM: LEFT ELBOW - COMPLETE 3+ VIEW COMPARISON:  08/06/2022 FINDINGS: Frontal, bilateral oblique, and lateral views of the left elbow are obtained. Evaluation is limited due to suboptimal positioning. There is an acute intra-articular fracture involving the lateral humeral epicondyle, best seen on the oblique projections. Chronic healed radial head fracture is unchanged since prior exam. No other acute bony abnormalities. Alignment appears anatomic. Elevation of the anterior and posterior fat pads consistent with joint effusion. Soft tissue swelling of the elbow and proximal forearm. IMPRESSION: 1. Acute minimally displaced intra-articular lateral humeral epicondyle fracture, with associated joint effusion. 2. Chronic healed radial head fracture unchanged. 3. Soft tissue swelling. Electronically Signed   By: Sharlet Salina M.D.   On: 09/25/2023 16:50       The results of significant diagnostics from this hospitalization (including imaging, microbiology, ancillary and laboratory) are listed below for reference.     Microbiology: Recent Results (from the past 240 hour(s))  Surgical pcr screen     Status: Abnormal   Collection Time: 09/26/23  2:35 AM   Specimen: Nasal Mucosa; Nasal Swab  Result Value Ref Range Status   MRSA, PCR NEGATIVE NEGATIVE Final   Staphylococcus aureus POSITIVE (A) NEGATIVE Final    Comment: (NOTE) The Xpert SA Assay (FDA  approved for NASAL specimens in patients 63 years of age and older), is one component of a comprehensive surveillance program. It is not intended to diagnose infection nor to guide or monitor treatment. Performed at [facility], [address], [place], [place] [address]      Labs:  CBC: Recent Labs  Lab 09/27/23 [phone] 09/27/23 2039 09/28/23 0550 09/29/23 0320 09/29/23 0900 09/29/23 1652 09/30/23 0435 10/01/23 0350  WBC 9.2  --  12.1* 8.1 8.2  --  7.1 7.7  NEUTROABS 6.6  --   --   --   --   --   --   --   HGB 7.2*   < > 8.4* 7.1* 7.3* 8.3* 8.2* 8.6*  HCT 21.8*   < > 26.2* 21.3* 22.3* 25.5* 25.2* 26.3*  MCV 93.2  --  90.3 89.9 91.0  --  89.0 90.4  PLT 83*  --  88* 101* 116*  --  142* 187   < > = values in this interval not displayed.  BMP &GFR Recent Labs  Lab 09/27/23 0416 09/28/23 0550 09/29/23 0320 09/30/23 0435 10/01/23 0350  NA 135 133* 136 134* 133*  K 4.8 4.8 3.7 3.6 3.8  CL 107 105 108 103 105  CO2 22 23 22 22 22   GLUCOSE 129* 125* 96 103* 95  BUN 25* 32* 30* 29* 27*  CREATININE 1.36* 1.45* 1.05* 1.00 0.92  CALCIUM 7.9* 7.7* 7.8* 7.5* 7.7*  MG  --   --  1.9 1.8 2.2  PHOS  --   --  2.4* 2.6  --    Estimated Creatinine Clearance: 26.6 mL/min (by C-G formula based on SCr of 0.92 mg/dL). Liver & Pancreas: Recent Labs  Lab 09/25/23 2038 09/26/23 1405 09/28/23 0550 09/29/23 0320 09/30/23 0435  AST 29 24 19   --   --   ALT 15 18 6   --   --   ALKPHOS 59 44 45  --   --   BILITOT 1.1 0.9 0.9  --   --   PROT 6.2* 4.8* 4.7*  --   --   ALBUMIN 3.4* 2.6* 2.4* 2.0* 1.9*   No results for input(s): "LIPASE", "AMYLASE" in the last 168 hours. No results for input(s): "AMMONIA" in the last 168 hours. Diabetic: No results for input(s): "HGBA1C" in the last 72 hours. Recent Labs  Lab 09/29/23 2320 09/30/23 0549 09/30/23 1756 09/30/23 2345 10/01/23 0619  GLUCAP 108* 90 93 108* 78   Cardiac Enzymes: Recent Labs  Lab 09/25/23 2038  CKTOTAL 88    No results for input(s): "PROBNP" in the last 8760 hours. Coagulation Profile: No results for input(s): "INR", "PROTIME" in the last 168 hours. Thyroid Function Tests: No results for input(s): "TSH", "T4TOTAL", "FREET4", "T3FREE", "THYROIDAB" in the last 72 hours. Lipid Profile: No results for input(s): "CHOL", "HDL", "LDLCALC", "TRIG", "CHOLHDL", "LDLDIRECT" in the last 72 hours. Anemia Panel: No results for input(s): "VITAMINB12", "FOLATE", "FERRITIN", "TIBC", "IRON", "RETICCTPCT" in the last 72 hours. Urine analysis:    Component Value Date/Time   COLORURINE YELLOW 04/16/2022 1517   APPEARANCEUR CLEAR 04/16/2022 1517   LABSPEC 1.010 04/16/2022 1517   PHURINE 6.0 04/16/2022 1517   GLUCOSEU NEGATIVE 04/16/2022 1517   HGBUR NEGATIVE 04/16/2022 1517   HGBUR trace-lysed 12/15/2007 0858   BILIRUBINUR NEGATIVE 04/16/2022 1517   BILIRUBINUR negative 06/05/2020 1413   KETONESUR NEGATIVE 04/16/2022 1517   PROTEINUR NEGATIVE 04/16/2022 1517   UROBILINOGEN 1.0 06/05/2020 1413   UROBILINOGEN 1.0 08/27/2011 1257   NITRITE NEGATIVE 04/16/2022 1517   LEUKOCYTESUR TRACE (A) 04/16/2022 1517   Sepsis Labs: Invalid input(s): "PROCALCITONIN", "LACTICIDVEN"   SIGNED:  Almon Hercules, MD  [facility] 10/01/2023, 2:59 PM

## 2023-10-01 NOTE — Progress Notes (Signed)
Orthopedic Tech Progress Note Patient Details:  Holly [name] 10-Jul-1928 [id number]  Ortho Devices Type of Ortho Device: Arm sling Ortho Device/Splint Location: LUE Ortho Device/Splint Interventions: Application   Post Interventions Patient Tolerated: Well Instructions Provided: Care of device, Adjustment of device  [name] [name] 10/01/2023, 10:55 AM

## 2023-10-01 NOTE — Progress Notes (Signed)
Removed PICC line per order ( patient is D/C home today). Educated patient's daughter how to take care of previous PICC site dressing. Patient is staying on the bed for 30 min. HS [employer]

## 2023-10-01 NOTE — Progress Notes (Signed)
Discharge:   PICC line d/c'd via IV team.  DME was requested to be sent to the home so transportation wound not be delayed per POA. PT/OT visit and education this am.  Medications and d/c summary reviewed.  Transportation provided by [employer] per family request.

## 2023-10-01 NOTE — Progress Notes (Signed)
Occupational Therapy Treatment Patient Details Name: Holly [name] MRN: [id number] DOB: 08-May-1928 Today's Date: 10/01/2023   History of present illness Pt presenting 10/27 after fall at home resulting in L hip, head, and arm pain. CT L elbow with Acute, mildly comminuted, intra-articular fracture deformity involving the lateral humeral epicondyle. L hip xray with comminuted intertrochanteric L hip fx. Now s/p cephalomedullary nailing of L intertrochanteric femur fx 10/28. PMH significant for COPD, seborrheic keratosis, hypothyroidism, anxiety, HLD.   OT comments  Patient seen for family/patient education for transfer to prepare for discharge home instead of SNF. Patient demonstrated gains with bed mobility requiring max assist of one for supine to sitting on EOB with patient using RUE to pull up on rail. Face to face technique performed to transfer to recliner with family education provided. Patient asking to use [facility] and required mod assist +2 HHA to perform and max assist for hygiene while standing. Patient transferred back to bed with face to face technique with max assist due to fatigue. Family states they have hospital bed and wheelchair and will need BSC and platform walker for LUE. HHOT to follow to continue to address self care and functional transfers.       If plan is discharge home, recommend the following:  Two people to help with walking and/or transfers;A lot of help with bathing/dressing/bathroom;Assistance with cooking/housework;Assist for transportation;Help with stairs or ramp for entrance   Equipment Recommendations  Other (comment) (defer)    Recommendations for Other Services      Precautions / Restrictions Precautions Precautions: Fall Restrictions Weight Bearing Restrictions: Yes LUE Weight Bearing: Non weight bearing LLE Weight Bearing: Weight bearing as tolerated Other Position/Activity Restrictions: can WB through LUE with platform walker       Mobility Bed  Mobility Overal bed mobility: Needs Assistance Bed Mobility: Supine to Sit, Sit to Supine     Supine to sit: Max assist, HOB elevated, Used rails Sit to supine: Max assist, +2 for physical assistance   General bed mobility comments: instructions on rail use to assistance and assistance with BLEs and trunk    Transfers Overall transfer level: Needs assistance Equipment used: 2 person hand held assist, None Transfers: Sit to/from Stand, Bed to chair/wheelchair/BSC Sit to Stand: Mod assist, +2 physical assistance     Step pivot transfers: Mod assist, +2 physical assistance     General transfer comment: transfer from EOB to recliner with mod assist for face to face technique. Transer to [facility] and back to recliner with 2 person HHA and mod assist +2 with cues throughout. Max assist for face to face technique back to EOB     Balance Overall balance assessment: Needs assistance Sitting-balance support: No upper extremity supported, Feet supported Sitting balance-Leahy Scale: Poor Sitting balance - Comments: right lateral leaning and min to CGA Postural control: Right lateral lean Standing balance support: Bilateral upper extremity supported, During functional activity, Reliant on assistive device for balance Standing balance-Leahy Scale: Poor Standing balance comment: reliant on UE support with support provided at elbow for LUE                           ADL either performed or assessed with clinical judgement   ADL Overall ADL's : Needs assistance/impaired                         Toilet Transfer: Moderate assistance;+2 for physical assistance;BSC/3in1 Toilet Transfer Details (indicate  cue type and reason): HHA Toileting- Clothing Manipulation and Hygiene: Maximal assistance;Sit to/from stand         General ADL Comments: Patient attempting to assist bowel movement while seated    Extremity/Trunk Assessment              Vision       Perception      Praxis      Cognition Arousal: Alert Behavior During Therapy: WFL for tasks assessed/performed Overall Cognitive Status: Impaired/Different from baseline Area of Impairment: Memory, Following commands, Safety/judgement                     Memory: Decreased recall of precautions, Decreased short-term memory Following Commands: Follows one step commands consistently, Follows one step commands with increased time Safety/Judgement: Decreased awareness of safety, Decreased awareness of deficits     General Comments: requires cues for WB precautions and need for assistance        Exercises      Shoulder Instructions       General Comments VSS on RA    Pertinent Vitals/ Pain       Pain Assessment Pain Assessment: Faces Faces Pain Scale: Hurts little more Pain Location: Left Elbow and LLE with mobility Pain Descriptors / Indicators: Discomfort, Grimacing Pain Intervention(s): Limited activity within patient's tolerance, Monitored during session, Repositioned  Home Living                                          Prior Functioning/Environment              Frequency  Min 1X/week        Progress Toward Goals  OT Goals(current goals can now be found in the care plan section)  Progress towards OT goals: Progressing toward goals  Acute Rehab OT Goals Patient Stated Goal: go home OT Goal Formulation: With patient/family Time For Goal Achievement: 10/12/23 Potential to Achieve Goals: Good ADL Goals Pt Will Perform Upper Body Dressing: with min assist;sitting Pt Will Transfer to Toilet: with min assist;bedside commode;stand pivot transfer Pt Will Perform Toileting - Clothing Manipulation and hygiene: with min assist;sitting/lateral leans  Plan      Co-evaluation                 AM-PAC OT "6 Clicks" Daily Activity     Outcome Measure   Help from another person eating meals?: A Little Help from another person taking care of  personal grooming?: A Little Help from another person toileting, which includes using toliet, bedpan, or urinal?: A Lot Help from another person bathing (including washing, rinsing, drying)?: A Lot Help from another person to put on and taking off regular upper body clothing?: A Lot Help from another person to put on and taking off regular lower body clothing?: A Lot 6 Click Score: 14    End of Session Equipment Utilized During Treatment: Gait belt  OT Visit Diagnosis: Unsteadiness on feet (R26.81);Other abnormalities of gait and mobility (R26.89);Muscle weakness (generalized) (M62.81);History of falling (Z91.81);Pain Pain - Right/Left: Left Pain - part of body: Leg;Arm   Activity Tolerance Patient tolerated treatment well   Patient Left in bed;with call bell/phone within reach;with bed alarm set;with family/visitor present   Nurse Communication Mobility status;Other (comment) (need for platform for RW for discharge home)        Time: 1324-4010 OT Time Calculation (min): 25  min  Charges: OT General Charges $OT Visit: 1 Visit OT Treatments $Self Care/Home Management : 23-37 mins  Alfonse Flavors, OTA Acute Rehabilitation Services  Office [phone]   Dewain Penning 10/01/2023, 11:03 AM

## 2023-10-03 ENCOUNTER — Telehealth: Payer: Self-pay

## 2023-10-03 ENCOUNTER — Encounter: Payer: Self-pay | Admitting: Family Medicine

## 2023-10-03 NOTE — Transitions of Care (Post Inpatient/ED Visit) (Signed)
10/03/2023  Name: Holly [name] MRN: [id number] DOB: 06/15/1928  Today's TOC FU Call Status: Today's TOC FU Call Status:: Successful TOC FU Call Completed TOC FU Call Complete Date: 10/03/23 (Spoke with granddaughter-Angela) Patient's Name and Date of Birth confirmed.  Transition Care Management Follow-up Telephone Call Date of Discharge: 10/01/23 Discharge Facility: [facility]) Type of Discharge: Inpatient Admission Primary Inpatient Discharge Diagnosis:: "closed fracture of left hip" How have you been since you were released from the hospital?: Better (Pt is getting up but having painwith movement-taking Oxycodone about 2x/day as well as Tylenol, appetite decreased-but not new per family, having BMs, hallucinations have decreased) Any questions or concerns?: No  Items Reviewed: Did you receive and understand the discharge instructions provided?: Yes Medications obtained,verified, and reconciled?: Yes (Medications Reviewed) Any new allergies since your discharge?: No Dietary orders reviewed?: Yes Type of Diet Ordered:: low salt/heart healthy Do you have support at home?: Yes People in Home: child(ren), adult, other relative(s) Name of Support/Comfort Primary Source: pt's children and grnandchidlren assist with her care  Medications Reviewed Today: Medications Reviewed Today     Reviewed by Charlyn Minerva, RN (Registered Nurse) on 10/03/23 at 1016  Med List Status: <None>   Medication Order Taking? Sig Documenting Provider Last Dose Status Informant  acetaminophen (TYLENOL) 500 MG tablet [id number] Yes Take 500-1,000 mg by mouth See admin instructions. Take 1 tablet by mouth twice a day and 2 tablets at bedtime [provider] Taking Active Family Member  albuterol (VENTOLIN HFA) 108 (90 Base) MCG/ACT inhaler [id number] Yes INHALE 1-2 PUFFS BY MOUTH EVERY 6 HOURS AS NEEDED FOR WHEEZE OR SHORTNESS OF BREATH Ardith Dark, MD Taking Active Family Member   aspirin EC 81 MG tablet [id number] Yes Take 1 tablet (81 mg total) by mouth in the morning and at bedtime for 30 days, THEN 1 tablet (81 mg total) daily. Swallow whole.Almon Hercules, MD Taking Active   atorvastatin (LIPITOR) 40 MG tablet [id number] Yes Take 1 tablet (40 mg total) by mouth daily. Ardith Dark, MD Taking Active Family Member  budesonide-formoterol [facility]) 160-4.5 MCG/ACT inhaler [id number] Yes Inhale 2 puffs into the lungs 2 (two) times daily. Ardith Dark, MD Taking Active Family Member  cholecalciferol (CHOLECALCIFEROL) 25 MCG tablet [id number] Yes Take 2 tablets (2,000 Units total) by mouth daily. [place]ali, PA-C Taking Active   levothyroxine (SYNTHROID) 112 MCG tablet [id number] Yes TAKE 1 TABLET BY MOUTH EVERY DAY Ardith Dark, MD Taking Active Family Member  metoprolol tartrate (LOPRESSOR) 25 MG tablet [id number] Yes Take 0.5 tablets (12.5 mg total) by mouth 2 (two) times daily. Almon Hercules, MD Taking Active   Multiple Vitamins-Minerals (ICAPS PO) [id number] Yes Take 1 capsule by mouth 2 (two) times daily. [provider] Taking Active Family Member  oxyCODONE (OXY IR/ROXICODONE) 5 MG immediate release tablet [id number] Yes Take 0.5-1 tablets (2.5-5 mg total) by mouth every 4 (four) hours as needed (2.5 mg for pain score 4-6, 5 mg for pain score 7-10). [place] Bali, PA-C Taking Active   senna-docusate (SENOKOT-S) 8.6-50 MG tablet [id number] Yes Take 1 tablet by mouth 2 (two) times daily between meals as needed for mild constipation. Almon Hercules, MD Taking Active             Home Care and Equipment/Supplies: Were Home Health Services Ordered?: Yes Name of Home Health Agency:: [facility] Has Agency set up a time to come to your home?: No (provided granddaughter with  HH agency contact info-advised that HH should contact within 48hrs of discharge and to call of she does not hear from them) Any new equipment or medical supplies ordered?: Yes Name of  Medical supply agency?: [employer] Were you able to get the equipment/medical supplies?: No (granddaughter states DME to be delivered to the home-not needing it yet as she is not walking aorund much) Do you have any questions related to the use of the equipment/supplies?: No  Functional Questionnaire: Do you need assistance with bathing/showering or dressing?: Yes (family assists with ADLs/IADLs) Do you need assistance with meal preparation?: Yes Do you need assistance with eating?: No Do you have difficulty maintaining continence: Yes Do you need assistance with getting out of bed/getting out of a chair/moving?: Yes Do you have difficulty managing or taking your medications?: Yes  Follow up appointments reviewed: PCP Follow-up appointment confirmed?: No (Granddaughter states that her aunt makes pt's appts-she will follow up with her and have PCP appt made- aware of importance of PCP f/u in regards to BP mgmt and new BP med-encouraged to start monitoring BP in home) MD Provider Line Number:[phone] Given: No Specialist Hospital Follow-up appointment confirmed?: No Reason Specialist Follow-Up Not Confirmed: Patient has Specialist Provider Number and will Call for Appointment Do you need transportation to your follow-up appointment?: No Do you understand care options if your condition(s) worsen?: Yes-patient verbalized understanding  SDOH Interventions Today    Flowsheet Row Most Recent Value  SDOH Interventions   Food Insecurity Interventions Intervention Not Indicated  Transportation Interventions Intervention Not Indicated       Antionette Fairy, RN,BSN,CCM RN Care Manager Transitions of Care  [place]  Direct Phone: [phone] Toll Free: [phone] Fax: [phone]

## 2023-10-04 NOTE — Telephone Encounter (Signed)
See note

## 2023-10-05 ENCOUNTER — Other Ambulatory Visit: Payer: Self-pay | Admitting: *Deleted

## 2023-10-05 DIAGNOSIS — Z111 Encounter for screening for respiratory tuberculosis: Secondary | ICD-10-CM

## 2023-10-05 NOTE — Telephone Encounter (Signed)
We can refer to home health. Ok to place order for quantiferon gold if needed to screen for TB.  Katina Degree. Jimmey Ralph, MD 10/05/2023 10:34 AM

## 2023-10-05 NOTE — Telephone Encounter (Signed)
Left message to return call to our office at their convenience to schedule a lab appt for TB test

## 2023-10-06 DIAGNOSIS — S72142A Displaced intertrochanteric fracture of left femur, initial encounter for closed fracture: Secondary | ICD-10-CM | POA: Diagnosis not present

## 2023-10-06 DIAGNOSIS — E785 Hyperlipidemia, unspecified: Secondary | ICD-10-CM | POA: Diagnosis not present

## 2023-10-06 DIAGNOSIS — M79602 Pain in left arm: Secondary | ICD-10-CM | POA: Diagnosis not present

## 2023-10-06 DIAGNOSIS — L821 Other seborrheic keratosis: Secondary | ICD-10-CM | POA: Diagnosis not present

## 2023-10-06 DIAGNOSIS — L89152 Pressure ulcer of sacral region, stage 2: Secondary | ICD-10-CM | POA: Diagnosis present

## 2023-10-06 DIAGNOSIS — K5900 Constipation, unspecified: Secondary | ICD-10-CM | POA: Diagnosis not present

## 2023-10-06 DIAGNOSIS — Z87891 Personal history of nicotine dependence: Secondary | ICD-10-CM | POA: Diagnosis not present

## 2023-10-06 DIAGNOSIS — R441 Visual hallucinations: Secondary | ICD-10-CM | POA: Diagnosis not present

## 2023-10-06 DIAGNOSIS — I959 Hypotension, unspecified: Secondary | ICD-10-CM | POA: Diagnosis not present

## 2023-10-06 DIAGNOSIS — R112 Nausea with vomiting, unspecified: Secondary | ICD-10-CM | POA: Diagnosis not present

## 2023-10-06 DIAGNOSIS — L89102 Pressure ulcer of unspecified part of back, stage 2: Secondary | ICD-10-CM | POA: Diagnosis not present

## 2023-10-06 DIAGNOSIS — R197 Diarrhea, unspecified: Secondary | ICD-10-CM | POA: Diagnosis not present

## 2023-10-06 DIAGNOSIS — G4710 Hypersomnia, unspecified: Secondary | ICD-10-CM | POA: Diagnosis not present

## 2023-10-06 DIAGNOSIS — R58 Hemorrhage, not elsewhere classified: Secondary | ICD-10-CM | POA: Diagnosis not present

## 2023-10-06 DIAGNOSIS — R4182 Altered mental status, unspecified: Secondary | ICD-10-CM | POA: Diagnosis not present

## 2023-10-06 DIAGNOSIS — D649 Anemia, unspecified: Secondary | ICD-10-CM | POA: Diagnosis not present

## 2023-10-06 DIAGNOSIS — Z681 Body mass index (BMI) 19 or less, adult: Secondary | ICD-10-CM | POA: Diagnosis not present

## 2023-10-06 DIAGNOSIS — Z8673 Personal history of transient ischemic attack (TIA), and cerebral infarction without residual deficits: Secondary | ICD-10-CM | POA: Diagnosis not present

## 2023-10-06 DIAGNOSIS — S51002A Unspecified open wound of left elbow, initial encounter: Secondary | ICD-10-CM | POA: Diagnosis not present

## 2023-10-06 DIAGNOSIS — S42432D Displaced fracture (avulsion) of lateral epicondyle of left humerus, subsequent encounter for fracture with routine healing: Secondary | ICD-10-CM | POA: Diagnosis not present

## 2023-10-06 DIAGNOSIS — K529 Noninfective gastroenteritis and colitis, unspecified: Secondary | ICD-10-CM | POA: Diagnosis not present

## 2023-10-06 DIAGNOSIS — Z4789 Encounter for other orthopedic aftercare: Secondary | ICD-10-CM | POA: Diagnosis not present

## 2023-10-06 DIAGNOSIS — Z7189 Other specified counseling: Secondary | ICD-10-CM | POA: Diagnosis not present

## 2023-10-06 DIAGNOSIS — J449 Chronic obstructive pulmonary disease, unspecified: Secondary | ICD-10-CM | POA: Diagnosis not present

## 2023-10-06 DIAGNOSIS — J440 Chronic obstructive pulmonary disease with acute lower respiratory infection: Secondary | ICD-10-CM | POA: Diagnosis present

## 2023-10-06 DIAGNOSIS — L89023 Pressure ulcer of left elbow, stage 3: Secondary | ICD-10-CM | POA: Diagnosis present

## 2023-10-06 DIAGNOSIS — L89611 Pressure ulcer of right heel, stage 1: Secondary | ICD-10-CM | POA: Diagnosis not present

## 2023-10-06 DIAGNOSIS — G3184 Mild cognitive impairment, so stated: Secondary | ICD-10-CM | POA: Diagnosis not present

## 2023-10-06 DIAGNOSIS — E039 Hypothyroidism, unspecified: Secondary | ICD-10-CM | POA: Diagnosis not present

## 2023-10-06 DIAGNOSIS — Z79890 Hormone replacement therapy (postmenopausal): Secondary | ICD-10-CM | POA: Diagnosis not present

## 2023-10-06 DIAGNOSIS — F5104 Psychophysiologic insomnia: Secondary | ICD-10-CM | POA: Diagnosis not present

## 2023-10-06 DIAGNOSIS — S72142D Displaced intertrochanteric fracture of left femur, subsequent encounter for closed fracture with routine healing: Secondary | ICD-10-CM | POA: Diagnosis not present

## 2023-10-06 DIAGNOSIS — S32512D Fracture of superior rim of left pubis, subsequent encounter for fracture with routine healing: Secondary | ICD-10-CM | POA: Diagnosis not present

## 2023-10-06 DIAGNOSIS — R0602 Shortness of breath: Secondary | ICD-10-CM | POA: Diagnosis not present

## 2023-10-06 DIAGNOSIS — F411 Generalized anxiety disorder: Secondary | ICD-10-CM | POA: Diagnosis not present

## 2023-10-06 DIAGNOSIS — F419 Anxiety disorder, unspecified: Secondary | ICD-10-CM | POA: Diagnosis present

## 2023-10-06 DIAGNOSIS — L89150 Pressure ulcer of sacral region, unstageable: Secondary | ICD-10-CM | POA: Diagnosis not present

## 2023-10-06 DIAGNOSIS — R059 Cough, unspecified: Secondary | ICD-10-CM | POA: Diagnosis not present

## 2023-10-06 DIAGNOSIS — Z515 Encounter for palliative care: Secondary | ICD-10-CM | POA: Diagnosis not present

## 2023-10-06 DIAGNOSIS — I2510 Atherosclerotic heart disease of native coronary artery without angina pectoris: Secondary | ICD-10-CM | POA: Diagnosis present

## 2023-10-06 DIAGNOSIS — L89156 Pressure-induced deep tissue damage of sacral region: Secondary | ICD-10-CM | POA: Diagnosis not present

## 2023-10-06 DIAGNOSIS — E440 Moderate protein-calorie malnutrition: Secondary | ICD-10-CM | POA: Diagnosis present

## 2023-10-06 DIAGNOSIS — R918 Other nonspecific abnormal finding of lung field: Secondary | ICD-10-CM | POA: Diagnosis not present

## 2023-10-06 DIAGNOSIS — Z9889 Other specified postprocedural states: Secondary | ICD-10-CM | POA: Diagnosis not present

## 2023-10-06 DIAGNOSIS — I10 Essential (primary) hypertension: Secondary | ICD-10-CM | POA: Diagnosis present

## 2023-10-06 DIAGNOSIS — L89621 Pressure ulcer of left heel, stage 1: Secondary | ICD-10-CM | POA: Diagnosis not present

## 2023-10-06 DIAGNOSIS — K922 Gastrointestinal hemorrhage, unspecified: Secondary | ICD-10-CM | POA: Diagnosis not present

## 2023-10-06 DIAGNOSIS — E46 Unspecified protein-calorie malnutrition: Secondary | ICD-10-CM | POA: Diagnosis not present

## 2023-10-06 DIAGNOSIS — W19XXXD Unspecified fall, subsequent encounter: Secondary | ICD-10-CM | POA: Diagnosis not present

## 2023-10-06 DIAGNOSIS — D72829 Elevated white blood cell count, unspecified: Secondary | ICD-10-CM | POA: Diagnosis not present

## 2023-10-06 DIAGNOSIS — R509 Fever, unspecified: Secondary | ICD-10-CM | POA: Diagnosis not present

## 2023-10-06 DIAGNOSIS — H9193 Unspecified hearing loss, bilateral: Secondary | ICD-10-CM | POA: Diagnosis not present

## 2023-10-06 DIAGNOSIS — E43 Unspecified severe protein-calorie malnutrition: Secondary | ICD-10-CM | POA: Diagnosis not present

## 2023-10-06 DIAGNOSIS — M25552 Pain in left hip: Secondary | ICD-10-CM | POA: Diagnosis not present

## 2023-10-06 DIAGNOSIS — Z66 Do not resuscitate: Secondary | ICD-10-CM | POA: Diagnosis present

## 2023-10-06 DIAGNOSIS — S42402D Unspecified fracture of lower end of left humerus, subsequent encounter for fracture with routine healing: Secondary | ICD-10-CM | POA: Diagnosis not present

## 2023-10-06 DIAGNOSIS — R627 Adult failure to thrive: Secondary | ICD-10-CM | POA: Diagnosis present

## 2023-10-06 DIAGNOSIS — L89894 Pressure ulcer of other site, stage 4: Secondary | ICD-10-CM | POA: Diagnosis present

## 2023-10-06 DIAGNOSIS — F5102 Adjustment insomnia: Secondary | ICD-10-CM | POA: Diagnosis not present

## 2023-10-06 DIAGNOSIS — J9811 Atelectasis: Secondary | ICD-10-CM | POA: Diagnosis not present

## 2023-10-06 DIAGNOSIS — R109 Unspecified abdominal pain: Secondary | ICD-10-CM | POA: Diagnosis not present

## 2023-10-09 DIAGNOSIS — E039 Hypothyroidism, unspecified: Secondary | ICD-10-CM | POA: Diagnosis not present

## 2023-10-09 DIAGNOSIS — K5900 Constipation, unspecified: Secondary | ICD-10-CM | POA: Diagnosis not present

## 2023-10-09 DIAGNOSIS — S72142A Displaced intertrochanteric fracture of left femur, initial encounter for closed fracture: Secondary | ICD-10-CM | POA: Diagnosis not present

## 2023-10-09 DIAGNOSIS — Z9889 Other specified postprocedural states: Secondary | ICD-10-CM | POA: Diagnosis not present

## 2023-10-09 DIAGNOSIS — R059 Cough, unspecified: Secondary | ICD-10-CM | POA: Diagnosis not present

## 2023-10-09 DIAGNOSIS — L89611 Pressure ulcer of right heel, stage 1: Secondary | ICD-10-CM | POA: Diagnosis not present

## 2023-10-09 DIAGNOSIS — E46 Unspecified protein-calorie malnutrition: Secondary | ICD-10-CM | POA: Diagnosis not present

## 2023-10-09 DIAGNOSIS — L89621 Pressure ulcer of left heel, stage 1: Secondary | ICD-10-CM | POA: Diagnosis not present

## 2023-10-10 DIAGNOSIS — F5102 Adjustment insomnia: Secondary | ICD-10-CM | POA: Diagnosis not present

## 2023-10-10 DIAGNOSIS — R441 Visual hallucinations: Secondary | ICD-10-CM | POA: Diagnosis not present

## 2023-10-12 DIAGNOSIS — L89102 Pressure ulcer of unspecified part of back, stage 2: Secondary | ICD-10-CM | POA: Diagnosis not present

## 2023-10-12 DIAGNOSIS — L89156 Pressure-induced deep tissue damage of sacral region: Secondary | ICD-10-CM | POA: Diagnosis not present

## 2023-10-13 ENCOUNTER — Inpatient Hospital Stay: Payer: Medicare Other | Admitting: Family Medicine

## 2023-10-14 ENCOUNTER — Ambulatory Visit: Payer: Medicare Other | Admitting: Family Medicine

## 2023-10-14 DIAGNOSIS — G4710 Hypersomnia, unspecified: Secondary | ICD-10-CM | POA: Diagnosis not present

## 2023-10-14 DIAGNOSIS — E46 Unspecified protein-calorie malnutrition: Secondary | ICD-10-CM | POA: Diagnosis not present

## 2023-10-14 DIAGNOSIS — Z9889 Other specified postprocedural states: Secondary | ICD-10-CM | POA: Diagnosis not present

## 2023-10-14 DIAGNOSIS — S72142D Displaced intertrochanteric fracture of left femur, subsequent encounter for closed fracture with routine healing: Secondary | ICD-10-CM | POA: Diagnosis not present

## 2023-10-17 DIAGNOSIS — S72142D Displaced intertrochanteric fracture of left femur, subsequent encounter for closed fracture with routine healing: Secondary | ICD-10-CM | POA: Diagnosis not present

## 2023-10-17 DIAGNOSIS — S42402D Unspecified fracture of lower end of left humerus, subsequent encounter for fracture with routine healing: Secondary | ICD-10-CM | POA: Diagnosis not present

## 2023-10-18 ENCOUNTER — Other Ambulatory Visit: Payer: Self-pay | Admitting: *Deleted

## 2023-10-18 NOTE — Patient Outreach (Signed)
Post- Acute Care Manager follow up. Mrs. Holly [name] resides in [facility] skilled nursing facility.  Screening for potential chronic care management services as a benefit of health plan and primary care provider.  Collaboration with Holly [name], Programmer, systems. [name]'s family wants her to return home. However, [name] is making slow progress. Requires hoyer lift. Holly [name] reports facility offered a LTC bed. Transition plans pending progress.   Will continue to follow.  Raiford Noble, MSN, RN, BSN [place]  [facility], [facility] RN Post- Acute Care Manager Direct Dial: [phone]

## 2023-10-19 DIAGNOSIS — L89156 Pressure-induced deep tissue damage of sacral region: Secondary | ICD-10-CM | POA: Diagnosis not present

## 2023-10-19 DIAGNOSIS — L89102 Pressure ulcer of unspecified part of back, stage 2: Secondary | ICD-10-CM | POA: Diagnosis not present

## 2023-10-23 ENCOUNTER — Other Ambulatory Visit: Payer: Self-pay | Admitting: Family Medicine

## 2023-10-24 DIAGNOSIS — F5102 Adjustment insomnia: Secondary | ICD-10-CM | POA: Diagnosis not present

## 2023-10-24 DIAGNOSIS — G3184 Mild cognitive impairment, so stated: Secondary | ICD-10-CM | POA: Diagnosis not present

## 2023-10-26 DIAGNOSIS — L89102 Pressure ulcer of unspecified part of back, stage 2: Secondary | ICD-10-CM | POA: Diagnosis not present

## 2023-10-26 DIAGNOSIS — L89156 Pressure-induced deep tissue damage of sacral region: Secondary | ICD-10-CM | POA: Diagnosis not present

## 2023-10-26 DIAGNOSIS — S51002A Unspecified open wound of left elbow, initial encounter: Secondary | ICD-10-CM | POA: Diagnosis not present

## 2023-10-27 ENCOUNTER — Inpatient Hospital Stay (HOSPITAL_COMMUNITY)
Admission: EM | Admit: 2023-10-27 | Discharge: 2023-11-01 | DRG: 391 | Disposition: A | Discharge status: Hospice | Payer: Medicare Other | Source: Skilled Nursing Facility | Attending: Internal Medicine | Admitting: Internal Medicine

## 2023-10-27 ENCOUNTER — Emergency Department (HOSPITAL_COMMUNITY): Payer: Medicare Other

## 2023-10-27 ENCOUNTER — Encounter (HOSPITAL_COMMUNITY): Payer: Self-pay

## 2023-10-27 DIAGNOSIS — R109 Unspecified abdominal pain: Secondary | ICD-10-CM | POA: Diagnosis not present

## 2023-10-27 DIAGNOSIS — J440 Chronic obstructive pulmonary disease with acute lower respiratory infection: Secondary | ICD-10-CM | POA: Diagnosis present

## 2023-10-27 DIAGNOSIS — Z79890 Hormone replacement therapy (postmenopausal): Secondary | ICD-10-CM | POA: Diagnosis not present

## 2023-10-27 DIAGNOSIS — S42432D Displaced fracture (avulsion) of lateral epicondyle of left humerus, subsequent encounter for fracture with routine healing: Secondary | ICD-10-CM | POA: Diagnosis not present

## 2023-10-27 DIAGNOSIS — I2510 Atherosclerotic heart disease of native coronary artery without angina pectoris: Secondary | ICD-10-CM | POA: Diagnosis present

## 2023-10-27 DIAGNOSIS — E039 Hypothyroidism, unspecified: Secondary | ICD-10-CM | POA: Diagnosis not present

## 2023-10-27 DIAGNOSIS — K219 Gastro-esophageal reflux disease without esophagitis: Secondary | ICD-10-CM | POA: Diagnosis not present

## 2023-10-27 DIAGNOSIS — S72142D Displaced intertrochanteric fracture of left femur, subsequent encounter for closed fracture with routine healing: Secondary | ICD-10-CM | POA: Diagnosis not present

## 2023-10-27 DIAGNOSIS — E440 Moderate protein-calorie malnutrition: Secondary | ICD-10-CM | POA: Diagnosis present

## 2023-10-27 DIAGNOSIS — K922 Gastrointestinal hemorrhage, unspecified: Secondary | ICD-10-CM | POA: Diagnosis not present

## 2023-10-27 DIAGNOSIS — J9811 Atelectasis: Secondary | ICD-10-CM | POA: Diagnosis not present

## 2023-10-27 DIAGNOSIS — S72002A Fracture of unspecified part of neck of left femur, initial encounter for closed fracture: Secondary | ICD-10-CM | POA: Diagnosis not present

## 2023-10-27 DIAGNOSIS — L89894 Pressure ulcer of other site, stage 4: Secondary | ICD-10-CM | POA: Diagnosis present

## 2023-10-27 DIAGNOSIS — Z87891 Personal history of nicotine dependence: Secondary | ICD-10-CM

## 2023-10-27 DIAGNOSIS — I5020 Unspecified systolic (congestive) heart failure: Secondary | ICD-10-CM | POA: Diagnosis not present

## 2023-10-27 DIAGNOSIS — Z681 Body mass index (BMI) 19 or less, adult: Secondary | ICD-10-CM | POA: Diagnosis not present

## 2023-10-27 DIAGNOSIS — Z515 Encounter for palliative care: Secondary | ICD-10-CM

## 2023-10-27 DIAGNOSIS — W19XXXD Unspecified fall, subsequent encounter: Secondary | ICD-10-CM | POA: Diagnosis present

## 2023-10-27 DIAGNOSIS — I10 Essential (primary) hypertension: Secondary | ICD-10-CM | POA: Diagnosis not present

## 2023-10-27 DIAGNOSIS — K529 Noninfective gastroenteritis and colitis, unspecified: Principal | ICD-10-CM | POA: Diagnosis present

## 2023-10-27 DIAGNOSIS — L89152 Pressure ulcer of sacral region, stage 2: Secondary | ICD-10-CM | POA: Diagnosis present

## 2023-10-27 DIAGNOSIS — R627 Adult failure to thrive: Secondary | ICD-10-CM | POA: Diagnosis present

## 2023-10-27 DIAGNOSIS — Z66 Do not resuscitate: Secondary | ICD-10-CM | POA: Diagnosis present

## 2023-10-27 DIAGNOSIS — E785 Hyperlipidemia, unspecified: Secondary | ICD-10-CM | POA: Diagnosis present

## 2023-10-27 DIAGNOSIS — D500 Iron deficiency anemia secondary to blood loss (chronic): Secondary | ICD-10-CM | POA: Diagnosis not present

## 2023-10-27 DIAGNOSIS — I259 Chronic ischemic heart disease, unspecified: Secondary | ICD-10-CM | POA: Diagnosis not present

## 2023-10-27 DIAGNOSIS — L89023 Pressure ulcer of left elbow, stage 3: Secondary | ICD-10-CM | POA: Diagnosis present

## 2023-10-27 DIAGNOSIS — R112 Nausea with vomiting, unspecified: Secondary | ICD-10-CM | POA: Diagnosis present

## 2023-10-27 DIAGNOSIS — R58 Hemorrhage, not elsewhere classified: Secondary | ICD-10-CM | POA: Diagnosis not present

## 2023-10-27 DIAGNOSIS — R4182 Altered mental status, unspecified: Secondary | ICD-10-CM | POA: Diagnosis not present

## 2023-10-27 DIAGNOSIS — J449 Chronic obstructive pulmonary disease, unspecified: Secondary | ICD-10-CM | POA: Diagnosis present

## 2023-10-27 DIAGNOSIS — F419 Anxiety disorder, unspecified: Secondary | ICD-10-CM | POA: Diagnosis present

## 2023-10-27 DIAGNOSIS — R918 Other nonspecific abnormal finding of lung field: Secondary | ICD-10-CM | POA: Diagnosis not present

## 2023-10-27 DIAGNOSIS — R197 Diarrhea, unspecified: Secondary | ICD-10-CM | POA: Diagnosis not present

## 2023-10-27 DIAGNOSIS — D72829 Elevated white blood cell count, unspecified: Secondary | ICD-10-CM | POA: Diagnosis present

## 2023-10-27 DIAGNOSIS — Z8673 Personal history of transient ischemic attack (TIA), and cerebral infarction without residual deficits: Secondary | ICD-10-CM | POA: Diagnosis not present

## 2023-10-27 DIAGNOSIS — Z7189 Other specified counseling: Secondary | ICD-10-CM | POA: Diagnosis not present

## 2023-10-27 DIAGNOSIS — G311 Senile degeneration of brain, not elsewhere classified: Secondary | ICD-10-CM | POA: Diagnosis not present

## 2023-10-27 DIAGNOSIS — R509 Fever, unspecified: Secondary | ICD-10-CM | POA: Diagnosis not present

## 2023-10-27 DIAGNOSIS — S32512D Fracture of superior rim of left pubis, subsequent encounter for fracture with routine healing: Secondary | ICD-10-CM | POA: Diagnosis not present

## 2023-10-27 LAB — PROTIME-INR
INR: 1.1 (ref 0.8–1.2)
Prothrombin Time: 14.8 s (ref 11.4–15.2)

## 2023-10-27 LAB — COMPREHENSIVE METABOLIC PANEL
ALT: 10 U/L (ref 0–44)
AST: 41 U/L (ref 15–41)
Albumin: 2 g/dL — ABNORMAL LOW (ref 3.5–5.0)
Alkaline Phosphatase: 128 U/L — ABNORMAL HIGH (ref 38–126)
Anion gap: 11 (ref 5–15)
BUN: 29 mg/dL — ABNORMAL HIGH (ref 8–23)
CO2: 19 mmol/L — ABNORMAL LOW (ref 22–32)
Calcium: 7.5 mg/dL — ABNORMAL LOW (ref 8.9–10.3)
Chloride: 110 mmol/L (ref 98–111)
Creatinine, Ser: 0.72 mg/dL (ref 0.44–1.00)
GFR, Estimated: >60 mL/min (ref >60)
Glucose, Bld: 110 mg/dL — ABNORMAL HIGH (ref 70–99)
Potassium: 3.8 mmol/L (ref 3.5–5.1)
Sodium: 140 mmol/L (ref 135–145)
Total Bilirubin: 1.5 mg/dL — ABNORMAL HIGH (ref <1.2)
Total Protein: 5.4 g/dL — ABNORMAL LOW (ref 6.5–8.1)

## 2023-10-27 LAB — BRAIN NATRIURETIC PEPTIDE: B Natriuretic Peptide: 588.9 pg/mL — ABNORMAL HIGH (ref 0.0–100.0)

## 2023-10-27 LAB — CBC WITH DIFFERENTIAL/PLATELET
Abs Immature Granulocytes: 0.26 10*3/uL — ABNORMAL HIGH (ref 0.00–0.07)
Basophils Absolute: 0.1 10*3/uL (ref 0.0–0.1)
Basophils Relative: 0 %
Eosinophils Absolute: 0 10*3/uL (ref 0.0–0.5)
Eosinophils Relative: 0 %
HCT: 42.7 % (ref 36.0–46.0)
Hemoglobin: 13.6 g/dL (ref 12.0–15.0)
Immature Granulocytes: 1 %
Lymphocytes Relative: 3 %
Lymphs Abs: 0.9 10*3/uL (ref 0.7–4.0)
MCH: 31.3 pg (ref 26.0–34.0)
MCHC: 31.9 g/dL (ref 30.0–36.0)
MCV: 98.2 fL (ref 80.0–100.0)
Monocytes Absolute: 1.1 10*3/uL — ABNORMAL HIGH (ref 0.1–1.0)
Monocytes Relative: 3 %
Neutro Abs: 30.7 10*3/uL — ABNORMAL HIGH (ref 1.7–7.7)
Neutrophils Relative %: 93 %
Platelets: 311 10*3/uL (ref 150–400)
RBC: 4.35 MIL/uL (ref 3.87–5.11)
RDW: 21.7 % — ABNORMAL HIGH (ref 11.5–15.5)
WBC: 33 10*3/uL — ABNORMAL HIGH (ref 4.0–10.5)
nRBC: 0 % (ref 0.0–0.2)

## 2023-10-27 LAB — HEMOGLOBIN AND HEMATOCRIT, BLOOD
HCT: 45.1 % (ref 36.0–46.0)
Hemoglobin: 14.6 g/dL (ref 12.0–15.0)

## 2023-10-27 LAB — LACTIC ACID, PLASMA: Lactic Acid, Venous: 1.9 mmol/L (ref 0.5–1.9)

## 2023-10-27 LAB — PROCALCITONIN: Procalcitonin: 0.1 ng/mL

## 2023-10-27 LAB — PREALBUMIN: Prealbumin: 8 mg/dL — ABNORMAL LOW (ref 18–38)

## 2023-10-27 LAB — T4, FREE: Free T4: 1.4 ng/dL — ABNORMAL HIGH (ref 0.61–1.12)

## 2023-10-27 LAB — TSH: TSH: 7.08 u[IU]/mL — ABNORMAL HIGH (ref 0.350–4.500)

## 2023-10-27 LAB — TYPE AND SCREEN
ABO/RH(D): O POS
Antibody Screen: NEGATIVE

## 2023-10-27 MED ORDER — IOHEXOL 350 MG/ML SOLN
75.0000 mL | Freq: Once | INTRAVENOUS | Status: AC | PRN
  Administered 2023-10-27: 75 mL via INTRAVENOUS

## 2023-10-27 MED ORDER — PANTOPRAZOLE SODIUM 40 MG IV SOLR
40.0000 mg | Freq: Two times a day (BID) | INTRAVENOUS | Status: DC
  Administered 2023-10-27 – 2023-10-30 (×6): 40 mg via INTRAVENOUS
  Filled 2023-10-27 (×8): qty 10

## 2023-10-27 MED ORDER — ACETAMINOPHEN 650 MG RE SUPP: 650.0000 mg | Freq: Four times a day (QID) | RECTAL | Status: DC | PRN

## 2023-10-27 MED ORDER — SODIUM CHLORIDE 0.9% FLUSH
3.0000 mL | Freq: Two times a day (BID) | INTRAVENOUS | Status: DC
Start: 2023-10-27 — End: 2023-10-31
  Administered 2023-10-27 – 2023-10-30 (×7): 3 mL via INTRAVENOUS

## 2023-10-27 MED ORDER — ACETAMINOPHEN 325 MG PO TABS: 650.0000 mg | ORAL_TABLET | Freq: Four times a day (QID) | ORAL | Status: DC | PRN

## 2023-10-27 MED ORDER — SODIUM CHLORIDE 0.9 % IV SOLN
2.0000 g | INTRAVENOUS | Status: DC
  Administered 2023-10-28 – 2023-10-29 (×2): 2 g via INTRAVENOUS
  Filled 2023-10-27 (×2): qty 20

## 2023-10-27 MED ORDER — ATORVASTATIN CALCIUM 40 MG PO TABS
40.0000 mg | ORAL_TABLET | Freq: Every day | ORAL | Status: DC
  Administered 2023-10-28 – 2023-10-31 (×4): 40 mg via ORAL
  Filled 2023-10-27 (×4): qty 1

## 2023-10-27 MED ORDER — SODIUM CHLORIDE 0.9 % IV SOLN: INTRAVENOUS | Status: DC

## 2023-10-27 MED ORDER — SODIUM CHLORIDE 0.9 % IV BOLUS
500.0000 mL | Freq: Once | INTRAVENOUS | Status: AC
  Administered 2023-10-27: 500 mL via INTRAVENOUS

## 2023-10-27 MED ORDER — METRONIDAZOLE 500 MG/100ML IV SOLN
500.0000 mg | Freq: Two times a day (BID) | INTRAVENOUS | Status: DC
  Administered 2023-10-28 – 2023-10-30 (×6): 500 mg via INTRAVENOUS
  Filled 2023-10-27 (×5): qty 100

## 2023-10-27 MED ORDER — QUETIAPINE 12.5 MG HALF TABLET
12.5000 mg | ORAL_TABLET | Freq: Every day | ORAL | Status: DC
  Administered 2023-10-29 – 2023-10-31 (×3): 12.5 mg via ORAL
  Filled 2023-10-27 (×8): qty 1

## 2023-10-27 MED ORDER — METOPROLOL TARTRATE 12.5 MG HALF TABLET
12.5000 mg | ORAL_TABLET | Freq: Two times a day (BID) | ORAL | Status: DC
  Administered 2023-10-27 – 2023-10-31 (×8): 12.5 mg via ORAL
  Filled 2023-10-27 (×8): qty 1

## 2023-10-27 MED ORDER — ONDANSETRON HCL 4 MG/2ML IJ SOLN
4.0000 mg | Freq: Once | INTRAMUSCULAR | Status: AC
  Administered 2023-10-27: 4 mg via INTRAVENOUS
  Filled 2023-10-27: qty 2

## 2023-10-27 MED ORDER — ALBUTEROL SULFATE (2.5 MG/3ML) 0.083% IN NEBU: 2.5000 mg | INHALATION_SOLUTION | Freq: Four times a day (QID) | RESPIRATORY_TRACT | Status: DC | PRN

## 2023-10-27 MED ORDER — OXYCODONE HCL 5 MG PO TABS
2.5000 mg | ORAL_TABLET | ORAL | Status: DC | PRN
  Administered 2023-10-28 – 2023-10-31 (×4): 5 mg via ORAL
  Filled 2023-10-27 (×4): qty 1

## 2023-10-27 MED ORDER — SACCHAROMYCES BOULARDII 250 MG PO CAPS
250.0000 mg | ORAL_CAPSULE | Freq: Two times a day (BID) | ORAL | Status: DC
  Administered 2023-10-27 – 2023-10-31 (×8): 250 mg via ORAL
  Filled 2023-10-27 (×8): qty 1

## 2023-10-27 NOTE — H&P (Addendum)
History and Physical    Patient: Holly [name]:[id number] DOB: 08-11-1928 DOA: 10/27/2023 DOS: the patient was seen and examined on 10/27/2023 PCP: Ardith Dark, MD  Patient coming from: [facility] via EMS  Chief Complaint:  Chief Complaint  Patient presents with   Nausea   Emesis   HPI: Holly [name] is a 89 y.o. female with medical history significant of hyperlipidemia, CHF, CAD, hypothyroidism, COPD, anxiety, and GERD who presents with complaints of diarrhea.  Patient just recently been hospitalized from 10/27-11/2 after having a fall found to have a leftfemoral intertrochanteric and left humeral epicondylar fractures.  She underwent cephalomedullary nailing of left intertrochanteric femoral fracture by Dr. Jena Gauss on 10/28. Ortho recommended nonoperative management for left humeral fracture.  Hospital course have been complicated by postoperative acute blood loss and acute kidney injury for which patient was transfused 4 units of packed red blood cells.  She had been discharged home after she initially refused to go to a skilled nursing facility.  Patient was normally ambulatory with a walker, but following the recent hip fracture have been unable to able to take care of herself at home and family ultimately put her in [facility] nursing facility.  There was dark red blood noted in her stool yesterday evening, though the granddaughter did not personally observe this.  Last night she had also had nausea and vomiting she denies any current stomach pain or shortness of breath. The patient has dentures, which were not brought to the hospital, necessitating a soft diet. She has two pressure wounds, one on her back and one on her bottom, which were being treated at [facility] prior to admission.    In the emergency department patient was noted to be afebrile, pulse 59-70, respirations 16-23,  and all other vital signs maintained.  Labs significant for WBC 33 with left shift  and burr cells present, BUN 29, creatinine 0.72, calcium 7.5, alkaline phosphatase 128,  and total bilirubin 1.5 chest x-ray noted chronic interstitial thickening with stable lung hyperexpansion, bibasilar opacities favoring atelectasis, but on the differential included pneumonia.  CT scan of the abdomen pelvis showed moderate wall thickening involving multiple distal small bowel loops in the pelvis consistent with enteritis and diffuse mesenteric and body wall edema, small bilateral pleural effusions and the pended atelectasis and healing fractures involving the left sacral ilia and left pubic rami.  Patient had been given Zofran and 500 mL bolus of normal saline IV fluids.  GI panel and C. difficile studies had been ordered  Review of Systems: As mentioned in the history of present illness. All other systems reviewed and are negative. Past Medical History:  Diagnosis Date   CAD (coronary artery disease)    CELLULITIS 06/18/2008   CHF (congestive heart failure) (HCC)    CHRONIC OBSTRUCTIVE PULMONARY DISEASE, ACUTE EXACERBATION 11/16/2007   COPD 05/18/2007   COVID-19    GERD 12/25/2007   HYPERLIPIDEMIA 05/18/2007   HYPOTHYROIDISM 05/18/2007   Macular degeneration    Stroke (HCC) 08/2018   Urosepsis    Past Surgical History:  Procedure Laterality Date   CATARACT EXTRACTION     DILATION AND CURETTAGE OF UTERUS     INTRAMEDULLARY (IM) NAIL INTERTROCHANTERIC Left 09/26/2023   Procedure: INTRAMEDULLARY (IM) NAIL INTERTROCHANTERIC;  Surgeon: Roby Lofts, MD;  Location: [facility] OR;  Service: Orthopedics;  Laterality: Left;   KYPHOPLASTY N/A 05/02/2020   Procedure: Thoracic eleven KYPHOPLASTY;  Surgeon: Coletta Memos, MD;  Location: [facility] OR;  Service:  Neurosurgery;  Laterality: N/A;  3C   TEE WITHOUT CARDIOVERSION N/A 09/01/2018   Procedure: TRANSESOPHAGEAL ECHOCARDIOGRAM (TEE);  Surgeon: Jodelle Red, MD;  Location: [facility] ENDOSCOPY;  Service: Cardiovascular;  Laterality: N/A;   VARICOSE VEIN SURGERY      Social History:  reports that she quit smoking about 37 years ago. Her smoking use included cigarettes. She started smoking about 77 years ago. She has never used smokeless tobacco. She reports that she does not drink alcohol and does not use drugs.  Allergies  Allergen Reactions   Levofloxacin     Unknown reaction   Macrobid [Nitrofurantoin Macrocrystal] Nausea Only   Sulfamethoxazole     Unknown reaction   Tramadol Other (See Comments)    Shakes    Amoxicillin Rash    Has patient had a PCN reaction causing immediate rash, facial/tongue/throat swelling, SOB or lightheadedness with hypotension: No Has patient had a PCN reaction causing severe rash involving mucus membranes or skin necrosis: No Has patient had a PCN reaction that required hospitalization: No Has patient had a PCN reaction occurring within the last 10 years: No If all of the above answers are "NO", then may proceed with Cephalosporin use.   Made "bottom area" very raw    Family History  Problem Relation Age of Onset   Ovarian cancer Mother    Diabetes Mellitus II Brother     Prior to Admission medications   Medication Sig Start Date End Date Taking? Authorizing Provider  acetaminophen (TYLENOL) 500 MG tablet Take 500-1,000 mg by mouth See admin instructions. Take 1 tablet by mouth twice a day and 2 tablets at bedtime    [provider]  albuterol (VENTOLIN HFA) 108 (90 Base) MCG/ACT inhaler INHALE 1-2 PUFFS BY MOUTH EVERY 6 HOURS AS NEEDED FOR WHEEZE OR SHORTNESS OF BREATH 07/11/23   Ardith Dark, MD  aspirin EC 81 MG tablet Take 1 tablet (81 mg total) by mouth in the morning and at bedtime for 30 days, THEN 1 tablet (81 mg total) daily. Swallow whole.. 10/01/23 11/30/23  Almon Hercules, MD  atorvastatin (LIPITOR) 40 MG tablet Take 1 tablet (40 mg total) by mouth daily. 01/18/23   Ardith Dark, MD  budesonide-formoterol [facility]) 160-4.5 MCG/ACT inhaler Inhale 2 puffs into the lungs 2 (two) times  daily. 04/15/23   Ardith Dark, MD  cholecalciferol (CHOLECALCIFEROL) 25 MCG tablet Take 2 tablets (2,000 Units total) by mouth daily. 09/28/23 12/27/23  West Bali, PA-C  levothyroxine (SYNTHROID) 112 MCG tablet TAKE 1 TABLET BY MOUTH EVERY DAY 10/24/23   Ardith Dark, MD  metoprolol tartrate (LOPRESSOR) 25 MG tablet Take 0.5 tablets (12.5 mg total) by mouth 2 (two) times daily. 10/01/23   Almon Hercules, MD  Multiple Vitamins-Minerals (ICAPS PO) Take 1 capsule by mouth 2 (two) times daily.    [provider]  oxyCODONE (OXY IR/ROXICODONE) 5 MG immediate release tablet Take 0.5-1 tablets (2.5-5 mg total) by mouth every 4 (four) hours as needed (2.5 mg for pain score 4-6, 5 mg for pain score 7-10). 09/28/23   West Bali, PA-C  senna-docusate (SENOKOT-S) 8.6-50 MG tablet Take 1 tablet by mouth 2 (two) times daily between meals as needed for mild constipation. 09/30/23   Almon Hercules, MD    Physical Exam: Vitals:   10/27/23 1345 10/27/23 1400 10/27/23 1510 10/27/23 1511  BP: (!) 144/69 130/67 130/69   Pulse: 69 66 70   Resp: 16 16 (!) 23  Temp:      TempSrc:      SpO2: 98% 96% 97% 100%    Constitutional: Cachectic  elderly female who appears chronically ill Eyes: PERRL, lids and conjunctivae normal ENMT: Mucous membranes are dry.  Edentulous. Neck: normal, supple, no JVD. Respiratory: clear to auscultation bilaterally, no wheezing, no crackles. Normal respiratory effort.   Cardiovascular: Regular rate and rhythm, with 3/6 systolic murmur present.  No edema.   Abdomen: no tenderness, no masses palpated.  Bowel sounds positive.  Musculoskeletal: no clubbing / cyanosis. No joint deformity upper and lower extremities. Good ROM, no contractures. Normal muscle tone.  Skin: Pressure wounds noted of the sacrum and back. Neurologic: CN 2-12 grossly intact. Sensation intact, DTR normal. Strength 5/5 in all 4.  Psychiatric: Normal judgment and insight. Alert and oriented x 3.  Normal mood.   Data Reviewed:  Reviewed labs, imaging, and pertinent records as documented.  Assessment and Plan:  Enteritis with rectal bleeding Diarrhea Acute. Patient presents with acute onset of diarrhea yesterday evening that was reported to be dark red in color.  Hemoglobin noted to be 13.6 but suspect this is secondary to hemoconcentration as prior to discharge from 11/4 was noted to be 8.6.   CT imaging concerning for enteritis.  Patient had been typed and screened for possible need of blood products. -Admit to a telemetry bed -Strict intake and output -Follow-up GI panel and stool studies -Serial monitoring of H&H.  Consider need of transfusion for  counts less than 8 g/dL or patient is hemodynamic unstable -Start empiric antibiotics of Rocephin and metronidazole -Consult gi possibly in am.  Nausea and vomiting Acute.  Thought secondary to enteritis. -Aspiration precautions with elevation head of the bed. -Protonix IV twice daily -Antiemetics as needed  Leukocytosis Acute.  WBC elevated at 33 with left shift and presence of burr cells.  Suspect secondary to above.  Other possible source of infection includes pressure ulcers. -Check blood culture and lactic acid -Recheck CBC tomorrow morning -Antibiotics as noted above  Essential hypertension Blood pressure is noted to be elevated up to 158/71. -Continue metoprolol  COPD Stable. -Continue inhalers as needed for shortness of breath/wheezing  Recent history of left femoral intertrochanteric fracture and Left humeral epicondylar fracture due to fall at home Patient underwent surgical management for the left femoral intertrochanteric fracture with Dr. Jena Gauss on 10/28, but nonoperative management was recommended for the humeral fracture.  Since the fracture patient had not been very mobile -PT/OT to eval and treat  History of stroke -Hold aspirin due to concern for GI bleed  History of NSVT -Continue  metoprolol  Hypothyroidism TSH was 2.73 on 05/16/2023. -Check TSH -Continue levothyroxine  Anxiety disorder -During last hospitalization patient had been recommended to discontinue Ativan due to increasing fall risk.  Protein calorie malnutrition Albumin noted to be 2 which appears similar prior. - check Prealbumin  DVT prophylaxis: SCDs  Advance Care Planning:   Code Status: Do not attempt resuscitation (DNR) PRE-ARREST INTERVENTIONS DESIRED   Consults: None  Family Communication: Granddaughter updated at bedside  Severity of Illness: The appropriate patient status for this patient is OBSERVATION. Observation status is judged to be reasonable and necessary in order to provide the required intensity of service to ensure the patient's safety. The patient's presenting symptoms, physical exam findings, and initial radiographic and laboratory data in the context of their medical condition is felt to place them at decreased risk for further clinical deterioration. Furthermore, it is anticipated that the patient  will be medically stable for discharge from the hospital within 2 midnights of admission.   Author: Clydie Braun, MD 10/27/2023 3:33 PM  For on call review [email or web address].

## 2023-10-27 NOTE — ED Notes (Signed)
ED TO INPATIENT HANDOFF REPORT  ED Nurse Name and Phone #:  Corliss Blacker, RN [phone]  S Name/Age/Gender Holly [name] 89 y.o. female Room/Bed: 023C/023C  Code Status   Code Status: Prior  Home/SNF/Other Nursing Home Patient oriented to: self Is this baseline? No   Triage Complete: Triage complete  Chief Complaint Enteritis [K52.9]  Triage Note Pt BIB EMS from [facility] nursing home for c/o n/v and diarrhea that started yesterday. Facility reports dark red blood in her stool, failure to thrive, fatigue, A/O x1 at baseline. Facility reports pressure sores along spine. Pt had oxycodone at 0830. Hx of COPD. DNR at bedside  132/60 91% on room air 69 CBG 139   Allergies Allergies  Allergen Reactions   Levofloxacin     Unknown reaction   Macrobid [Nitrofurantoin Macrocrystal] Nausea Only   Sulfamethoxazole     Unknown reaction   Tramadol Other (See Comments)    Shakes    Amoxicillin Rash    Has patient had a PCN reaction causing immediate rash, facial/tongue/throat swelling, SOB or lightheadedness with hypotension: No Has patient had a PCN reaction causing severe rash involving mucus membranes or skin necrosis: No Has patient had a PCN reaction that required hospitalization: No Has patient had a PCN reaction occurring within the last 10 years: No If all of the above answers are "NO", then may proceed with Cephalosporin use.   Made "bottom area" very raw    Level of Care/Admitting Diagnosis ED Disposition     ED Disposition  Admit   Condition  --   Comment  Hospital Area: [facility] [[id number]]  Level of Care: Telemetry Medical [104]  May place patient in observation at [facility] or [facility] if equivalent level of care is available:: No  Covid Evaluation: Asymptomatic - no recent exposure (last 10 days) testing not required  Diagnosis: Enteritis [557322]  Admitting Physician: Clydie Braun [[id number]]  Attending Physician: Clydie Braun  [[id number]]          B Medical/Surgery History Past Medical History:  Diagnosis Date   CAD (coronary artery disease)    CELLULITIS 06/18/2008   CHF (congestive heart failure) (HCC)    CHRONIC OBSTRUCTIVE PULMONARY DISEASE, ACUTE EXACERBATION 11/16/2007   COPD 05/18/2007   COVID-19    GERD 12/25/2007   HYPERLIPIDEMIA 05/18/2007   HYPOTHYROIDISM 05/18/2007   Macular degeneration    Stroke (HCC) 08/2018   Urosepsis    Past Surgical History:  Procedure Laterality Date   CATARACT EXTRACTION     DILATION AND CURETTAGE OF UTERUS     INTRAMEDULLARY (IM) NAIL INTERTROCHANTERIC Left 09/26/2023   Procedure: INTRAMEDULLARY (IM) NAIL INTERTROCHANTERIC;  Surgeon: Roby Lofts, MD;  Location: [facility] OR;  Service: Orthopedics;  Laterality: Left;   KYPHOPLASTY N/A 05/02/2020   Procedure: Thoracic eleven KYPHOPLASTY;  Surgeon: Coletta Memos, MD;  Location: [facility] OR;  Service: Neurosurgery;  Laterality: N/A;  3C   TEE WITHOUT CARDIOVERSION N/A 09/01/2018   Procedure: TRANSESOPHAGEAL ECHOCARDIOGRAM (TEE);  Surgeon: Jodelle Red, MD;  Location: [facility] ENDOSCOPY;  Service: Cardiovascular;  Laterality: N/A;   VARICOSE VEIN SURGERY       A IV Location/Drains/Wounds Patient Lines/Drains/Airways Status     Active Line/Drains/Airways     Name Placement date Placement time Site Days   Peripheral IV 10/27/23 22 G Right Forearm 10/27/23  1215  Forearm  less than 1   Peripheral IV 10/27/23 20 G Right Antecubital 10/27/23  1253  Antecubital  less than 1  Intake/Output Last 24 hours No intake or output data in the 24 hours ending 10/27/23 1554  Labs/Imaging Results for orders placed or performed during the hospital encounter of 10/27/23 (from the past 48 hour(s))  Type and screen [facility]     Status: None   Collection Time: 10/27/23 12:46 PM  Result Value Ref Range   ABO/RH(D) O POS    Antibody Screen NEG    Sample Expiration      10/30/2023,2359 Performed at  [facility], [address] [place], [place] [address]   Protime-INR     Status: None   Collection Time: 10/27/23  1:29 PM  Result Value Ref Range   Prothrombin Time 14.8 11.4 - 15.2 seconds   INR 1.1 0.8 - 1.2    Comment: (NOTE) INR goal varies based on device and disease states. Performed at [facility], [address] [place], [place] [address]   CBC with Differential/Platelet     Status: Abnormal   Collection Time: 10/27/23  1:48 PM  Result Value Ref Range   WBC 33.0 (H) 4.0 - 10.5 K/uL   RBC 4.35 3.87 - 5.11 MIL/uL   Hemoglobin 13.6 12.0 - 15.0 g/dL   HCT 65.7 84.6 - 96.2 %   MCV 98.2 80.0 - 100.0 fL   MCH 31.3 26.0 - 34.0 pg   MCHC 31.9 30.0 - 36.0 g/dL   RDW 95.2 (H) 84.1 - 32.4 %   Platelets 311 150 - 400 K/uL    Comment: REPEATED TO VERIFY   nRBC 0.0 0.0 - 0.2 %   Neutrophils Relative % 93 %   Neutro Abs 30.7 (H) 1.7 - 7.7 K/uL   Lymphocytes Relative 3 %   Lymphs Abs 0.9 0.7 - 4.0 K/uL   Monocytes Relative 3 %   Monocytes Absolute 1.1 (H) 0.1 - 1.0 K/uL   Eosinophils Relative 0 %   Eosinophils Absolute 0.0 0.0 - 0.5 K/uL   Basophils Relative 0 %   Basophils Absolute 0.1 0.0 - 0.1 K/uL   RBC Morphology See Note    Immature Granulocytes 1 %   Abs Immature Granulocytes 0.26 (H) 0.00 - 0.07 K/uL   Burr Cells PRESENT     Comment: Performed at [facility], [address], [place], [place] [address]  Comprehensive metabolic panel     Status: Abnormal   Collection Time: 10/27/23  1:48 PM  Result Value Ref Range   Sodium 140 135 - 145 mmol/L   Potassium 3.8 3.5 - 5.1 mmol/L    Comment: HEMOLYSIS AT THIS LEVEL MAY AFFECT RESULT   Chloride 110 98 - 111 mmol/L   CO2 19 (L) 22 - 32 mmol/L   Glucose, Bld 110 (H) 70 - 99 mg/dL    Comment: Glucose reference range applies only to samples taken after fasting for at least 8 hours.   BUN 29 (H) 8 - 23 mg/dL   Creatinine, Ser 7.25 0.44 - 1.00 mg/dL   Calcium 7.5 (L) 8.9 - 10.3 mg/dL   Total Protein 5.4  (L) 6.5 - 8.1 g/dL   Albumin 2.0 (L) 3.5 - 5.0 g/dL   AST 41 15 - 41 U/L    Comment: HEMOLYSIS AT THIS LEVEL MAY AFFECT RESULT   ALT 10 0 - 44 U/L    Comment: HEMOLYSIS AT THIS LEVEL MAY AFFECT RESULT   Alkaline Phosphatase 128 (H) 38 - 126 U/L   Total Bilirubin 1.5 (H) <1.2 mg/dL  Comment: HEMOLYSIS AT THIS LEVEL MAY AFFECT RESULT   GFR, Estimated >60 >60 mL/min    Comment: (NOTE) Calculated using the CKD-EPI Creatinine Equation (2021)    Anion gap 11 5 - 15    Comment: Performed at [facility], [address], [place], [place] [address]   CT ABDOMEN PELVIS W CONTRAST  Result Date: 10/27/2023 CLINICAL DATA:  Acute non localized abdominal pain. EXAM: CT ABDOMEN AND PELVIS WITH CONTRAST TECHNIQUE: Multidetector CT imaging of the abdomen and pelvis was performed using the standard protocol following bolus administration of intravenous contrast. RADIATION DOSE REDUCTION: This exam was performed according to the departmental dose-optimization program which includes automated exposure control, adjustment of the mA and/or kV according to patient size and/or use of iterative reconstruction technique. CONTRAST:  75mL OMNIPAQUE IOHEXOL 350 MG/ML SOLN COMPARISON:  Noncontrast CT on 09/25/2023 FINDINGS: Lower Chest: New small bilateral pleural effusions and dependent atelectasis. Hepatobiliary: No suspicious hepatic masses identified. Gallbladder is unremarkable. No evidence of biliary ductal dilatation. Pancreas:  No mass or inflammatory changes. Spleen: Within normal limits in size and appearance. Adrenals/Urinary Tract: No suspicious masses identified. No evidence of ureteral calculi or hydronephrosis. Stomach/Bowel: Moderate wall thickening is seen involving multiple distal small bowel loops in the pelvis. No No evidence bowel obstruction Diverticulosis is seen mainly involving the sigmoid colon, however there is no evidence of diverticulitis. Diffuse mesenteric and body wall edema also noted.  Vascular/Lymphatic: No pathologically enlarged lymph nodes. No acute vascular findings. Reproductive:  No mass or other significant abnormality. Other:  None. Musculoskeletal: No suspicious bone lesions identified. Healing insufficiency fracture is seen involving the left sacral ala. Several healing, mildly displaced fractures are seen involving the left superior and inferior pubic rami. Internal fixation hardware noted in the left hip. Old T11 vertebroplasty also noted. IMPRESSION: Moderate wall thickening involving multiple distal small bowel loops in the pelvis, consistent with enteritis. Diffuse mesenteric and body wall edema. New small bilateral pleural effusions and dependent atelectasis. Healing fractures involving the left sacral ala and left pubic rami. Electronically Signed   By: Danae Orleans M.D.   On: 10/27/2023 15:08   DG Chest Portable 1 View  Result Date: 10/27/2023 CLINICAL DATA:  Altered mental status. EXAM: PORTABLE CHEST 1 VIEW COMPARISON:  09/25/2023 and older studies. FINDINGS: Cardiac silhouette is normal in size. No mediastinal or hilar masses. Lungs are hyperexpanded. Bilateral interstitial thickening, similar to the prior exam. Additional opacity noted at the lung bases suspected to be atelectasis. Consider pneumonia in the proper clinical setting. No convincing pleural effusion.  No pneumothorax. Skeletal structures are demineralized, grossly intact. IMPRESSION: 1. Chronic interstitial thickening.  Stable lung hyperexpansion. 2. Bibasilar opacity, favor atelectasis. Consider pneumonia in the proper clinical setting. Electronically Signed   By: Amie Portland M.D.   On: 10/27/2023 12:47    Pending Labs Unresulted Labs (From admission, onward)     Start     Ordered   10/27/23 1531  Gastrointestinal Panel by PCR , Stool  (Gastrointestinal Panel by PCR, Stool                                                                                                                                                      **  Does Not include CLOSTRIDIUM DIFFICILE testing. **If CDIFF testing is needed, place order from the "C Difficile Testing" order set.**)  Once,   URGENT        10/27/23 1530   10/27/23 1531  C Difficile Quick Screen w PCR reflex  (C Difficile quick screen w PCR reflex panel )  Once, for 24 hours,   URGENT       References:    CDiff Information Tool   10/27/23 1530   10/27/23 1158  Urinalysis, w/ Reflex to Culture (Infection Suspected) -Urine, Unspecified Source  Once,   URGENT       Question:  Specimen Source  Answer:  Urine, Unspecified Source   10/27/23 1157   10/27/23 1150  CBC with Differential  Once,   STAT        10/27/23 1155            Vitals/Pain Today's Vitals   10/27/23 1400 10/27/23 1510 10/27/23 1511 10/27/23 1547  BP: 130/67 130/69    Pulse: 66 70    Resp: 16 (!) 23    Temp:    98.7 F (37.1 C)  TempSrc:    Oral  SpO2: 96% 97% 100%     Isolation Precautions No active isolations  Medications Medications  sodium chloride 0.9 % bolus 500 mL (has no administration in time range)  ondansetron (ZOFRAN) injection 4 mg (4 mg Intravenous Given 10/27/23 1212)  sodium chloride 0.9 % bolus 500 mL (500 mLs Intravenous New Bag/Given 10/27/23 1215)  iohexol (OMNIPAQUE) 350 MG/ML injection 75 mL (75 mLs Intravenous Contrast Given 10/27/23 1445)    Mobility non-ambulatory     Focused Assessments   R Recommendations: See Admitting Provider Note  Report given to:   Additional Notes:  Family at bedside.

## 2023-10-27 NOTE — ED Triage Notes (Signed)
Pt BIB EMS from [facility] nursing home for c/o n/v and diarrhea that started yesterday. Facility reports dark red blood in her stool, failure to thrive, fatigue, A/O x1 at baseline. Facility reports pressure sores along spine. Pt had oxycodone at 0830. Hx of COPD. DNR at bedside  132/60 91% on room air 69 CBG 139

## 2023-10-27 NOTE — ED Provider Notes (Signed)
[place] [facility] AT [facility] Provider Note   CSN: [id number] Arrival date & time: 10/27/23  1126     History  Chief Complaint  Patient presents with   Nausea   Emesis    Holly [name] is a 89 y.o. female.   Emesis Patient presents with weakness..  Reportedly has had some diarrhea yesterday.  Reportedly dark emesis and has had some blood in the stool.  Patient's family here and states she is normally ambulatory with a walker.  Although did have recent hip fracture.  Reportedly does hallucinate at baseline however.    Past Medical History:  Diagnosis Date   CAD (coronary artery disease)    CELLULITIS 06/18/2008   CHF (congestive heart failure) (HCC)    CHRONIC OBSTRUCTIVE PULMONARY DISEASE, ACUTE EXACERBATION 11/16/2007   COPD 05/18/2007   COVID-19    GERD 12/25/2007   HYPERLIPIDEMIA 05/18/2007   HYPOTHYROIDISM 05/18/2007   Macular degeneration    Stroke (HCC) 08/2018   Urosepsis     Home Medications Prior to Admission medications   Medication Sig Start Date End Date Taking? Authorizing Provider  acetaminophen (TYLENOL) 500 MG tablet Take 500-1,000 mg by mouth See admin instructions. Take 1 tablet by mouth twice a day and 2 tablets at bedtime    [provider]  albuterol (VENTOLIN HFA) 108 (90 Base) MCG/ACT inhaler INHALE 1-2 PUFFS BY MOUTH EVERY 6 HOURS AS NEEDED FOR WHEEZE OR SHORTNESS OF BREATH 07/11/23   Ardith Dark, MD  aspirin EC 81 MG tablet Take 1 tablet (81 mg total) by mouth in the morning and at bedtime for 30 days, THEN 1 tablet (81 mg total) daily. Swallow whole.. 10/01/23 11/30/23  Almon Hercules, MD  atorvastatin (LIPITOR) 40 MG tablet Take 1 tablet (40 mg total) by mouth daily. 01/18/23   Ardith Dark, MD  budesonide-formoterol [facility]) 160-4.5 MCG/ACT inhaler Inhale 2 puffs into the lungs 2 (two) times daily. 04/15/23   Ardith Dark, MD  cholecalciferol (CHOLECALCIFEROL) 25 MCG tablet Take 2 tablets (2,000 Units  total) by mouth daily. 09/28/23 12/27/23  West Bali, PA-C  levothyroxine (SYNTHROID) 112 MCG tablet TAKE 1 TABLET BY MOUTH EVERY DAY 10/24/23   Ardith Dark, MD  metoprolol tartrate (LOPRESSOR) 25 MG tablet Take 0.5 tablets (12.5 mg total) by mouth 2 (two) times daily. 10/01/23   Almon Hercules, MD  Multiple Vitamins-Minerals (ICAPS PO) Take 1 capsule by mouth 2 (two) times daily.    [provider]  oxyCODONE (OXY IR/ROXICODONE) 5 MG immediate release tablet Take 0.5-1 tablets (2.5-5 mg total) by mouth every 4 (four) hours as needed (2.5 mg for pain score 4-6, 5 mg for pain score 7-10). 09/28/23   West Bali, PA-C  senna-docusate (SENOKOT-S) 8.6-50 MG tablet Take 1 tablet by mouth 2 (two) times daily between meals as needed for mild constipation. 09/30/23   Almon Hercules, MD      Allergies    Levofloxacin, Macrobid [nitrofurantoin macrocrystal], Sulfamethoxazole, Tramadol, and Amoxicillin    Review of Systems   Review of Systems  Gastrointestinal:  Positive for vomiting.    Physical Exam Updated Vital Signs BP 130/67   Pulse 66   Temp 99.8 F (37.7 C) (Rectal)   Resp 16   SpO2 96%  Physical Exam Vitals reviewed.  HENT:     Head: Normocephalic.  Cardiovascular:     Rate and Rhythm: Normal rate.  Pulmonary:     Breath sounds: No  wheezing.  Abdominal:     Tenderness: There is no abdominal tenderness.  Genitourinary:    Comments: Blood and potentially mucousy stool on rectal exam. Musculoskeletal:     Cervical back: Neck supple.     ED Results / Procedures / Treatments   Labs (all labs ordered are listed, but only abnormal results are displayed) Labs Reviewed  CBC WITH DIFFERENTIAL/PLATELET - Abnormal; Notable for the following components:      Result Value   WBC 33.0 (*)    RDW 21.7 (*)    All other components within normal limits  COMPREHENSIVE METABOLIC PANEL - Abnormal; Notable for the following components:   CO2 19 (*)    Glucose, Bld 110 (*)     BUN 29 (*)    Calcium 7.5 (*)    Total Protein 5.4 (*)    Albumin 2.0 (*)    Alkaline Phosphatase 128 (*)    Total Bilirubin 1.5 (*)    All other components within normal limits  PROTIME-INR  CBC WITH DIFFERENTIAL/PLATELET  URINALYSIS, W/ REFLEX TO CULTURE (INFECTION SUSPECTED)  TYPE AND SCREEN    EKG None  Radiology DG Chest Portable 1 View  Result Date: 10/27/2023 CLINICAL DATA:  Altered mental status. EXAM: PORTABLE CHEST 1 VIEW COMPARISON:  09/25/2023 and older studies. FINDINGS: Cardiac silhouette is normal in size. No mediastinal or hilar masses. Lungs are hyperexpanded. Bilateral interstitial thickening, similar to the prior exam. Additional opacity noted at the lung bases suspected to be atelectasis. Consider pneumonia in the proper clinical setting. No convincing pleural effusion.  No pneumothorax. Skeletal structures are demineralized, grossly intact. IMPRESSION: 1. Chronic interstitial thickening.  Stable lung hyperexpansion. 2. Bibasilar opacity, favor atelectasis. Consider pneumonia in the proper clinical setting. Electronically Signed   By: Amie Portland M.D.   On: 10/27/2023 12:47    Procedures Procedures    Medications Ordered in ED Medications  ondansetron (ZOFRAN) injection 4 mg (4 mg Intravenous Given 10/27/23 1212)  sodium chloride 0.9 % bolus 500 mL (500 mLs Intravenous New Bag/Given 10/27/23 1215)  iohexol (OMNIPAQUE) 350 MG/ML injection 75 mL (75 mLs Intravenous Contrast Given 10/27/23 1445)    ED Course/ Medical Decision Making/ A&P                                 Medical Decision Making Amount and/or Complexity of Data Reviewed Labs: ordered. Radiology: ordered.  Risk Prescription drug management.   Patient brought in reportedly throwing up with diarrhea.  Reportedly blood in diarrhea.  Rectal exam did show blood.  Differential diagnosis long but includes cause such colitis.  Reportedly has been on oxycodone for her hip fracture.  Blood  pressure reassuring.  Will get basic blood work and likely CT scan of the abdomen.  White count is elevated.  Benign abdominal exam.  Fluid bolus had been given.  Patient is afebrile.  CT scan done but results pending.  Will likely require admission to hospital.  Care turned over to oncoming provider.        Final Clinical Impression(s) / ED Diagnoses Final diagnoses:  Gastrointestinal hemorrhage, unspecified gastrointestinal hemorrhage type    Rx / DC Orders ED Discharge Orders     None         Benjiman Core, MD 10/27/23 1453

## 2023-10-28 DIAGNOSIS — E440 Moderate protein-calorie malnutrition: Secondary | ICD-10-CM | POA: Diagnosis present

## 2023-10-28 DIAGNOSIS — I2510 Atherosclerotic heart disease of native coronary artery without angina pectoris: Secondary | ICD-10-CM | POA: Diagnosis present

## 2023-10-28 DIAGNOSIS — L89023 Pressure ulcer of left elbow, stage 3: Secondary | ICD-10-CM | POA: Diagnosis present

## 2023-10-28 DIAGNOSIS — I259 Chronic ischemic heart disease, unspecified: Secondary | ICD-10-CM | POA: Diagnosis not present

## 2023-10-28 DIAGNOSIS — Z8673 Personal history of transient ischemic attack (TIA), and cerebral infarction without residual deficits: Secondary | ICD-10-CM | POA: Diagnosis not present

## 2023-10-28 DIAGNOSIS — S42432D Displaced fracture (avulsion) of lateral epicondyle of left humerus, subsequent encounter for fracture with routine healing: Secondary | ICD-10-CM | POA: Diagnosis not present

## 2023-10-28 DIAGNOSIS — K219 Gastro-esophageal reflux disease without esophagitis: Secondary | ICD-10-CM | POA: Diagnosis not present

## 2023-10-28 DIAGNOSIS — L89152 Pressure ulcer of sacral region, stage 2: Secondary | ICD-10-CM | POA: Diagnosis present

## 2023-10-28 DIAGNOSIS — S72142D Displaced intertrochanteric fracture of left femur, subsequent encounter for closed fracture with routine healing: Secondary | ICD-10-CM | POA: Diagnosis not present

## 2023-10-28 DIAGNOSIS — W19XXXD Unspecified fall, subsequent encounter: Secondary | ICD-10-CM | POA: Diagnosis present

## 2023-10-28 DIAGNOSIS — K922 Gastrointestinal hemorrhage, unspecified: Secondary | ICD-10-CM | POA: Diagnosis not present

## 2023-10-28 DIAGNOSIS — J440 Chronic obstructive pulmonary disease with acute lower respiratory infection: Secondary | ICD-10-CM | POA: Diagnosis present

## 2023-10-28 DIAGNOSIS — G311 Senile degeneration of brain, not elsewhere classified: Secondary | ICD-10-CM | POA: Diagnosis not present

## 2023-10-28 DIAGNOSIS — K529 Noninfective gastroenteritis and colitis, unspecified: Secondary | ICD-10-CM

## 2023-10-28 DIAGNOSIS — Z66 Do not resuscitate: Secondary | ICD-10-CM | POA: Diagnosis present

## 2023-10-28 DIAGNOSIS — I10 Essential (primary) hypertension: Secondary | ICD-10-CM | POA: Diagnosis present

## 2023-10-28 DIAGNOSIS — I5020 Unspecified systolic (congestive) heart failure: Secondary | ICD-10-CM | POA: Diagnosis not present

## 2023-10-28 DIAGNOSIS — E785 Hyperlipidemia, unspecified: Secondary | ICD-10-CM | POA: Diagnosis present

## 2023-10-28 DIAGNOSIS — L89894 Pressure ulcer of other site, stage 4: Secondary | ICD-10-CM | POA: Diagnosis present

## 2023-10-28 DIAGNOSIS — S72002A Fracture of unspecified part of neck of left femur, initial encounter for closed fracture: Secondary | ICD-10-CM | POA: Diagnosis not present

## 2023-10-28 DIAGNOSIS — Z7189 Other specified counseling: Secondary | ICD-10-CM | POA: Diagnosis not present

## 2023-10-28 DIAGNOSIS — R627 Adult failure to thrive: Secondary | ICD-10-CM | POA: Diagnosis present

## 2023-10-28 DIAGNOSIS — Z87891 Personal history of nicotine dependence: Secondary | ICD-10-CM | POA: Diagnosis not present

## 2023-10-28 DIAGNOSIS — F419 Anxiety disorder, unspecified: Secondary | ICD-10-CM | POA: Diagnosis present

## 2023-10-28 DIAGNOSIS — Z79890 Hormone replacement therapy (postmenopausal): Secondary | ICD-10-CM | POA: Diagnosis not present

## 2023-10-28 DIAGNOSIS — E039 Hypothyroidism, unspecified: Secondary | ICD-10-CM | POA: Diagnosis present

## 2023-10-28 DIAGNOSIS — J449 Chronic obstructive pulmonary disease, unspecified: Secondary | ICD-10-CM | POA: Diagnosis not present

## 2023-10-28 DIAGNOSIS — Z681 Body mass index (BMI) 19 or less, adult: Secondary | ICD-10-CM | POA: Diagnosis not present

## 2023-10-28 DIAGNOSIS — J9811 Atelectasis: Secondary | ICD-10-CM | POA: Diagnosis present

## 2023-10-28 DIAGNOSIS — Z515 Encounter for palliative care: Secondary | ICD-10-CM | POA: Diagnosis not present

## 2023-10-28 DIAGNOSIS — D500 Iron deficiency anemia secondary to blood loss (chronic): Secondary | ICD-10-CM | POA: Diagnosis not present

## 2023-10-28 LAB — CBC
HCT: 39.3 % (ref 36.0–46.0)
Hemoglobin: 12.6 g/dL (ref 12.0–15.0)
MCH: 31 pg (ref 26.0–34.0)
MCHC: 32.1 g/dL (ref 30.0–36.0)
MCV: 96.6 fL (ref 80.0–100.0)
Platelets: 294 10*3/uL (ref 150–400)
RBC: 4.07 MIL/uL (ref 3.87–5.11)
RDW: 21.7 % — ABNORMAL HIGH (ref 11.5–15.5)
WBC: 28.2 10*3/uL — ABNORMAL HIGH (ref 4.0–10.5)
nRBC: 0 % (ref 0.0–0.2)

## 2023-10-28 LAB — COMPREHENSIVE METABOLIC PANEL
ALT: 6 U/L (ref 0–44)
AST: 33 U/L (ref 15–41)
Albumin: 1.8 g/dL — ABNORMAL LOW (ref 3.5–5.0)
Alkaline Phosphatase: 101 U/L (ref 38–126)
Anion gap: 10 (ref 5–15)
BUN: 33 mg/dL — ABNORMAL HIGH (ref 8–23)
CO2: 23 mmol/L (ref 22–32)
Calcium: 8.1 mg/dL — ABNORMAL LOW (ref 8.9–10.3)
Chloride: 107 mmol/L (ref 98–111)
Creatinine, Ser: 0.8 mg/dL (ref 0.44–1.00)
GFR, Estimated: >60 mL/min (ref >60)
Glucose, Bld: 79 mg/dL (ref 70–99)
Potassium: 4.7 mmol/L (ref 3.5–5.1)
Sodium: 140 mmol/L (ref 135–145)
Total Bilirubin: 1.2 mg/dL — ABNORMAL HIGH (ref <1.2)
Total Protein: 4.5 g/dL — ABNORMAL LOW (ref 6.5–8.1)

## 2023-10-28 MED ORDER — LEVOTHYROXINE SODIUM 75 MCG PO TABS
150.0000 ug | ORAL_TABLET | Freq: Every day | ORAL | Status: DC
  Administered 2023-10-28 – 2023-10-31 (×4): 150 ug via ORAL
  Filled 2023-10-28 (×4): qty 2

## 2023-10-28 MED ORDER — ADULT MULTIVITAMIN W/MINERALS CH
1.0000 | ORAL_TABLET | Freq: Every day | ORAL | Status: DC
  Administered 2023-10-28 – 2023-10-31 (×4): 1 via ORAL
  Filled 2023-10-28 (×4): qty 1

## 2023-10-28 MED ORDER — MOMETASONE FURO-FORMOTEROL FUM 200-5 MCG/ACT IN AERO
2.0000 | INHALATION_SPRAY | Freq: Two times a day (BID) | RESPIRATORY_TRACT | Status: DC
  Administered 2023-10-29 – 2023-10-31 (×4): 2 via RESPIRATORY_TRACT
  Filled 2023-10-28: qty 8.8

## 2023-10-28 NOTE — Evaluation (Signed)
Occupational Therapy Evaluation Patient Details Name: Holly [name] MRN: [id number] DOB: 1928-09-13 Today's Date: 10/28/2023   History of Present Illness Pt presenting to [facility]/27/23 from [facility] w/ N/V and dark red blood in stool. Admitted w/ enteritis w/ rectal bleeding.  Prior admit 10/27-11/2 after fall s/p cephalomedullary nailing of L intertrochanteric femur fx w/ non-op L humeral epicondyle fx. PMH significant for COPD, seborrheic keratosis, hypothyroidism, anxiety, HLD.   Clinical Impression   Patient evaluated by Occupational Therapy with no further acute OT needs identified. All education has been completed and the patient has no further questions. See below for any follow-up Occupational Therapy or equipment needs. OT to sign off. Thank you for referral.   MD approved order of air mattress and prevalon boots by RN. Current bed does not operate properly.       If plan is discharge home, recommend the following:      Functional Status Assessment  Patient has had a recent decline in their functional status and demonstrates the ability to make significant improvements in function in a reasonable and predictable amount of time.  Equipment Recommendations  Hospital bed;Hoyer lift    Recommendations for Other Services       Precautions / Restrictions Precautions Precautions: Fall Restrictions Weight Bearing Restrictions: Yes LUE Weight Bearing: Non weight bearing LLE Weight Bearing: Weight bearing as tolerated Other Position/Activity Restrictions: can use L platform walker      Mobility Bed Mobility Overal bed mobility: Needs Assistance Bed Mobility: Rolling Rolling: Mod assist, +2 for safety/equipment, Used rails         General bed mobility comments: assist for rolling bilaterally at shoulders and hips, pt able to maintain position for >1 minute    Transfers                   General transfer comment: deferred      Balance Overall  balance assessment:  (deferred balance assessment)                                         ADL either performed or assessed with clinical judgement   ADL Overall ADL's : Needs assistance/impaired                                       General ADL Comments: pt noted to have BM so log rolled onto side for hygiene. pt falling asleep in side lying pt does hold bed rail when cued. pt rolling opposite direction with (A) to remove soiled linen. Family in room reports the plan is palliative comfort measures at SNF. recommendation for air mattress as current bed is broken and does not provide pressure relief. Requesting pravalon boots for bil heel support as pt reports they are painful to touch     Vision   Additional Comments: kept eyes closed most of the session. pt reports wanting to sleep     Perception         Praxis         Pertinent Vitals/Pain Pain Assessment Pain Assessment: No/denies pain     Extremity/Trunk Assessment Upper Extremity Assessment Upper Extremity Assessment: Generalized weakness   Lower Extremity Assessment Lower Extremity Assessment: Defer to PT evaluation   Cervical / Trunk Assessment Cervical / Trunk Assessment: Kyphotic  Communication Communication Communication: No apparent difficulties;Hearing impairment Cueing Techniques: Verbal cues;Tactile cues   Cognition Arousal: Lethargic Behavior During Therapy: Flat affect Overall Cognitive Status: Difficult to assess                                       General Comments  VSS on RA    Exercises     Shoulder Instructions      Home Living Family/patient expects to be discharged to:: Hospice/Palliative care                                        Prior Functioning/Environment Prior Level of Function : Needs assist       Physical Assist : Mobility (physical);ADLs (physical) Mobility (physical): Bed mobility;Transfers;Gait ADLs  (physical): Feeding;Grooming;Bathing;Dressing;Toileting;IADLs Mobility Comments: family reports pt has walked short distances w/ a RW prior to this hospital admission w/ staff ADLs Comments: needed assist for all ADLs and iADLs at facility        OT Problem List:        OT Treatment/Interventions:      OT Goals(Current goals can be found in the care plan section) Acute Rehab OT Goals Patient Stated Goal: to sleep  OT Frequency:      Co-evaluation PT/OT/SLP Co-Evaluation/Treatment: Yes Reason for Co-Treatment: Complexity of the patient's impairments (multi-system involvement);For patient/therapist safety;To address functional/ADL transfers PT goals addressed during session: Mobility/safety with mobility OT goals addressed during session: ADL's and self-care      AM-PAC OT "6 Clicks" Daily Activity     Outcome Measure Help from another person eating meals?: A Lot Help from another person taking care of personal grooming?: A Lot Help from another person toileting, which includes using toliet, bedpan, or urinal?: Total Help from another person bathing (including washing, rinsing, drying)?: Total Help from another person to put on and taking off regular upper body clothing?: Total Help from another person to put on and taking off regular lower body clothing?: A Lot 6 Click Score: 9   End of Session Nurse Communication: Mobility status;Precautions  Activity Tolerance: Patient tolerated treatment well Patient left: in bed;with call bell/phone within reach;with family/visitor present                   Time: 1610-9604 OT Time Calculation (min): 26 min Charges:  OT General Charges $OT Visit: 1 Visit OT Evaluation $OT Eval Moderate Complexity: 1 Mod   Brynn, OTR/L  Acute Rehabilitation Services Office: [phone] .   Mateo Flow 10/28/2023, 1:27 PM

## 2023-10-28 NOTE — Evaluation (Addendum)
Physical Therapy Evaluation Patient Details Name: Holly [name] MRN: [id number] DOB: Aug 14, 1928 Today's Date: 10/28/2023  History of Present Illness  Pt presenting to [facility] 10/27/23 from [facility] w/ N/V and dark red blood in stool. Admitted w/ enteritis w/ rectal bleeding.  Prior admit 10/27-11/2 after fall s/p cephalomedullary nailing of L intertrochanteric femur fx w/ non-op L humeral epicondyle fx. PMH significant for COPD, seborrheic keratosis, hypothyroidism, anxiety, HLD.   Clinical Impression  Pt in bed upon arrival with family present. Family informed therapists that pt is transitioning to comfort care upon d/c from hospital with no follow up for therapy. Repositioning pt and performed pericare due to loose stool. Pt was able to roll with ModAx2 for safety. Team notified for pt to have air mattress and prevalon boots to prevent further wounds. Acute PT to sign off. Please re-consult as appropriate with any changes.        If plan is discharge home, recommend the following: A lot of help with walking and/or transfers;A lot of help with bathing/dressing/bathroom;Assistance with cooking/housework;Assistance with feeding;Direct supervision/assist for medications management;Direct supervision/assist for financial management;Assist for transportation;Help with stairs or ramp for entrance   Can travel by private vehicle   No    Equipment Recommendations None recommended by PT     Functional Status Assessment Patient has had a recent decline in their functional status and/or demonstrates limited ability to make significant improvements in function in a reasonable and predictable amount of time     Precautions / Restrictions Precautions Precautions: Fall Restrictions Weight Bearing Restrictions: Yes LUE Weight Bearing: Non weight bearing LLE Weight Bearing: Weight bearing as tolerated Other Position/Activity Restrictions: can use L platform walker      Mobility  Bed  Mobility Overal bed mobility: Needs Assistance Bed Mobility: Rolling Rolling: Mod assist, +2 for safety/equipment, Used rails      General bed mobility comments: assist for rolling bilaterally at shoulders and hips, pt able to maintain position for >1 minute    Transfers  General transfer comment: deferred          Balance Overall balance assessment:  (deferred balance assessment)         Pertinent Vitals/Pain Pain Assessment Pain Assessment: No/denies pain    Home Living Family/patient expects to be discharged to:: Hospice/Palliative care      Prior Function Prior Level of Function : Needs assist       Physical Assist : Mobility (physical);ADLs (physical) Mobility (physical): Bed mobility;Transfers;Gait ADLs (physical): Feeding;Grooming;Bathing;Dressing;Toileting;IADLs Mobility Comments: family reports pt has walked short distances w/ a RW prior to this hospital admission w/ staff ADLs Comments: needed assist for all ADLs and iADLs at facility     Extremity/Trunk Assessment   Upper Extremity Assessment Upper Extremity Assessment: Defer to OT evaluation    Lower Extremity Assessment Lower Extremity Assessment: Generalized weakness (L LE at least 2/5, R LE 2+/5, denied numbness or tingling)    Cervical / Trunk Assessment Cervical / Trunk Assessment: Kyphotic  Communication   Communication Communication: No apparent difficulties;Hearing impairment Cueing Techniques: Verbal cues;Tactile cues  Cognition Arousal: Lethargic Behavior During Therapy: Flat affect Overall Cognitive Status: Difficult to assess       General Comments General comments (skin integrity, edema, etc.): VSS on RA, pt alert when asking questions and responds with a few words. Pt then closes eyes in attempt to sleep. Family informed therapists that pt is transitioning to comfort care. Sacral heart donned     PT Assessment Patient does not  need any further PT services                 Co-evaluation PT/OT/SLP Co-Evaluation/Treatment: Yes Reason for Co-Treatment: Complexity of the patient's impairments (multi-system involvement);For patient/therapist safety;To address functional/ADL transfers PT goals addressed during session: Mobility/safety with mobility         AM-PAC PT "6 Clicks" Mobility  Outcome Measure Help needed turning from your back to your side while in a flat bed without using bedrails?: A Lot Help needed moving from lying on your back to sitting on the side of a flat bed without using bedrails?: Total Help needed moving to and from a bed to a chair (including a wheelchair)?: Total Help needed standing up from a chair using your arms (e.g., wheelchair or bedside chair)?: Total Help needed to walk in hospital room?: Total Help needed climbing 3-5 steps with a railing? : Total 6 Click Score: 7    End of Session   Activity Tolerance: Patient limited by lethargy;Patient limited by fatigue Patient left: in bed;with call bell/phone within reach;with bed alarm set;with family/visitor present Nurse Communication: Mobility status (need for air mattress and prevalon boots) PT Visit Diagnosis: Unsteadiness on feet (R26.81);History of falling (Z91.81);Muscle weakness (generalized) (M62.81)    Time: 1610-9604 PT Time Calculation (min) (ACUTE ONLY): 26 min   Charges:   PT Evaluation $PT Eval Moderate Complexity: 1 Mod   PT General Charges $$ ACUTE PT VISIT: 1 Visit         Hilton Cork, PT, DPT Secure Chat Preferred  Rehab Office [phone]   Arturo Morton Brion Aliment 10/28/2023, 12:29 PM

## 2023-10-28 NOTE — Progress Notes (Signed)
Initial Nutrition Assessment  DOCUMENTATION CODES:   Non-severe (moderate) malnutrition in context of chronic illness  INTERVENTION:   -Continue soft diet, provide 1:1 feeding.  -Trial oral supplement Mighty Shake TID with meals, each supplement provides 330 kcals and 9 grams of protein  -MVI/Minerals-1 Tab daily due to prolonged inadequate intakes.   NUTRITION DIAGNOSIS:   Severe Malnutrition related to chronic illness, post-op healing, acute illness, poor appetite as evidenced by severe muscle depletion, moderate muscle depletion, moderate fat depletion, per patient/family report, energy intake < or equal to 50% for > or equal to 1 month.    GOAL:   Patient will meet greater than or equal to 90% of their needs    MONITOR:   PO intake, Weight trends, Supplement acceptance, Skin, Labs  REASON FOR ASSESSMENT:   Consult Assessment of nutrition requirement/status  ASSESSMENT: 89 y/o female presented with complaint of diarrhea. Recent hospitalization after a fall  and found to have left intertrochanteric and left humeral epicondylar fractures. She had cephalomedullary nailing of left intertrochanteric femoral fracture 09/26/23.  PMH: HLD, CHF, CAD, hypothyroidism, COPD, anxiety, GERD, cellulitis, COVID-19, MD, stroke, urosepsis, smoking hx.  Nutrition hx provided by daughter at bedside. States she has not been eating well since  Per EMR weight hx, no weight loss identified past four years although current weight loss likely masked by fluid. The last time she ate was Wednesday, consumed half portion of grits and half of slice of toast. She has been eating poorly since last hospitalization which has been one month. Patient has dislike of Ensure, she would like her to try [employer].  Dentures to be brought in. Edema 2+ RLE, LLE.  Medications reviewed and include synthroid, PPI, Florastor.  Labs reviewed   NUTRITION - FOCUSED PHYSICAL EXAM:  Flowsheet Row Most Recent  Value  Orbital Region Moderate depletion  Upper Arm Region Moderate depletion  Thoracic and Lumbar Region Moderate depletion  Buccal Region Moderate depletion  Temple Region Severe depletion  Clavicle Bone Region Severe depletion  Clavicle and Acromion Bone Region Severe depletion  Scapular Bone Region Unable to assess  Dorsal Hand Moderate depletion  Patellar Region Moderate depletion  Anterior Thigh Region Moderate depletion  Posterior Calf Region Severe depletion  Edema (RD Assessment) Moderate  Hair Reviewed  Eyes Reviewed  [pallor eyelids]  Mouth Reviewed  Skin Reviewed  Nails Reviewed       Diet Order:   Diet Order             DIET SOFT Fluid consistency: Thin  Diet effective now                   EDUCATION NEEDS:   Not appropriate for education at this time  Skin:  Skin Assessment: Skin Integrity Issues: Skin Integrity Issues:: Stage III, Stage IV, Stage II Stage II: sacrum-serosanguineous drainage Stage III: elbow-serosanguineous drainage Stage IV: vertebral column-serosanguineous drainage  Last BM:  10/27/23, type 6-medium and diarrhea x 2  Height:   Ht Readings from Last 1 Encounters:  09/26/23 5\' 2"  (1.575 m)    Weight:   Wt Readings from Last 1 Encounters:  10/28/23 47.4 kg    Ideal Body Weight:  52.3 kg  BMI:  Body mass index is 19.11 kg/m.  Estimated Nutritional Needs:   Kcal:  1500-1700 kcal/day  Protein:  68-78 gm/day  Fluid:  1500-1700 mL/day    Alvino Chapel, RDLD Clinical Dietitian See AMION for contact information

## 2023-10-28 NOTE — Progress Notes (Signed)
PROGRESS NOTE    MBER GROSHANS  ZOX:[id number] DOB: 05-31-1928 DOA: 10/27/2023 PCP: Ardith Dark, MD   Brief Narrative:  89 y.o. female with medical history significant of hyperlipidemia, CHF, CAD, hypothyroidism, COPD, anxiety, GERD and recent hospitalization from 09/25/2023 to 10/01/2023 for left femoral intertrochanteric and left humeral epicondylar fractures treated with cephalomedullary nailing of left intertrochanteric femoral fracture by Dr. Jena Gauss on 09/26/2023 and postoperative course complicated by acute blood loss and acute kidney injury requiring 4 units packed red cells transfusion and subsequent discharge to SNF.  She presented with nausea and vomiting along with dark red blood noted in her stool.  On presentation, WBC was 33, chest x-ray showed bibasilar opacities favoring atelectasis or on differential included pneumonia.  CT of abdomen and pelvis showed findings consistent with enteritis.  She was started on IV fluids and antibiotics.  Stool studies were ordered.  Assessment & Plan:   Enteritis with rectal bleeding and diarrhea -Presented with nausea, vomiting along with diarrhea and some rectal bleeding.  Hemoglobin 13.6 on presentation and repeat was 14.6.  Hemoglobin pending this morning. -Stool studies are also pending.  CT imaging as above. -Continue Rocephin and Flagyl.  Monitor H&H.  Nausea and vomiting -Possibly from enteritis.  Continue IV Protonix.  Advance diet as tolerated  Leukocytosis -Labs pending today.  Monitor  Essential hypertension -Continue metoprolol.  Monitor blood pressure  Hyperlipidemia -Continue statin  COPD -Stable.  Recent history of left femoral intertrochanteric fracture and left humeral epicondylar fracture due to fall at home - underwent surgical management for the left femoral intertrochanteric fracture with Dr. Jena Gauss on 10/28, but nonoperative management was recommended for the humeral fracture.  Since the fracture patient had not  been very mobile -PT/OT to eval and treat  History of unspecified stroke -Aspirin on hold because of rectal bleeding  History of NSVT -Continue metoprolol  Hypothyroidism -Continue levothyroxine  Anxiety disorder -Ativan discontinued during last hospitalization due to increasing fall risk  Probable moderate to severe protein calorie malnutrition -Consult nutrition   DVT prophylaxis: SCDs Code Status: DNR Family Communication: Granddaughter at bedside Disposition Plan: Status is: Observation The patient will require care spanning > 2 midnights and should be moved to inpatient because: Of severity of illness.    Consultants: None  Procedures: None  Antimicrobials: Rocephin and Flagyl from 10/27/2023 onwards   Subjective: Patient seen and examined at bedside.  Wakes up slightly, answers some questions.  Feels slightly better.  Denies any current abdominal pain.  Feels intermittently nauseous.  Objective: Vitals:   10/27/23 2006 10/28/23 0457 10/28/23 0515 10/28/23 0755  BP: 139/68 120/71  (!) 147/62  Pulse: 65 76  68  Resp: 18 16  17   Temp: 97.8 F (36.6 C) 97.9 F (36.6 C)  98.3 F (36.8 C)  TempSrc: Oral Oral  Oral  SpO2: 97% 97%  98%  Weight:   47.4 kg     Intake/Output Summary (Last 24 hours) at 10/28/2023 0939 Last data filed at 10/28/2023 0500 Gross per 24 hour  Intake 53.04 ml  Output --  Net 53.04 ml   Filed Weights   10/28/23 0515  Weight: 47.4 kg    Examination:  General exam: Appears calm and comfortable.  On room air.  Elderly female lying in bed.  Looks chronically ill and deconditioned.  Extremities any. Respiratory system: Bilateral decreased breath sounds at bases Cardiovascular system: S1 & S2 heard, Rate controlled Gastrointestinal system: Abdomen is nondistended, soft and nontender. Normal bowel sounds heard.  Extremities: No cyanosis, clubbing, edema  Central nervous system: Wakes up slightly, extremely slow to respond.  Poor  historian.  No focal neurological deficits. Moving extremities Skin: No rashes, lesions or ulcers Psychiatry: Flat affect.  Not agitated.    Data Reviewed: I have personally reviewed following labs and imaging studies  CBC: Recent Labs  Lab 10/27/23 1348 10/27/23 1748  WBC 33.0*  --   NEUTROABS 30.7*  --   HGB 13.6 14.6  HCT 42.7 45.1  MCV 98.2  --   PLT 311  --    Basic Metabolic Panel: Recent Labs  Lab 10/27/23 1348 10/28/23 0505  NA 140 140  K 3.8 4.7  CL 110 107  CO2 19* 23  GLUCOSE 110* 79  BUN 29* 33*  CREATININE 0.72 0.80  CALCIUM 7.5* 8.1*   GFR: Estimated Creatinine Clearance: 32.9 mL/min (by C-G formula based on SCr of 0.8 mg/dL). Liver Function Tests: Recent Labs  Lab 10/27/23 1348 10/28/23 0505  AST 41 33  ALT 10 6  ALKPHOS 128* 101  BILITOT 1.5* 1.2*  PROT 5.4* 4.5*  ALBUMIN 2.0* 1.8*   No results for input(s): "LIPASE", "AMYLASE" in the last 168 hours. No results for input(s): "AMMONIA" in the last 168 hours. Coagulation Profile: Recent Labs  Lab 10/27/23 1329  INR 1.1   Cardiac Enzymes: No results for input(s): "CKTOTAL", "CKMB", "CKMBINDEX", "TROPONINI" in the last 168 hours. BNP (last 3 results) No results for input(s): "PROBNP" in the last 8760 hours. HbA1C: No results for input(s): "HGBA1C" in the last 72 hours. CBG: No results for input(s): "GLUCAP" in the last 168 hours. Lipid Profile: No results for input(s): "CHOL", "HDL", "LDLCALC", "TRIG", "CHOLHDL", "LDLDIRECT" in the last 72 hours. Thyroid Function Tests: Recent Labs    10/27/23 1348 10/27/23 1700  TSH 7.080*  --   FREET4  --  1.40*   Anemia Panel: No results for input(s): "VITAMINB12", "FOLATE", "FERRITIN", "TIBC", "IRON", "RETICCTPCT" in the last 72 hours. Sepsis Labs: Recent Labs  Lab 10/27/23 1700 10/27/23 1748  PROCALCITON 0.10  --   LATICACIDVEN  --  1.9    No results found for this or any previous visit (from the past 240 hour(s)).        Radiology Studies: CT ABDOMEN PELVIS W CONTRAST  Result Date: 10/27/2023 CLINICAL DATA:  Acute non localized abdominal pain. EXAM: CT ABDOMEN AND PELVIS WITH CONTRAST TECHNIQUE: Multidetector CT imaging of the abdomen and pelvis was performed using the standard protocol following bolus administration of intravenous contrast. RADIATION DOSE REDUCTION: This exam was performed according to the departmental dose-optimization program which includes automated exposure control, adjustment of the mA and/or kV according to patient size and/or use of iterative reconstruction technique. CONTRAST:  75mL OMNIPAQUE IOHEXOL 350 MG/ML SOLN COMPARISON:  Noncontrast CT on 09/25/2023 FINDINGS: Lower Chest: New small bilateral pleural effusions and dependent atelectasis. Hepatobiliary: No suspicious hepatic masses identified. Gallbladder is unremarkable. No evidence of biliary ductal dilatation. Pancreas:  No mass or inflammatory changes. Spleen: Within normal limits in size and appearance. Adrenals/Urinary Tract: No suspicious masses identified. No evidence of ureteral calculi or hydronephrosis. Stomach/Bowel: Moderate wall thickening is seen involving multiple distal small bowel loops in the pelvis. No No evidence bowel obstruction Diverticulosis is seen mainly involving the sigmoid colon, however there is no evidence of diverticulitis. Diffuse mesenteric and body wall edema also noted. Vascular/Lymphatic: No pathologically enlarged lymph nodes. No acute vascular findings. Reproductive:  No mass or other significant abnormality. Other:  None. Musculoskeletal: No  suspicious bone lesions identified. Healing insufficiency fracture is seen involving the left sacral ala. Several healing, mildly displaced fractures are seen involving the left superior and inferior pubic rami. Internal fixation hardware noted in the left hip. Old T11 vertebroplasty also noted. IMPRESSION: Moderate wall thickening involving multiple distal  small bowel loops in the pelvis, consistent with enteritis. Diffuse mesenteric and body wall edema. New small bilateral pleural effusions and dependent atelectasis. Healing fractures involving the left sacral ala and left pubic rami. Electronically Signed   By: Danae Orleans M.D.   On: 10/27/2023 15:08   DG Chest Portable 1 View  Result Date: 10/27/2023 CLINICAL DATA:  Altered mental status. EXAM: PORTABLE CHEST 1 VIEW COMPARISON:  09/25/2023 and older studies. FINDINGS: Cardiac silhouette is normal in size. No mediastinal or hilar masses. Lungs are hyperexpanded. Bilateral interstitial thickening, similar to the prior exam. Additional opacity noted at the lung bases suspected to be atelectasis. Consider pneumonia in the proper clinical setting. No convincing pleural effusion.  No pneumothorax. Skeletal structures are demineralized, grossly intact. IMPRESSION: 1. Chronic interstitial thickening.  Stable lung hyperexpansion. 2. Bibasilar opacity, favor atelectasis. Consider pneumonia in the proper clinical setting. Electronically Signed   By: Amie Portland M.D.   On: 10/27/2023 12:47        Scheduled Meds:  atorvastatin  40 mg Oral Daily   metoprolol tartrate  12.5 mg Oral BID   pantoprazole (PROTONIX) IV  40 mg Intravenous Q12H   QUEtiapine  12.5 mg Oral QHS   saccharomyces boulardii  250 mg Oral BID   sodium chloride flush  3 mL Intravenous Q12H   Continuous Infusions:  sodium chloride     cefTRIAXone (ROCEPHIN)  IV     metronidazole 100 mL/hr at 10/28/23 0500          Glade Lloyd, MD [facility] 10/28/2023, 9:39 AM

## 2023-10-28 NOTE — Plan of Care (Signed)
  Problem: Clinical Measurements: Goal: Ability to maintain clinical measurements within normal limits will improve Outcome: Progressing   Problem: Coping: Goal: Level of anxiety will decrease Outcome: Progressing   Problem: Pain Management: Goal: General experience of comfort will improve Outcome: Progressing

## 2023-10-28 NOTE — Social Work (Signed)
CSW reviewed chart and noted that pt admitted from [facility]. Per PT/OT documentation, family is reporting that pt will be transitioning to comfort care at discharge. Per [facility] note from 11/19 case manager was advised that a LTC bed had been offered at [facility]. CSW spoke with Kennith Center at [facility] who confirmed pt was there for Short term rehab, but the family had been adamant they did not want a LTC bed as they wanted to take her home. CSW notified medical team to this and requested Palliative to be brought on board for clarity of goals of care. Facility unsure if they will still be able to offer LTC bed if they want it, and unsure if family knows that Medicare will not pay for it. TOC to continue to follow for disposition and Palliative input.

## 2023-10-29 DIAGNOSIS — K529 Noninfective gastroenteritis and colitis, unspecified: Secondary | ICD-10-CM | POA: Diagnosis not present

## 2023-10-29 LAB — CBC WITH DIFFERENTIAL/PLATELET
Abs Immature Granulocytes: 0.17 10*3/uL — ABNORMAL HIGH (ref 0.00–0.07)
Basophils Absolute: 0.1 10*3/uL (ref 0.0–0.1)
Basophils Relative: 0 %
Eosinophils Absolute: 0 10*3/uL (ref 0.0–0.5)
Eosinophils Relative: 0 %
HCT: 41 % (ref 36.0–46.0)
Hemoglobin: 12.7 g/dL (ref 12.0–15.0)
Immature Granulocytes: 1 %
Lymphocytes Relative: 4 %
Lymphs Abs: 1 10*3/uL (ref 0.7–4.0)
MCH: 30.8 pg (ref 26.0–34.0)
MCHC: 31 g/dL (ref 30.0–36.0)
MCV: 99.3 fL (ref 80.0–100.0)
Monocytes Absolute: 1.6 10*3/uL — ABNORMAL HIGH (ref 0.1–1.0)
Monocytes Relative: 7 %
Neutro Abs: 19.8 10*3/uL — ABNORMAL HIGH (ref 1.7–7.7)
Neutrophils Relative %: 88 %
Platelets: 299 10*3/uL (ref 150–400)
RBC: 4.13 MIL/uL (ref 3.87–5.11)
RDW: 21.6 % — ABNORMAL HIGH (ref 11.5–15.5)
WBC: 22.6 10*3/uL — ABNORMAL HIGH (ref 4.0–10.5)
nRBC: 0 % (ref 0.0–0.2)

## 2023-10-29 LAB — COMPREHENSIVE METABOLIC PANEL
ALT: 8 U/L (ref 0–44)
AST: 27 U/L (ref 15–41)
Albumin: 2 g/dL — ABNORMAL LOW (ref 3.5–5.0)
Alkaline Phosphatase: 136 U/L — ABNORMAL HIGH (ref 38–126)
Anion gap: 14 (ref 5–15)
BUN: 30 mg/dL — ABNORMAL HIGH (ref 8–23)
CO2: 19 mmol/L — ABNORMAL LOW (ref 22–32)
Calcium: 8.3 mg/dL — ABNORMAL LOW (ref 8.9–10.3)
Chloride: 106 mmol/L (ref 98–111)
Creatinine, Ser: 1.06 mg/dL — ABNORMAL HIGH (ref 0.44–1.00)
GFR, Estimated: 49 mL/min — ABNORMAL LOW (ref >60)
Glucose, Bld: 81 mg/dL (ref 70–99)
Potassium: 3.8 mmol/L (ref 3.5–5.1)
Sodium: 139 mmol/L (ref 135–145)
Total Bilirubin: 1.3 mg/dL — ABNORMAL HIGH (ref <1.2)
Total Protein: 5.7 g/dL — ABNORMAL LOW (ref 6.5–8.1)

## 2023-10-29 LAB — MAGNESIUM: Magnesium: 1.7 mg/dL (ref 1.7–2.4)

## 2023-10-29 NOTE — Progress Notes (Signed)
PROGRESS NOTE    Holly [name]  ZOX:[id number] DOB: 03/23/1928 DOA: 10/27/2023 PCP: Ardith Dark, MD   Brief Narrative:  89 y.o. female with medical history significant of hyperlipidemia, CHF, CAD, hypothyroidism, COPD, anxiety, GERD and recent hospitalization from 09/25/2023 to 10/01/2023 for left femoral intertrochanteric and left humeral epicondylar fractures treated with cephalomedullary nailing of left intertrochanteric femoral fracture by Dr. Jena Gauss on 09/26/2023 and postoperative course complicated by acute blood loss and acute kidney injury requiring 4 units packed red cells transfusion and subsequent discharge to SNF.  She presented with nausea and vomiting along with dark red blood noted in her stool.  On presentation, WBC was 33, chest x-ray showed bibasilar opacities favoring atelectasis or on differential included pneumonia.  CT of abdomen and pelvis showed findings consistent with enteritis.  She was started on IV fluids and antibiotics.  Stool studies were ordered.  Assessment & Plan:   Enteritis with rectal bleeding and diarrhea -Presented with nausea, vomiting along with diarrhea and some rectal bleeding.  Hemoglobin 13.6 on presentation and repeat was 14.6.  Hemoglobin currently stable.  Monitor H&H. -Stool studies are still pending.  CT imaging as above. -Continue Rocephin and Flagyl.  Monitor H&H.  Nausea and vomiting -Possibly from enteritis.  Continue IV Protonix.  Advance diet as tolerated  Leukocytosis -WBCs 22.6 today.  Monitor  Essential hypertension -Continue metoprolol.  Monitor blood pressure  Hyperlipidemia -Continue statin  COPD -Stable.  Recent history of left femoral intertrochanteric fracture and left humeral epicondylar fracture due to fall at home - underwent surgical management for the left femoral intertrochanteric fracture with Dr. Jena Gauss on 10/28, but nonoperative management was recommended for the humeral fracture.  Since the fracture patient  had not been very mobile -PT/OT following.  Patient will need SNF/long-term care TOC following  History of unspecified stroke -Aspirin on hold because of rectal bleeding  History of NSVT -Continue metoprolol  Hypothyroidism -Continue levothyroxine  Anxiety disorder -Ativan discontinued during last hospitalization due to increasing fall risk  Probable moderate to severe protein calorie malnutrition -Consult nutrition  Goals of care -Overall prognosis is guarded to poor.  Palliative care consulted for goals of care discussion.  DVT prophylaxis: SCDs Code Status: DNR Family Communication: Son, daughter and other family member at bedside Disposition Plan: Status is: inpatient because: Of severity of illness.    Consultants: Palliative care  Procedures: None  Antimicrobials: Rocephin and Flagyl from 10/27/2023 onwards   Subjective: Patient seen and examined at bedside.  Poor historian.  No fever, seizures, vomiting reported.  Denies any worsening abdominal pain.  Objective: Vitals:   10/28/23 1737 10/28/23 2054 10/29/23 0524 10/29/23 0805  BP: 132/75 (!) 141/58 137/72 115/61  Pulse: 63 68 71 78  Resp: 18 18 17 16   Temp: 97.7 F (36.5 C) 98.6 F (37 C) 97.6 F (36.4 C) 97.8 F (36.6 C)  TempSrc:  Oral    SpO2: 95% 96% 97% 96%  Weight:        Intake/Output Summary (Last 24 hours) at 10/29/2023 0815 Last data filed at 10/28/2023 1930 Gross per 24 hour  Intake 296.89 ml  Output --  Net 296.89 ml   Filed Weights   10/28/23 0515  Weight: 47.4 kg    Examination:  General: Currently on room air.  No distress.  Chronically ill and deconditioned looking. ENT/neck: No thyromegaly.  JVD is not elevated  respiratory: Decreased breath sounds at bases bilaterally with some crackles; no wheezing  CVS: S1-S2 heard, rate controlled  currently Abdominal: Soft, nontender, slightly distended; no organomegaly, bowel sounds are heard Extremities: Trace lower extremity  edema; no cyanosis  CNS: Very slow to respond.  Poor historian.  No focal neurologic deficit.  Moves extremities Lymph: No obvious lymphadenopathy Skin: No obvious ecchymosis/lesions  psych: Extremely flat affect.  Currently not agitated. musculoskeletal: No obvious joint swelling/deformity     Data Reviewed: I have personally reviewed following labs and imaging studies  CBC: Recent Labs  Lab 10/27/23 1348 10/27/23 1748 10/28/23 1041 10/29/23 0240  WBC 33.0*  --  28.2* 22.6*  NEUTROABS 30.7*  --   --  19.8*  HGB 13.6 14.6 12.6 12.7  HCT 42.7 45.1 39.3 41.0  MCV 98.2  --  96.6 99.3  PLT 311  --  294 299   Basic Metabolic Panel: Recent Labs  Lab 10/27/23 1348 10/28/23 0505 10/29/23 0553  NA 140 140 139  K 3.8 4.7 3.8  CL 110 107 106  CO2 19* 23 19*  GLUCOSE 110* 79 81  BUN 29* 33* 30*  CREATININE 0.72 0.80 1.06*  CALCIUM 7.5* 8.1* 8.3*  MG  --   --  1.7   GFR: Estimated Creatinine Clearance: 24.8 mL/min (A) (by C-G formula based on SCr of 1.06 mg/dL (H)). Liver Function Tests: Recent Labs  Lab 10/27/23 1348 10/28/23 0505 10/29/23 0553  AST 41 33 27  ALT 10 6 8   ALKPHOS 128* 101 136*  BILITOT 1.5* 1.2* 1.3*  PROT 5.4* 4.5* 5.7*  ALBUMIN 2.0* 1.8* 2.0*   No results for input(s): "LIPASE", "AMYLASE" in the last 168 hours. No results for input(s): "AMMONIA" in the last 168 hours. Coagulation Profile: Recent Labs  Lab 10/27/23 1329  INR 1.1   Cardiac Enzymes: No results for input(s): "CKTOTAL", "CKMB", "CKMBINDEX", "TROPONINI" in the last 168 hours. BNP (last 3 results) No results for input(s): "PROBNP" in the last 8760 hours. HbA1C: No results for input(s): "HGBA1C" in the last 72 hours. CBG: No results for input(s): "GLUCAP" in the last 168 hours. Lipid Profile: No results for input(s): "CHOL", "HDL", "LDLCALC", "TRIG", "CHOLHDL", "LDLDIRECT" in the last 72 hours. Thyroid Function Tests: Recent Labs    10/27/23 1348 10/27/23 1700  TSH 7.080*   --   FREET4  --  1.40*   Anemia Panel: No results for input(s): "VITAMINB12", "FOLATE", "FERRITIN", "TIBC", "IRON", "RETICCTPCT" in the last 72 hours. Sepsis Labs: Recent Labs  Lab 10/27/23 1700 10/27/23 1748  PROCALCITON 0.10  --   LATICACIDVEN  --  1.9    Recent Results (from the past 240 hour(s))  Culture, blood (single) w Reflex to ID Panel     Status: None (Preliminary result)   Collection Time: 10/27/23  5:48 PM   Specimen: BLOOD RIGHT ARM  Result Value Ref Range Status   Specimen Description BLOOD RIGHT ARM  Final   Special Requests   Final    AEROBIC BOTTLE ONLY Blood Culture results may not be optimal due to an inadequate volume of blood received in culture bottles   Culture   Final    NO GROWTH 2 DAYS Performed at [facility], [address] [place], [place] [address]    Report Status PENDING  Incomplete         Radiology Studies: CT ABDOMEN PELVIS W CONTRAST  Result Date: 10/27/2023 CLINICAL DATA:  Acute non localized abdominal pain. EXAM: CT ABDOMEN AND PELVIS WITH CONTRAST TECHNIQUE: Multidetector CT imaging of the abdomen and pelvis was performed using the standard protocol  following bolus administration of intravenous contrast. RADIATION DOSE REDUCTION: This exam was performed according to the departmental dose-optimization program which includes automated exposure control, adjustment of the mA and/or kV according to patient size and/or use of iterative reconstruction technique. CONTRAST:  75mL OMNIPAQUE IOHEXOL 350 MG/ML SOLN COMPARISON:  Noncontrast CT on 09/25/2023 FINDINGS: Lower Chest: New small bilateral pleural effusions and dependent atelectasis. Hepatobiliary: No suspicious hepatic masses identified. Gallbladder is unremarkable. No evidence of biliary ductal dilatation. Pancreas:  No mass or inflammatory changes. Spleen: Within normal limits in size and appearance. Adrenals/Urinary Tract: No suspicious masses identified. No evidence of ureteral  calculi or hydronephrosis. Stomach/Bowel: Moderate wall thickening is seen involving multiple distal small bowel loops in the pelvis. No No evidence bowel obstruction Diverticulosis is seen mainly involving the sigmoid colon, however there is no evidence of diverticulitis. Diffuse mesenteric and body wall edema also noted. Vascular/Lymphatic: No pathologically enlarged lymph nodes. No acute vascular findings. Reproductive:  No mass or other significant abnormality. Other:  None. Musculoskeletal: No suspicious bone lesions identified. Healing insufficiency fracture is seen involving the left sacral ala. Several healing, mildly displaced fractures are seen involving the left superior and inferior pubic rami. Internal fixation hardware noted in the left hip. Old T11 vertebroplasty also noted. IMPRESSION: Moderate wall thickening involving multiple distal small bowel loops in the pelvis, consistent with enteritis. Diffuse mesenteric and body wall edema. New small bilateral pleural effusions and dependent atelectasis. Healing fractures involving the left sacral ala and left pubic rami. Electronically Signed   By: Danae Orleans M.D.   On: 10/27/2023 15:08   DG Chest Portable 1 View  Result Date: 10/27/2023 CLINICAL DATA:  Altered mental status. EXAM: PORTABLE CHEST 1 VIEW COMPARISON:  09/25/2023 and older studies. FINDINGS: Cardiac silhouette is normal in size. No mediastinal or hilar masses. Lungs are hyperexpanded. Bilateral interstitial thickening, similar to the prior exam. Additional opacity noted at the lung bases suspected to be atelectasis. Consider pneumonia in the proper clinical setting. No convincing pleural effusion.  No pneumothorax. Skeletal structures are demineralized, grossly intact. IMPRESSION: 1. Chronic interstitial thickening.  Stable lung hyperexpansion. 2. Bibasilar opacity, favor atelectasis. Consider pneumonia in the proper clinical setting. Electronically Signed   By: Amie Portland M.D.    On: 10/27/2023 12:47        Scheduled Meds:  atorvastatin  40 mg Oral Daily   levothyroxine  150 mcg Oral QAC breakfast   metoprolol tartrate  12.5 mg Oral BID   mometasone-formoterol  2 puff Inhalation BID   multivitamin with minerals  1 tablet Oral Daily   pantoprazole (PROTONIX) IV  40 mg Intravenous Q12H   QUEtiapine  12.5 mg Oral QHS   saccharomyces boulardii  250 mg Oral BID   sodium chloride flush  3 mL Intravenous Q12H   Continuous Infusions:  sodium chloride     cefTRIAXone (ROCEPHIN)  IV Stopped (10/28/23 2000)   metronidazole 500 mg (10/29/23 0420)          Glade Lloyd, MD [facility] 10/29/2023, 8:15 AM

## 2023-10-29 NOTE — Plan of Care (Signed)
  Problem: Coping: Goal: Level of anxiety will decrease Outcome: Progressing   Problem: Pain Management: Goal: General experience of comfort will improve Outcome: Progressing

## 2023-10-30 DIAGNOSIS — Z515 Encounter for palliative care: Secondary | ICD-10-CM | POA: Diagnosis not present

## 2023-10-30 DIAGNOSIS — Z7189 Other specified counseling: Secondary | ICD-10-CM

## 2023-10-30 DIAGNOSIS — K529 Noninfective gastroenteritis and colitis, unspecified: Secondary | ICD-10-CM | POA: Diagnosis not present

## 2023-10-30 LAB — CBC WITH DIFFERENTIAL/PLATELET
Abs Immature Granulocytes: 0.04 10*3/uL (ref 0.00–0.07)
Basophils Absolute: 0.1 10*3/uL (ref 0.0–0.1)
Basophils Relative: 0 %
Eosinophils Absolute: 0.2 10*3/uL (ref 0.0–0.5)
Eosinophils Relative: 1 %
HCT: 36.5 % (ref 36.0–46.0)
Hemoglobin: 11.7 g/dL — ABNORMAL LOW (ref 12.0–15.0)
Immature Granulocytes: 0 %
Lymphocytes Relative: 8 %
Lymphs Abs: 1.4 10*3/uL (ref 0.7–4.0)
MCH: 31.1 pg (ref 26.0–34.0)
MCHC: 32.1 g/dL (ref 30.0–36.0)
MCV: 97.1 fL (ref 80.0–100.0)
Monocytes Absolute: 1.1 10*3/uL — ABNORMAL HIGH (ref 0.1–1.0)
Monocytes Relative: 6 %
Neutro Abs: 14.3 10*3/uL — ABNORMAL HIGH (ref 1.7–7.7)
Neutrophils Relative %: 85 %
Platelets: 271 10*3/uL (ref 150–400)
RBC: 3.76 MIL/uL — ABNORMAL LOW (ref 3.87–5.11)
RDW: 21.8 % — ABNORMAL HIGH (ref 11.5–15.5)
Smear Review: NORMAL
WBC: 17 10*3/uL — ABNORMAL HIGH (ref 4.0–10.5)
nRBC: 0 % (ref 0.0–0.2)

## 2023-10-30 LAB — BASIC METABOLIC PANEL
Anion gap: 11 (ref 5–15)
BUN: 31 mg/dL — ABNORMAL HIGH (ref 8–23)
CO2: 22 mmol/L (ref 22–32)
Calcium: 8.4 mg/dL — ABNORMAL LOW (ref 8.9–10.3)
Chloride: 106 mmol/L (ref 98–111)
Creatinine, Ser: 1.35 mg/dL — ABNORMAL HIGH (ref 0.44–1.00)
GFR, Estimated: 37 mL/min — ABNORMAL LOW (ref >60)
Glucose, Bld: 72 mg/dL (ref 70–99)
Potassium: 3.6 mmol/L (ref 3.5–5.1)
Sodium: 139 mmol/L (ref 135–145)

## 2023-10-30 LAB — MAGNESIUM: Magnesium: 1.9 mg/dL (ref 1.7–2.4)

## 2023-10-30 MED ORDER — CEFDINIR 300 MG PO CAPS: 300.0000 mg | ORAL_CAPSULE | Freq: Two times a day (BID) | ORAL | Status: DC

## 2023-10-30 MED ORDER — CEFDINIR 300 MG PO CAPS
300.0000 mg | ORAL_CAPSULE | ORAL | Status: DC
  Administered 2023-10-30: 300 mg via ORAL
  Filled 2023-10-30: qty 1

## 2023-10-30 MED ORDER — METRONIDAZOLE 500 MG PO TABS
500.0000 mg | ORAL_TABLET | Freq: Two times a day (BID) | ORAL | Status: DC
  Administered 2023-10-30 – 2023-10-31 (×2): 500 mg via ORAL
  Filled 2023-10-30 (×2): qty 1

## 2023-10-30 NOTE — Plan of Care (Signed)
Attempted to educate patient concerning comfort care measures

## 2023-10-30 NOTE — Care Plan (Signed)
Patient does not have a IV at the change of shift and the day nurse handed over that the MD is aware of this

## 2023-10-30 NOTE — Consult Note (Signed)
Palliative Medicine Inpatient Consult Note  Consulting Provider: Glade Lloyd, MD   Reason for consult:   Palliative Care Consult Services Palliative Medicine Consult  Reason for Consult? Goals of care   10/30/2023  HPI:  Per intake H&P --> 89 y.o. female with medical history significant of hyperlipidemia, CHF, CAD, hypothyroidism, COPD, anxiety, GERD and recent hospitalization from 09/25/2023 to 10/01/2023 for left femoral intertrochanteric and left humeral epicondylar fractures treated with cephalomedullary nailing of left intertrochanteric femoral fracture by Dr. Jena Gauss on 09/26/2023 and postoperative course complicated by acute blood loss and acute kidney injury requiring 4 units packed red cells transfusion and subsequent discharge to SNF.  She presented with nausea and vomiting along with dark red blood noted in her stool.  On presentation, WBC was 33, chest x-ray showed bibasilar opacities favoring atelectasis or on differential included pneumonia.  CT of abdomen and pelvis showed findings consistent with enteritis.  She was started on IV fluids and antibiotics.  Palliative care asked to get involved to further support goals of care conversations.   Clinical Assessment/Goals of Care:  *Please note that this is a verbal dictation therefore any spelling or grammatical errors are due to the "Dragon Medical One" system interpretation.  I have reviewed medical records including EPIC notes, labs and imaging, received report from bedside RN, assessed the patient who is lying in bed in NAD.    I met with patients daughter, Storm Frisk to further discuss diagnosis prognosis, GOC, EOL wishes, disposition and options.   I introduced Palliative Medicine as specialized medical care for people living with serious illness. It focuses on providing relief from the symptoms and stress of a serious illness. The goal is to improve quality of life for both the patient and the family.  Medical History  Review and Understanding:  A review of patient's past medical history significant for hyperlipidemia, congestive heart failure, coronary artery disease, COPD, GERD, hypothyroidism, anxiety, and recent left femoral fracture was held.  Social History:  mL from [facility].  She is widowed.  She has 4 children.  She formerly worked for [employer].  She enjoys spending time with her family and being social.  She is a woman of the [employer].  Functional and Nutritional State:  Preceding patient's fall in October she was fairly functional able to participate in B ADLs.  She was also noted to have a good appetite at that time.  However since then she has become more dependent on others for all of her B ADLs and her appetite has become diminutive  Advance Directives:  A detailed discussion was had today regarding advanced directives. Patients has a granddaughter, Marylene Land who identified as her HVPOA.  Patient also has 4 children that are working jointly to make decisions in patient's best interest.  Code Status:  Concepts specific to code status, artifical feeding and hydration, continued IV antibiotics and rehospitalization was had.  The difference between a aggressive medical intervention path  and a palliative comfort care path for this patient at this time was had.   Skylaar is an established DO NOT RESUSCITATE CODE STATUS.  Provided  "Gone From My Site" booklet.  Discussion:  We reviewed patients clinical condition in the setting of her recent hip fracture. He daughter shares the shock of it all given how well functioning Rilee was prior to her fall. I offered time and space for her to express her grief.   We discussed that Destinny has taken a complete turn since  her operation. She had been at skilled nursing and has decline in terms of her mobility and oral intake. We reviewed that the hip fracture itself with patients who have advanced age and co-morbidities can carry  increase mortality risk. We reviewed that anesthesia in addition can cause an array of post operative complications. Susie shares that her mother seems to be enduring all of the negative effects of the surgery.  We discussed adult FTT and how it is often a precursor to end of life. Susie shares that Emme is seeing and speaking to family members who have already passed. We discussed that this too happens as we are getting closer to our own mortality.   We reviewed options from here inclusive of comfort care. We talked about transition to comfort measures in house and what that would entail inclusive of medications to control pain, dyspnea, agitation, nausea, itching, and hiccups.  We discussed stopping all uneccessary measures such as cardiac monitoring, blood draws, needle sticks, and frequent vital signs.   Susie shares that she believes she needs time to speak to her other family members, she anticipates this will likely occur early this week.   Utilized reflective listening throughout our time together.   Provided  "Gone From My Site" booklet. ____________________________________________ Addendum:  I called patients granddaughter, Marylene Land who was aware of the above conversation. She shares that the family is thinking about meeting on Tuesday regarding further goals of care decisions.   Decision Maker: Payne,Angela (Granddaughter):[phone] (Mobile)   SUMMARY OF RECOMMENDATIONS   DNAR  Patients family determining a time to meet, likely Tuesday   Discussed openly and honestly with patients daughter that we are likely looking at the end of life for her  Ongoing PMT support  Code Status/Advance Care Planning: DNAR  Palliative Prophylaxis:  Aspiration, Bowel Regimen, Delirium Protocol, Frequent Pain Assessment, Oral Care, Palliative Wound Care, and Turn Reposition  Additional Recommendations (Limitations, Scope, Preferences): Continue current care  Psycho-social/Spiritual:   Desire for further Chaplaincy support: Not presently Additional Recommendations: Education on adult FTT   Prognosis: Limited overall.   Discharge Planning: Discharge plan is uncertain.  Vitals:   10/30/23 0538 10/30/23 0739  BP: (!) 109/52 123/61  Pulse: 98 64  Resp: 16 17  Temp: 97.9 F (36.6 C) 97.8 F (36.6 C)  SpO2: 96% 97%    Intake/Output Summary (Last 24 hours) at 10/30/2023 1016 Last data filed at 10/30/2023 7829 Gross per 24 hour  Intake 385.95 ml  Output 150 ml  Net 235.95 ml   Last Weight  Most recent update: 10/30/2023  6:41 AM    Weight  41 kg (90 lb 6.2 oz)            Gen:  Elderly Caucasian F in NAD HEENT: moist mucous membranes CV: Regular rate and rhythm  PULM: On RA, breathing is even and nonlabored ABD: soft/nontender  EXT: No edema  Neuro: Somnolent  PPS: 10%   This conversation/these recommendations were discussed with patient primary care team, Dr. Hanley Ben  Billing based on MDM: High  Problems Addressed: One acute or chronic illness or injury that poses a threat to life or bodily function  Amount and/or Complexity of Data: Category 3:Discussion of management or test interpretation with external physician/other qualified health care professional/appropriate source (not separately reported)  Risks: Decision not to resuscitate or to de-escalate care because of poor prognosis ______________________________________________________ Lamarr Lulas [facility] Palliative Medicine Team Team Cell Phone: [phone] Please utilize secure chat with additional questions, if  there is no response within 30 minutes please call the above phone number  Palliative Medicine Team providers are available by phone from 7am to 7pm daily and can be reached through the team cell phone.  Should this patient require assistance outside of these hours, please call the patient's attending physician.

## 2023-10-30 NOTE — Progress Notes (Signed)
Assessed patient's arm for placing a PIV access. Rt. Arm was very swollen due to infiltration previous PIV access on AC. Patient has small veins upper arm and stuck twice without success. Lt. Elbow was fracture according patient's daughter. Informed patient's RN and Dr. Hanley Ben regarding this matter. Recommended PICC line by IR. HS [employer]

## 2023-10-30 NOTE — Progress Notes (Signed)
PROGRESS NOTE    Holly [name]  ZHY:[id number] DOB: December 31, 1927 DOA: 10/27/2023 PCP: Ardith Dark, MD   Brief Narrative:  89 y.o. female with medical history significant of hyperlipidemia, CHF, CAD, hypothyroidism, COPD, anxiety, GERD and recent hospitalization from 09/25/2023 to 10/01/2023 for left femoral intertrochanteric and left humeral epicondylar fractures treated with cephalomedullary nailing of left intertrochanteric femoral fracture by Dr. Jena Gauss on 09/26/2023 and postoperative course complicated by acute blood loss and acute kidney injury requiring 4 units packed red cells transfusion and subsequent discharge to SNF.  She presented with nausea and vomiting along with dark red blood noted in her stool.  On presentation, WBC was 33, chest x-ray showed bibasilar opacities favoring atelectasis or on differential included pneumonia.  CT of abdomen and pelvis showed findings consistent with enteritis.  She was started on IV fluids and antibiotics.  Stool studies were ordered.  Assessment & Plan:   Enteritis with rectal bleeding and diarrhea -Presented with nausea, vomiting along with diarrhea and some rectal bleeding.  Hemoglobin 13.6 on presentation and repeat was 14.6.  Hemoglobin currently stable.  Monitor H&H. -Stool studies are still pending.  CT imaging as above. -Continue Rocephin and Flagyl.  Monitor H&H.  Nausea and vomiting -Possibly from enteritis.  Continue IV Protonix.  Advance diet as tolerated  Leukocytosis -WBCs 22.6 on 10/29/2023.  Labs pending today.  Monitor  Essential hypertension -Continue metoprolol.  Monitor blood pressure  Hyperlipidemia -Continue statin  COPD -Stable.  Recent history of left femoral intertrochanteric fracture and left humeral epicondylar fracture due to fall at home - underwent surgical management for the left femoral intertrochanteric fracture with Dr. Jena Gauss on 10/28, but nonoperative management was recommended for the humeral  fracture.  Since the fracture patient had not been very mobile -PT/OT following.  Patient will need SNF/long-term care.  TOC following  History of unspecified stroke -Aspirin on hold because of rectal bleeding  History of NSVT -Continue metoprolol  Hypothyroidism -Continue levothyroxine  Anxiety disorder -Ativan discontinued during last hospitalization due to increasing fall risk  Moderate protein calorie malnutrition -Follow nutrition recommendations  Goals of care -Overall prognosis is guarded to poor.  Palliative care evaluation pending  DVT prophylaxis: SCDs Code Status: DNR Family Communication: Son and daughter at bedside Disposition Plan: Status is: inpatient because: Of severity of illness.    Consultants: Palliative care  Procedures: None  Antimicrobials: Rocephin and Flagyl from 10/27/2023 onwards   Subjective: Patient seen and examined at bedside.  Extremely poor historian.  No seizures, vomiting or agitation reported.  Oral intake remains poor.  Objective: Vitals:   10/29/23 1559 10/29/23 2002 10/30/23 0538 10/30/23 0739  BP: (!) 123/53 (!) 104/51 (!) 109/52 123/61  Pulse: 67 68 98 64  Resp: 16  16 17   Temp: 98.2 F (36.8 C) 98.9 F (37.2 C) 97.9 F (36.6 C) 97.8 F (36.6 C)  TempSrc:   Oral   SpO2: 100% 95% 96% 97%  Weight:   41 kg     Intake/Output Summary (Last 24 hours) at 10/30/2023 0809 Last data filed at 10/30/2023 0538 Gross per 24 hour  Intake 385.95 ml  Output 150 ml  Net 235.95 ml   Filed Weights   10/28/23 0515 10/30/23 0538  Weight: 47.4 kg 41 kg    Examination:  General: No acute distress.  On room air currently.  Chronically ill and deconditioned looking. ENT/neck: No JVD elevation or palpable neck masses noted  respiratory: Bilateral decreased breath sounds at bases with scattered crackles  CVS: Rate mostly controlled; S1 and S2 are heard Abdominal: Soft, nontender, distended mildly; no organomegaly, bowel sounds are  heard normally Extremities: No clubbing; mild lower extremity edema present  CNS: Poor historian.  Still very slow to respond.  No obvious focal deficits noted lymph: No obvious palpable lymphadenopathy Skin: No obvious petechiae/rashes psych: Mostly flat affect.  Showing no signs of agitation  musculoskeletal: No obvious joint tenderness/erythema    Data Reviewed: I have personally reviewed following labs and imaging studies  CBC: Recent Labs  Lab 10/27/23 1348 10/27/23 1748 10/28/23 1041 10/29/23 0240  WBC 33.0*  --  28.2* 22.6*  NEUTROABS 30.7*  --   --  19.8*  HGB 13.6 14.6 12.6 12.7  HCT 42.7 45.1 39.3 41.0  MCV 98.2  --  96.6 99.3  PLT 311  --  294 299   Basic Metabolic Panel: Recent Labs  Lab 10/27/23 1348 10/28/23 0505 10/29/23 0553  NA 140 140 139  K 3.8 4.7 3.8  CL 110 107 106  CO2 19* 23 19*  GLUCOSE 110* 79 81  BUN 29* 33* 30*  CREATININE 0.72 0.80 1.06*  CALCIUM 7.5* 8.1* 8.3*  MG  --   --  1.7   GFR: Estimated Creatinine Clearance: 21.5 mL/min (A) (by C-G formula based on SCr of 1.06 mg/dL (H)). Liver Function Tests: Recent Labs  Lab 10/27/23 1348 10/28/23 0505 10/29/23 0553  AST 41 33 27  ALT 10 6 8   ALKPHOS 128* 101 136*  BILITOT 1.5* 1.2* 1.3*  PROT 5.4* 4.5* 5.7*  ALBUMIN 2.0* 1.8* 2.0*   No results for input(s): "LIPASE", "AMYLASE" in the last 168 hours. No results for input(s): "AMMONIA" in the last 168 hours. Coagulation Profile: Recent Labs  Lab 10/27/23 1329  INR 1.1   Cardiac Enzymes: No results for input(s): "CKTOTAL", "CKMB", "CKMBINDEX", "TROPONINI" in the last 168 hours. BNP (last 3 results) No results for input(s): "PROBNP" in the last 8760 hours. HbA1C: No results for input(s): "HGBA1C" in the last 72 hours. CBG: No results for input(s): "GLUCAP" in the last 168 hours. Lipid Profile: No results for input(s): "CHOL", "HDL", "LDLCALC", "TRIG", "CHOLHDL", "LDLDIRECT" in the last 72 hours. Thyroid Function  Tests: Recent Labs    10/27/23 1348 10/27/23 1700  TSH 7.080*  --   FREET4  --  1.40*   Anemia Panel: No results for input(s): "VITAMINB12", "FOLATE", "FERRITIN", "TIBC", "IRON", "RETICCTPCT" in the last 72 hours. Sepsis Labs: Recent Labs  Lab 10/27/23 1700 10/27/23 1748  PROCALCITON 0.10  --   LATICACIDVEN  --  1.9    Recent Results (from the past 240 hour(s))  Culture, blood (single) w Reflex to ID Panel     Status: None (Preliminary result)   Collection Time: 10/27/23  5:48 PM   Specimen: BLOOD RIGHT ARM  Result Value Ref Range Status   Specimen Description BLOOD RIGHT ARM  Final   Special Requests   Final    AEROBIC BOTTLE ONLY Blood Culture results may not be optimal due to an inadequate volume of blood received in culture bottles   Culture   Final    NO GROWTH 2 DAYS Performed at [facility], [address], [place], [place] [address]    Report Status PENDING  Incomplete         Radiology Studies: No results found.      Scheduled Meds:  atorvastatin  40 mg Oral Daily   levothyroxine  150 mcg Oral QAC breakfast  metoprolol tartrate  12.5 mg Oral BID   mometasone-formoterol  2 puff Inhalation BID   multivitamin with minerals  1 tablet Oral Daily   pantoprazole (PROTONIX) IV  40 mg Intravenous Q12H   QUEtiapine  12.5 mg Oral QHS   saccharomyces boulardii  250 mg Oral BID   sodium chloride flush  3 mL Intravenous Q12H   Continuous Infusions:  sodium chloride 50 mL/hr at 10/29/23 1249   cefTRIAXone (ROCEPHIN)  IV 2 g (10/29/23 1603)   metronidazole 500 mg (10/30/23 0451)          Glade Lloyd, MD [facility] 10/30/2023, 8:09 AM

## 2023-10-30 NOTE — Plan of Care (Signed)

## 2023-10-31 DIAGNOSIS — Z7189 Other specified counseling: Secondary | ICD-10-CM | POA: Diagnosis not present

## 2023-10-31 DIAGNOSIS — K529 Noninfective gastroenteritis and colitis, unspecified: Secondary | ICD-10-CM | POA: Diagnosis not present

## 2023-10-31 DIAGNOSIS — Z515 Encounter for palliative care: Secondary | ICD-10-CM | POA: Diagnosis not present

## 2023-10-31 LAB — MAGNESIUM: Magnesium: 2 mg/dL (ref 1.7–2.4)

## 2023-10-31 LAB — CBC WITH DIFFERENTIAL/PLATELET
Abs Immature Granulocytes: 0.07 10*3/uL (ref 0.00–0.07)
Basophils Absolute: 0.1 10*3/uL (ref 0.0–0.1)
Basophils Relative: 0 %
Eosinophils Absolute: 0.3 10*3/uL (ref 0.0–0.5)
Eosinophils Relative: 2 %
HCT: 36.9 % (ref 36.0–46.0)
Hemoglobin: 11.6 g/dL — ABNORMAL LOW (ref 12.0–15.0)
Immature Granulocytes: 1 %
Lymphocytes Relative: 10 %
Lymphs Abs: 1.4 10*3/uL (ref 0.7–4.0)
MCH: 30.5 pg (ref 26.0–34.0)
MCHC: 31.4 g/dL (ref 30.0–36.0)
MCV: 97.1 fL (ref 80.0–100.0)
Monocytes Absolute: 0.8 10*3/uL (ref 0.1–1.0)
Monocytes Relative: 6 %
Neutro Abs: 11.6 10*3/uL — ABNORMAL HIGH (ref 1.7–7.7)
Neutrophils Relative %: 81 %
Platelets: 292 10*3/uL (ref 150–400)
RBC: 3.8 MIL/uL — ABNORMAL LOW (ref 3.87–5.11)
RDW: 21.6 % — ABNORMAL HIGH (ref 11.5–15.5)
Smear Review: NORMAL
WBC: 14.3 10*3/uL — ABNORMAL HIGH (ref 4.0–10.5)
nRBC: 0 % (ref 0.0–0.2)

## 2023-10-31 LAB — BASIC METABOLIC PANEL
Anion gap: 10 (ref 5–15)
BUN: 28 mg/dL — ABNORMAL HIGH (ref 8–23)
CO2: 21 mmol/L — ABNORMAL LOW (ref 22–32)
Calcium: 8.3 mg/dL — ABNORMAL LOW (ref 8.9–10.3)
Chloride: 107 mmol/L (ref 98–111)
Creatinine, Ser: 1.02 mg/dL — ABNORMAL HIGH (ref 0.44–1.00)
GFR, Estimated: 51 mL/min — ABNORMAL LOW (ref >60)
Glucose, Bld: 74 mg/dL (ref 70–99)
Potassium: 3.5 mmol/L (ref 3.5–5.1)
Sodium: 138 mmol/L (ref 135–145)

## 2023-10-31 MED ORDER — LORAZEPAM 2 MG/ML PO CONC: 1.0000 mg | ORAL | Status: DC | PRN

## 2023-10-31 MED ORDER — BIOTENE DRY MOUTH MT LIQD: 15.0000 mL | OROMUCOSAL | Status: DC | PRN

## 2023-10-31 MED ORDER — ONDANSETRON HCL 4 MG/2ML IJ SOLN: 4.0000 mg | Freq: Four times a day (QID) | INTRAMUSCULAR | Status: DC | PRN

## 2023-10-31 MED ORDER — GLYCOPYRROLATE 0.2 MG/ML IJ SOLN: 0.2000 mg | INTRAMUSCULAR | Status: DC | PRN

## 2023-10-31 MED ORDER — POLYVINYL ALCOHOL 1.4 % OP SOLN: 1.0000 [drp] | Freq: Four times a day (QID) | OPHTHALMIC | Status: DC | PRN

## 2023-10-31 MED ORDER — ONDANSETRON 4 MG PO TBDP: 4.0000 mg | ORAL_TABLET | Freq: Four times a day (QID) | ORAL | Status: DC | PRN

## 2023-10-31 MED ORDER — GLYCOPYRROLATE 1 MG PO TABS: 1.0000 mg | ORAL_TABLET | ORAL | Status: DC | PRN

## 2023-10-31 MED ORDER — MORPHINE SULFATE (CONCENTRATE) 10 MG /0.5 ML PO SOLN: 10.0000 mg | ORAL | Status: DC | PRN

## 2023-10-31 NOTE — Plan of Care (Signed)
  Problem: Education: Goal: Knowledge of General Education information will improve Description: Including pain rating scale, medication(s)/side effects and non-pharmacologic comfort measures Outcome: Not Progressing   Problem: Health Behavior/Discharge Planning: Goal: Ability to manage health-related needs will improve Outcome: Not Progressing   Problem: Clinical Measurements: Goal: Ability to maintain clinical measurements within normal limits will improve Outcome: Not Progressing Goal: Will remain free from infection Outcome: Not Progressing Goal: Diagnostic test results will improve Outcome: Not Progressing Goal: Respiratory complications will improve Outcome: Not Progressing Goal: Cardiovascular complication will be avoided Outcome: Not Progressing   Problem: Activity: Goal: Risk for activity intolerance will decrease Outcome: Not Progressing   Problem: Nutrition: Goal: Adequate nutrition will be maintained Outcome: Not Progressing   Problem: Coping: Goal: Level of anxiety will decrease Outcome: Not Progressing   Problem: Elimination: Goal: Will not experience complications related to bowel motility Outcome: Not Progressing Goal: Will not experience complications related to urinary retention Outcome: Not Progressing   Problem: Pain Management: Goal: General experience of comfort will improve Outcome: Not Progressing   Problem: Safety: Goal: Ability to remain free from injury will improve Outcome: Not Progressing   Problem: Skin Integrity: Goal: Risk for impaired skin integrity will decrease Outcome: Not Progressing   Problem: Education: Goal: Knowledge of the prescribed therapeutic regimen will improve Outcome: Not Progressing   Problem: Coping: Goal: Ability to identify and develop effective coping behavior will improve Outcome: Not Progressing   Problem: Clinical Measurements: Goal: Quality of life will improve Outcome: Not Progressing   Problem:  Respiratory: Goal: Verbalizations of increased ease of respirations will increase Outcome: Not Progressing   Problem: Role Relationship: Goal: Family's ability to cope with current situation will improve Outcome: Not Progressing Goal: Ability to verbalize concerns, feelings, and thoughts to partner or family member will improve Outcome: Not Progressing   Problem: Pain Management: Goal: Satisfaction with pain management regimen will improve Outcome: Not Progressing  Patient comfort care

## 2023-10-31 NOTE — Progress Notes (Signed)
[facility] 1O10 [facility] Liaison Note   Received request from [facility] for family interest in [facility].   Chart reviewed and spoke with family to discuss hospice philosophy and care at inpatient hospice facility. Approved for [facility] by Dr. Patric Dykes, Hospice provider. Unfortunately, [facility] is not able to offer a room today. Notified Marylene Land, granddaughter, and [facility].  Hospital liaison will follow up tomorrow or sooner if a room becomes available. Please do not hesitate to call with questions.   Glenna Fellows, BSN, RN, [facility] [phone]

## 2023-10-31 NOTE — Care Management Important Message (Signed)
Important Message  Patient Details  Name: Holly [name] MRN: [id number] Date of Birth: August 04, 1928   Important Message Given:  Yes - Medicare IM     Sherilyn Banker 10/31/2023, 3:24 PM

## 2023-10-31 NOTE — TOC Initial Note (Signed)
Transition of Care [facility]) - Initial/Assessment Note    Patient Details  Name: Holly [name] MRN: [id number] Date of Birth: 11-05-1928  Transition of Care [facility]) CM/SW Contact:    Carley Hammed, LCSW Phone Number: 10/31/2023, 10:41 AM  Clinical Narrative:                 CSW advised that pt's family is requesting residential hospice. CSW spoke with granddaughter who confirms interest in [facility]. Referral made and [facility] is reviewing, notified they likely do not have a bed today if pt qualifies. CSW notified team of barriers. TOC will continue to follow for DC needs.   Expected Discharge Plan: Hospice Medical Facility Barriers to Discharge: Continued Medical Work up, Other (must enter comment) (Pending Hospice bed.)   Patient Goals and CMS Choice Patient states their goals for this hospitalization and ongoing recovery are:: Pt unable to particiapte in goal setting. CMS Medicare.gov Compare Post Acute Care list provided to:: Patient Represenative (must comment) Choice offered to / list presented to :  (Granddaughter)      Expected Discharge Plan and Services In-house Referral: Clinical Social Work, Hospice / Palliative Care   Post Acute Care Choice: Residential Hospice Bed Living arrangements for the past 2 months: Skilled Nursing Facility Expected Discharge Date: 10/31/23                                    Prior Living Arrangements/Services Living arrangements for the past 2 months: Skilled Nursing Facility Lives with:: Facility Resident Patient language and need for interpreter reviewed:: Yes Do you feel safe going back to the place where you live?: Yes      Need for Family Participation in Patient Care: Yes (Comment) Care giver support system in place?: Yes (comment)   Criminal Activity/Legal Involvement Pertinent to Current Situation/Hospitalization: No - Comment as needed  Activities of Daily Living   ADL Screening (condition at time of  admission) Independently performs ADLs?: No Does the patient have a NEW difficulty with bathing/dressing/toileting/self-feeding that is expected to last >3 days?: Yes (Initiates electronic notice to provider for possible OT consult) Does the patient have a NEW difficulty with getting in/out of bed, walking, or climbing stairs that is expected to last >3 days?: Yes (Initiates electronic notice to provider for possible PT consult) Does the patient have a NEW difficulty with communication that is expected to last >3 days?: No Is the patient deaf or have difficulty hearing?: Yes Does the patient have difficulty seeing, even when wearing glasses/contacts?: Yes (macular degeneration) Does the patient have difficulty concentrating, remembering, or making decisions?: Yes  Permission Sought/Granted Permission sought to share information with : Family Supports Permission granted to share information with : Yes, Verbal Permission Granted  Share Information with NAME: Marylene Land     Permission granted to share info w Relationship: Granddaughter     Emotional Assessment Appearance:: Appears stated age Attitude/Demeanor/Rapport: Unable to Assess Affect (typically observed): Unable to Assess Orientation: : Oriented to Self Alcohol / Substance Use: Not Applicable Psych Involvement: No (comment)  Admission diagnosis:  Enteritis [K52.9] Gastrointestinal hemorrhage, unspecified gastrointestinal hemorrhage type [K92.2] Patient Active Problem List   Diagnosis Date Noted   Enteritis 10/27/2023   Nausea and vomiting 10/27/2023   Leukocytosis 10/27/2023   Closed left hip fracture (HCC) 09/25/2023   Displaced fracture (avulsion) of lateral epicondyle of left humerus, initial encounter for closed fracture 09/25/2023   Transient  hypotension 09/25/2023   Essential hypertension 09/25/2023   History of COPD 09/25/2023   GAD (generalized anxiety disorder) 09/25/2023   Fall at home, initial encounter 09/25/2023    Hyperkalemia 09/25/2023   CKD stage 3a, GFR 45-59 ml/min (HCC) 09/25/2023   Fall 09/25/2023   Normocytic anemia 09/25/2023   History of CVA (cerebrovascular accident) 09/25/2023   Comminuted left humeral fracture, closed, initial encounter 09/25/2023   Seborrheic keratoses 04/27/2022   Rhinitis 10/10/2020   Closed wedge compression fracture of T11 vertebra (HCC) 05/02/2020   Weight loss 11/19/2019   Mild cognitive impairment 07/06/2019   Leg edema 11/13/2018   Senile purpura (HCC) 09/07/2018   Macular degeneration 09/05/2018   Palpitations 09/05/2018   CVA (cerebral vascular accident) (HCC) 08/30/2018   GERD 12/25/2007   Hypothyroidism 05/18/2007   Hyperlipidemia 05/18/2007   COPD mixed type (HCC) 05/18/2007   PCP:  Ardith Dark, MD Pharmacy:   [id number] - [place] - [address] [place] [address] Phone: [phone] Fax: [phone]  [id number] - [place], [place] - [address] [place] [address] Phone: [phone] Fax: [phone]     Social Determinants of Health (SDOH) Social History: SDOH Screenings   Food Insecurity: No Food Insecurity (10/27/2023)  Housing: Low Risk  (10/27/2023)  Transportation Needs: No Transportation Needs (10/27/2023)  Utilities: Not At Risk (10/27/2023)  Alcohol Screen: Low Risk  (08/09/2022)  Depression (PHQ2-9): Low Risk  (08/15/2023)  Recent Concern: Depression (PHQ2-9) - High Risk (07/20/2023)  Financial Resource Strain: Low Risk  (08/15/2023)  Physical Activity: Insufficiently Active (08/15/2023)  Social Connections: Moderately Integrated (08/15/2023)  Stress: No Stress Concern Present (08/15/2023)  Tobacco Use: Medium Risk (10/27/2023)  Health Literacy: Inadequate Health Literacy (08/15/2023)   SDOH Interventions:     Readmission Risk Interventions     No data to display

## 2023-10-31 NOTE — Progress Notes (Signed)
Palliative Medicine Inpatient Follow Up Note HPI: 89 y.o. female with medical history significant of hyperlipidemia, CHF, CAD, hypothyroidism, COPD, anxiety, GERD and recent hospitalization from 09/25/2023 to 10/01/2023 for left femoral intertrochanteric and left humeral epicondylar fractures treated with cephalomedullary nailing of left intertrochanteric femoral fracture by Dr. Jena Gauss on 09/26/2023 and postoperative course complicated by acute blood loss and acute kidney injury requiring 4 units packed red cells transfusion and subsequent discharge to SNF.  She presented with nausea and vomiting along with dark red blood noted in her stool.  On presentation, WBC was 33, chest x-ray showed bibasilar opacities favoring atelectasis or on differential included pneumonia.  CT of abdomen and pelvis showed findings consistent with enteritis.  She was started on IV fluids and antibiotics.   Palliative care asked to get involved to further support goals of care conversations.   Today's Discussion 10/31/2023  *Please note that this is a verbal dictation therefore any spelling or grammatical errors are due to the "Dragon Medical One" system interpretation.  Chart reviewed inclusive of vital signs, progress notes, laboratory results, and diagnostic images.   Patients granddaughter, Marylene Land called the Palliative phone this morning. She shared that conversations amongst the family were held. The uniform decision among them is to transition Holly [name] to comfort care. We talked about transition to comfort measures in house and what that would entail inclusive of medications to control pain, dyspnea, agitation, nausea, itching, and hiccups.  We discussed stopping all uneccessary measures such as cardiac monitoring, blood draws, needle sticks, and frequent vital signs.   I spoke to [facility] this morning, she was awake to person. She denied pain, shortness of breath, or nausea at that time.   I met with patients daughter at  bedside. She shares that Holly [name] continues to decline food and had a few sips of water which she felt was "not good".   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 They are at peace with this decision.   Reviewed the plan for inpatient hospice referral.   Questions and concerns addressed/Palliative Support Provided.   Objective Assessment: Vital Signs Vitals:   10/31/23 0804 10/31/23 0834  BP: (!) 115/56 (!) 115/56  Pulse: 66 66  Resp: 18   Temp: 97.6 F (36.4 C)   SpO2: 98%    No intake or output data in the 24 hours ending 10/31/23 1323 Last Weight  Most recent update: 10/31/2023  4:10 AM    Weight  54.2 kg (119 lb 7.8 oz)            Gen:  Elderly Caucasian F in NAD HEENT: moist mucous membranes CV: Regular rate and rhythm  PULM: On RA, breathing is even and nonlabored ABD: soft/nontender  EXT: No edema  Neuro: Somnolent  SUMMARY OF RECOMMENDATIONS   DNAR/DNI  Comfort care  Comfort medications per [facility]  Unrestricted visitation  Appreciate TOC helping with hospice home referrals  PMT will continue to offer support  Billing based on MDM: High ______________________________________________________________________________________ Lamarr Lulas [place] Palliative Medicine Team Team Cell Phone: [phone] Please utilize secure chat with additional questions, if there is no response within 30 minutes please call the above phone number  Palliative Medicine Team providers are available by phone from 7am to 7pm daily and can be reached through the team cell phone.  Should this patient require assistance  outside of these hours, please call the patient's attending physician.

## 2023-10-31 NOTE — Discharge Summary (Signed)
Physician Discharge Summary  Holly [name] DDU:[id number] DOB: [birth year] DOA: 10/27/2023  PCP: Ardith Dark, MD  Admit date: 10/27/2023 Discharge date: 10/31/2023  Admitted From: SNF Disposition: Residential hospice  Recommendations for Outpatient Follow-up:  Follow up with residential hospice provider at earliest convenience   Home Health: No Equipment/Devices: None  Discharge Condition: Poor CODE STATUS: DNR Diet recommendation: Comfort measures  Brief/Interim Summary: 89 y.o. female with medical history significant of hyperlipidemia, CHF, CAD, hypothyroidism, COPD, anxiety, GERD and recent hospitalization from 09/25/2023 to 10/01/2023 for left femoral intertrochanteric and left humeral epicondylar fractures treated with cephalomedullary nailing of left intertrochanteric femoral fracture by Dr. Jena Gauss on 09/26/2023 and postoperative course complicated by acute blood loss and acute kidney injury requiring 4 units packed red cells transfusion and subsequent discharge to SNF.  She presented with nausea and vomiting along with dark red blood noted in her stool.  On presentation, WBC was 33, chest x-ray showed bibasilar opacities favoring atelectasis or on differential included pneumonia.  CT of abdomen and pelvis showed findings consistent with enteritis.  She was started on IV fluids and antibiotics.  During the hospitalization, her oral intake did not improve.  Her condition did not improve.  After discussion with palliative care team, family decided to pursue comfort measures and residential hospice.  She will be discharged to residential hospice once bed is available.  Discharge Diagnoses:   Comfort measures only status Enteritis with rectal bleeding and diarrhea  Nausea and vomiting  Leukocytosis  Essential hypertension Hyperlipidemia COPD Recent history of left femoral intertrochanteric fracture and left humeral epicondylar fracture due to fall at home  History of unspecified  stroke History of NSVT Hypothyroidism Anxiety disorder Moderate protein calorie malnutrition  Plan -As discussed above, she will be discharged to residential hospice once bed is available.    Discharge Instructions  Discharge Instructions     No wound care   Complete by: As directed       Allergies as of 10/31/2023       Reactions   Levofloxacin    Unknown reaction   Macrobid [nitrofurantoin Macrocrystal] Nausea Only   Sulfamethoxazole    Unknown reaction   Tramadol Other (See Comments)   Shakes   Amoxicillin Rash   Has patient had a PCN reaction causing immediate rash, facial/tongue/throat swelling, SOB or lightheadedness with hypotension: No Has patient had a PCN reaction causing severe rash involving mucus membranes or skin necrosis: No Has patient had a PCN reaction that required hospitalization: No Has patient had a PCN reaction occurring within the last 10 years: No If all of the above answers are "NO", then may proceed with Cephalosporin use. Made "bottom area" very raw        Medication List     STOP taking these medications    acetaminophen 500 MG tablet Commonly known as: TYLENOL   albuterol 108 (90 Base) MCG/ACT inhaler Commonly known as: VENTOLIN HFA   aspirin EC 81 MG tablet   atorvastatin 40 MG tablet Commonly known as: LIPITOR   budesonide-formoterol 160-4.5 MCG/ACT inhaler Commonly known as: SYMBICORT   ICAPS PO   levothyroxine 112 MCG tablet Commonly known as: SYNTHROID   levothyroxine 150 MCG tablet Commonly known as: SYNTHROID   loperamide 2 MG tablet Commonly known as: IMODIUM A-D   Melatonin 10 MG Tabs   metoprolol tartrate 25 MG tablet Commonly known as: LOPRESSOR   oxyCODONE 5 MG immediate release tablet Commonly known as: Oxy IR/ROXICODONE   polyethylene glycol 17 g  packet Commonly known as: MIRALAX / GLYCOLAX   promethazine 25 MG tablet Commonly known as: PHENERGAN   senna-docusate 8.6-50 MG tablet Commonly  known as: Senokot-S   vitamin D3 25 MCG tablet Commonly known as: CHOLECALCIFEROL       TAKE these medications    glycopyrrolate 1 MG tablet Commonly known as: ROBINUL Take 1 tablet (1 mg total) by mouth every 4 (four) hours as needed (excessive secretions).   LORazepam 2 MG/ML concentrated solution Commonly known as: ATIVAN Take 0.5 mLs (1 mg total) by mouth every hour as needed for anxiety, seizure, sedation or sleep.   morphine CONCENTRATE 10 mg / 0.5 ml concentrated solution Take 0.5 mLs (10 mg total) by mouth every hour as needed (Dyspnea/Pain).   ondansetron 4 MG tablet Commonly known as: ZOFRAN Take 4 mg by mouth every 4 (four) hours as needed for nausea or vomiting.   polyvinyl alcohol 1.4 % ophthalmic solution Commonly known as: LIQUIFILM TEARS Place 1 drop into both eyes 4 (four) times daily as needed for dry eyes.   QUEtiapine 25 MG tablet Commonly known as: SEROQUEL Take 12.5 mg by mouth at bedtime.        Allergies  Allergen Reactions   Levofloxacin     Unknown reaction   Macrobid [Nitrofurantoin Macrocrystal] Nausea Only   Sulfamethoxazole     Unknown reaction   Tramadol Other (See Comments)    Shakes    Amoxicillin Rash    Has patient had a PCN reaction causing immediate rash, facial/tongue/throat swelling, SOB or lightheadedness with hypotension: No Has patient had a PCN reaction causing severe rash involving mucus membranes or skin necrosis: No Has patient had a PCN reaction that required hospitalization: No Has patient had a PCN reaction occurring within the last 10 years: No If all of the above answers are "NO", then may proceed with Cephalosporin use.   Made "bottom area" very raw    Consultations: Palliative care   Procedures/Studies: CT ABDOMEN PELVIS W CONTRAST  Result Date: 10/27/2023 CLINICAL DATA:  Acute non localized abdominal pain. EXAM: CT ABDOMEN AND PELVIS WITH CONTRAST TECHNIQUE: Multidetector CT imaging of the abdomen  and pelvis was performed using the standard protocol following bolus administration of intravenous contrast. RADIATION DOSE REDUCTION: This exam was performed according to the departmental dose-optimization program which includes automated exposure control, adjustment of the mA and/or kV according to patient size and/or use of iterative reconstruction technique. CONTRAST:  75mL OMNIPAQUE IOHEXOL 350 MG/ML SOLN COMPARISON:  Noncontrast CT on 09/25/2023 FINDINGS: Lower Chest: New small bilateral pleural effusions and dependent atelectasis. Hepatobiliary: No suspicious hepatic masses identified. Gallbladder is unremarkable. No evidence of biliary ductal dilatation. Pancreas:  No mass or inflammatory changes. Spleen: Within normal limits in size and appearance. Adrenals/Urinary Tract: No suspicious masses identified. No evidence of ureteral calculi or hydronephrosis. Stomach/Bowel: Moderate wall thickening is seen involving multiple distal small bowel loops in the pelvis. No No evidence bowel obstruction Diverticulosis is seen mainly involving the sigmoid colon, however there is no evidence of diverticulitis. Diffuse mesenteric and body wall edema also noted. Vascular/Lymphatic: No pathologically enlarged lymph nodes. No acute vascular findings. Reproductive:  No mass or other significant abnormality. Other:  None. Musculoskeletal: No suspicious bone lesions identified. Healing insufficiency fracture is seen involving the left sacral ala. Several healing, mildly displaced fractures are seen involving the left superior and inferior pubic rami. Internal fixation hardware noted in the left hip. Old T11 vertebroplasty also noted. IMPRESSION: Moderate wall thickening involving multiple  distal small bowel loops in the pelvis, consistent with enteritis. Diffuse mesenteric and body wall edema. New small bilateral pleural effusions and dependent atelectasis. Healing fractures involving the left sacral ala and left pubic rami.  Electronically Signed   By: Danae Orleans M.D.   On: 10/27/2023 15:08   DG Chest Portable 1 View  Result Date: 10/27/2023 CLINICAL DATA:  Altered mental status. EXAM: PORTABLE CHEST 1 VIEW COMPARISON:  09/25/2023 and older studies. FINDINGS: Cardiac silhouette is normal in size. No mediastinal or hilar masses. Lungs are hyperexpanded. Bilateral interstitial thickening, similar to the prior exam. Additional opacity noted at the lung bases suspected to be atelectasis. Consider pneumonia in the proper clinical setting. No convincing pleural effusion.  No pneumothorax. Skeletal structures are demineralized, grossly intact. IMPRESSION: 1. Chronic interstitial thickening.  Stable lung hyperexpansion. 2. Bibasilar opacity, favor atelectasis. Consider pneumonia in the proper clinical setting. Electronically Signed   By: Amie Portland M.D.   On: 10/27/2023 12:47      Subjective: Patient seen and examined at bedside.  Extremely poor historian.  No seizures, vomiting or agitation reported.  Oral intake remains poor.   Discharge Exam: Vitals:   10/31/23 0804 10/31/23 0834  BP: (!) 115/56 (!) 115/56  Pulse: 66 66  Resp: 18   Temp: 97.6 F (36.4 C)   SpO2: 98%     General: Pt is a poor historian.  Still very slow to respond.  On room air.  Looks chronically ill and deconditioned.  Wakes up only very slightly.  Elderly female lying in bed. Cardiovascular: rate controlled, S1/S2 + Respiratory: bilateral decreased breath sounds at bases with scattered crackles Abdominal: Soft, NT, ND, bowel sounds + Extremities: Trace lower extremity edema; no cyanosis    The results of significant diagnostics from this hospitalization (including imaging, microbiology, ancillary and laboratory) are listed below for reference.     Microbiology: Recent Results (from the past 240 hour(s))  Culture, blood (single) w Reflex to ID Panel     Status: None (Preliminary result)   Collection Time: 10/27/23  5:48 PM    Specimen: BLOOD RIGHT ARM  Result Value Ref Range Status   Specimen Description BLOOD RIGHT ARM  Final   Special Requests   Final    AEROBIC BOTTLE ONLY Blood Culture results may not be optimal due to an inadequate volume of blood received in culture bottles   Culture   Final    NO GROWTH 4 DAYS Performed at [facility], [address], [place], [place] [address]    Report Status PENDING  Incomplete     Labs: BNP (last 3 results) Recent Labs    10/27/23 1348  BNP 588.9*   Basic Metabolic Panel: Recent Labs  Lab 10/27/23 1348 10/28/23 0505 10/29/23 0553 10/30/23 0822 10/31/23 0833  NA 140 140 139 139 138  K 3.8 4.7 3.8 3.6 3.5  CL 110 107 106 106 107  CO2 19* 23 19* 22 21*  GLUCOSE 110* 79 81 72 74  BUN 29* 33* 30* 31* 28*  CREATININE 0.72 0.80 1.06* 1.35* 1.02*  CALCIUM 7.5* 8.1* 8.3* 8.4* 8.3*  MG  --   --  1.7 1.9 2.0   Liver Function Tests: Recent Labs  Lab 10/27/23 1348 10/28/23 0505 10/29/23 0553  AST 41 33 27  ALT 10 6 8   ALKPHOS 128* 101 136*  BILITOT 1.5* 1.2* 1.3*  PROT 5.4* 4.5* 5.7*  ALBUMIN 2.0* 1.8* 2.0*   No results for input(s): "LIPASE", "  AMYLASE" in the last 168 hours. No results for input(s): "AMMONIA" in the last 168 hours. CBC: Recent Labs  Lab 10/27/23 1348 10/27/23 1748 10/28/23 1041 10/29/23 0240 10/30/23 0822 10/31/23 0833  WBC 33.0*  --  28.2* 22.6* 17.0* 14.3*  NEUTROABS 30.7*  --   --  19.8* 14.3* PENDING  HGB 13.6 14.6 12.6 12.7 11.7* 11.6*  HCT 42.7 45.1 39.3 41.0 36.5 36.9  MCV 98.2  --  96.6 99.3 97.1 97.1  PLT 311  --  294 299 271 292   Cardiac Enzymes: No results for input(s): "CKTOTAL", "CKMB", "CKMBINDEX", "TROPONINI" in the last 168 hours. BNP: Invalid input(s): "POCBNP" CBG: No results for input(s): "GLUCAP" in the last 168 hours. D-Dimer No results for input(s): "DDIMER" in the last 72 hours. Hgb A1c No results for input(s): "HGBA1C" in the last 72 hours. Lipid Profile No results for input(s):  "CHOL", "HDL", "LDLCALC", "TRIG", "CHOLHDL", "LDLDIRECT" in the last 72 hours. Thyroid function studies No results for input(s): "TSH", "T4TOTAL", "T3FREE", "THYROIDAB" in the last 72 hours.  Invalid input(s): "FREET3" Anemia work up No results for input(s): "VITAMINB12", "FOLATE", "FERRITIN", "TIBC", "IRON", "RETICCTPCT" in the last 72 hours. Urinalysis    Component Value Date/Time   COLORURINE YELLOW 04/16/2022 1517   APPEARANCEUR CLEAR 04/16/2022 1517   LABSPEC 1.010 04/16/2022 1517   PHURINE 6.0 04/16/2022 1517   GLUCOSEU NEGATIVE 04/16/2022 1517   HGBUR NEGATIVE 04/16/2022 1517   HGBUR trace-lysed 12/15/2007 0858   BILIRUBINUR NEGATIVE 04/16/2022 1517   BILIRUBINUR negative 06/05/2020 1413   KETONESUR NEGATIVE 04/16/2022 1517   PROTEINUR NEGATIVE 04/16/2022 1517   UROBILINOGEN 1.0 06/05/2020 1413   UROBILINOGEN 1.0 08/27/2011 1257   NITRITE NEGATIVE 04/16/2022 1517   LEUKOCYTESUR TRACE (A) 04/16/2022 1517   Sepsis Labs Recent Labs  Lab 10/28/23 1041 10/29/23 0240 10/30/23 0822 10/31/23 0833  WBC 28.2* 22.6* 17.0* 14.3*   Microbiology Recent Results (from the past 240 hour(s))  Culture, blood (single) w Reflex to ID Panel     Status: None (Preliminary result)   Collection Time: 10/27/23  5:48 PM   Specimen: BLOOD RIGHT ARM  Result Value Ref Range Status   Specimen Description BLOOD RIGHT ARM  Final   Special Requests   Final    AEROBIC BOTTLE ONLY Blood Culture results may not be optimal due to an inadequate volume of blood received in culture bottles   Culture   Final    NO GROWTH 4 DAYS Performed at [facility], [address] [place], [place] [address]    Report Status PENDING  Incomplete     Time coordinating discharge: 35 minutes  SIGNED:   Glade Lloyd, MD  [facility] 10/31/2023, 10:01 AM

## 2023-11-01 ENCOUNTER — Other Ambulatory Visit: Payer: Self-pay

## 2023-11-01 DIAGNOSIS — Z515 Encounter for palliative care: Secondary | ICD-10-CM | POA: Diagnosis not present

## 2023-11-01 DIAGNOSIS — E440 Moderate protein-calorie malnutrition: Secondary | ICD-10-CM | POA: Insufficient documentation

## 2023-11-01 LAB — CULTURE, BLOOD (SINGLE): Culture: NO GROWTH

## 2023-11-01 NOTE — Progress Notes (Signed)
   Palliative Medicine Inpatient Follow Up Note HPI: 89 y.o. female with medical history significant of hyperlipidemia, CHF, CAD, hypothyroidism, COPD, anxiety, GERD and recent hospitalization from 09/25/2023 to 10/01/2023 for left femoral intertrochanteric and left humeral epicondylar fractures treated with cephalomedullary nailing of left intertrochanteric femoral fracture by Dr. Jena Gauss on 09/26/2023 and postoperative course complicated by acute blood loss and acute kidney injury requiring 4 units packed red cells transfusion and subsequent discharge to SNF.  She presented with nausea and vomiting along with dark red blood noted in her stool.  On presentation, WBC was 33, chest x-ray showed bibasilar opacities favoring atelectasis or on differential included pneumonia.  CT of abdomen and pelvis showed findings consistent with enteritis.  She was started on IV fluids and antibiotics.   Palliative care asked to get involved to further support goals of care conversations.   Today's Discussion 11/01/2023  *Please note that this is a verbal dictation therefore any spelling or grammatical errors are due to the "Dragon Medical One" system interpretation.  Chart reviewed inclusive of vital signs, progress notes, laboratory results, and diagnostic images. Has required two doses of oxycodone 5mg  over the past 24 hours.   I met with Holly [name] this morning. She is sleeping soundly in NAD.   Plan for patient to transition to hospice home [facility] through [facility] this morning.   Questions and concerns addressed/Palliative Support Provided.   Objective Assessment: Vital Signs Vitals:   10/31/23 1459 11/01/23 0608  BP: (!) 109/50 (!) 113/50  Pulse: 64 69  Resp: 18 18  Temp: 98.5 F (36.9 C) 98.1 F (36.7 C)  SpO2: 97% 96%    Intake/Output Summary (Last 24 hours) at 11/01/2023 1137 Last data filed at 10/31/2023 2100 Gross per 24 hour  Intake 10 ml  Output 500 ml  Net -490 ml   Last Weight   Most recent update: 10/31/2023  4:10 AM    Weight  54.2 kg (119 lb 7.8 oz)            Gen:  Elderly Caucasian F in NAD HEENT: moist mucous membranes CV: Regular rate and rhythm  PULM: On RA, breathing is even and nonlabored ABD: soft/nontender  EXT: No edema  Neuro: Somnolent  SUMMARY OF RECOMMENDATIONS   DNAR/DNI  Comfort care  Comfort medications per [facility]  Unrestricted visitation  Plan for Holly [name] transfer this afternoon  Time: 25 ______________________________________________________________________________________ Holly [name] Palliative Medicine Team Team Cell Phone: [phone] Please utilize secure chat with additional questions, if there is no response within 30 minutes please call the above phone number  Palliative Medicine Team providers are available by phone from 7am to 7pm daily and can be reached through the team cell phone.  Should this patient require assistance outside of these hours, please call the patient's attending physician.

## 2023-11-01 NOTE — TOC Transition Note (Signed)
Transition of Care [facility]) - CM/SW Discharge Note   Patient Details  Name: Holly [name] MRN: [id number] Date of Birth: Sep 17, 1928  Transition of Care [facility]) CM/SW Contact:  Carley Hammed, LCSW Phone Number: 11/01/2023, 2:20 PM   Clinical Narrative:     Pt to be transported to [facility] via [place]. Nurse to call report to [phone]   Final next level of care: Hospice Medical Facility Barriers to Discharge: Barriers Resolved   Patient Goals and CMS Choice CMS Medicare.gov Compare Post Acute Care list provided to:: Patient Represenative (must comment) Choice offered to / list presented to :  (Granddaughter)  Discharge Placement                Patient chooses bed at:  [facility]) Patient to be transferred to facility by: [facility] Name of family member notified: Marylene Land Patient and family notified of of transfer: 11/01/23  Discharge Plan and Services Additional resources added to the After Visit Summary for   In-house Referral: Clinical Social Work, Hospice / Palliative Care   Post Acute Care Choice: Residential Hospice Bed                               Social Determinants of Health (SDOH) Interventions SDOH Screenings   Food Insecurity: No Food Insecurity (10/27/2023)  Housing: Low Risk  (10/27/2023)  Transportation Needs: No Transportation Needs (10/27/2023)  Utilities: Not At Risk (10/27/2023)  Alcohol Screen: Low Risk  (08/09/2022)  Depression (PHQ2-9): Low Risk  (08/15/2023)  Recent Concern: Depression (PHQ2-9) - High Risk (07/20/2023)  Financial Resource Strain: Low Risk  (08/15/2023)  Physical Activity: Insufficiently Active (08/15/2023)  Social Connections: Moderately Integrated (08/15/2023)  Stress: No Stress Concern Present (08/15/2023)  Tobacco Use: Medium Risk (10/27/2023)  Health Literacy: Inadequate Health Literacy (08/15/2023)     Readmission Risk Interventions     No data to display

## 2023-11-01 NOTE — Progress Notes (Signed)
Pt discharged to [facility] via [facility] with all belongings and paperwork

## 2023-11-01 NOTE — TOC Progression Note (Signed)
Transition of Care [facility]) - Progression Note    Patient Details  Name: Holly [name] MRN: [id number] Date of Birth: [birth year]  Transition of Care [facility]) CM/SW Contact  Carley Hammed, LCSW Phone Number: 11/01/2023, 10:59 AM  Clinical Narrative:    CSW followed up with [facility] rep to request update on bed availability. Per Ines Bloomer, a bed is available today and documentation is currently being signed by family. CSW will set up for ambulance transport and continue to assist with DC needs.   Expected Discharge Plan: Hospice Medical Facility Barriers to Discharge: Continued Medical Work up, Other (must enter comment) (Pending Hospice bed.)  Expected Discharge Plan and Services In-house Referral: Clinical Social Work, Hospice / Palliative Care   Post Acute Care Choice: Residential Hospice Bed Living arrangements for the past 2 months: Skilled Nursing Facility Expected Discharge Date: 10/31/23                                     Social Determinants of Health (SDOH) Interventions SDOH Screenings   Food Insecurity: No Food Insecurity (10/27/2023)  Housing: Low Risk  (10/27/2023)  Transportation Needs: No Transportation Needs (10/27/2023)  Utilities: Not At Risk (10/27/2023)  Alcohol Screen: Low Risk  (08/09/2022)  Depression (PHQ2-9): Low Risk  (08/15/2023)  Recent Concern: Depression (PHQ2-9) - High Risk (07/20/2023)  Financial Resource Strain: Low Risk  (08/15/2023)  Physical Activity: Insufficiently Active (08/15/2023)  Social Connections: Moderately Integrated (08/15/2023)  Stress: No Stress Concern Present (08/15/2023)  Tobacco Use: Medium Risk (10/27/2023)  Health Literacy: Inadequate Health Literacy (08/15/2023)    Readmission Risk Interventions     No data to display

## 2023-11-01 NOTE — Progress Notes (Signed)
Patient is waiting to be discharged to residential hospice.  Please refer to the full discharge summary done by me on 10/31/2023 for full details.  Continue comfort measures.

## 2023-11-01 NOTE — Consult Note (Signed)
[facility] Liaison Consult Note    11/01/2023  Holly [name] 1928/03/28 [id number]  Insurance: Medicare [employer]  Primary Care Provider: Ardith Dark, MD with [place] at [facility] which is listed to provide the transition of care follow up and calls.   Chart reviewed  for less than 30 day readmission, 4 LOS, as reviewed from [facility] and Palliative consult notes and reveals the patient is currently transitioning to Hospice care to a Hospice facility. Patient currently under comfort measures.  Plan: Patient will have full care coordination services through Hospice and needs will be met at the hospice level of care. No planned follow up for transitional needs. Will sign off at transition from hospital.  For questions,  .  Charlesetta Shanks, RN, BSN, CCM [employer], [facility]  [facility] Liaison Direct Dial: [phone] or secure chat Email: [email or web address]

## 2023-11-01 NOTE — Progress Notes (Signed)
Report called to [facility] [phone]. spoke with nurse Baird Lyons who will be taking care of pt there once she arrives. Nurse verbalized understanding of report and had no further questions.

## 2023-11-30 DEATH — deceased
